# Patient Record
Sex: Male | Born: 1975 | Race: Black or African American | Hispanic: No | Marital: Single
Health system: Midwestern US, Community
[De-identification: ages and names within clinical notes are randomized; demographics above are authoritative.]

## PROBLEM LIST (undated history)

## (undated) DIAGNOSIS — F200 Paranoid schizophrenia: Secondary | ICD-10-CM

## (undated) DIAGNOSIS — G2401 Drug induced subacute dyskinesia: Secondary | ICD-10-CM

## (undated) DIAGNOSIS — Z720 Tobacco use: Secondary | ICD-10-CM

## (undated) DIAGNOSIS — F1210 Cannabis abuse, uncomplicated: Secondary | ICD-10-CM

## (undated) HISTORY — PX: NO PAST SURGERIES: SHX2092

---

## 2014-12-21 ENCOUNTER — Emergency Department: Payer: Self-pay | Admitting: Emergency Medicine

## 2014-12-21 LAB — URINALYSIS, COMPLETE
BACTERIA: NONE SEEN
BILIRUBIN, UR: NEGATIVE
Blood: NEGATIVE
Glucose,UR: NEGATIVE mg/dL (ref 0–75)
KETONE: NEGATIVE
LEUKOCYTE ESTERASE: NEGATIVE
Nitrite: NEGATIVE
Ph: 7 (ref 4.5–8.0)
Protein: NEGATIVE
RBC, UR: NONE SEEN /HPF (ref 0–5)
Specific Gravity: 1.012 (ref 1.003–1.030)
Squamous Epithelial: 1
WBC UR: NONE SEEN /HPF (ref 0–5)

## 2014-12-21 LAB — CBC WITH DIFFERENTIAL/PLATELET
BASOS ABS: 0 10*3/uL (ref 0.0–0.1)
BASOS PCT: 0.7 %
Eosinophil #: 0.1 10*3/uL (ref 0.0–0.7)
Eosinophil %: 2.3 %
HCT: 40.1 % (ref 40.0–52.0)
HGB: 12.6 g/dL — ABNORMAL LOW (ref 13.0–18.0)
Lymphocyte #: 1.7 10*3/uL (ref 1.0–3.6)
Lymphocyte %: 53.7 %
MCH: 28.8 pg (ref 26.0–34.0)
MCHC: 31.5 g/dL — AB (ref 32.0–36.0)
MCV: 92 fL (ref 80–100)
Monocyte #: 0.3 x10 3/mm (ref 0.2–1.0)
Monocyte %: 9.7 %
NEUTROS PCT: 33.6 %
Neutrophil #: 1.1 10*3/uL — ABNORMAL LOW (ref 1.4–6.5)
Platelet: 201 10*3/uL (ref 150–440)
RBC: 4.37 10*6/uL — AB (ref 4.40–5.90)
RDW: 14.8 % — ABNORMAL HIGH (ref 11.5–14.5)
WBC: 3.2 10*3/uL — ABNORMAL LOW (ref 3.8–10.6)

## 2014-12-21 LAB — COMPREHENSIVE METABOLIC PANEL
ANION GAP: 5 — AB (ref 7–16)
Albumin: 3.9 g/dL (ref 3.4–5.0)
Alkaline Phosphatase: 47 U/L
BUN: 12 mg/dL (ref 7–18)
Bilirubin,Total: 0.4 mg/dL (ref 0.2–1.0)
CALCIUM: 9 mg/dL (ref 8.5–10.1)
CO2: 33 mmol/L — AB (ref 21–32)
Chloride: 102 mmol/L (ref 98–107)
Creatinine: 1.08 mg/dL (ref 0.60–1.30)
EGFR (African American): >60
EGFR (Non-African Amer.): >60
Glucose: 105 mg/dL — ABNORMAL HIGH (ref 65–99)
Osmolality: 280 (ref 275–301)
Potassium: 3.7 mmol/L (ref 3.5–5.1)
SGOT(AST): 35 U/L (ref 15–37)
SGPT (ALT): 26 U/L
SODIUM: 140 mmol/L (ref 136–145)
TOTAL PROTEIN: 7.9 g/dL (ref 6.4–8.2)

## 2015-03-10 ENCOUNTER — Ambulatory Visit
Admit: 2015-03-10 | Disposition: A | Discharge status: Home / Self Care | Payer: Self-pay | Attending: Internal Medicine | Admitting: Internal Medicine

## 2015-03-26 ENCOUNTER — Ambulatory Visit
Admit: 2015-03-26 | Disposition: A | Discharge status: Home / Self Care | Payer: Self-pay | Attending: Internal Medicine | Admitting: Internal Medicine

## 2015-09-13 ENCOUNTER — Encounter: Payer: Self-pay | Admitting: Emergency Medicine

## 2015-09-13 ENCOUNTER — Emergency Department
Admission: EM | Admit: 2015-09-13 | Discharge: 2015-09-14 | Disposition: A | Discharge status: Other Acute Inpatient Hospital | Payer: Medicare Other | Attending: Emergency Medicine | Admitting: Emergency Medicine

## 2015-09-13 DIAGNOSIS — F17200 Nicotine dependence, unspecified, uncomplicated: Secondary | ICD-10-CM

## 2015-09-13 DIAGNOSIS — F22 Delusional disorders: Secondary | ICD-10-CM

## 2015-09-13 DIAGNOSIS — F200 Paranoid schizophrenia: Secondary | ICD-10-CM | POA: Diagnosis not present

## 2015-09-13 DIAGNOSIS — Z046 Encounter for general psychiatric examination, requested by authority: Secondary | ICD-10-CM | POA: Diagnosis present

## 2015-09-13 DIAGNOSIS — F1220 Cannabis dependence, uncomplicated: Secondary | ICD-10-CM

## 2015-09-13 DIAGNOSIS — Z720 Tobacco use: Secondary | ICD-10-CM | POA: Insufficient documentation

## 2015-09-13 HISTORY — DX: Paranoid schizophrenia: F20.0

## 2015-09-13 LAB — COMPREHENSIVE METABOLIC PANEL
ALBUMIN: 4.2 g/dL (ref 3.5–5.0)
ALT: 16 U/L — AB (ref 17–63)
AST: 40 U/L (ref 15–41)
Alkaline Phosphatase: 44 U/L (ref 38–126)
Anion gap: 8 (ref 5–15)
BILIRUBIN TOTAL: 0.6 mg/dL (ref 0.3–1.2)
BUN: 13 mg/dL (ref 6–20)
CHLORIDE: 104 mmol/L (ref 101–111)
CO2: 27 mmol/L (ref 22–32)
CREATININE: 0.99 mg/dL (ref 0.61–1.24)
Calcium: 9.4 mg/dL (ref 8.9–10.3)
GFR calc Af Amer: >60 mL/min (ref >60)
GLUCOSE: 101 mg/dL — AB (ref 65–99)
POTASSIUM: 3.8 mmol/L (ref 3.5–5.1)
Sodium: 139 mmol/L (ref 135–145)
TOTAL PROTEIN: 7.9 g/dL (ref 6.5–8.1)

## 2015-09-13 LAB — CBC
HEMATOCRIT: 38.3 % — AB (ref 40.0–52.0)
Hemoglobin: 12.5 g/dL — ABNORMAL LOW (ref 13.0–18.0)
MCH: 29.4 pg (ref 26.0–34.0)
MCHC: 32.7 g/dL (ref 32.0–36.0)
MCV: 89.9 fL (ref 80.0–100.0)
PLATELETS: 241 10*3/uL (ref 150–440)
RBC: 4.26 MIL/uL — AB (ref 4.40–5.90)
RDW: 14.6 % — ABNORMAL HIGH (ref 11.5–14.5)
WBC: 4.6 10*3/uL (ref 3.8–10.6)

## 2015-09-13 LAB — ACETAMINOPHEN LEVEL: Acetaminophen (Tylenol), Serum: <10 ug/mL — ABNORMAL LOW (ref 10–30)

## 2015-09-13 LAB — SALICYLATE LEVEL: Salicylate Lvl: <4 mg/dL (ref 2.8–30.0)

## 2015-09-13 LAB — ETHANOL

## 2015-09-13 NOTE — ED Notes (Signed)
Patient assigned to appropriate care area. Patient oriented to unit/care area: Informed that, for their safety, care areas are designed for safety and monitored by security cameras at all times; and visiting hours explained to patient. Patient verbalizes understanding, and verbal contract for safety obtained.  BEHAVIORAL HEALTH ROUNDING Patient sleeping: No. Patient alert and oriented: yes Behavior appropriate: Yes.   Nutrition and fluids offered: Yes  Toileting and hygiene offered: Yes  Sitter present: q15 min observations Law enforcement present: Yes [employer]  ENVIRONMENTAL ASSESSMENT Potentially harmful objects out of patient reach: Yes.   Personal belongings secured: Yes.   Patient dressed in hospital provided attire only: Yes.   Plastic bags out of patient reach: Yes.   Patient care equipment (cords, cables, call bells, lines, and drains) shortened, removed, or accounted for: Yes.   Equipment and supplies removed from bottom of stretcher: Yes.   Potentially toxic materials out of patient reach: Yes.   Sharps container removed or out of patient reach: Yes.   

## 2015-09-13 NOTE — ED Notes (Signed)
ED tech, Vernona Rieger in triage for protocols

## 2015-09-13 NOTE — ED Notes (Signed)
Per pt he came to the ER tonight "because the group home didn't give me my meds and I am feeling paranoid". Pt does report hearing voices but would not explain what the voices were saying. Pt being belligerent with this nurse while trying to do assessment. Pt went between " I have a bad migraine headache and the group home didn't give me my meds" Pt denies SI, HI, VH, ETOH use. Pt does states " I do smoke some weed".
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
## 2015-09-14 ENCOUNTER — Inpatient Hospital Stay
Admission: EM | Admit: 2015-09-14 | Discharge: 2015-09-20 | DRG: 885 | Disposition: A | Discharge status: Home / Self Care | Payer: Medicare Other | Source: Intra-hospital | Attending: Psychiatry | Admitting: Psychiatry

## 2015-09-14 ENCOUNTER — Encounter: Payer: Self-pay | Admitting: Psychiatry

## 2015-09-14 DIAGNOSIS — Z818 Family history of other mental and behavioral disorders: Secondary | ICD-10-CM | POA: Diagnosis not present

## 2015-09-14 DIAGNOSIS — F22 Delusional disorders: Secondary | ICD-10-CM | POA: Diagnosis present

## 2015-09-14 DIAGNOSIS — F17200 Nicotine dependence, unspecified, uncomplicated: Secondary | ICD-10-CM

## 2015-09-14 DIAGNOSIS — E780 Pure hypercholesterolemia: Secondary | ICD-10-CM | POA: Diagnosis present

## 2015-09-14 DIAGNOSIS — G4700 Insomnia, unspecified: Secondary | ICD-10-CM | POA: Diagnosis present

## 2015-09-14 DIAGNOSIS — F200 Paranoid schizophrenia: Secondary | ICD-10-CM | POA: Diagnosis present

## 2015-09-14 DIAGNOSIS — F17210 Nicotine dependence, cigarettes, uncomplicated: Secondary | ICD-10-CM | POA: Diagnosis present

## 2015-09-14 DIAGNOSIS — R451 Restlessness and agitation: Secondary | ICD-10-CM | POA: Diagnosis present

## 2015-09-14 DIAGNOSIS — R45850 Homicidal ideations: Secondary | ICD-10-CM | POA: Diagnosis present

## 2015-09-14 DIAGNOSIS — F1290 Cannabis use, unspecified, uncomplicated: Secondary | ICD-10-CM | POA: Diagnosis present

## 2015-09-14 DIAGNOSIS — F1220 Cannabis dependence, uncomplicated: Secondary | ICD-10-CM

## 2015-09-14 DIAGNOSIS — Z72811 Adult antisocial behavior: Secondary | ICD-10-CM | POA: Diagnosis not present

## 2015-09-14 DIAGNOSIS — F602 Antisocial personality disorder: Secondary | ICD-10-CM

## 2015-09-14 LAB — URINE DRUG SCREEN, QUALITATIVE (ARMC ONLY)
Amphetamines, Ur Screen: NOT DETECTED
BARBITURATES, UR SCREEN: NOT DETECTED
Benzodiazepine, Ur Scrn: NOT DETECTED
CANNABINOID 50 NG, UR ~~LOC~~: NOT DETECTED
COCAINE METABOLITE, UR ~~LOC~~: NOT DETECTED
MDMA (Ecstasy)Ur Screen: NOT DETECTED
Methadone Scn, Ur: NOT DETECTED
Opiate, Ur Screen: NOT DETECTED
PHENCYCLIDINE (PCP) UR S: NOT DETECTED
TRICYCLIC, UR SCREEN: NOT DETECTED

## 2015-09-14 MED ORDER — NICOTINE 21 MG/24HR TD PT24
21.0000 mg | MEDICATED_PATCH | Freq: Every day | TRANSDERMAL | Status: DC
  Administered 2015-09-14: 21 mg via TRANSDERMAL
  Filled 2015-09-14: qty 1

## 2015-09-14 MED ORDER — MAGNESIUM HYDROXIDE 400 MG/5ML PO SUSP: 30.0000 mL | Freq: Every day | ORAL | Status: DC | PRN

## 2015-09-14 MED ORDER — BENZTROPINE MESYLATE 1 MG PO TABS
1.0000 mg | ORAL_TABLET | Freq: Two times a day (BID) | ORAL | Status: DC
  Administered 2015-09-14: 1 mg via ORAL
  Filled 2015-09-14: qty 1

## 2015-09-14 MED ORDER — ACETAMINOPHEN 325 MG PO TABS
650.0000 mg | ORAL_TABLET | Freq: Four times a day (QID) | ORAL | Status: DC | PRN
  Administered 2015-09-14 – 2015-09-18 (×2): 650 mg via ORAL
  Filled 2015-09-14 (×3): qty 2

## 2015-09-14 MED ORDER — HALOPERIDOL 5 MG PO TABS
5.0000 mg | ORAL_TABLET | Freq: Every morning | ORAL | Status: DC
  Administered 2015-09-15: 5 mg via ORAL
  Filled 2015-09-14: qty 1

## 2015-09-14 MED ORDER — ACETAMINOPHEN 325 MG PO TABS: 650.0000 mg | ORAL_TABLET | Freq: Four times a day (QID) | ORAL | Status: DC | PRN

## 2015-09-14 MED ORDER — BENZTROPINE MESYLATE 1 MG PO TABS
1.0000 mg | ORAL_TABLET | Freq: Two times a day (BID) | ORAL | Status: DC
  Administered 2015-09-14 – 2015-09-20 (×12): 1 mg via ORAL
  Filled 2015-09-14 (×12): qty 1

## 2015-09-14 MED ORDER — HALOPERIDOL 5 MG PO TABS
5.0000 mg | ORAL_TABLET | Freq: Every morning | ORAL | Status: DC
  Administered 2015-09-14: 5 mg via ORAL
  Filled 2015-09-14: qty 1

## 2015-09-14 MED ORDER — HALOPERIDOL 5 MG PO TABS
10.0000 mg | ORAL_TABLET | Freq: Every day | ORAL | Status: DC
  Administered 2015-09-14: 10 mg via ORAL
  Filled 2015-09-14: qty 2

## 2015-09-14 MED ORDER — DIVALPROEX SODIUM 500 MG PO DR TAB
1000.0000 mg | DELAYED_RELEASE_TABLET | Freq: Every day | ORAL | Status: DC
  Administered 2015-09-14 – 2015-09-19 (×6): 1000 mg via ORAL
  Filled 2015-09-14 (×6): qty 2

## 2015-09-14 MED ORDER — ALUM & MAG HYDROXIDE-SIMETH 200-200-20 MG/5ML PO SUSP: 30.0000 mL | ORAL | Status: DC | PRN

## 2015-09-14 MED ORDER — DIVALPROEX SODIUM 500 MG PO DR TAB: 1000.0000 mg | DELAYED_RELEASE_TABLET | Freq: Every day | ORAL | Status: DC

## 2015-09-14 MED ORDER — HALOPERIDOL 5 MG PO TABS: 10.0000 mg | ORAL_TABLET | Freq: Every day | ORAL | Status: DC

## 2015-09-14 NOTE — ED Notes (Signed)
Patient resting comfortably in room. No complaints or concerns voiced. No distress or abnormal behavior noted. Will continue to monitor with security cameras. Q 15 minute rounds continue. 

## 2015-09-14 NOTE — ED Notes (Addendum)
Patient resting comfortably in room. No complaints or concerns voiced. No distress or abnormal behavior noted. Will continue to monitor with security cameras. Q 15 minute rounds continue. 

## 2015-09-14 NOTE — ED Provider Notes (Signed)
-----------------------------------------   4:32 PM on 09/14/2015 -----------------------------------------  Admitted to behavioral health by Dr. Toni Amend with a principal diagnosis of schizophrenia, paranoid type.  Loleta Rose, MD 09/14/15 [phone]

## 2015-09-14 NOTE — ED Notes (Signed)
Patient resting quietly in bed. In no apparent distress. Will maintain on all safety precautions.

## 2015-09-14 NOTE — ED Notes (Signed)

## 2015-09-14 NOTE — ED Notes (Signed)
Patient watching television. Maintained on 15 minute checks and observation by security camera for safety. 

## 2015-09-14 NOTE — ED Notes (Signed)
Report called to RN Raquel D in [facility]

## 2015-09-14 NOTE — ED Notes (Signed)
Report given to Gwen, RN 

## 2015-09-14 NOTE — Progress Notes (Signed)
Pt. is to be admitted to [facility] by Dr. Toni Amend. Attending Physician will be Dr. Ardyth Harps.  Pt. has been assigned to room 307, by [facility] Charge Nurse Gwen.  Intake Paper Work has been signed and placed on pt. chart. ER staff [facility].; Dr. York Cerise, ER MD; Amy  Patient's Nurse & ReneePatient Access) have been made aware of the admission.  09/14/2015 Cheryl Flash, MS,NCC, LPCA

## 2015-09-14 NOTE — ED Notes (Signed)
Patient asleep on initial rounds.

## 2015-09-14 NOTE — ED Notes (Signed)
ACT team visiting with patient.

## 2015-09-14 NOTE — ED Notes (Signed)
Patient resting quietly in bed. States he feels paranoid at group home, hears voices talking about him. Compliant with medications.  Patient reports he does feel safe in the hospital. Will maintain on 15 minute checks and observation by security cameras for safety.
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
## 2015-09-14 NOTE — BHH Group Notes (Signed)
[facility] Group Notes:  (Nursing/MHT/Case Management/Adjunct)  Date:  09/14/2015  Time:  10:41 PM  Type of Therapy:  Group Therapy  Participation Level:  Active  Participation Quality:  Appropriate  Affect:  Appropriate  Cognitive:  Appropriate  Insight:  Appropriate  Engagement in Group:  Engaged  Modes of Intervention:  Discussion  Summary of Progress/Problems:  Burt Ek 09/14/2015, 10:41 PM

## 2015-09-14 NOTE — Tx Team (Signed)
Initial Interdisciplinary Treatment Plan   PATIENT STRESSORS: Educational concerns Substance abuse   PATIENT STRENGTHS: Active sense of humor Motivation for treatment/growth Supportive family/friends   PROBLEM LIST: Problem List/Patient Goals Date to be addressed Date deferred Reason deferred Estimated date of resolution  Schizophrenia/ Paranoid 09/14/15     Subtance Abuse 09/14/15                                                DISCHARGE   CRITERIA:  Ability to meet basic life and health needs Adequate post-discharge living arrangements Medical problems require only outpatient monitoring Safe-care adequate arrangements made  PRELIMINARY DISCHARGE PLAN: Outpatient therapy Return to previous living arrangement  PATIENT/FAMIILY INVOLVEMENT: This treatment plan has been presented to and reviewed with the patient, Peter Wilson, and/or family member,The patient and family have been given the opportunity to ask questions and make suggestions.  Peter Wilson 09/14/2015, 6:34 PM

## 2015-09-14 NOTE — ED Notes (Signed)
Pt. transfered to BHU without incident after report from. Placed in room and oriented to unit. Pt. informed that for their safety all care areas are designed for safety and monitored by security cameras at all times; and visiting hours explained to patient. Patient verbalizes understanding, and verbal contract for safety obtained.   

## 2015-09-14 NOTE — BHH Suicide Risk Assessment (Signed)
[facility] Admission Suicide Risk Assessment   Nursing information obtained from:    Demographic factors:    Current Mental Status:    Loss Factors:    Historical Factors:    Risk Reduction Factors:    Total Time spent with patient: 1 hour Principal Problem: Schizophrenia, paranoid type Diagnosis:   Patient Active Problem List   Diagnosis Date Noted  . Tobacco use disorder [Z72.0] 09/14/2015  . Schizophrenia, paranoid type [F20.0] 09/14/2015  . Cannabis use disorder, moderate, dependence [F12.20] 09/14/2015     Continued Clinical Symptoms:    The "Alcohol Use Disorders Identification Test", Guidelines for Use in Primary Care, Second Edition.  World Science writer [facility]). Score between 0-7:  no or low risk or alcohol related problems. Score between 8-15:  moderate risk of alcohol related problems. Score between 16-19:  high risk of alcohol related problems. Score 20 or above:  warrants further diagnostic evaluation for alcohol dependence and treatment.   CLINICAL FACTORS:   Severe Anxiety and/or Agitation Alcohol/Substance Abuse/Dependencies Schizophrenia:   Paranoid or undifferentiated type Currently Psychotic Previous Psychiatric Diagnoses and Treatments    Psychiatric Specialty Exam: Physical Exam  ROS    COGNITIVE FEATURES THAT CONTRIBUTE TO RISK:  None    SUICIDE RISK:   Moderate:  Frequent suicidal ideation with limited intensity, and duration, some specificity in terms of plans, no associated intent, good self-control, limited dysphoria/symptomatology, some risk factors present, and identifiable protective factors, including available and accessible social support.  PLAN OF CARE: admit to [facility]  Medical Decision Making:  Established Problem, Worsening (2)  I certify that inpatient services furnished can reasonably be expected to improve the patient's condition.   Jimmy Footman 09/14/2015, 3:02 PM

## 2015-09-14 NOTE — ED Provider Notes (Signed)
[facility] Emergency Department Provider Note  ____________________________________________  Time seen: Approximately 2330 PM  I have reviewed the triage vital signs and the nursing notes.   HISTORY  Chief Complaint Mental Health Problem    HPI Peter Wilson is a 39 y.o. male who comes into the hospital with the complaint of being paranoid. He reports that he can be at the group home because people are crazy. He also reports that they have not given him his medications. The patient is supposed to be taking Haldol, Cogentin and medication for cholesterol. The patient reports that he is hearing some voices. He reports that when he told the group home staff that he needed his medications that they lasted him. He reports that he was sent to school without his medications. The group home called and reports that the patient was at another hospital but he reports that he was sent to school. He denies any suicidal or homicidal ideation and he is feeling depressed.   Past Medical History  Diagnosis Date  . Schizophrenia, paranoid    hyperlipidemia  There are no active problems to display for this patient.   History reviewed. No pertinent past surgical history.  No current outpatient prescriptions on file.  Allergies Review of patient's allergies indicates no known allergies.  No family history on file.  Social History Social History  Substance Use Topics  . Smoking status: Current Every Day Smoker -- 2.00 packs/day    Types: Cigarettes  . Smokeless tobacco: None  . Alcohol Use: No    Review of Systems Constitutional: No fever/chills Eyes: No visual changes. ENT: No sore throat. Cardiovascular: Denies chest pain. Respiratory: Denies shortness of breath. Gastrointestinal: No abdominal pain.  No nausea, no vomiting.  No diarrhea.  No constipation. Genitourinary: Negative for dysuria. Musculoskeletal: Negative for back pain. Skin: Negative for  rash. Neurological: Negative for headaches, focal weakness or numbness. Psychiatric:Paranoia  10-point ROS otherwise negative.  ____________________________________________   PHYSICAL EXAM:  VITAL SIGNS: ED Triage Vitals  Enc Vitals Group     BP 09/13/15 1914 117/78 mmHg     Pulse Rate 09/13/15 1914 70     Resp 09/13/15 1914 18     Temp 09/13/15 1914 97.9 F (36.6 C)     Temp Source 09/13/15 1914 Oral     SpO2 09/13/15 1914 98 %     Weight 09/13/15 1914 180 lb (81.647 kg)     Height 09/13/15 1914  (1.854 m)     Head Cir --      Peak Flow --      Pain Score --      Pain Loc --      Pain Edu? --      Excl. in GC? --     Constitutional: Alert and oriented. Well appearing and in no acute distress. Eyes: Conjunctivae are normal. PERRL. EOMI. Head: Atraumatic. Nose: No congestion/rhinnorhea. Mouth/Throat: Mucous membranes are moist.  Oropharynx non-erythematous. Cardiovascular: Normal rate, regular rhythm. Grossly normal heart sounds.  Good peripheral circulation. Respiratory: Normal respiratory effort.  No retractions. Lungs CTAB. Gastrointestinal: Soft and nontender. No distention.  Musculoskeletal: No lower extremity tenderness nor edema.  Neurologic:  Normal speech and language.  Skin:  Skin is warm, dry and intact.  Psychiatric: Mood and affect are normal.   ____________________________________________   LABS (all labs ordered are listed, but only abnormal results are displayed)  Labs Reviewed  COMPREHENSIVE METABOLIC PANEL - Abnormal; Notable for the following:  Glucose, Bld 101 (*)    ALT 16 (*)    All other components within normal limits  ACETAMINOPHEN LEVEL - Abnormal; Notable for the following:    Acetaminophen (Tylenol), Serum <10 (*)    All other components within normal limits  CBC - Abnormal; Notable for the following:    RBC 4.26 (*)    Hemoglobin 12.5 (*)    HCT 38.3 (*)    RDW 14.6 (*)    All other components within normal limits   ETHANOL  SALICYLATE LEVEL  URINE DRUG SCREEN, QUALITATIVE ([facility] ONLY)   ____________________________________________  EKG  None ____________________________________________  RADIOLOGY  None ____________________________________________   PROCEDURES  Procedure(s) performed: None  Critical Care performed: No  ____________________________________________   INITIAL IMPRESSION / ASSESSMENT AND PLAN / ED COURSE  Pertinent labs & imaging results that were available during my care of the patient were reviewed by me and considered in my medical decision making (see chart for details).  This is a 39 year old male with a history of schizophrenia who comes in today with paranoia. The patient reports that he does not want to be at his group home anymore because they are not giving his medications. The patient was seen by the behavioral med team and he will be seen by psych in the morning. The patient will be transferred to the [facility].  Patient will be seen by psych in the morning ____________________________________________   FINAL CLINICAL IMPRESSION(S) / ED DIAGNOSES  Final diagnoses:  Paranoia      Rebecka Apley, MD 09/14/15 [phone]

## 2015-09-14 NOTE — ED Notes (Signed)

## 2015-09-14 NOTE — ED Notes (Signed)
ENVIRONMENTAL ASSESSMENT Potentially harmful objects out of patient reach: Yes.   Personal belongings secured: Yes.   Patient dressed in hospital provided attire only: Yes.   Plastic bags out of patient reach: Yes.   Patient care equipment (cords, cables, call bells, lines, and drains) shortened, removed, or accounted for: Yes.   Equipment and supplies removed from bottom of stretcher: Yes.   Potentially toxic materials out of patient reach: Yes.   Sharps container removed or out of patient reach: Yes.     Patient resting comfortably in room. No complaints or concerns voiced. No distress or abnormal behavior noted. Will continue to monitor with security cameras. Q 15 minute rounds continue.  

## 2015-09-14 NOTE — ED Notes (Signed)
ED BHU PLACEMENT JUSTIFICATION Is the patient under IVC or is there intent for IVC: No. Is the patient medically cleared: Yes.   Is there vacancy in the ED BHU: Yes.   Is the population mix appropriate for patient: Yes.   Is the patient awaiting placement in inpatient or outpatient setting: Yes.   Has the patient had a psychiatric consult: Yes.   Survey of unit performed for contraband, proper placement and condition of furniture, tampering with fixtures in bathroom, shower, and each patient room: Yes.  ; Findings:  APPEARANCE/BEHAVIOR calm, cooperative and adequate rapport can be established NEURO ASSESSMENT Orientation: time, place and person Hallucinations: No.None noted (Hallucinations) Speech: Normal Gait: normal RESPIRATORY ASSESSMENT Normal expansion.  Clear to auscultation.  No rales, rhonchi, or wheezing. CARDIOVASCULAR ASSESSMENT regular rate and rhythm, S1, S2 normal, no murmur, click, rub or gallop GASTROINTESTINAL ASSESSMENT soft, nontender, BS WNL, no r/g EXTREMITIES normal strength, tone, and muscle mass PLAN OF CARE Provide calm/safe environment. Vital signs assessed twice daily. ED BHU Assessment once each 12-hour shift. Collaborate with intake RN daily or as condition indicates. Assure the ED provider has rounded once each shift. Provide and encourage hygiene. Provide redirection as needed. Assess for escalating behavior; address immediately and inform ED provider.  Assess family dynamic and appropriateness for visitation as needed: Yes.  ; If necessary, describe findings:  Educate the patient/family about BHU procedures/visitation: Yes.  ; If necessary, describe findings:  

## 2015-09-14 NOTE — Progress Notes (Signed)
D: Patient admitted on unit under the services of Dr.Hernadez.Patient  Denies suicidal and homicidal behavior. Patient admitted for  Behavior at the group home . Patient stated he is paranoid e above. [facility] Patient stated it makes him feel safe. Patient reports smoking 2 pack cigarettes daily and mariajuana. Patient continue on to say he also drinks  A filth of liq car early assessment patient stated he didn't   A: Pt admitted to unit per protocol, skin assessment and search done and no contraband found.  Pt  educated on therapeutic milieu rules. Pt was introduced to milieu by nursing staff. R: Pt was receptive to education about the milieu .  15 min safety checks started. Clinical research associate offered support

## 2015-09-14 NOTE — ED Notes (Signed)
Patient being seen by inpatient psychiatrist.

## 2015-09-14 NOTE — ED Notes (Signed)
Patient awaiting transfer to inpatient unit. He called his case manager to let him know. Patient is calm and cooperative. Currently he is not expressing any paranoid thoughts or having hallucinations. Maintained on 15 minute checks and observation by security camera for safety.
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
## 2015-09-14 NOTE — BH Assessment (Signed)
Assessment Note  Constantin Hillery is an 39 y.o. male. Mr. Lozinski reports to the ED stating that he is paranoid.  He states that someone is out to get him.  He states that he did not know who was out to get him, but that it could be a lot of people.  He reports to the ED from his group home, from which he does not want to return.  He further stated that he hears voices, "bad voices", but he would not elaborate on what the voices are saying.  He denied suicidal and homicidal ideation or intent. Mr. Sprigg reports that he does not like his group home and he does not want to return there. Mr. Goga currently receives services from [facility] ACT team.  Axis I: Chronic Paranoid Schizophrenia Axis II: Deferred Axis III:  Past Medical History  Diagnosis Date  . Schizophrenia, paranoid    Axis IV: housing problems and other psychosocial or environmental problems Axis V: 31-40 impairment in reality testing  Past Medical History:  Past Medical History  Diagnosis Date  . Schizophrenia, paranoid     History reviewed. No pertinent past surgical history.  Family History: No family history on file.  Social History:  reports that he has been smoking Cigarettes.  He has been smoking about 2.00 packs per day. He does not have any smokeless tobacco history on file. He reports that he uses illicit drugs (Marijuana). He reports that he does not drink alcohol.  Additional Social History:  Alcohol / Drug Use History of alcohol / drug use?: No history of alcohol / drug abuse  CIWA: CIWA-Ar BP: 117/78 mmHg Pulse Rate: 70 COWS:    Allergies: No Known Allergies  Home Medications:  (Not in a hospital admission)  OB/GYN Status:  No LMP for male patient.  General Assessment Data Location of Assessment: [facility] ED TTS Assessment: In system Is this a Tele or Face-to-Face Assessment?: Face-to-Face Is this an Initial Assessment or a Re-assessment for this encounter?: Initial Assessment Marital status:  Single Maiden name: n/a Is patient pregnant?: No Pregnancy Status: No Living Arrangements: Group Home Can pt return to current living arrangement?: Yes Admission Status: Voluntary Is patient capable of signing voluntary admission?: Yes Referral Source: Self/Family/Friend Insurance type: Medicaid  Medical Screening Exam [facility] Walk-in ONLY) Medical Exam completed: Yes  Crisis Care Plan Living Arrangements: Group Home Name of Psychiatrist: [facility] Name of Therapist: [facility]  Education Status Is patient currently in school?: No Current Grade: n/a Highest grade of school patient has completed: 12th Name of school: [employer] person: n/a  Risk to self with the past 6 months Suicidal Ideation: No Has patient been a risk to self within the past 6 months prior to admission? : No Suicidal Intent: No Has patient had any suicidal intent within the past 6 months prior to admission? : No Is patient at risk for suicide?: No Suicidal Plan?: No Has patient had any suicidal plan within the past 6 months prior to admission? : No Access to Means: No What has been your use of drugs/alcohol within the last 12 months?: None reported Previous Attempts/Gestures: No How many times?: 0 Other Self Harm Risks: None reported Triggers for Past Attempts: None known Intentional Self Injurious Behavior: None Family Suicide History: No Recent stressful life event(s): Conflict (Comment) (Conflict at group home) Persecutory voices/beliefs?: No Depression: No Depression Symptoms:  (Denied) Substance abuse history and/or treatment for substance abuse?: No Suicide prevention  information given to non-admitted patients: Not applicable  Risk to Others within the past 6 months Homicidal Ideation: No Does patient have any lifetime risk of violence toward others beyond the six months prior to admission? : No Thoughts of Harm to Others: No Current Homicidal  Intent: No Current Homicidal Plan: No Access to Homicidal Means: No Identified Victim: None reported History of harm to others?: No Assessment of Violence: None Noted Violent Behavior Description: None reported Does patient have access to weapons?: No Criminal Charges Pending?: No Does patient have a court date: No Is patient on probation?: No  Psychosis Hallucinations: Auditory ("Bad Voices") Delusions:  (Paranoid)  Mental Status Report Appearance/Hygiene: In scrubs Eye Contact: Poor Motor Activity: Unremarkable Speech: Unremarkable Level of Consciousness: Alert Mood: Irritable Affect: Irritable Anxiety Level: None Thought Processes: Coherent Judgement: Impaired Orientation: Person, Place, Situation Obsessive Compulsive Thoughts/Behaviors: None  Cognitive Functioning Concentration: Normal Memory: Recent Intact Insight: Fair Impulse Control: Fair Appetite: Good Sleep: No Change Vegetative Symptoms: None  ADLScreening [facility] Assessment Services) Patient's cognitive ability adequate to safely complete daily activities?: Yes Patient able to express need for assistance with ADLs?: Yes Independently performs ADLs?: Yes (appropriate for developmental age)  Prior Inpatient Therapy Prior Inpatient Therapy: No Prior Therapy Dates: n/a  Prior Outpatient Therapy Prior Outpatient Therapy: Yes Prior Therapy Dates: Current Prior Therapy Facilty/Provider(s): [facility] Does patient have an ACCT team?: Yes Does patient have Intensive In-House Services?  : No Does patient have [facility] services? : No Does patient have P4CC services?: No  ADL Screening (condition at time of admission) Patient's cognitive ability adequate to safely complete daily activities?: Yes Patient able to express need for assistance with ADLs?: Yes Independently performs ADLs?: Yes (appropriate for developmental age)       Abuse/Neglect Assessment (Assessment to be complete while patient is  alone) Physical Abuse: Denies Verbal Abuse: Denies Sexual Abuse: Denies Exploitation of patient/patient's resources: Denies Self-Neglect: Denies Values / Beliefs Cultural Requests During Hospitalization: None Spiritual Requests During Hospitalization: None   Advance Directives (For Healthcare) Does patient have an advance directive?: No Would patient like information on creating an advanced directive?: Yes - Educational materials given    Additional Information 1:1 In Past 12 Months?: No CIRT Risk: No Elopement Risk: No Does patient have medical clearance?: Yes     Disposition:  Disposition Initial Assessment Completed for this Encounter: Yes Disposition of Patient:  (To be assessed by the psychiatrist)  On Site Evaluation by:   Reviewed with Physician:    Justice Deeds 09/14/2015 2:51 AM

## 2015-09-14 NOTE — Plan of Care (Signed)
Problem: Ineffective individual coping Goal: STG: Pt will be able to identify effective and ineffective STG: Pt will be able to identify effective and ineffective coping patterns  Outcome: Not Progressing Limited insight

## 2015-09-14 NOTE — ED Notes (Signed)
Pt calm and cooperative without complaints at time.

## 2015-09-14 NOTE — ED Notes (Signed)
Report received from Beth RN. Patient care assumed. Patient/RN introduction complete. Will continue to monitor.  

## 2015-09-15 DIAGNOSIS — F602 Antisocial personality disorder: Secondary | ICD-10-CM

## 2015-09-15 DIAGNOSIS — F200 Paranoid schizophrenia: Principal | ICD-10-CM

## 2015-09-15 LAB — URINALYSIS COMPLETE WITH MICROSCOPIC (ARMC ONLY)
Bacteria, UA: NONE SEEN
Bilirubin Urine: NEGATIVE
Glucose, UA: NEGATIVE mg/dL
HGB URINE DIPSTICK: NEGATIVE
KETONES UR: NEGATIVE mg/dL
LEUKOCYTES UA: NEGATIVE
NITRITE: NEGATIVE
PH: 7 (ref 5.0–8.0)
PROTEIN: NEGATIVE mg/dL
RBC / HPF: NONE SEEN RBC/hpf (ref 0–5)
SPECIFIC GRAVITY, URINE: 1.006 (ref 1.005–1.030)
Squamous Epithelial / LPF: NONE SEEN

## 2015-09-15 LAB — LIPID PANEL
CHOL/HDL RATIO: 4.4 ratio
CHOLESTEROL: 172 mg/dL (ref 0–200)
HDL: 39 mg/dL — ABNORMAL LOW (ref >40)
LDL Cholesterol: 117 mg/dL — ABNORMAL HIGH (ref 0–99)
Triglycerides: 78 mg/dL (ref <150)
VLDL: 16 mg/dL (ref 0–40)

## 2015-09-15 LAB — TSH: TSH: 3.751 u[IU]/mL (ref 0.350–4.500)

## 2015-09-15 LAB — HEMOGLOBIN A1C: HEMOGLOBIN A1C: 5 % (ref 4.0–6.0)

## 2015-09-15 MED ORDER — TRAZODONE HCL 100 MG PO TABS
100.0000 mg | ORAL_TABLET | Freq: Every day | ORAL | Status: DC
  Administered 2015-09-15 – 2015-09-19 (×4): 100 mg via ORAL
  Filled 2015-09-15 (×5): qty 1

## 2015-09-15 MED ORDER — LORAZEPAM 2 MG PO TABS
2.0000 mg | ORAL_TABLET | ORAL | Status: DC | PRN
  Administered 2015-09-18: 2 mg via ORAL
  Filled 2015-09-15: qty 1

## 2015-09-15 MED ORDER — HALOPERIDOL 5 MG PO TABS
5.0000 mg | ORAL_TABLET | Freq: Once | ORAL | Status: AC
  Administered 2015-09-15: 5 mg via ORAL
  Filled 2015-09-15: qty 1

## 2015-09-15 MED ORDER — NICOTINE 21 MG/24HR TD PT24
21.0000 mg | MEDICATED_PATCH | Freq: Every day | TRANSDERMAL | Status: DC
  Administered 2015-09-16 – 2015-09-20 (×4): 21 mg via TRANSDERMAL
  Filled 2015-09-15 (×4): qty 1

## 2015-09-15 MED ORDER — LORAZEPAM 2 MG PO TABS: 2.0000 mg | ORAL_TABLET | Freq: Four times a day (QID) | ORAL | Status: DC | PRN

## 2015-09-15 MED ORDER — HALOPERIDOL 5 MG PO TABS
10.0000 mg | ORAL_TABLET | Freq: Two times a day (BID) | ORAL | Status: DC
  Administered 2015-09-15 – 2015-09-20 (×10): 10 mg via ORAL
  Filled 2015-09-15 (×10): qty 2

## 2015-09-15 MED ORDER — LORAZEPAM 1 MG PO TABS
1.0000 mg | ORAL_TABLET | Freq: Two times a day (BID) | ORAL | Status: DC
  Administered 2015-09-15 – 2015-09-16 (×2): 1 mg via ORAL
  Filled 2015-09-15 (×2): qty 1

## 2015-09-15 MED ORDER — LORAZEPAM 2 MG PO TABS
2.0000 mg | ORAL_TABLET | Freq: Once | ORAL | Status: AC
  Administered 2015-09-15: 2 mg via ORAL
  Filled 2015-09-15: qty 1

## 2015-09-15 NOTE — Progress Notes (Signed)
Recreation Therapy Notes  Date: 09.21.16 Time: 3:00 pm Location: Craft Room  Group Topic: Self-esteem  Goal Area(s) Addresses:  Patient will write at least one positive trait. Patient will verbalize benefit of having a good self-esteem.  Behavioral Response: Left early  Intervention: I Am  Activity: Patients were given a worksheet with the letter I on it and instructed to write as many positive traits about themselves inside the I.  Education: LRT educated patients on ways they can increase their self-esteem.   Education Outcome: Patient left group before LRT educated patients.  Clinical Observations/Feedback: Patient left group at approximately 3:07 pm. Patient did not return to group.  Jacquelynn Cree, LRT/CTRS 09/15/2015 4:48 PM

## 2015-09-15 NOTE — Plan of Care (Signed)
Problem: Alteration in thought process Goal: LTG-Patient has not harmed self or others in at least 2 days Outcome: Progressing Patient denies suicidal ideation and homicidal ideation. Denies self harm or other harm within the past 2 days.

## 2015-09-15 NOTE — Progress Notes (Signed)
D: Patient alert and oriented to person, place, time and situation. Denies suicidal ideation, homicidal ideation, and hallucinations. Patient was pleasant and cooperative this morning. Attended morning groups. Patient became increasingly agitated during afternoon. Was seen pacing up and down the hall, talking to himself out loud. Was cussing and heard saying "I'll kill them", but was not directed at any particular individual. Was fixated on talking to Dr. Jerilee Hoh and his Education officer, museum. Patients hands were observed to have minor tremors. Patient was observed to have attention seeking behavior while on the phone. Asked nurse to tell his mom "go fuck herself".  A: Actively listened to patient. Tried to redirect as patient became agitated. Administered Haldol and Ativan orally. Dr. Jerilee Hoh met with patient.   R: Patient could not be redirected. Patient continued to pace, while verbal outbursts decreased. Patient mood was labile throughout evening, but improved from earlier. Indicated that he felt better and more relaxed. Pt attended recreational group. Continued to observe patient. Continue Q 15 min checks.

## 2015-09-15 NOTE — Plan of Care (Signed)
Problem: Ineffective individual coping Goal: STG: Patient will remain free from self harm Outcome: Progressing Patient remains safe on unit. Q 15 min checks maintained.

## 2015-09-15 NOTE — Progress Notes (Signed)
The patient is cooperative but shows signs of anxiety on assessment. The patient is fidgety. The patient states that he did not like the group home that he was at and plans to go live with his mother in [place]. Patient remains safe on unit. Q 15 min checks maintained. Will continue to monitor.

## 2015-09-15 NOTE — [facility] Group Notes (Signed)
[facility] Group Notes:  (Nursing/MHT/Case Management/Adjunct)  Date:  09/15/2015  Time:  5:21 PM  Type of Therapy:  Psychoeducational Skills  Participation Level:  Active  Participation Quality:  Appropriate, Attentive and Sharing  Affect:  Appropriate  Cognitive:  Alert and Appropriate  Insight:  Appropriate  Engagement in Group:  Engaged  Modes of Intervention:  Discussion, Education and Support  Summary of Progress/Problems:  Peter Wilson [facility] 09/15/2015, 5:21 PM

## 2015-09-15 NOTE — Plan of Care (Signed)
Problem: Alteration in thought process Goal: LTG-Patient verbalizes understanding importance med regimen (Patient verbalizes understanding of importance of medication regimen and need to continue outpatient care.)  Outcome: Progressing Patient was compliant with mediation regiment and voiced intent to continue prescribed medications post-discharge.

## 2015-09-15 NOTE — [facility] Group Notes (Signed)
[facility] [name] Aftercare Discharge Planning Group Note   09/15/2015 12:33 PM  Participation Quality:  Patient attended group and particiapted appropriately sharing his goal is to discharge and work with the doctor. Patient reports his outpatient psychiatrist will visit him today and he looks forward to this.   Mood/Affect:  Appropriate  Thoughts of Suicide:  No Will you contract for safety?   NA  Current AVH:  No  Plan for Discharge/Comments:  CSW will work with patient to develop.   Lulu Riding, MSW, LCSWA

## 2015-09-15 NOTE — Progress Notes (Signed)
Recreation Therapy Notes  INPATIENT RECREATION THERAPY ASSESSMENT  Patient Details Name: Peter Wilson MRN: [id number] DOB: February 24, 1976 Today's Date: 09/15/2015  Patient Stressors: Family, Other (Comment) (People at group home)  Coping Skills:   Arguments, Substance Abuse, Exercise, Music, Sports, Other (Comment) (Fight)  Personal Challenges: Anger, Communication, Concentration, Decision-Making, Expressing Yourself, Problem-Solving, Relationships, Self-Esteem/Confidence, Stress Management, Substance Abuse, Trusting Others  Leisure Interests (2+):  Individual - Other (Comment) (Smoke cigarettes and weed)  Awareness of Community Resources:  Yes  Community Resources:  [employer], [place]  Current Use: Yes  If no, Barriers?:    Patient Strengths:  "I don't know"  Patient Identified Areas of Improvement:  No  Current Recreation Participation:  Smoking weed and cigarettes  Patient Goal for Hospitalization:  To go back home  [place] of Residence:  [place] of Residence:  [name]   Current SI (including self-harm):  No  Current HI:  No  Consent to Intern Participation: N/A   Jacquelynn Cree, LRT/CTRS 09/15/2015, 2:30 PM

## 2015-09-15 NOTE — Progress Notes (Signed)
[facility] MD Progress Note  09/15/2015 3:23 PM Peter Wilson  MRN:  [id number] Subjective: Patient was very agitated this morning stating that we are all trying to mess with him and playing games on him. Patient was using profanity and had to be redirected. Patient was seen in the hallway saying that nobody cared about him. Patient had to receive when necessary's Haldol 5 mg and Ativan 2 mg by mouth. After that the patient was able to calm down.  Patient continues to say he refuses to return to the group home and also refuses to go to a homeless shelter. The family does not allow him to return back to their home in [place]. Patient states he has no money in order to pay for a hotel room.  Patient was angry at the social worker and stated that he was refusing to have any further interaction with her.  I spoke with her M.D. from act team feels that a lot of decisions could be situational as he has been a stable on his current dose of medications for a while.  They report that the patient does have antisocial behavior and that he was involved and illegal activities and with antisocials back when he was living in [place].  Per the report patient appears to have been compliant with medication regimen. In the past attempted to use Depakote at a higher dose calls hyperammonemia.   Principal Problem: Schizophrenia, paranoid type Diagnosis:   Patient Active Problem List   Diagnosis Date Noted  . Tardive dyskinesia [G24.01] 09/15/2015  . Antisocial traits [F60.2] 09/15/2015  . Tobacco use disorder [Z72.0] 09/14/2015  . Schizophrenia, paranoid type [F20.0] 09/14/2015  . Cannabis use disorder, moderate, dependence [F12.20] 09/14/2015   Total Time spent with patient: 30 minutes   Past Medical History:  Past Medical History  Diagnosis Date  . Schizophrenia, paranoid    History reviewed. No pertinent past surgical history. Family History: History reviewed. No pertinent family history. Social History:   History  Alcohol Use No     History  Drug Use  . Yes  . Special: Marijuana    Social History   Social History  . Marital Status: Single    Spouse Name: N/A  . Number of Children: N/A  . Years of Education: N/A   Social History Main Topics  . Smoking status: Current Every Day Smoker -- 2.00 packs/day    Types: Cigarettes  . Smokeless tobacco: None  . Alcohol Use: No  . Drug Use: Yes    Special: Marijuana  . Sexual Activity: Not Asked   Other Topics Concern  . None   Social History Narrative   Additional History:    Sleep: Good  Appetite:  Good   Assessment:   Musculoskeletal: Strength & Muscle Tone: within normal limits Gait & Station: normal Patient leans: N/A   Psychiatric Specialty Exam: Physical Exam  Review of Systems  Constitutional: Negative.   HENT: Negative.   Eyes: Negative.   Respiratory: Negative.   Cardiovascular: Negative.   Gastrointestinal: Negative.   Genitourinary: Negative.   Musculoskeletal: Negative.   Skin: Negative.   Neurological: Negative.   Endo/Heme/Allergies: Negative.   Psychiatric/Behavioral: Negative.     Blood pressure 111/75, pulse 76, temperature 98.5 F (36.9 C), temperature source Oral, resp. rate 20, height  (1.956 m), weight 79.379 kg (175 lb), SpO2 100 %.Body mass index is 20.75 kg/(m^2).  General Appearance: Well Groomed  Patent attorney::  Fair  Speech:  Pressured  Volume:  Increased  Mood:  Irritable  Affect:  Constricted  Thought Process:  Linear  Orientation:  Full (Time, Place, and Person)  Thought Content:  Paranoid Ideation  Suicidal Thoughts:  No  Homicidal Thoughts:  No  Memory:  Immediate;   Fair Recent;   Fair Remote;   Fair  Judgement:  Poor  Insight:  Shallow  Psychomotor Activity:  Increased  Concentration:  NA  Recall:  NA  Fund of Knowledge:Fair  Language: Good  Akathisia:  No  Handed:    AIMS (if indicated):     Assets:  Medical laboratory scientific officer Physical Health  ADL's:  Intact  Cognition: WNL  Sleep:  Number of Hours: 7     Current Medications: Current Facility-Administered Medications  Medication Dose Route Frequency Ziyah Cordoba Last Rate Last Dose  . acetaminophen (TYLENOL) tablet 650 mg  650 mg Oral Q6H PRN Jimmy Footman, MD   650 mg at 09/14/15 2012  . alum & mag hydroxide-simeth (MAALOX/MYLANTA) 200-200-20 MG/5ML suspension 30 mL  30 mL Oral Q4H PRN Jimmy Footman, MD      . benztropine (COGENTIN) tablet 1 mg  1 mg Oral BID Jimmy Footman, MD   1 mg at 09/15/15 1038  . divalproex (DEPAKOTE) DR tablet 1,000 mg  1,000 mg Oral QHS Jimmy Footman, MD   1,000 mg at 09/14/15 2121  . haloperidol (HALDOL) tablet 10 mg  10 mg Oral BID Jimmy Footman, MD      . LORazepam (ATIVAN) tablet 2 mg  2 mg Oral Q4H PRN Jimmy Footman, MD      . magnesium hydroxide (MILK OF MAGNESIA) suspension 30 mL  30 mL Oral Daily PRN Jimmy Footman, MD      . nicotine (NICODERM CQ - dosed in mg/24 hours) patch 21 mg  21 mg Transdermal Daily Jimmy Footman, MD   21 mg at 09/15/15 1306  . traZODone (DESYREL) tablet 100 mg  100 mg Oral QHS Jimmy Footman, MD        Lab Results:  Results for orders placed or performed during the hospital encounter of 09/14/15 (from the past 48 hour(s))  Hemoglobin A1c     Status: None   Collection Time: 09/15/15  7:02 AM  Result Value Ref Range   Hgb A1c MFr Bld 5.0 4.0 - 6.0 %  Lipid panel     Status: Abnormal   Collection Time: 09/15/15  7:02 AM  Result Value Ref Range   Cholesterol 172 0 - 200 mg/dL   Triglycerides 78 <161 mg/dL   HDL 39 (L) >09 mg/dL   Total CHOL/HDL Ratio 4.4 RATIO   VLDL 16 0 - 40 mg/dL   LDL Cholesterol 604 (H) 0 - 99 mg/dL    Comment:        Total Cholesterol/HDL:CHD Risk Coronary Heart Disease Risk Table                     Men   Women  1/2 Average Risk   3.4   3.3   Average Risk       5.0   4.4  2 X Average Risk   9.6   7.1  3 X Average Risk  23.4   11.0        Use the calculated Patient Ratio above and the CHD Risk Table to determine the patient's CHD Risk.        ATP III CLASSIFICATION (LDL):  <100     mg/dL   Optimal  540-981  mg/dL  Near or Above                    Optimal  130-159  mg/dL   Borderline  324-401  mg/dL   High  >027     mg/dL   Very High   TSH     Status: None   Collection Time: 09/15/15  7:02 AM  Result Value Ref Range   TSH 3.751 0.350 - 4.500 uIU/mL    Physical Findings: AIMS: Facial and Oral Movements Muscles of Facial Expression: None, normal Lips and Perioral Area: None, normal Jaw: None, normal Tongue: Minimal,Extremity Movements Upper (arms, wrists, hands, fingers): None, normal Lower (legs, knees, ankles, toes): None, normal, Trunk Movements Neck, shoulders, hips: None, normal, Overall Severity Severity of abnormal movements (highest score from questions above): None, normal Incapacitation due to abnormal movements: None, normal Patient's awareness of abnormal movements (rate only patient's report): No Awareness, Dental Status Current problems with teeth and/or dentures?: Yes Does patient usually wear dentures?: No  CIWA:    COWS:     Treatment Plan Summary: Daily contact with patient to assess and evaluate symptoms and progress in treatment and Medication management   39 year old African-American male who carries a diagnosis of schizophrenia presents to our emergency department earlier today voicing homicidal ideation, paranoia and auditory hallucinations. The patient was agitated towards the group home staff members and made several threats against them. It is unclear whether or not the patient has been compliant with medications as collateral information has been obtained yet.  Schizophrenia: Continue Haldol but will increase it to  10 mg po bid, continue Depakote DR 1000 g by mouth daily at bedtime.  Poor coping has aggravated patient's mood and behavior. He has a very low frustration tolerance it is hard to differentiate between behavioral issues and paranoia.   Agitation: Continue Ativan 2 mg every 4 hours when necessary.  Insomnia: Continue trazodone 100 mg by mouth daily at bedtime when necessary  EPS: continue benztropine 1 mg by mouth twice a day.  Tobacco use disorder: Will continue nicotine patch 21 mg  Precautions every 15 minute checks  Diet regular  Discharge planning: Once stable the patient will be discharged to his act team. Unclear at this point the patient will return back to his group home or if he will have to be placed in a different facility. It is my understanding that ACT team is already looking for new placement.  Labs: TSH wnl, lipid panel shows low HDL and mildly increased LDL.  HBA1c wnl   Medical Decision Making:  Established Problem, Worsening (2)     Jimmy Footman 09/15/2015, 3:23 PM

## 2015-09-15 NOTE — [facility] Group Notes (Signed)
[facility] [name] Group Therapy  09/15/2015 4:21 PM  Type of Therapy:  Group Therapy  Participation Level:  Did Not Attend  Modes of Intervention:  Discussion, Education, Socialization and Support  Summary of Progress/Problems: Pt will identify unhealthy thoughts and how they impact their emotions and behavior. Pt will be encouraged to discuss these thoughts, emotions and behaviors with the group.   Candace L Hyatt MSW, LCSWA  09/15/2015, 4:21 PM  

## 2015-09-16 MED ORDER — TUBERCULIN PPD 5 UNIT/0.1ML ID SOLN
5.0000 [IU] | Freq: Once | INTRADERMAL | Status: AC
  Administered 2015-09-16: 5 [IU] via INTRADERMAL
  Filled 2015-09-16 (×2): qty 0.1

## 2015-09-16 MED ORDER — LORAZEPAM 0.5 MG PO TABS
0.5000 mg | ORAL_TABLET | Freq: Two times a day (BID) | ORAL | Status: DC
  Administered 2015-09-17 – 2015-09-20 (×7): 0.5 mg via ORAL
  Filled 2015-09-16 (×8): qty 1

## 2015-09-16 NOTE — [facility] Group Notes (Signed)
[facility] [name] Group Therapy  09/16/2015 4:24 PM  Type of Therapy:  Group Therapy  Participation Level:  Did not attend    Ingle, Jason T, MSW, LCSWA  09/16/2015, 4:24 PM  

## 2015-09-16 NOTE — Plan of Care (Signed)
Problem: Alteration in thought process Goal: STG-Patient is able to discuss thoughts with staff Outcome: Progressing States "there is lot of things going on,I  Am still anxious.'

## 2015-09-16 NOTE — Progress Notes (Signed)
Lady from the group home called to nurse's station to complain that the patient called the group home & cursed at her.She told staff to tell the patient not to call them again.When staff told this to patient he got mad & said "tell her to stop calling my family.'He was cursing her.

## 2015-09-16 NOTE — Plan of Care (Signed)
Problem: [facility] Participation in Recreation Therapeutic Interventions Goal: STG-Patient will demonstrate improved self esteem by identif STG: Self-Esteem - Within 4 treatment sessions, patient will verbalize at least 5 positive affirmation statements in each of 2 treatment sessions to increase self-esteem post d/c.  Outcome: Progressing Treatment Session 1; Completed 1 out of 2: At approximately 2:10 pm, LRT met with patient in craft room. Patient verbalized 5 positive affirmation statements. Patient reported it felt "pretty good". LRT encouraged patient to continue saying positive affirmation statements.  Leonette Monarch, LRT/CTRS 09.22.16 5:34 pm Goal: STG-Patient will identify at least five coping skills for ** STG: Coping Skills - Within 4 treatment sessions, patient will verbalize at least 5 coping skills for anger in each of 2 treatment sessions to increase anger management skills post d/c.  Outcome: Progressing Treatment Session 1; Completed 1 out of 2: At approximately 2:10 pm, LRT met with patient in craft room. Patient verbalized 5 coping skills for anger. Patient verbalized what triggers him to get angry, and how his body responds to anger. LRT provided suggestions on ways patient can remember to use his healthy coping skills.  Leonette Monarch, LRT/CTRS 09.22.16 5:35 pm Goal: STG-Other Recreation Therapy Goal (Specify) STG: Stress Management - Within 4 treatment sessions, patient will verbalize understanding of the stress management techniques in each of 2 treatment sessions to increase stress management skills post d/c.  Outcome: Progressing Treatment Session 1; Completed 1 out of 2: At approximately 2:10 pm, LRT met with patient in craft room. LRT educated and provided patient with handouts on the stress management techniques. Patient verbalized understanding. LRT encouraged patient to read over and practice the stress management techniques.  Leonette Monarch, LRT/CTRS 09.22.16  5:37 pm

## 2015-09-16 NOTE — Progress Notes (Signed)
D: Pt denies SI/HI/AVH. Pt is pleasant and cooperative. Pt appears less anxious and he is interacting with peers and staff appropriately.  A: Pt was offered support and encouragement. Pt was given scheduled medications. Pt was encouraged to attend groups. Q 15 minute checks were done for safety.  R:Pt attends groups and interacts well with peers and staff. Pt is taking medication. Pt has no complaints.Pt receptive to treatment and safety maintained on unit.   

## 2015-09-16 NOTE — Tx Team (Signed)
Interdisciplinary Treatment Plan Update (Adult)  Date:  09/16/2015 Time Reviewed:  4:36 PM  Progress in Treatment: Attending groups: No. Participating in groups:  No. Taking medication as prescribed:  Yes. Tolerating medication:  Yes. Family/Significant othe contact made:  Yes, individual(s) contacted:  Armen Pickup  Patient understands diagnosis:  Yes. Discussing patient identified problems/goals with staff:  Yes. Medical problems stabilized or resolved:  Yes. Denies suicidal/homicidal ideation: Yes. Issues/concerns per patient self-inventory:  No. Other:  New problem(s) identified: No, Describe:  NA  Discharge Plan or Barriers: Pt plans to find a new group home and follow up with [facility].   Reason for Continuation of Hospitalization: Aggression Homicidal ideation Medication stabilization  Hallucinations   Comments: Patient was very agitated yesterday but today seems much calmer, and cooperative. He has been compliant with oral medications. There has not been episodes of aggression or acting out behavior. Patient has been cooperative with nursing. No evidence over night. Patient denies SI, HI or having auditory or visual hallucinations. Patient continues to state that he will not return back to the group home because he does he will likely hurt the lady that owns the group home. Denies side effects from his medications. Denies physical complaints   Estimated length of stay: Pt will likely d/c on Monday   New goal(s): NA  Review of initial/current patient goals per problem list:   1.  Goal(s): Patient will participate in aftercare plan * Met: Not yet  * Target date: at discharge * As evidenced by: Patient will participate within aftercare plan AEB aftercare provider and housing plan at discharge being identified.   2.  Goal (s): Patient will exhibit decreased depressive symptoms and suicidal ideations. * Met: Not yet  *  Target date: at discharge * As evidenced by:  Patient will utilize self rating of depression at 3 or below and demonstrate decreased signs of depression or be deemed stable for discharge by MD.  3.  Goal (s): Patient will demonstrate decreased symptoms of psychosis. * Met: Not yet  *  Target date: at discharge * As evidenced by: Patient will not endorse signs of psychosis or be deemed stable for discharge by MD.   Attendees: Patient:  Peter Wilson 9/22/20164:36 PM  Family:   9/22/20164:36 PM  Physician: Dr. Jerilee Hoh  9/22/20164:36 PM  Nursing:   Junita Push , RN  9/22/20164:36 PM  Case Manager:   9/22/20164:36 PM  Counselor:   9/22/20164:36 PM  Other:  Bonnye Fava, [place]  9/22/20164:36 PM  Other:  Everitt Amber, [place]  9/22/20164:36 PM  Other:   9/22/20164:36 PM  Other:  9/22/20164:36 PM  Other:  9/22/20164:36 PM  Other:  9/22/20164:36 PM  Other:  9/22/20164:36 PM  Other:  9/22/20164:36 PM  Other:  9/22/20164:36 PM  Other:   9/22/20164:36 PM   Scribe for Treatment Team:   Wray Kearns MSW, [place]  09/16/2015, 4:36 PM

## 2015-09-16 NOTE — Plan of Care (Signed)
Problem: Alteration in thought process Goal: LTG-Patient behavior demonstrates decreased signs psychosis (Patient behavior demonstrates decreased signs of psychosis to the point the patient is safe to return home and continue treatment in an outpatient setting.)  Outcome: Progressing Patient is interacting appropriately, thoughts are organized, speech is non pressured and coherent.

## 2015-09-16 NOTE — Progress Notes (Signed)
He was sleepy & drowsy this morning.Denies suicidal or homicidal ideation and AV hallucination.Stayed in room most of the time.Minimal interactions with peers.Attended some groups.

## 2015-09-16 NOTE — Progress Notes (Signed)
[facility] MD Progress Note  09/16/2015 9:56 AM Peter Wilson  MRN:  [id number] Subjective: Patient was very agitated yesterday but today seems much calmer, and cooperative. He has been compliant with oral medications. There has not been episodes of aggression or acting out behavior. Patient has been cooperative with nursing. No evidence over night.  Patient denies SI, HI or having auditory or visual hallucinations. Patient continues to state that he will not return back to the group home because he does he will likely hurt the lady that owns the group home.  Denies side effects from his medications. Denies physical complaints  Per nursing: D: Pt denies SI/HI/AVH. Pt is pleasant and cooperative. Pt appears less anxious and he is interacting with peers and staff appropriately.  A: Pt was offered support and encouragement. Pt was given scheduled medications. Pt was encouraged to attend groups. Q 15 minute checks were done for safety.  R:Pt attends groups and interacts well with peers and staff. Pt is taking medication. Pt has no complaints.Pt receptive to treatment and safety maintained on unit.   Principal Problem: Schizophrenia, paranoid type Diagnosis:   Patient Active Problem List   Diagnosis Date Noted  . Tardive dyskinesia [G24.01] 09/15/2015  . Antisocial traits [F60.2] 09/15/2015  . Tobacco use disorder [Z72.0] 09/14/2015  . Schizophrenia, paranoid type [F20.0] 09/14/2015  . Cannabis use disorder, moderate, dependence [F12.20] 09/14/2015   Total Time spent with patient: 30 minutes   Past Medical History:  Past Medical History  Diagnosis Date  . Schizophrenia, paranoid    History reviewed. No pertinent past surgical history. Family History: History reviewed. No pertinent family history. Social History:  History  Alcohol Use No     History  Drug Use  . Yes  . Special: Marijuana    Social History   Social History  . Marital Status: Single    Spouse Name: N/A  . Number of  Children: N/A  . Years of Education: N/A   Social History Main Topics  . Smoking status: Current Every Day Smoker -- 2.00 packs/day    Types: Cigarettes  . Smokeless tobacco: None  . Alcohol Use: No  . Drug Use: Yes    Special: Marijuana  . Sexual Activity: Not Asked   Other Topics Concern  . None   Social History Narrative   Additional History:    Sleep: Good  Appetite:  Good   Assessment:   Musculoskeletal: Strength & Muscle Tone: within normal limits Gait & Station: normal Patient leans: N/A   Psychiatric Specialty Exam: Physical Exam   Review of Systems  Constitutional: Negative.   HENT: Negative.   Eyes: Negative.   Respiratory: Negative.   Cardiovascular: Negative.   Gastrointestinal: Negative.   Genitourinary: Negative.   Musculoskeletal: Negative.   Skin: Negative.   Neurological: Negative.   Endo/Heme/Allergies: Negative.   Psychiatric/Behavioral: Negative.     Blood pressure 99/71, pulse 99, temperature 98.3 F (36.8 C), temperature source Oral, resp. rate 20, height  (1.956 m), weight 79.379 kg (175 lb), SpO2 100 %.Body mass index is 20.75 kg/(m^2).  General Appearance: Well Groomed  Patent attorney::  Fair  Speech:  Pressured  Volume:  Increased  Mood:  Irritable  Affect:  Constricted  Thought Process:  Linear  Orientation:  Full (Time, Place, and Person)  Thought Content:  Paranoid Ideation  Suicidal Thoughts:  No  Homicidal Thoughts:  No  Memory:  Immediate;   Fair Recent;   Fair Remote;   Fair  Judgement:  Poor  Insight:  Shallow  Psychomotor Activity:  Increased  Concentration:  NA  Recall:  NA  Fund of Knowledge:Fair  Language: Good  Akathisia:  No  Handed:    AIMS (if indicated):     Assets:  Architect Physical Health  ADL's:  Intact  Cognition: WNL  Sleep:  Number of Hours: 6     Current Medications: Current Facility-Administered Medications  Medication Dose Route  Frequency Provider Last Rate Last Dose  . acetaminophen (TYLENOL) tablet 650 mg  650 mg Oral Q6H PRN Jimmy Footman, MD   650 mg at 09/14/15 2012  . alum & mag hydroxide-simeth (MAALOX/MYLANTA) 200-200-20 MG/5ML suspension 30 mL  30 mL Oral Q4H PRN Jimmy Footman, MD      . benztropine (COGENTIN) tablet 1 mg  1 mg Oral BID Jimmy Footman, MD   1 mg at 09/15/15 2147  . divalproex (DEPAKOTE) DR tablet 1,000 mg  1,000 mg Oral QHS Jimmy Footman, MD   1,000 mg at 09/15/15 2146  . haloperidol (HALDOL) tablet 10 mg  10 mg Oral BID Jimmy Footman, MD   10 mg at 09/15/15 2146  . LORazepam (ATIVAN) tablet 1 mg  1 mg Oral BID Jimmy Footman, MD   1 mg at 09/15/15 2147  . LORazepam (ATIVAN) tablet 2 mg  2 mg Oral Q4H PRN Jimmy Footman, MD      . magnesium hydroxide (MILK OF MAGNESIA) suspension 30 mL  30 mL Oral Daily PRN Jimmy Footman, MD      . nicotine (NICODERM CQ - dosed in mg/24 hours) patch 21 mg  21 mg Transdermal Daily Jimmy Footman, MD   21 mg at 09/15/15 1306  . traZODone (DESYREL) tablet 100 mg  100 mg Oral QHS Jimmy Footman, MD   100 mg at 09/15/15 2147    Lab Results:  Results for orders placed or performed during the hospital encounter of 09/14/15 (from the past 48 hour(s))  Hemoglobin A1c     Status: None   Collection Time: 09/15/15  7:02 AM  Result Value Ref Range   Hgb A1c MFr Bld 5.0 4.0 - 6.0 %  Lipid panel     Status: Abnormal   Collection Time: 09/15/15  7:02 AM  Result Value Ref Range   Cholesterol 172 0 - 200 mg/dL   Triglycerides 78 <295 mg/dL   HDL 39 (L) >62 mg/dL   Total CHOL/HDL Ratio 4.4 RATIO   VLDL 16 0 - 40 mg/dL   LDL Cholesterol 130 (H) 0 - 99 mg/dL    Comment:        Total Cholesterol/HDL:CHD Risk Coronary Heart Disease Risk Table                     Men   Women  1/2 Average Risk   3.4   3.3  Average Risk       5.0   4.4  2 X Average Risk   9.6    7.1  3 X Average Risk  23.4   11.0        Use the calculated Patient Ratio above and the CHD Risk Table to determine the patient's CHD Risk.        ATP III CLASSIFICATION (LDL):  <100     mg/dL   Optimal  865-784  mg/dL   Near or Above                    Optimal  130-159  mg/dL   Borderline  161-096  mg/dL   High  >045     mg/dL   Very High   TSH     Status: None   Collection Time: 09/15/15  7:02 AM  Result Value Ref Range   TSH 3.751 0.350 - 4.500 uIU/mL  Urinalysis complete, with microscopic ([facility] only)     Status: Abnormal   Collection Time: 09/15/15  2:57 PM  Result Value Ref Range   Color, Urine STRAW (A) YELLOW   APPearance CLEAR (A) CLEAR   Glucose, UA NEGATIVE NEGATIVE mg/dL   Bilirubin Urine NEGATIVE NEGATIVE   Ketones, ur NEGATIVE NEGATIVE mg/dL   Specific Gravity, Urine 1.006 1.005 - 1.030   Hgb urine dipstick NEGATIVE NEGATIVE   pH 7.0 5.0 - 8.0   Protein, ur NEGATIVE NEGATIVE mg/dL   Nitrite NEGATIVE NEGATIVE   Leukocytes, UA NEGATIVE NEGATIVE   RBC / HPF NONE SEEN 0 - 5 RBC/hpf   WBC, UA 0-5 0 - 5 WBC/hpf   Bacteria, UA NONE SEEN NONE SEEN   Squamous Epithelial / LPF NONE SEEN NONE SEEN    Physical Findings: AIMS: Facial and Oral Movements Muscles of Facial Expression: None, normal Lips and Perioral Area: None, normal Jaw: None, normal Tongue: Minimal,Extremity Movements Upper (arms, wrists, hands, fingers): None, normal Lower (legs, knees, ankles, toes): None, normal, Trunk Movements Neck, shoulders, hips: None, normal, Overall Severity Severity of abnormal movements (highest score from questions above): None, normal Incapacitation due to abnormal movements: None, normal Patient's awareness of abnormal movements (rate only patient's report): No Awareness, Dental Status Current problems with teeth and/or dentures?: Yes Does patient usually wear dentures?: No  CIWA:    COWS:     Treatment Plan Summary: Daily contact with patient to assess and  evaluate symptoms and progress in treatment and Medication management   39 year old African-American male who carries a diagnosis of schizophrenia presents to our emergency department earlier today voicing homicidal ideation, paranoia and auditory hallucinations. The patient was agitated towards the group home staff members and made several threats against them. It is unclear whether or not the patient has been compliant with medications as collateral information has been obtained yet.  Schizophrenia: Continue Haldol which was increased from 5/10 to 10 mg po bid, continue Depakote DR 1000 g by mouth daily at bedtime. Poor coping has aggravated patient's mood and behavior. He has a very low frustration tolerance it is hard to differentiate between behavioral issues and paranoia.   Agitation: Continue Ativan 2 mg every 4 hours when necessary.  Insomnia: Continue trazodone 100 mg by mouth daily at bedtime when necessary  EPS: continue benztropine 1 mg by mouth twice a day.  Tobacco use disorder: Will continue nicotine patch 21 mg  Precautions every 15 minute checks  Diet regular  Discharge planning: Once stable the patient will be discharged to his act team. ACT team and SW here currently looking for a placement. Most likely patient will be discharged early next week.  If no placement has been found by them patient will likely have to go to the homeless shelter or a hotel.  Labs: TSH wnl, lipid panel shows low HDL and mildly increased LDL.  HBA1c wnl   Medical Decision Making:  Established Problem, Worsening (2)     Jimmy Footman 09/16/2015, 9:56 AM

## 2015-09-16 NOTE — Progress Notes (Signed)
Recreation Therapy Notes  Date: 09.22.16 Time: 3:00 pm Location: Craft Room  Group Topic: Leisure Education  Goal Area(s) Addresses:  Patient will identify activities for each letter of the alphabet. Patient will verbalize ability to integrate positive leisure into life post d/c. Patient will verbalize ability to use leisure as a Associate Professor.  Behavioral Response: Did not attend  Intervention: Leisure Alphabet  Activity: Patients were given a leisure alphabet worksheet and instructed to list healthy leisure activities for each letter of the alphabet.  Education: LRT educated patients on what they needed to participate in leisure activities.   Education Outcome: Patient did not attend group.   Clinical Observations/Feedback: Patient did not attend group.  Jacquelynn Cree, LRT/CTRS 09/16/2015 4:40 PM

## 2015-09-17 MED ORDER — HALOPERIDOL 10 MG PO TABS: 10.0000 mg | ORAL_TABLET | Freq: Two times a day (BID) | ORAL | Status: DC

## 2015-09-17 MED ORDER — BENZTROPINE MESYLATE 1 MG PO TABS: 1.0000 mg | ORAL_TABLET | Freq: Two times a day (BID) | ORAL | Status: DC

## 2015-09-17 MED ORDER — CLONAZEPAM 1 MG PO TABS: 1.0000 mg | ORAL_TABLET | Freq: Every day | ORAL | Status: DC

## 2015-09-17 MED ORDER — DIVALPROEX SODIUM 500 MG PO DR TAB: 1000.0000 mg | DELAYED_RELEASE_TABLET | Freq: Every day | ORAL | Status: DC

## 2015-09-17 NOTE — [facility] Group Notes (Signed)
[facility] Group Notes:  (Nursing/MHT/Case Management/Adjunct)  Date:  09/17/2015  Time:  11:49 AM  Type of Therapy:  Psychoeducational Skills  Participation Level:  Minimal  Participation Quality:  Appropriate  Affect:  Appropriate  Cognitive:  Appropriate  Insight:  Limited  Engagement in Group:  Limited  Modes of Intervention:  Socialization  Summary of Progress/Problems:  Mickey Farber 09/17/2015, 11:49 AM

## 2015-09-17 NOTE — [facility] Group Notes (Signed)
[facility] [name] Aftercare Discharge Planning Group Note   09/17/2015 6:47 PM  Participation Quality:  Did not attend.   Candace L Hyatt MSW, LCSWA     

## 2015-09-17 NOTE — Plan of Care (Signed)
Problem: [facility] Participation in Recreation Therapeutic Interventions Goal: STG-Patient will demonstrate improved self esteem by identif STG: Self-Esteem - Within 4 treatment sessions, patient will verbalize at least 5 positive affirmation statements in each of 2 treatment sessions to increase self-esteem post d/c.  Outcome: Completed/Met Date Met:  09/17/15 Treatment Session 2; Completed 2 out of 2: At approximately 2:35 pm, LRT met with patient in craft room. Patient verbalized 5 positive affirmation statements. Patient reported it felt "pretty good". LRT encouraged patient to continue saying positive affirmation statements.  Leonette Monarch, LRT/CTRS 09.23.16 5:11 pm Goal: STG-Patient will identify at least five coping skills for ** STG: Coping Skills - Within 4 treatment sessions, patient will verbalize at least 5 coping skills for anger in each of 2 treatment sessions to increase anger management skills post d/c.  Outcome: Completed/Met Date Met:  09/17/15 Treatment Session 2; Completed 2 out of 2: At approximately 2:35 pm, LRT met with patient in craft room. Patient verbalized 5 coping skills for anger. LRT encouraged patient to use his coping skills to calm himself down.  Leonette Monarch, LRT/CTRS 09.23.16 5:12 pm Goal: STG-Other Recreation Therapy Goal (Specify) STG: Stress Management - Within 4 treatment sessions, patient will verbalize understanding of the stress management techniques in each of 2 treatment sessions to increase stress management skills post d/c.  Outcome: Completed/Met Date Met:  09/17/15 Treatment Session 2; Completed 2 out of 2: At approximately 2:35 pm, LRT met with patient in craft room. Patient reported he read over the stress management techniques. Patient verbalized understanding. LRT encouraged patient to continue practicing the stress management techniques.  Leonette Monarch, LRT/CTRS 09.23.16 5:13 pm

## 2015-09-17 NOTE — Progress Notes (Signed)
[facility] MD Progress Note  09/17/2015 10:10 AM Peter Wilson  MRN:  [id number] Subjective: Patient was very agitated yesterday but today seems much calmer, and cooperative. He has been compliant with oral medications. There has not been episodes of aggression or acting out behavior. Patient has been cooperative with nursing. No evidence over night.  Patient denies SI, HI or having auditory or visual hallucinations. Patient continues to state that he will not return back to the group home because he does he will likely hurt the lady that owns the group home.  Denies side effects from his medications. Denies physical complaints  Per nursing: Lady from the group home called to nurse's station to complain that the patient called the group home & cursed at her.She told staff to tell the patient not to call them again.When staff told this to patient he got mad & said "tell her to stop calling my family.'He was cursing her.          Principal Problem: Schizophrenia, paranoid type Diagnosis:   Patient Active Problem List   Diagnosis Date Noted  . Tardive dyskinesia [G24.01] 09/15/2015  . Antisocial traits [F60.2] 09/15/2015  . Tobacco use disorder [Z72.0] 09/14/2015  . Schizophrenia, paranoid type [F20.0] 09/14/2015  . Cannabis use disorder, moderate, dependence [F12.20] 09/14/2015   Total Time spent with patient: 30 minutes   Past Medical History:  Past Medical History  Diagnosis Date  . Schizophrenia, paranoid    History reviewed. No pertinent past surgical history. Family History: History reviewed. No pertinent family history. Social History:  History  Alcohol Use No     History  Drug Use  . Yes  . Special: Marijuana    Social History   Social History  . Marital Status: Single    Spouse Name: N/A  . Number of Children: N/A  . Years of Education: N/A   Social History Main Topics  . Smoking status: Current Every Day Smoker -- 2.00 packs/day    Types: Cigarettes  . Smokeless  tobacco: None  . Alcohol Use: No  . Drug Use: Yes    Special: Marijuana  . Sexual Activity: Not Asked   Other Topics Concern  . None   Social History Narrative   Additional History:    Sleep: Good  Appetite:  Good   Assessment:   Musculoskeletal: Strength & Muscle Tone: within normal limits Gait & Station: normal Patient leans: N/A   Psychiatric Specialty Exam: Physical Exam   Review of Systems  Constitutional: Negative.   HENT: Negative.   Eyes: Negative.   Respiratory: Negative.   Cardiovascular: Negative.   Gastrointestinal: Negative.   Genitourinary: Negative.   Musculoskeletal: Negative.   Skin: Negative.   Neurological: Negative.   Endo/Heme/Allergies: Negative.   Psychiatric/Behavioral: Positive for substance abuse. Negative for depression, suicidal ideas and memory loss. The patient is not nervous/anxious and does not have insomnia.     Blood pressure 110/74, pulse 60, temperature 98.1 F (36.7 C), temperature source Oral, resp. rate 20, height  (1.956 m), weight 79.379 kg (175 lb), SpO2 100 %.Body mass index is 20.75 kg/(m^2).  General Appearance: Well Groomed  Patent attorney::  Fair  Speech:  Pressured  Volume:  Increased  Mood:  Irritable  Affect:  Constricted  Thought Process:  Linear  Orientation:  Full (Time, Place, and Person)  Thought Content:  Paranoid Ideation  Suicidal Thoughts:  No  Homicidal Thoughts:  No  Memory:  Immediate;   Fair Recent;   Fair Remote;  Fair  Judgement:  Poor  Insight:  Shallow  Psychomotor Activity:  Increased  Concentration:  NA  Recall:  NA  Fund of Knowledge:Fair  Language: Good  Akathisia:  No  Handed:    AIMS (if indicated):     Assets:  Architect Physical Health  ADL's:  Intact  Cognition: WNL  Sleep:  Number of Hours: 8     Current Medications: Current Facility-Administered Medications  Medication Dose Route Frequency Provider Last Rate Last Dose   . acetaminophen (TYLENOL) tablet 650 mg  650 mg Oral Q6H PRN Jimmy Footman, MD   650 mg at 09/14/15 2012  . alum & mag hydroxide-simeth (MAALOX/MYLANTA) 200-200-20 MG/5ML suspension 30 mL  30 mL Oral Q4H PRN Jimmy Footman, MD      . benztropine (COGENTIN) tablet 1 mg  1 mg Oral BID Jimmy Footman, MD   1 mg at 09/17/15 0932  . divalproex (DEPAKOTE) DR tablet 1,000 mg  1,000 mg Oral QHS Jimmy Footman, MD   1,000 mg at 09/16/15 2201  . haloperidol (HALDOL) tablet 10 mg  10 mg Oral BID Jimmy Footman, MD   10 mg at 09/17/15 0932  . LORazepam (ATIVAN) tablet 0.5 mg  0.5 mg Oral BID Jimmy Footman, MD   0.5 mg at 09/17/15 0932  . LORazepam (ATIVAN) tablet 2 mg  2 mg Oral Q4H PRN Jimmy Footman, MD      . magnesium hydroxide (MILK OF MAGNESIA) suspension 30 mL  30 mL Oral Daily PRN Jimmy Footman, MD      . nicotine (NICODERM CQ - dosed in mg/24 hours) patch 21 mg  21 mg Transdermal Daily Jimmy Footman, MD   21 mg at 09/17/15 0952  . traZODone (DESYREL) tablet 100 mg  100 mg Oral QHS Jimmy Footman, MD   100 mg at 09/15/15 2147  . tuberculin injection 5 Units  5 Units Intradermal Once Jimmy Footman, MD   5 Units at 09/16/15 1458    Lab Results:  Results for orders placed or performed during the hospital encounter of 09/14/15 (from the past 48 hour(s))  Urinalysis complete, with microscopic ([facility] only)     Status: Abnormal   Collection Time: 09/15/15  2:57 PM  Result Value Ref Range   Color, Urine STRAW (A) YELLOW   APPearance CLEAR (A) CLEAR   Glucose, UA NEGATIVE NEGATIVE mg/dL   Bilirubin Urine NEGATIVE NEGATIVE   Ketones, ur NEGATIVE NEGATIVE mg/dL   Specific Gravity, Urine 1.006 1.005 - 1.030   Hgb urine dipstick NEGATIVE NEGATIVE   pH 7.0 5.0 - 8.0   Protein, ur NEGATIVE NEGATIVE mg/dL   Nitrite NEGATIVE NEGATIVE   Leukocytes, UA NEGATIVE NEGATIVE   RBC / HPF  NONE SEEN 0 - 5 RBC/hpf   WBC, UA 0-5 0 - 5 WBC/hpf   Bacteria, UA NONE SEEN NONE SEEN   Squamous Epithelial / LPF NONE SEEN NONE SEEN    Physical Findings: AIMS: Facial and Oral Movements Muscles of Facial Expression: None, normal Lips and Perioral Area: None, normal Jaw: None, normal Tongue: Minimal,Extremity Movements Upper (arms, wrists, hands, fingers): None, normal Lower (legs, knees, ankles, toes): None, normal, Trunk Movements Neck, shoulders, hips: None, normal, Overall Severity Severity of abnormal movements (highest score from questions above): None, normal Incapacitation due to abnormal movements: None, normal Patient's awareness of abnormal movements (rate only patient's report): No Awareness, Dental Status Current problems with teeth and/or dentures?: Yes Does patient usually wear dentures?: No  CIWA:  COWS:     Treatment Plan Summary: Daily contact with patient to assess and evaluate symptoms and progress in treatment and Medication management   39 year old African-American male who carries a diagnosis of schizophrenia presents to our emergency department earlier today voicing homicidal ideation, paranoia and auditory hallucinations. The patient was agitated towards the group home staff members and made several threats against them. It is unclear whether or not the patient has been compliant with medications as collateral information has been obtained yet.  Schizophrenia: Continue Haldol which was increased from 5/10 to 10 mg po bid, continue Depakote DR 1000 g by mouth daily at bedtime. Poor coping has aggravated patient's mood and behavior. He has a very low frustration tolerance it is hard to differentiate between behavioral issues and paranoia.   Agitation: Continue Ativan 0.5 mg po bid and  2 mg every 4 hours when necessary  Insomnia: Continue trazodone 100 mg by mouth daily at bedtime when necessary  EPS: continue benztropine 1 mg by mouth twice a  day.  Tobacco use disorder: Will continue nicotine patch 21 mg  Precautions every 15 minute checks  Diet regular  Discharge planning: Patient was visited by a potential group home that has an opening. Per patient reports the group home told him there will taking on Monday. Social worker is to follow up with them to confirm this information.  Labs: TSH wnl, lipid panel shows low HDL and mildly increased LDL.  HBA1c wnl   Medical Decision Making:  Established Problem, Worsening (2)     Hernandez-Gonzalez,  Andrea 09/17/2015, 10:10 AM

## 2015-09-17 NOTE — Progress Notes (Signed)
D: Patient denies SI/HI/AVH.  Patient affect is sad and her mood is depressed.  Patient did NOT attend evening group. Patient was lethargic on assessment. Patient remained in his room throughout the shift. A: Support and encouragement offered. Scheduled Ativan and Trazodone held. Q 15 min checks continued for patient safety. R: Patient receptive. Patient remains safe on the unit.

## 2015-09-17 NOTE — Progress Notes (Signed)
Recreation Therapy Notes  Date: 09.23.16 Time: 3:00 pm Location: Craft Room  Group Topic: Problem solving, communication, teamwork  Goal Area(s) Addresses:  Patient will effectively work with peer towards shared goal. Patient will identify skills used to make activity successful. Patient will identify benefit of using group skills effectively post d/c.  Behavioral Response: Arrived late  Intervention: [employer]  Activity: Patients were divided up into two groups. Patients were instructed to build a free standing tower out of 15 pipe cleaners. After about 5 minutes of patients building, patients were instructed to put their dominant hand behind their back. After another 5 minutes, patients were instructed not to talk to each other.  Education: LRT educated patients on the importance of communication, problem solving, and teamwork   Education Outcome: In group clarification offered  Clinical Observations/Feedback: Patient left group at approximately 3:05 pm. Patient returned to group at approximately 3:46 pm. Patient did not contribute to group discussion.  Jacquelynn Cree, LRT/CTRS 09/17/2015 4:50 PM

## 2015-09-17 NOTE — Progress Notes (Signed)
Patient ID: Peter Wilson, male   DOB: 1976/02/01, 39 y.o.   MRN: [id number]  Peter Wilson has been accepted at [facility] once he is discharged. The PASRR number should be available early next week.   Daisy Floro Hyatt MSW, LCSWA 09/17/2015 6:11 PM

## 2015-09-17 NOTE — B[facility] Counselor (Signed)
Adult Comprehensive Assessment  Patient ID: Peter Wilson, male   DOB: 06/05/1976, 39 y.o.   MRN: [id number]  Information Source: Information source: Patient  Current Stressors:  Educational / Learning stressors: None reported  Employment / Job issues: Unemployed but recieves SSDI Family Relationships: Family lives in [place]. [place]  Financial / Lack of resources (include bankruptcy): Limited income. Housing / Lack of housing: Pt decided to leave group home due to an argument.  Physical health (include injuries & life threatening diseases): None reported  Social relationships: None reported  Substance abuse: None reported  Bereavement / Loss: None reported   Living/Environment/Situation:  Living Arrangements: Group Home Living conditions (as described by patient or guardian): "Steward Drone said some messed up shit to me. I can't forgive that."  How long has patient lived in current situation?: 1 months  What is atmosphere in current home: Chaotic   Family History:  Marital status: Single Does patient have children?: No  Childhood History:  By whom was/is the patient raised?: Parents  Description of patient's relationship with caregiver when they were a child: "ok"  Patient's description of current relationship with people who raised him/her: "Good"  Does patient have siblings?: Yes Number of Siblings: 2 Description of patient's current relationship with siblings: Siblings live in [place].  Did patient suffer any verbal/emotional/physical/sexual abuse as a child?: No. (Pt refused to answer)  Did patient suffer from severe childhood neglect?: No Has patient ever been sexually abused/assaulted/raped as an adolescent or adult?: No Was the patient ever a victim of a crime or a disaster?: No  Witnessed domestic violence?: No Has patient been effected by domestic violence as an adult?: No  Education:  Highest grade of school patient has completed: GED Currently a Consulting civil engineer?:  No Learning disability?: No  Employment/Work Situation:   Employment situation: On disability Why is patient on disability: Schizophernia  How long has patient been on disability: "for a while"  Patient's job has been impacted by current illness: No What is the longest time patient has a held a job?: Pt refused to answer  Where was the patient employed at that time?: NA  Has patient ever been in the [employer]?: No  Financial Resources:   Surveyor, quantity resources: Insurance claims handler Does patient have a Lawyer or guardian?: No   Alcohol/Substance Abuse:   What has been your use of drugs/alcohol within the last 12 months?: None reported  Alcohol/Substance Abuse Treatment Hx: Denies past history Has alcohol/substance abuse ever caused legal problems?: No  Social Support System:   Conservation officer, nature Support System: Fair Museum/gallery exhibitions officer System: family and [facility]   Type of faith/religion: Christainity  How does patient's faith help to cope with current illness?: Prayer   Leisure/Recreation:   Leisure and Hobbies: unwilling to answer   Strengths/Needs:   What things does the patient do well?: unwilling to answer  In what areas does patient struggle / problems for patient: Unwilling to answer   Discharge Plan:   Does patient have access to transportation?: Yes Will patient be returning to same living situation after discharge?: No Plan for living situation after discharge: New group home  Currently receiving community mental health services: Yes (From Whom) Jeannetta Ellis [facility]) Does patient have financial barriers related to discharge medications?: No  Summary/Recommendations:  Peter Wilson is a 39 year old male who presented to [facility] with paranoia and AH. He has a history of Schizophrenia and psychiatric hospitalizations. He reports getting into and argument with a staff member at his group home.  He states "she said some messed up shit to me. I can't forgive that." He states  that he cannot return there or he will "get into it" with the staff members. He made threats to harm everyone at the group home. He voluntarily signed discharge papers from the group home. He is connected to [facility] and they are searching for placement. During assessment, he was irritable and refused to talk about what exactly happened at his group home. Recommendations include; crisis stabilization, medication management, therapeutic milieu, and encourage group attendance and participation.   [employer] MSW, LCSWA . 09/17/2015

## 2015-09-18 NOTE — B[facility] Group Notes (Signed)
[facility] Group Notes:  (Nursing/MHT/Case Management/Adjunct)  Date:  09/18/2015  Time:  9:34 PM  Type of Therapy:  Evening Wrap-up Group  Participation Level:  Active  Participation Quality:  Appropriate  Affect:  Appropriate  Cognitive:  Alert  Insight:  Good  Engagement in Group:  Engaged  Modes of Intervention:  Discussion  Summary of Progress/Problems:  Peter Wilson 09/18/2015, 9:34 PM

## 2015-09-18 NOTE — Progress Notes (Signed)
[facility] MD Progress Note  09/18/2015 1:45 PM [name]  MRN:  [id number]  Subjective:  Peter Wilson seems to be calm and today. He is asking about discharge and apparently does not mind returning to the group home. Yesterday he reportedly called his group home to curse the owner. He denies any symptoms of depression, anxiety, or psychosis. Sleep and appetite are good. There are no somatic complaints. There is some group participation.  Principal Problem: Schizophrenia, paranoid type Diagnosis:   Patient Active Problem List   Diagnosis Date Noted  . Tardive dyskinesia [G24.01] 09/15/2015  . Antisocial traits [F60.2] 09/15/2015  . Tobacco use disorder [Z72.0] 09/14/2015  . Schizophrenia, paranoid type [F20.0] 09/14/2015  . Cannabis use disorder, moderate, dependence [F12.20] 09/14/2015   Total Time spent with patient: 20 minutes   Past Medical History:  Past Medical History  Diagnosis Date  . Schizophrenia, paranoid    History reviewed. No pertinent past surgical history. Family History: History reviewed. No pertinent family history. Social History:  History  Alcohol Use No     History  Drug Use  . Yes  . Special: Marijuana    Social History   Social History  . Marital Status: Single    Spouse Name: N/A  . Number of Children: N/A  . Years of Education: N/A   Social History Main Topics  . Smoking status: Current Every Day Smoker -- 2.00 packs/day    Types: Cigarettes  . Smokeless tobacco: None  . Alcohol Use: No  . Drug Use: Yes    Special: Marijuana  . Sexual Activity: Not Asked   Other Topics Concern  . None   Social History Narrative   Additional History:    Sleep: Good  Appetite:  Good   Assessment:   Musculoskeletal: Strength & Muscle Tone: within normal limits Gait & Station: normal Patient leans: N/A   Psychiatric Specialty Exam: Physical Exam  Nursing note and vitals reviewed.   Review of Systems  All other systems reviewed and are  negative.   Blood pressure 90/60, pulse 85, temperature 98 F (36.7 C), temperature source Oral, resp. rate 20, height  (1.956 m), weight 79.379 kg (175 lb), SpO2 100 %.Body mass index is 20.75 kg/(m^2).  General Appearance: Casual  Eye Contact::  Fair  Speech:  Clear and Coherent  Volume:  Normal  Mood:  Euthymic  Affect:  Flat  Thought Process:  Goal Directed  Orientation:  Full (Time, Place, and Person)  Thought Content:  WDL  Suicidal Thoughts:  No  Homicidal Thoughts:  No  Memory:  Immediate;   Fair Recent;   Fair Remote;   Fair  Judgement:  Impaired  Insight:  Shallow  Psychomotor Activity:  Normal  Concentration:  Fair  Recall:  [employer] of Knowledge:Fair  Language: Fair  Akathisia:  No  Handed:  Right  AIMS (if indicated):     Assets:  Communication Skills Desire for Improvement Financial Resources/Insurance Physical Health Resilience  ADL's:  Intact  Cognition: WNL  Sleep:  Number of Hours: 6.5     Current Medications: Current Facility-Administered Medications  Medication Dose Route Frequency Provider Last Rate Last Dose  . acetaminophen (TYLENOL) tablet 650 mg  650 mg Oral Q6H PRN Jimmy Footman, MD   650 mg at 09/14/15 2012  . alum & mag hydroxide-simeth (MAALOX/MYLANTA) 200-200-20 MG/5ML suspension 30 mL  30 mL Oral Q4H PRN Jimmy Footman, MD      . benztropine (COGENTIN) tablet 1 mg  1  mg Oral BID Jimmy Footman, MD   1 mg at 09/18/15 1223  . divalproex (DEPAKOTE) DR tablet 1,000 mg  1,000 mg Oral QHS Jimmy Footman, MD   1,000 mg at 09/17/15 2123  . haloperidol (HALDOL) tablet 10 mg  10 mg Oral BID Jimmy Footman, MD   10 mg at 09/18/15 1223  . LORazepam (ATIVAN) tablet 0.5 mg  0.5 mg Oral BID Jimmy Footman, MD   0.5 mg at 09/18/15 1223  . LORazepam (ATIVAN) tablet 2 mg  2 mg Oral Q4H PRN Jimmy Footman, MD      . magnesium hydroxide (MILK OF MAGNESIA) suspension 30 mL   30 mL Oral Daily PRN Jimmy Footman, MD      . nicotine (NICODERM CQ - dosed in mg/24 hours) patch 21 mg  21 mg Transdermal Daily Jimmy Footman, MD   21 mg at 09/17/15 0952  . traZODone (DESYREL) tablet 100 mg  100 mg Oral QHS Jimmy Footman, MD   100 mg at 09/17/15 2123  . tuberculin injection 5 Units  5 Units Intradermal Once Jimmy Footman, MD   5 Units at 09/16/15 1458    Lab Results: No results found for this or any previous visit (from the past 48 hour(s)).  Physical Findings: AIMS: Facial and Oral Movements Muscles of Facial Expression: None, normal Lips and Perioral Area: None, normal Jaw: None, normal Tongue: Minimal,Extremity Movements Upper (arms, wrists, hands, fingers): None, normal Lower (legs, knees, ankles, toes): None, normal, Trunk Movements Neck, shoulders, hips: None, normal, Overall Severity Severity of abnormal movements (highest score from questions above): None, normal Incapacitation due to abnormal movements: None, normal Patient's awareness of abnormal movements (rate only patient's report): No Awareness, Dental Status Current problems with teeth and/or dentures?: Yes Does patient usually wear dentures?: No  CIWA:    COWS:     Treatment Plan Summary: Daily contact with patient to assess and evaluate symptoms and progress in treatment and Medication management   Medical Decision Making:  Established Problem, Stable/Improving (1), Review of Psycho-Social Stressors (1), Review or order clinical lab tests (1), Review of Medication Regimen & Side Effects (2) and Review of New Medication or Change in Dosage (47)   39 year old African-American male who carries a diagnosis of schizophrenia presents to our emergency department earlier today voicing homicidal ideation, paranoia and auditory hallucinations. The patient was agitated towards the group home staff members and made several threats against them. It is unclear  whether or not the patient has been compliant with medications as collateral information has been obtained yet.  Schizophrenia: Continue Haldol which was increased from 5/10 to 10 mg po bid, continue Depakote DR 1000 g by mouth daily at bedtime. Poor coping has aggravated patient's mood and behavior. He has a very low frustration tolerance it is hard to differentiate between behavioral issues and paranoia.   Agitation: Continue Ativan 0.5 mg po bid and 2 mg every 4 hours when necessary  Insomnia: Continue trazodone 100 mg by mouth daily at bedtime when necessary  EPS: continue benztropine 1 mg by mouth twice a day.  Tobacco use disorder: Will continue nicotine patch 21 mg  Precautions every 15 minute checks  Diet regular  Discharge planning: Patient was visited by a potential group home that has an opening. Per patient reports the group home told him there will taking on Monday. Social worker is to follow up with them to confirm this information.  Labs: TSH wnl, lipid panel shows low HDL and mildly increased LDL.  HBA1c wnl  9/24 No medication changes were offered. Supportive therapy provided     Delayna Sparlin 09/18/2015, 1:45 PM

## 2015-09-18 NOTE — B[facility] Group Notes (Signed)
[facility] Group Notes:  (Nursing/MHT/Case Management/Adjunct)  Date:  09/18/2015  Time:  3:33 AM  Type of Therapy:  Group Therapy  Participation Level:  Active  Participation Quality:  Appropriate  Affect:  Appropriate  Cognitive:  Appropriate  Insight:  Appropriate  Engagement in Group:  Engaged  Modes of Intervention:  n/a  Summary of Progress/Problems:  Peter Wilson 09/18/2015, 3:33 AM

## 2015-09-18 NOTE — Plan of Care (Signed)
Problem: Ineffective individual coping Goal: STG: Patient will remain free from self harm Outcome: Progressing Pt safe on unit at this time.      

## 2015-09-18 NOTE — Progress Notes (Signed)
D: Pt denies SI/HI/AVH. Pt is pleasant and cooperative. Pt was concerned about taking Ativan because he said it made him tired all day yesterday. Pt observed interacting with peers and staff on the unit.   A: Pt was offered support and encouragement. Pt was given scheduled medications. Pt was encourage to attend groups. Q 15 minute checks were done for safety.   R:Pt attends groups and interacts well with peers and staff. Pt is taking medication. Pt has no complaints.Pt receptive to treatment and safety maintained on unit.

## 2015-09-18 NOTE — Progress Notes (Signed)
D: Pt denies SI/HI/AVH. Pt is pleasant and cooperative. Affect bright with interaction. . Pt observed interacting with peers and staff on the unit.   A: Pt was offered support and encouragement. Pt was given scheduled medications. Pt was encourage to attend groups. Q 15 minute checks were done for safety.   R:Pt attends groups and interacts well with peers and staff. Pt is taking medication. Pt has no complaints.Pt receptive to treatment and safety maintained on unit.

## 2015-09-18 NOTE — [facility] Group Notes (Signed)
[facility] [name] Group Therapy  09/18/2015 4:00 PM  Type of Therapy:  Group Therapy  Participation Level:  Active  Participation Quality:  Monopolizing  Affect:  Appropriate  Cognitive:  Alert  Insight:  Limited  Engagement in Therapy:  Distracting  Modes of Intervention:  Discussion, Education, Socialization and Support  Summary of Progress/Problems:Balance in life: Patients will discuss the concept of balance and how it looks and feels to be unbalanced. Pt will identify areas in their life that is unbalanced and ways to become more balanced.  Peter Wilson discussed past relationships that were unhealthy and the choices he has made to remove himself from those.   Peter Wilson  MSW, LCSWA   09/18/2015, 4:00 PM

## 2015-09-19 MED ORDER — PNEUMOCOCCAL VAC POLYVALENT 25 MCG/0.5ML IJ INJ: 0.5000 mL | INJECTION | INTRAMUSCULAR | Status: DC

## 2015-09-19 MED ORDER — INFLUENZA VAC SPLIT QUAD 0.5 ML IM SUSY
0.5000 mL | PREFILLED_SYRINGE | INTRAMUSCULAR | Status: AC
Start: 2015-09-20 — End: 2015-09-20
  Administered 2015-09-20: 0.5 mL via INTRAMUSCULAR
  Filled 2015-09-19: qty 0.5

## 2015-09-19 NOTE — Progress Notes (Signed)
D: Patient denies SI/HI/AVH.  Patient affect and mood are anxious.  Patient did attend evening group. Patient visible on the milieu. No distress noted. A: Support and encouragement offered. Scheduled medications given to pt. Q 15 min checks continued for patient safety. R: Patient receptive. Patient remains safe on the unit.   

## 2015-09-19 NOTE — [facility] Group Notes (Signed)
[facility] [name] Group Therapy  09/19/2015 3:09 PM  Type of Therapy:  Group Therapy  Participation Level:  Did Not Attend  Modes of Intervention:  Discussion, Education, Socialization and Support  Summary of Progress/Problems: Communications: Patients identify how individuals communicate with one another appropriately and inappropriately. Patients will be guided to discuss their thoughts, feelings, and behaviors related to barriers when communicating. The group will process together ways to execute positive and appropriate communications   Candace L Hyatt MSW, LCSWA  09/19/2015, 3:09 PM 

## 2015-09-19 NOTE — Progress Notes (Addendum)
D: Pt denies SI/HI/AVH. Pt is pleasant and cooperative. Affect bright with interaction. . Pt observed interacting with peers and staff on the unit. Pt's PPD was read at this time.  No induration was noted.   A: Pt was offered support and encouragement. Pt was given scheduled medications. Pt was encourage to attend groups. Q 15 minute checks were done for safety.   R:Pt attends groups and interacts well with peers and staff. Pt is taking medication. Pt has no complaints.Pt receptive to treatment and safety maintained on unit.

## 2015-09-20 DIAGNOSIS — F200 Paranoid schizophrenia: Secondary | ICD-10-CM | POA: Diagnosis not present

## 2015-09-20 LAB — VALPROIC ACID LEVEL: VALPROIC ACID LVL: 46 ug/mL — AB (ref 50.0–100.0)

## 2015-09-20 MED ORDER — HALOPERIDOL DECANOATE 100 MG/ML IM SOLN: 200.0000 mg | INTRAMUSCULAR | Status: DC

## 2015-09-20 NOTE — [facility] Group Notes (Signed)
[facility] Group Notes:  (Nursing/MHT/Case Management/Adjunct)  Date:  09/20/2015  Time:  2:56 PM  Type of Therapy:  Psychoeducational Skills  Participation Level:  Active  Participation Quality:  Appropriate, Attentive and Sharing  Affect:  Appropriate  Cognitive:  Alert and Appropriate  Insight:  Appropriate and Good  Engagement in Group:  Engaged  Modes of Intervention:  Discussion, Education and Support  Summary of Progress/Problems:  Peter Wilson 09/20/2015, 2:56 PM

## 2015-09-20 NOTE — Progress Notes (Signed)
Recreation Therapy Notes  Date: 09.26.16 Time: 3:00 pm Location: Craft Room  Group Topic: Self-expression  Goal Area(s) Addresses:  Patient will identify one color per emotion listed on wheel. Patient will verbalize benefit of using art as a means of self-expression. Patient will verbalize one emotion experienced during session. Patient will be educated on other forms of self-expression.  Behavioral Response: Attentive, Left early  Intervention: Emotion Wheel  Activity: Patients were given a worksheet with 7 different emotions and instructed to pick a color for each emotion.  Education: LRT educated patients on other forms of self-expression.  Education Outcome: Patient left group before LRT educated patients.  Clinical Observations/Feedback: Patient completed activity by picking a color for each emotion. Patient left group at approximately 3:21 pm to use the restroom. Patient did not return to group.  Jacquelynn Cree, LRT/CTRS 09/20/2015 4:48 PM

## 2015-09-20 NOTE — Progress Notes (Signed)
D: Pt denies SI/HI/AVH. Pt is pleasant and cooperative. Pt said  he was getting used to the medications, pt was not feeling so sleepy during the day. Pt ready to go to the Group home. Pt stated he may move to [place] with his mother, "I just want to do right"  A: Pt was offered support and encouragement. Pt was given scheduled medications. Pt was encourage to attend groups. Q 15 minute checks were done for safety.   R:Pt attends groups and interacts well with peers and staff. Pt is taking medication. Pt has no complaints at this time.Pt receptive to treatment and safety maintained on unit.
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
## 2015-09-20 NOTE — [facility] Suicide Risk Assessment (Signed)
[facility] INPATIENT:  Family/Significant Other Suicide Prevention Education  Suicide Prevention Education:  Education Completed; Group Home Staff,  (name of family member/significant other) has been identified by the patient as the family member/significant other with whom the patient will be residing, and identified as the person(s) who will aid the patient in the event of a mental health crisis (suicidal ideations/suicide attempt).  With written consent from the patient, the family member/significant other has been provided the following suicide prevention education, prior to the and/or following the discharge of the patient.  The suicide prevention education provided includes the following:  Suicide risk factors  Suicide prevention and interventions  National Suicide Hotline telephone number  [facility] assessment telephone number  [facility] Emergency Assistance 911  [facility] and/or Residential Mobile Crisis Unit telephone number  Request made of family/significant other to:  Remove weapons (e.g., guns, rifles, knives), all items previously/currently identified as safety concern.    Remove drugs/medications (over-the-counter, prescriptions, illicit drugs), all items previously/currently identified as a safety concern.  The family member/significant other verbalizes understanding of the suicide prevention education information provided.  The family member/significant other agrees to remove the items of safety concern listed above.  Jake Shark PMSW,[name] 09/20/2015, 2:15 PM

## 2015-09-20 NOTE — Progress Notes (Signed)
[facility] MD Progress Note  09/20/2015 12:26 AM Peter Wilson  MRN:  [id number]  Subjective: Peter Wilson is asleep in bed, hard to wake up. He keps his head under overs. He denies any symptoms of depression, anxiety or psychosis.No side efects from medications. No somatic complaints.  Principal Problem: Schizophrenia, paranoid type Diagnosis:   Patient Active Problem List   Diagnosis Date Noted  . Tardive dyskinesia [G24.01] 09/15/2015  . Antisocial traits [F60.2] 09/15/2015  . Tobacco use disorder [Z72.0] 09/14/2015  . Schizophrenia, paranoid type [F20.0] 09/14/2015  . Cannabis use disorder, moderate, dependence [F12.20] 09/14/2015   Total Time spent with patient: 20 minutes   Past Medical History:  Past Medical History  Diagnosis Date  . Schizophrenia, paranoid    History reviewed. No pertinent past surgical history. Family History: History reviewed. No pertinent family history. Social History:  History  Alcohol Use No     History  Drug Use  . Yes  . Special: Marijuana    Social History   Social History  . Marital Status: Single    Spouse Name: N/A  . Number of Children: N/A  . Years of Education: N/A   Social History Main Topics  . Smoking status: Current Every Day Smoker -- 2.00 packs/day    Types: Cigarettes  . Smokeless tobacco: None  . Alcohol Use: No  . Drug Use: Yes    Special: Marijuana  . Sexual Activity: Not Asked   Other Topics Concern  . None   Social History Narrative   Additional History:    Sleep: Good  Appetite:  Good   Assessment:   Musculoskeletal: Strength & Muscle Tone: within normal limits Gait & Station: normal Patient leans: N/A   Psychiatric Specialty Exam: Physical Exam  Nursing note and vitals reviewed.   Review of Systems  All other systems reviewed and are negative.   Blood pressure 106/72, pulse 85, temperature 98.3 F (36.8 C), temperature source Oral, resp. rate 20, height  (1.956 m), weight 79.379 kg (175  lb), SpO2 100 %.Body mass index is 20.75 kg/(m^2).  General Appearance: Casual  Eye Contact::  Minimal  Speech:  Clear and Coherent  Volume:  Normal  Mood:  Euthymic  Affect:  Flat  Thought Process:  Goal Directed  Orientation:  Full (Time, Place, and Person)  Thought Content:  WDL  Suicidal Thoughts:  No  Homicidal Thoughts:  No  Memory:  Immediate;   Fair Recent;   Fair Remote;   Fair  Judgement:  Impaired  Insight:  Shallow  Psychomotor Activity:  Decreased  Concentration:  Fair  Recall:  [employer] of Knowledge:Fair  Language: Fair  Akathisia:  No  Handed:  Right  AIMS (if indicated):     Assets:  Communication Skills Desire for Improvement Financial Resources/Insurance  ADL's:  Intact  Cognition: WNL  Sleep:  Number of Hours: 7.25     Current Medications: Current Facility-Administered Medications  Medication Dose Route Frequency Peter Wilson Last Rate Last Dose  . acetaminophen (TYLENOL) tablet 650 mg  650 mg Oral Q6H PRN Peter Footman, MD   650 mg at 09/18/15 1614  . alum & mag hydroxide-simeth (MAALOX/MYLANTA) 200-200-20 MG/5ML suspension 30 mL  30 mL Oral Q4H PRN Peter Footman, MD      . benztropine (COGENTIN) tablet 1 mg  1 mg Oral BID Peter Footman, MD   1 mg at 09/19/15 2104  . divalproex (DEPAKOTE) DR tablet 1,000 mg  1,000 mg Oral QHS Peter Footman, MD  1,000 mg at 09/19/15 2104  . haloperidol (HALDOL) tablet 10 mg  10 mg Oral BID Peter Footman, MD   10 mg at 09/19/15 2104  . Influenza vac split quadrivalent PF (FLUARIX) injection 0.5 mL  0.5 mL Intramuscular Tomorrow-1000 [name] [name], MD      . LORazepam (ATIVAN) tablet 0.5 mg  0.5 mg Oral BID Peter Footman, MD   0.5 mg at 09/19/15 2104  . LORazepam (ATIVAN) tablet 2 mg  2 mg Oral Q4H PRN Peter Footman, MD   2 mg at 09/18/15 1614  . magnesium hydroxide (MILK OF MAGNESIA) suspension 30 mL  30 mL Oral Daily PRN Peter Footman, MD      . nicotine (NICODERM CQ - dosed in mg/24 hours) patch 21 mg  21 mg Transdermal Daily Peter Footman, MD   21 mg at 09/19/15 1225  . pneumococcal 23 valent vaccine (PNU-IMMUNE) injection 0.5 mL  0.5 mL Intramuscular Tomorrow-1000 [name] [name], MD      . traZODone (DESYREL) tablet 100 mg  100 mg Oral QHS Peter Footman, MD   100 mg at 09/19/15 2104    Lab Results: No results found for this or any previous visit (from the past 48 hour(s)).  Physical Findings: AIMS: Facial and Oral Movements Muscles of Facial Expression: None, normal Lips and Perioral Area: None, normal Jaw: None, normal Tongue: Minimal,Extremity Movements Upper (arms, wrists, hands, fingers): None, normal Lower (legs, knees, ankles, toes): None, normal, Trunk Movements Neck, shoulders, hips: None, normal, Overall Severity Severity of abnormal movements (highest score from questions above): None, normal Incapacitation due to abnormal movements: None, normal Patient's awareness of abnormal movements (rate only patient's report): No Awareness, Dental Status Current problems with teeth and/or dentures?: Yes Does patient usually wear dentures?: No  CIWA:    COWS:     Treatment Plan Summary: Daily contact with patient to assess and evaluate symptoms and progress in treatment and Medication management   Medical Decision Making:  Established Problem, Stable/Improving (1), Review of Psycho-Social Stressors (1), Review or order clinical lab tests (1), Review of Medication Regimen & Side Effects (2) and Review of New Medication or Change in Dosage (17)   39 year old African-American male who carries a diagnosis of schizophrenia presents to our emergency department earlier today voicing homicidal ideation, paranoia and auditory hallucinations. The patient was agitated towards the group home staff members and made several threats against them. It is unclear whether or not the  patient has been compliant with medications as collateral information has been obtained yet.  Schizophrenia: Continue Haldol which was increased from 5/10 to 10 mg po bid, continue Depakote DR 1000 g by mouth daily at bedtime. Poor coping has aggravated patient's mood and behavior. He has a very low frustration tolerance it is hard to differentiate between behavioral issues and paranoia.   Agitation: Continue Ativan 0.5 mg po bid and 2 mg every 4 hours when necessary  Insomnia: Continue trazodone 100 mg by mouth daily at bedtime when necessary  EPS: continue benztropine 1 mg by mouth twice a day.  Tobacco use disorder: Will continue nicotine patch 21 mg  Precautions every 15 minute checks  Diet regular  Discharge planning: Patient was visited by a potential group home that has an opening. Per patient reports the group home told him there will taking on Monday. Social worker is to follow up with them to confirm this information.  Labs: TSH wnl, lipid panel shows low HDL and mildly increased LDL. HBA1c wnl  9/24 and 9/25 No medication changes were offered. Supportive therapy provided.       Jolanta [name] 09/20/2015, 12:26 AM

## 2015-09-20 NOTE — Progress Notes (Signed)
D: Patient has had a calm, pleasant mood today. He comes to nursing station frequently and asks questions but cooperates with redirection. Attending groups and taking meds. Denies SI/HI/AVH. Anticipating discharge this afternoon. A:  Remains on q 15 minute checks, given meds, encouraged participation in milieu. R: No acute distress. D/C to group home today.

## 2015-09-20 NOTE — Progress Notes (Signed)
  [facility] Adult Case Management Discharge Plan :  Will you be returning to the same living situation after discharge:  No. At discharge, do you have transportation home?: Yes,   Group Home Staff Do you have the ability to pay for your medications: Yes,     Release of information consent forms completed and in the chart;  Patient's signature needed at discharge.  Patient to Follow up at: Follow-up Information    Call [facility].   Why:  Hospital Follow up, Outpatient Medication Management, Therapy   Contact information:   [address] [place] [address] phone: [phone] fax: [phone]      Patient denies SI/HI: Yes,       Safety Planning and Suicide Prevention discussed: Yes,     Have you used any form of tobacco in the last 30 days? (Cigarettes, Smokeless Tobacco, Cigars, and/or Pipes): Yes  Has patient been referred to the Quitline?: Patient refused referral  Glennon Mac, MSW, [name] 09/20/2015, 2:16 PM

## 2015-09-20 NOTE — Progress Notes (Signed)
Patient discharged to group home accompanied by group home staff.  Instructions given and patient verbalizes understanding.  Patient denies suicidal ideation, homicidal ideation, auditory or visual hallucinations.  Belongings returned.

## 2015-09-20 NOTE — Plan of Care (Signed)
Problem: Ineffective individual coping Goal: STG: Patient will remain free from self harm Outcome: Progressing Pt safe on the unit     

## 2015-09-21 NOTE — Progress Notes (Signed)
Recreation Therapy Notes  INPATIENT RECREATION TR PLAN  Patient Details Name: Peter Wilson MRN: [id number] DOB: 1976/08/12 Today's Date: 09/21/2015  Rec Therapy Plan Is patient appropriate for Therapeutic Recreation?: Yes Treatment times per week: At least once a week TR Treatment/Interventions: 1:1 session, Group participation (Comment) (Appropriate participation in daily recreation therapy tx)  Discharge Criteria Pt will be discharged from therapy if:: Discharged Treatment plan/goals/alternatives discussed and agreed upon by:: Patient/family  Discharge Summary Short term goals set: See Care Plan Short term goals met: Complete Progress toward goals comments: One-to-one attended Which groups?: Other (Comment) (Self-expression) One-to-one attended: Anger management, stress management, self-esteem Reason goals not met: N/A Therapeutic equipment acquired: None Reason patient discharged from therapy: Discharge from hospital Pt/family agrees with progress & goals achieved: Yes Date patient discharged from therapy: 09/20/15   Leonette Monarch, LRT/CTRS 09/21/2015, 6:27 PM

## 2016-01-24 ENCOUNTER — Emergency Department
Admission: EM | Admit: 2016-01-24 | Discharge: 2016-01-24 | Disposition: A | Discharge status: Other Acute Inpatient Hospital | Payer: Medicare Other | Attending: Emergency Medicine | Admitting: Emergency Medicine

## 2016-01-24 ENCOUNTER — Inpatient Hospital Stay
Admission: EM | Admit: 2016-01-24 | Discharge: 2016-02-01 | DRG: 885 | Disposition: A | Discharge status: Home / Self Care | Payer: Medicare Other | Source: Intra-hospital | Attending: Psychiatry | Admitting: Psychiatry

## 2016-01-24 ENCOUNTER — Encounter: Payer: Self-pay | Admitting: Emergency Medicine

## 2016-01-24 DIAGNOSIS — R251 Tremor, unspecified: Secondary | ICD-10-CM | POA: Diagnosis present

## 2016-01-24 DIAGNOSIS — F17210 Nicotine dependence, cigarettes, uncomplicated: Secondary | ICD-10-CM | POA: Insufficient documentation

## 2016-01-24 DIAGNOSIS — F209 Schizophrenia, unspecified: Secondary | ICD-10-CM | POA: Diagnosis present

## 2016-01-24 DIAGNOSIS — G249 Dystonia, unspecified: Secondary | ICD-10-CM | POA: Diagnosis present

## 2016-01-24 DIAGNOSIS — F1220 Cannabis dependence, uncomplicated: Secondary | ICD-10-CM | POA: Diagnosis present

## 2016-01-24 DIAGNOSIS — F200 Paranoid schizophrenia: Secondary | ICD-10-CM | POA: Diagnosis present

## 2016-01-24 DIAGNOSIS — Z818 Family history of other mental and behavioral disorders: Secondary | ICD-10-CM | POA: Diagnosis not present

## 2016-01-24 DIAGNOSIS — R4689 Other symptoms and signs involving appearance and behavior: Secondary | ICD-10-CM

## 2016-01-24 DIAGNOSIS — F919 Conduct disorder, unspecified: Secondary | ICD-10-CM | POA: Diagnosis present

## 2016-01-24 DIAGNOSIS — Z9119 Patient's noncompliance with other medical treatment and regimen: Secondary | ICD-10-CM | POA: Diagnosis not present

## 2016-01-24 DIAGNOSIS — R45850 Homicidal ideations: Secondary | ICD-10-CM | POA: Diagnosis present

## 2016-01-24 DIAGNOSIS — T148XXA Other injury of unspecified body region, initial encounter: Secondary | ICD-10-CM

## 2016-01-24 DIAGNOSIS — F602 Antisocial personality disorder: Secondary | ICD-10-CM | POA: Diagnosis present

## 2016-01-24 DIAGNOSIS — Z79899 Other long term (current) drug therapy: Secondary | ICD-10-CM | POA: Insufficient documentation

## 2016-01-24 DIAGNOSIS — F911 Conduct disorder, childhood-onset type: Secondary | ICD-10-CM | POA: Diagnosis not present

## 2016-01-24 DIAGNOSIS — E221 Hyperprolactinemia: Secondary | ICD-10-CM | POA: Diagnosis present

## 2016-01-24 DIAGNOSIS — R451 Restlessness and agitation: Secondary | ICD-10-CM | POA: Diagnosis present

## 2016-01-24 DIAGNOSIS — F17200 Nicotine dependence, unspecified, uncomplicated: Secondary | ICD-10-CM | POA: Diagnosis present

## 2016-01-24 LAB — COMPREHENSIVE METABOLIC PANEL
ALK PHOS: 46 U/L (ref 38–126)
ALT: 13 U/L — ABNORMAL LOW (ref 17–63)
ANION GAP: 8 (ref 5–15)
AST: 31 U/L (ref 15–41)
Albumin: 4.5 g/dL (ref 3.5–5.0)
BILIRUBIN TOTAL: 0.5 mg/dL (ref 0.3–1.2)
BUN: 12 mg/dL (ref 6–20)
CALCIUM: 9.4 mg/dL (ref 8.9–10.3)
CO2: 28 mmol/L (ref 22–32)
Chloride: 104 mmol/L (ref 101–111)
Creatinine, Ser: 1.06 mg/dL (ref 0.61–1.24)
GFR calc Af Amer: >60 mL/min (ref >60)
Glucose, Bld: 92 mg/dL (ref 65–99)
POTASSIUM: 4 mmol/L (ref 3.5–5.1)
Sodium: 140 mmol/L (ref 135–145)
Total Protein: 8 g/dL (ref 6.5–8.1)

## 2016-01-24 LAB — URINE DRUG SCREEN, QUALITATIVE (ARMC ONLY)
AMPHETAMINES, UR SCREEN: NOT DETECTED
BENZODIAZEPINE, UR SCRN: NOT DETECTED
Barbiturates, Ur Screen: NOT DETECTED
COCAINE METABOLITE, UR ~~LOC~~: NOT DETECTED
Cannabinoid 50 Ng, Ur ~~LOC~~: NOT DETECTED
MDMA (Ecstasy)Ur Screen: NOT DETECTED
METHADONE SCREEN, URINE: NOT DETECTED
OPIATE, UR SCREEN: NOT DETECTED
Phencyclidine (PCP) Ur S: NOT DETECTED
Tricyclic, Ur Screen: NOT DETECTED

## 2016-01-24 LAB — CBC
HEMATOCRIT: 38.5 % — AB (ref 40.0–52.0)
HEMOGLOBIN: 12.6 g/dL — AB (ref 13.0–18.0)
MCH: 28.6 pg (ref 26.0–34.0)
MCHC: 32.7 g/dL (ref 32.0–36.0)
MCV: 87.5 fL (ref 80.0–100.0)
Platelets: 228 10*3/uL (ref 150–440)
RBC: 4.39 MIL/uL — ABNORMAL LOW (ref 4.40–5.90)
RDW: 14.3 % (ref 11.5–14.5)
WBC: 3.9 10*3/uL (ref 3.8–10.6)

## 2016-01-24 LAB — ACETAMINOPHEN LEVEL

## 2016-01-24 LAB — ETHANOL: Alcohol, Ethyl (B): <5 mg/dL (ref <5)

## 2016-01-24 LAB — SALICYLATE LEVEL: Salicylate Lvl: <4 mg/dL (ref 2.8–30.0)

## 2016-01-24 LAB — VALPROIC ACID LEVEL: Valproic Acid Lvl: 43 ug/mL — ABNORMAL LOW (ref 50.0–100.0)

## 2016-01-24 MED ORDER — BENZTROPINE MESYLATE 1 MG PO TABS
1.0000 mg | ORAL_TABLET | Freq: Two times a day (BID) | ORAL | Status: DC
  Administered 2016-01-24: 1 mg via ORAL

## 2016-01-24 MED ORDER — LORAZEPAM 2 MG PO TABS: 2.0000 mg | ORAL_TABLET | ORAL | Status: DC | PRN

## 2016-01-24 MED ORDER — CLONAZEPAM 1 MG PO TABS
1.0000 mg | ORAL_TABLET | Freq: Every day | ORAL | Status: DC
  Administered 2016-01-24: 1 mg via ORAL

## 2016-01-24 MED ORDER — DIVALPROEX SODIUM ER 500 MG PO TB24
1000.0000 mg | ORAL_TABLET | Freq: Every day | ORAL | Status: DC
  Administered 2016-01-24: 1000 mg via ORAL

## 2016-01-24 MED ORDER — HALOPERIDOL 5 MG PO TABS
10.0000 mg | ORAL_TABLET | Freq: Two times a day (BID) | ORAL | Status: DC
  Administered 2016-01-24: 10 mg via ORAL

## 2016-01-24 MED ORDER — LORAZEPAM 2 MG PO TABS
2.0000 mg | ORAL_TABLET | ORAL | Status: DC | PRN
  Administered 2016-01-30: 2 mg via ORAL
  Filled 2016-01-24: qty 1

## 2016-01-24 NOTE — Tx Team (Signed)
Initial Interdisciplinary Treatment Plan   PATIENT STRESSORS: Loss of grandmother Medication change or noncompliance Substance abuse   PATIENT STRENGTHS: General fund of knowledge Physical Health   PROBLEM LIST: Problem List/Patient Goals Date to be addressed Date deferred Reason deferred Estimated date of resolution  Aggression 01/24/16     Paranoia 01/24/16                                                DISCHARGE CRITERIA:  Adequate post-discharge living arrangements Improved stabilization in mood, thinking, and/or behavior Motivation to continue treatment in a less acute level of care Verbal commitment to aftercare and medication compliance  PRELIMINARY DISCHARGE PLAN: Attend aftercare/continuing care group Outpatient therapy Placement in alternative living arrangements  PATIENT/FAMIILY INVOLVEMENT: This treatment plan has been presented to and reviewed with the patient, Mehul Rudin, and/or family member.  The patient and family have been given the opportunity to ask questions and make suggestions.  Stann Mainland Parsells 01/24/2016, 11:47 PM

## 2016-01-24 NOTE — ED Notes (Signed)
Pt. Noted sleeping in room. No complaints or concerns voiced. No distress or abnormal behavior noted. Will continue to monitor with security cameras. Q 15 minute rounds continue. 

## 2016-01-24 NOTE — ED Notes (Signed)
Pt unwilling to speak upon arrival to [facility]. Pt sat on the edge of his bed and placed his head in his hands. Pt appeared angry. Pt did ask to use the phone. Maintained on 15 minute checks and observation by security camera for safety.
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
## 2016-01-24 NOTE — ED Notes (Signed)
Pt brought phone back to nurses station.  Pt stated, "If anyone calls for me tell them I am dead to the world. Im not talking to anyone."   Pt went back to room, slammed door and laid on bed.

## 2016-01-24 NOTE — ED Notes (Signed)
Report received from [facility] RN. Pt. Alert and oriented in no acute distress denies SI, HI, AVH and pain.  Pt. States he doesn't want to talk to anyone. Pt. Instructed to come to me with problems or concerns.Will continue to monitor for safety via security cameras and Q 15 minute checks.

## 2016-01-24 NOTE — ED Notes (Signed)
Lunch tray given. 
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
## 2016-01-24 NOTE — ED Notes (Signed)
RN unable to do complete psych assessment. Pt unwilling to speak to this Clinical research associate.
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
## 2016-01-24 NOTE — ED Notes (Signed)
Patient resting quietly in room. No noted distress or abnormal behaviors noted. Will continue 15 minute checks and observation by security camera for safety. 
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
## 2016-01-24 NOTE — ED Notes (Signed)
Patient assigned to appropriate care area. Patient oriented to unit/care area: Informed that, for their safety, care areas are designed for safety and monitored by security cameras at all times; and visiting hours explained to patient. Patient verbalizes understanding, and verbal contract for safety obtained. 

## 2016-01-24 NOTE — ED Notes (Signed)
Patient dressed out and belongings secured at this time by me.

## 2016-01-25 ENCOUNTER — Encounter: Payer: Self-pay | Admitting: Psychiatry

## 2016-01-25 DIAGNOSIS — F209 Schizophrenia, unspecified: Principal | ICD-10-CM

## 2016-01-25 MED ORDER — BENZTROPINE MESYLATE 1 MG PO TABS
1.0000 mg | ORAL_TABLET | Freq: Two times a day (BID) | ORAL | Status: DC
  Administered 2016-01-25 (×2): 1 mg via ORAL
  Filled 2016-01-25 (×2): qty 1

## 2016-01-25 MED ORDER — BENZTROPINE MESYLATE 1 MG PO TABS: 1.0000 mg | ORAL_TABLET | Freq: Two times a day (BID) | ORAL | Status: DC

## 2016-01-25 MED ORDER — ALUM & MAG HYDROXIDE-SIMETH 200-200-20 MG/5ML PO SUSP: 30.0000 mL | ORAL | Status: DC | PRN

## 2016-01-25 MED ORDER — DIVALPROEX SODIUM 500 MG PO DR TAB
1000.0000 mg | DELAYED_RELEASE_TABLET | Freq: Every day | ORAL | Status: DC
Start: 2016-01-25 — End: 2016-01-26
  Administered 2016-01-25: 1000 mg via ORAL
  Filled 2016-01-25: qty 2

## 2016-01-25 MED ORDER — HALOPERIDOL 5 MG PO TABS
10.0000 mg | ORAL_TABLET | Freq: Two times a day (BID) | ORAL | Status: DC
  Administered 2016-01-25 – 2016-01-28 (×7): 10 mg via ORAL
  Filled 2016-01-25 (×7): qty 2

## 2016-01-25 MED ORDER — LORAZEPAM 2 MG PO TABS
2.0000 mg | ORAL_TABLET | Freq: Every day | ORAL | Status: DC
  Administered 2016-01-25 – 2016-01-31 (×7): 2 mg via ORAL
  Filled 2016-01-25 (×7): qty 1

## 2016-01-25 MED ORDER — TUBERCULIN PPD 5 UNIT/0.1ML ID SOLN
5.0000 [IU] | Freq: Once | INTRADERMAL | Status: AC
  Administered 2016-01-25: 5 [IU] via INTRADERMAL
  Filled 2016-01-25: qty 0.1

## 2016-01-25 MED ORDER — CLONAZEPAM 1 MG PO TABS: 1.0000 mg | ORAL_TABLET | Freq: Every day | ORAL | Status: DC

## 2016-01-25 MED ORDER — HALOPERIDOL 5 MG PO TABS: 10.0000 mg | ORAL_TABLET | Freq: Two times a day (BID) | ORAL | Status: DC

## 2016-01-25 MED ORDER — ACETAMINOPHEN 325 MG PO TABS
650.0000 mg | ORAL_TABLET | Freq: Four times a day (QID) | ORAL | Status: DC | PRN
  Administered 2016-01-26 – 2016-02-01 (×7): 650 mg via ORAL
  Filled 2016-01-25 (×8): qty 2

## 2016-01-25 MED ORDER — CLONAZEPAM 1 MG PO TABS
1.0000 mg | ORAL_TABLET | Freq: Every day | ORAL | Status: DC
Start: 2016-01-25 — End: 2016-01-25

## 2016-01-25 MED ORDER — IBUPROFEN 800 MG PO TABS
800.0000 mg | ORAL_TABLET | Freq: Three times a day (TID) | ORAL | Status: AC
  Administered 2016-01-25 (×3): 800 mg via ORAL
  Filled 2016-01-25 (×4): qty 1

## 2016-01-25 MED ORDER — DIVALPROEX SODIUM ER 500 MG PO TB24: 1000.0000 mg | ORAL_TABLET | Freq: Every day | ORAL | Status: DC

## 2016-01-25 MED ORDER — MAGNESIUM HYDROXIDE 400 MG/5ML PO SUSP
30.0000 mL | Freq: Every day | ORAL | Status: DC | PRN
Start: 2016-01-25 — End: 2016-02-01

## 2016-01-25 NOTE — Plan of Care (Signed)
Problem: Ineffective individual coping Goal: STG: Patient will remain free from self harm Outcome: Progressing Patient remains free from self harm this shift     

## 2016-01-25 NOTE — Progress Notes (Signed)
D:  Patient is alert and oriented on the unit this shift.  Patient denies suicidal ideation, homicidal ideation, auditory or visual hallucinations at the present time.   A:  Scheduled medications are administered to patient as per MD orders.  Emotional support and encouragement are provided.  Patient is maintained on q.15 minute safety checks.  Patient is informed to notify staff with questions or concerns. R:  No adverse medication reactions are noted.  Patient is cooperative with medication administration today.  Patient is receptive, calm and cooperative on the unit at this time.  Patient contracts for safety at this time.  Patient remains safe at this time.

## 2016-01-25 NOTE — Plan of Care (Signed)
Problem: Ineffective individual coping Goal: LTG: Patient will report a decrease in negative feelings Outcome: Progressing Patient reports feeling better today than he did yesterday     

## 2016-01-25 NOTE — Progress Notes (Signed)
Patient refused to cooperate with ordered lab draw today at 1110
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
## 2016-01-25 NOTE — Progress Notes (Signed)
Patient ID: Peter Wilson, male   DOB: July 01, 1976, 40 y.o.   MRN: [id number] CSW attempted to complete a PSA on the pt but pt refused and requested to complete the PSA the following day.

## 2016-01-25 NOTE — [facility] Suicide Risk Assessment (Signed)
[facility] Admission Suicide Risk Assessment   Nursing information obtained from:  Patient, Review of record Demographic factors:  Male, Low socioeconomic status Current Mental Status:  NA Loss Factors:  Loss of significant relationship (pt states grandmother just died) Historical Factors:  Impulsivity Risk Reduction Factors:  NA  Total Time spent with patient: 1 hour Principal Problem: Schizophrenia (HCC) Diagnosis:   Patient Active Problem List   Diagnosis Date Noted  . Schizophrenia (HCC) [F20.9] 01/25/2016  . Tardive dyskinesia [G24.01] 09/15/2015  . Antisocial traits [F60.2] 09/15/2015  . Tobacco use disorder [F17.200] 09/14/2015  . Cannabis use disorder, moderate, dependence (HCC) [F12.20] 09/14/2015   Subjective Data:   Continued Clinical Symptoms:  Alcohol Use Disorder Identification Test Final Score (AUDIT): 0 The "Alcohol Use Disorders Identification Test", Guidelines for Use in Primary Care, Second Edition.  World Science writer [facility]). Score between 0-7:  no or low risk or alcohol related problems. Score between 8-15:  moderate risk of alcohol related problems. Score between 16-19:  high risk of alcohol related problems. Score 20 or above:  warrants further diagnostic evaluation for alcohol dependence and treatment.   CLINICAL FACTORS:   Alcohol/Substance Abuse/Dependencies Schizophrenia:   Less than 40 years old Paranoid or undifferentiated type Currently Psychotic Previous Psychiatric Diagnoses and Treatments   Musculoskeletal:  Psychiatric Specialty Exam: ROS  Blood pressure 94/76, pulse 72, temperature 98 F (36.7 C), temperature source Oral, resp. rate 14, height 6' (1.829 m), weight 77.111 kg (170 lb), SpO2 98 %.Body mass index is 23.05 kg/(m^2).   COGNITIVE FEATURES THAT CONTRIBUTE TO RISK:  None    SUICIDE RISK:   Mild:  Suicidal ideation of limited frequency, intensity, duration, and specificity.  There are no identifiable plans, no associated  intent, mild dysphoria and related symptoms, good self-control (both objective and subjective assessment), few other risk factors, and identifiable protective factors, including available and accessible social support.  PLAN OF CARE: admit to [facility]  I certify that inpatient services furnished can reasonably be expected to improve the patient's condition.   Jimmy Footman, MD 01/25/2016, 3:51 PM
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
## 2016-01-25 NOTE — Progress Notes (Signed)
Recreation Therapy Notes  Date: 01.31.17 Time: 3:00 pm Location: Craft Room  Group Topic: Goal Setting  Goal Area(s) Addresses:  Patient will write at least one goal. Patient will write at least one obstacle.  Behavioral Response: Did not attend  Intervention: Recovery Goal Chart  Activity: Patients were instructed to make a Recovery Goal Chart including goals, obstacles, the date they started working on their goals, and the date they achieved their goals.  Education: LRT educated patients on ways they can celebrate reaching their goals.  Education Outcome: Patient did not attend group.  Clinical Observations/Feedback: Patient did not attend group.  Jacquelynn Cree, LRT/CTRS 01/25/2016 4:25 PM

## 2016-01-26 MED ORDER — HALOPERIDOL DECANOATE 100 MG/ML IM SOLN
50.0000 mg | Freq: Once | INTRAMUSCULAR | Status: AC
  Administered 2016-01-27: 50 mg via INTRAMUSCULAR
  Filled 2016-01-26: qty 0.5

## 2016-01-26 MED ORDER — DIVALPROEX SODIUM ER 500 MG PO TB24
2000.0000 mg | ORAL_TABLET | Freq: Every day | ORAL | Status: DC
  Administered 2016-01-26 – 2016-01-31 (×6): 2000 mg via ORAL
  Filled 2016-01-26 (×7): qty 4

## 2016-01-26 MED ORDER — AMANTADINE HCL 100 MG PO CAPS
100.0000 mg | ORAL_CAPSULE | Freq: Two times a day (BID) | ORAL | Status: DC
  Administered 2016-01-26 – 2016-02-01 (×13): 100 mg via ORAL
  Filled 2016-01-26 (×13): qty 1

## 2016-01-26 NOTE — Plan of Care (Signed)
Problem: Aggression Towards others,Towards Self, and or Destruction Goal: STG-Patient will comply with prescribed medication regimen (Patient will comply with prescribed medication regimen)  Outcome: Progressing Pt complying with medication regimen with little redirection needed

## 2016-01-26 NOTE — [facility] Suicide Risk Assessment (Signed)
[facility] INPATIENT:  Family/Significant Other Suicide Prevention Education  Suicide Prevention Education:  Patient Refusal for Family/Significant Other Suicide Prevention Education: The patient Peter Wilson has refused to provide written consent for family/significant other to be provided Family/Significant Other Suicide Prevention Education during admission and/or prior to discharge.  Physician notified.  Pt refused to compete SPE with the CSW.  Dorothe Pea Prisila Dlouhy 01/26/2016, 1:56 PM

## 2016-01-26 NOTE — [place] FL2 (Addendum)
[place] MEDICAID FL2 LEVEL OF CARE SCREENING TOOL     IDENTIFICATION  Patient Name: Peter Wilson Birthdate: Oct 22, 1976 Sex: male Admission Date (Current Location): 01/24/2016  [facility] and [place] Number:  [name] [id number] [facility] Facility and Address:  [facility], [address] [place], [place] [address]      Provider Number: [id number]  Attending Physician Name and Address:  Barnabas Harries*  Relative Name and Phone Number:  Daviyon, Widmayer (Mother)  [phone]    Current Level of Care: Hospital Recommended Level of Care: Family care Home  Prior Approval Number:    Date Approved/Denied: 09/20/15 PASRR Number: [id number]  Discharge Plan: Other (Comment) Group home    Current Diagnoses: Patient Active Problem List   Diagnosis Date Noted  . Schizophrenia (HCC) 01/25/2016  . Tardive dyskinesia 09/15/2015  . Antisocial traits 09/15/2015  . Tobacco use disorder 09/14/2015  . Cannabis use disorder, moderate, dependence (HCC) 09/14/2015    Orientation RESPIRATION BLADDER Height & Weight     Self, Time, Place  Normal Continent Weight: 170 lb (77.111 kg) Height:  6' (182.9 cm)  BEHAVIORAL SYMPTOMS/MOOD NEUROLOGICAL BOWEL NUTRITION STATUS  Verbally abusive  (None) Continent  (Normal )  AMBULATORY STATUS COMMUNICATION OF NEEDS Skin   Independent Verbally Normal                       Personal Care Assistance Level of Assistance  Bathing, Feeding, Dressing, Total care Bathing Assistance: Independent Feeding assistance: Independent Dressing Assistance: Independent Total Care Assistance: Independent   Functional Limitations Info  Sight, Hearing, Speech Sight Info: Adequate Hearing Info: Adequate Speech Info: Adequate    SPECIAL CARE FACTORS FREQUENCY   None                     Contractures Contractures Info: Not present    Additional Factors Info  Psychotropic  See medications              Current  Medications (01/31/2016):  This is the current hospital active medication list Current Facility-Administered Medications  Medication Dose Route Frequency Provider Last Rate Last Dose  . acetaminophen (TYLENOL) tablet 650 mg  650 mg Oral Q6H PRN Audery Amel, MD   650 mg at 01/30/16 2141  . albuterol (PROVENTIL) (2.5 MG/3ML) 0.083% nebulizer solution 3 mL  3 mL Inhalation Q4H PRN Darliss Ridgel, MD      . alum & mag hydroxide-simeth (MAALOX/MYLANTA) 200-200-20 MG/5ML suspension 30 mL  30 mL Oral Q4H PRN Audery Amel, MD      . amantadine (SYMMETREL) capsule 100 mg  100 mg Oral BID Jimmy Footman, MD   100 mg at 01/31/16 0829  . divalproex (DEPAKOTE ER) 24 hr tablet 2,000 mg  2,000 mg Oral QHS Jimmy Footman, MD   2,000 mg at 01/30/16 2139  . haloperidol (HALDOL) tablet 10 mg  10 mg Oral BID Jimmy Footman, MD   10 mg at 01/31/16 5366  . LORazepam (ATIVAN) tablet 2 mg  2 mg Oral Q4H PRN Audery Amel, MD   2 mg at 01/30/16 2144  . LORazepam (ATIVAN) tablet 2 mg  2 mg Oral QHS Jimmy Footman, MD   2 mg at 01/30/16 2140  . magnesium hydroxide (MILK OF MAGNESIA) suspension 30 mL  30 mL Oral Daily PRN Audery Amel, MD         Discharge Medications: Please see discharge summary for a list of discharge medications.  Relevant Imaging Results:  Relevant Lab Results:  Additional Information:   Rondall Allegra MSW, [address]
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
## 2016-01-26 NOTE — Progress Notes (Signed)
Recreation Therapy Notes  INPATIENT RECREATION THERAPY ASSESSMENT  Patient Details Name: Peter Wilson MRN: [id number] DOB: 28-Jul-1976 Today's Date: 02/01/2016  Patient Stressors: Death (Grandmother passed away 3 days ago)  Coping Skills:   Isolate, Substance Abuse, Avoidance, Exercise, Talking, Music, Sports  Personal Challenges: Communication, Concentration, Decision-Making, Expressing Yourself, Problem-Solving, Relationships, Stress Management, Trusting Others  Leisure Interests (2+):  Music - Listen, Individual - Other (Comment) (smoke weed)  Awareness of Community Resources:  Yes  [employer]:  Library, [place], Tree surgeon  Current Use: Yes  If no, Barriers?:    Patient Strengths:  "I love being me"  Patient Identified Areas of Improvement:  No  Current Recreation Participation:  Smoking weed  Patient Goal for Hospitalization:  "Don't say nothing out of control t onobody and go home"  [employer] of Residence:  [place] of Residence:  [place]   Current [place] (including self-harm):  No  Current HI:  No  Consent to Intern Participation: N/A   Jacquelynn Cree, LRT/CTRS 02-01-2016, 5:30 PM

## 2016-01-26 NOTE — BHH Group Notes (Signed)
BHH Group Notes:  (Nursing/MHT/Case Management/Adjunct)  Date:  01/26/2016  Time:  2:02 PM  Type of Therapy:  Psychoeducational Skills  Participation Level:  Did Not Attend  Jodey Burbano Lea Campbell 01/26/2016, 2:02 PM 
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
## 2016-01-26 NOTE — [facility] Group Notes (Signed)
[facility] [name] Group Therapy  01/26/2016 3:58 PM  Type of Therapy:  Group Therapy  Participation Level:  Active  Participation Quality:  Appropriate and Attentive  Affect:  Appropriate  Cognitive:  Alert, Appropriate and Oriented  Insight:  Engaged  Engagement in Therapy:  Engaged  Modes of Intervention:  Socialization and Support  Summary of Progress/Problems:Patient attended and participated in group discussion appropriately introducing himself but shared he did not know his 'superpower' during the introductory exercise. Patient participated with other group members making some laugh and was appropriate during group discussion. Patient was attentive throughout group discussion and was able to relate to others not understanding his thinking and realizing that his thinking was not clear. Patient was able to identify coping skills of music being helpful to him in dealing with stress.   Lulu Riding, MSW, LCSWA 01/26/2016, 3:58 PM

## 2016-01-26 NOTE — Progress Notes (Signed)
Recreation Therapy Notes  Date: 02.01.17 Time: 3:00 pm  Location: Craft Room  Group Topic: Self-esteem  Goal Area(s) Addresses:  Patients will write at least one positive trait about themselves. Patients will verbalize benefit of having a healthy self-esteem.  Behavioral Response: Attentive  Intervention: I Am  Activity: Patients were given a worksheet with the letter I on it and instructed to write as many positive traits inside the letter.  Education: LRT educated patients on ways they can increase their self-esteem.  Education Outcome: In group clarification offered  Clinical Observations/Feedback: Patient completed activity by writing positive traits. Patient did not contribute to group discussion.  Jacquelynn Cree, LRT/CTRS 01/26/2016 5:02 PM

## 2016-01-26 NOTE — [facility] Group Notes (Signed)
[facility] [name] Aftercare Discharge Planning Group Note   01/26/2016 3:57 PM  Participation Quality:  Patient was in hallway and stated he did not need group and did not attend group discussion.    Lulu Riding, MSW, LCSWA

## 2016-01-27 LAB — LIPID PANEL
CHOL/HDL RATIO: 3.4 ratio
CHOLESTEROL: 150 mg/dL (ref 0–200)
HDL: 44 mg/dL (ref >40)
LDL Cholesterol: 89 mg/dL (ref 0–99)
TRIGLYCERIDES: 83 mg/dL (ref <150)
VLDL: 17 mg/dL (ref 0–40)

## 2016-01-27 LAB — HEMOGLOBIN A1C: HEMOGLOBIN A1C: 5 % (ref 4.0–6.0)

## 2016-01-27 LAB — TSH: TSH: 2.677 u[IU]/mL (ref 0.350–4.500)

## 2016-01-27 NOTE — Progress Notes (Signed)
Patient with depressed affect, sleepy in bed this am. Cooperative with meds and meals. Encouraged to attend therapy groups to learn and initiate coping skills to manage stressors and diagnosis. Safety maintained at this time.

## 2016-01-27 NOTE — Plan of Care (Signed)
Problem: [facility] Participation in Recreation Therapeutic Interventions Goal: STG-Other Recreation Therapy Goal (Specify) STG: Decision Making - Within 4 treatment sessions, patient will verbalize understanding of the decision making charts in each of 2 treatment sessions to increase decision making skills post d/c.  Outcome: Progressing Treatment Session 1; Completed 1 out of 2: At approximately 12:45 pm, LRT met with patient in craft room. LRT educated and provided patient with decision making charts. Patient verbalized understanding. LRT encouraged patient to use the decision making charts to help him make better decisions. Intervention Used: Decision Making charts   M , LRT/CTRS 02.02.17 4:23 pm  Problem: [facility] Participation in Recreation Therapeutic Interventions Goal: STG-Other Recreation Therapy Goal (Specify) STG: Stress Management - Within 4 treatment sessions, patient will verbalize understanding of the stress management techniques in each of 2 treatment sessions to increase stress management skills post d/c.  Outcome: Progressing Treatment Session 1; Completed 1 out of 2: At approximately 12:45 pm, LRT met with patient in craft room. LRT educated and provided patient with handouts on stress management techniques. Patient verbalized understanding. LRT encouraged patient to read over and practice the stress management techniques. Intervention Used: Stress Management handouts   M , LRT/CTRS 02.02.17 4:25 pm     

## 2016-01-27 NOTE — [facility] Group Notes (Signed)
[facility] [name] Group Therapy  01/27/2016 3:50 PM  Type of Therapy:  Group Therapy  Participation Level:  Did Not Attend   Modes of Intervention:  Discussion, Education, Socialization and Support  Summary of Progress/Problems: Balance in life: Patients will discuss the concept of balance and how it looks and feels to be unbalanced. Pt will identify areas in their life that is unbalanced and ways to become more balanced.    Kairon Shock L Stevana Dufner MSW, LCSWA  01/27/2016, 3:50 PM   

## 2016-01-27 NOTE — [facility] Group Notes (Signed)
[facility] Group Notes:  (Nursing/MHT/Case Management/Adjunct)  Date:  01/27/2016  Time:  12:59 AM  Type of Therapy:  Group Therapy  Participation Level:  Active  Participation Quality:  Appropriate  Affect:  Appropriate  Cognitive:  Appropriate  Insight:  Appropriate  Engagement in Group:  Engaged  Modes of Intervention:  n/a  Summary of Progress/Problems:  Peter Wilson 01/27/2016, 12:59 AM
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
## 2016-01-27 NOTE — Plan of Care (Signed)
Problem: Consults Goal: [facility] General Treatment Patient Education Outcome: Progressing Patient cooperative with meds this am. Withdrawn and sleepy in bed this am.

## 2016-01-27 NOTE — Progress Notes (Signed)
D: Patient appears bright in the milieu. Denies SI/HI/AVH. States he's ready to leave. C/o pain. Visible in the milieu interacting with patients.  A: Medication given with education. Pain medication given for pain. Encouragement provided.  R: Patient was compliant with medication. He has been calm and cooperative. Safety maintained with 15 min checks.

## 2016-01-27 NOTE — Plan of Care (Signed)
Problem: Alteration in thought process Goal: STG-Patient is able to follow short directions Outcome: Progressing When asked to come to the med room the patient was able to do so.
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
## 2016-01-27 NOTE — Progress Notes (Signed)
Recreation Therapy Notes  Date: 02.02.17 Time: 3:00 pm Location: Craft Room  Group Topic: Leisure Education  Goal Area(s) Addresses:  Patient will identify things they are grateful for. Patient will identify how being grateful can influence decision making.  Behavioral Response: Attentive, Interactive  Intervention: Grateful Wheel  Activity: Patients were given an "I Am Grateful For" worksheet and instructed to list 2-3 things they are grateful for under each category.  Education: LRT educated patients on ways they can put leisure back into their schedule.  Education Outcome: Acknowledges education/In group clarification offered  Clinical Observations/Feedback: Patient completed activity by writing down things she is grateful for. Patient contributed to group discussion by stating what he is grateful for.  Jacquelynn Cree, LRT/CTRS 01/27/2016 3:53 PM

## 2016-01-28 ENCOUNTER — Inpatient Hospital Stay: Payer: Medicare Other

## 2016-01-28 LAB — PROLACTIN: Prolactin: 40.2 ng/mL — ABNORMAL HIGH (ref 4.0–15.2)

## 2016-01-28 MED ORDER — LORAZEPAM 2 MG PO TABS
2.0000 mg | ORAL_TABLET | Freq: Once | ORAL | Status: AC
  Administered 2016-01-28: 2 mg via ORAL

## 2016-01-28 MED ORDER — HALOPERIDOL 5 MG PO TABS
10.0000 mg | ORAL_TABLET | Freq: Two times a day (BID) | ORAL | Status: DC
  Administered 2016-01-29 – 2016-02-01 (×7): 10 mg via ORAL
  Filled 2016-01-28 (×7): qty 2

## 2016-01-28 MED ORDER — DIPHENHYDRAMINE HCL 25 MG PO CAPS
50.0000 mg | ORAL_CAPSULE | Freq: Once | ORAL | Status: AC
  Administered 2016-01-28: 50 mg via ORAL
  Filled 2016-01-28: qty 2

## 2016-01-28 MED ORDER — HALOPERIDOL 5 MG PO TABS
10.0000 mg | ORAL_TABLET | Freq: Once | ORAL | Status: AC
  Administered 2016-01-28: 10 mg via ORAL
  Filled 2016-01-28: qty 2

## 2016-01-28 NOTE — Progress Notes (Signed)
Recreation Therapy Notes  Date: 02.03.17 Time: 3:00 pm Location: Craft Room  Group Topic: Communication, Problem Solving, Teamwork  Goal Area(s) Addresses:  Patient will work in teams towards shared goal. Patient will verbalize skills needed to make activities successful. Patient will verbalize benefit of using skills identify to reach post d/c goals.  Behavioral Response: Attentive, Interactive  Intervention: Landing Pad  Activity: Patients were given 15 straws and about 2.5-3 feet of tape and instructed to build a contraption to catch a golf ball dropped from about 4 feet.  Education: LRT educated patients on why these skills are important.  Education Outcome: In group clarification offered  Clinical Observations/Feedback: Patient worked with peers to build contraption. Patient used effective teamwork, communication, and problem solving skills. Patient contributed to group discussion by stating what would change for him if he started using the skills he used in group.  Jacquelynn Cree, LRT/CTRS 01/28/2016 4:50 PM

## 2016-01-28 NOTE — Progress Notes (Signed)
Pt did not want to fill out self inventory sheet, during interaction pt states he is frustrated with the Doctor because "Peter Wilson is her middle name", pt is focused on discharge and does not want to be here, pleasant towards staff and other patients on the unit, emotional support provided, no complaints at this time, denies SI/HI/AVH.
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
## 2016-01-28 NOTE — Plan of Care (Signed)
Problem: [facility] Participation in Recreation Therapeutic Interventions Goal: STG-Other Recreation Therapy Goal (Specify) STG: Decision Making - Within 4 treatment sessions, patient will verbalize understanding of the decision making charts in each of 2 treatment sessions to increase decision making skills post d/c.  Outcome: Completed/Met Date Met:  01/28/16 Treatment Session 2; Completed 2 out of 2: At approximately 11:35 am, LRT met with patient in consultation room. Patient verbalized understanding of the decision making charts. LRT encouraged patient to use the charts. Intervention Used: Decision Making charts  Leonette Monarch, LRT/CTRS 02.03.17 12:35 pm  Problem: [facility] Participation in Recreation Therapeutic Interventions Goal: STG-Other Recreation Therapy Goal (Specify) STG: Stress Management - Within 4 treatment sessions, patient will verbalize understanding of the stress management techniques in each of 2 treatment sessions to increase stress management skills post d/c.  Outcome: Completed/Met Date Met:  01/28/16 Treatment Session 2; Completed 2 out of 2: At approximately 11:35 am, LRT met with patient in consultation room. Patient reported he read over and practiced some of the stress management techniques. Patient verbalized understanding and reported they were helpful. LRT encouraged patient to continue practicing the stress management techniques. Intervention Used: Stress Management handouts  Leonette Monarch, LRT/CTRS 02.03.17 12:37 pm
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
## 2016-01-28 NOTE — [facility] Group Notes (Signed)
[facility] Group Notes:  (Nursing/MHT/Case Management/Adjunct)  Date:  01/28/2016  Time:  2:54 PM  Type of Therapy:  Group Therapy  Participation Level:  Did Not Attend  Peter Wilson 01/28/2016, 2:54 PM 

## 2016-01-28 NOTE — [facility] Group Notes (Signed)
[facility] [name] Group Therapy  01/28/2016 2:47 PM  Type of Therapy:  Group Therapy  Participation Level:  Did Not Attend  Modes of Intervention:  Discussion, Education, Socialization and Support  Summary of Progress/Problems: Feelings around Relapse. Group members discussed the meaning of relapse and shared personal stories of relapse, how it affected them and others, and how they perceived themselves during this time. Group members were encouraged to identify triggers, warning signs and coping skills used when facing the possibility of relapse. Social supports were discussed and explored in detail.   Dalissa Lovin L Tereasa Yilmaz  MSW, LCSWA   01/28/2016, 2:47 PM  

## 2016-01-28 NOTE — Plan of Care (Signed)
Problem: Alteration in thought process Goal: LTG-Patient behavior demonstrates decreased signs psychosis (Patient behavior demonstrates decreased signs of psychosis to the point the patient is safe to return home and continue treatment in an outpatient setting.)  Outcome: Progressing Decreased psychosis.      

## 2016-01-28 NOTE — Progress Notes (Signed)
D: Pt denies SI/HI/AVH. Pt is pleasant and cooperative. Pt appears less anxious and he is interacting with peers and staff appropriately.  A: Pt was offered support and encouragement. Pt was given scheduled medications. Pt was encouraged to attend groups. Q 15 minute checks were done for safety.  R:Pt did not attend group. Pt is taking medication. Pt receptive to treatment and safety maintained on unit.

## 2016-01-28 NOTE — [facility] Group Notes (Signed)
[facility] [name] Aftercare Discharge Planning Group Note   01/28/2016 11:35 AM  Patient did not attend.  Jenel Lucks, CSW Intern 01/28/16

## 2016-01-29 MED ORDER — ALBUTEROL SULFATE (2.5 MG/3ML) 0.083% IN NEBU: 3.0000 mL | INHALATION_SOLUTION | RESPIRATORY_TRACT | Status: DC | PRN

## 2016-01-29 NOTE — Progress Notes (Signed)
D: Patient unkept in appearance , clothes dirty, no ADL'S this shift  Patient positioned himself as a know it all . Flirtatious  With  Male staff . Appetite good and voice no concerns around sleep . No angry outburst this shift . Noted to get along with his peers and staff. Denies suicidal ideations  A: Encourage patient participation with unit programming . Instruction  Given on  Medication , verbalize understanding. R: Voice no other concerns. Staff continue to monitor

## 2016-01-29 NOTE — Plan of Care (Signed)
Problem: Ineffective individual coping Goal: LTG: Patient will report a decrease in negative feelings Outcome: Progressing Limited insight  Into behavior redirected by staff
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
## 2016-01-29 NOTE — Plan of Care (Signed)
Problem: Ineffective individual coping Goal: STG: Patient will remain free from self harm Outcome: Progressing Pt remains free from harm  Problem: Aggression Towards others,Towards Self, and or Destruction Goal: STG-Patient will comply with prescribed medication regimen (Patient will comply with prescribed medication regimen)  Outcome: Progressing Pt taking medications as prescribed     

## 2016-01-29 NOTE — [facility] Group Notes (Signed)
[facility] [name] Group Therapy  01/29/2016 3:25 PM  Type of Therapy:  Group Therapy  Participation Level:  Did Not Attend  Modes of Intervention:  Discussion, Education, Socialization and Support  Summary of Progress/Problems: Boundaries: Patients defined boundaries and discussed the importance of them. Patients identified their own boundaries and how they feel when they are crossed. Patients discussed ways to create and/ or improve their personal boundaries.   Avyana Puffenbarger L Odella Appelhans MSW, LCSWA  01/29/2016, 3:25 PM  
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
## 2016-01-30 LAB — VALPROIC ACID LEVEL: Valproic Acid Lvl: 101 ug/mL — ABNORMAL HIGH (ref 50.0–100.0)

## 2016-01-30 NOTE — [facility] Group Notes (Signed)
[facility] Group Notes:  (Nursing/MHT/Case Management/Adjunct)  Date:  01/30/2016  Time:  8:30 AM  Type of Therapy:  Community Meeting   Participation Level:  Did Not Attend  Janzen Sacks De'Chelle Jarid Sasso 01/30/2016, 6:14 PM 
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
## 2016-01-30 NOTE — Progress Notes (Signed)
1940: patient is visible in the milieu, frequently walking around and asking to speak to his nurse. Can be intrusive to staff. Disheveled with bizarre behavior but not  Aggressive. Has no concern and staff continue to monitor.
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
## 2016-01-30 NOTE — Plan of Care (Signed)
Problem: Ineffective individual coping Goal: STG: Patient will remain free from self harm Outcome: Progressing Pt. Med compliant but remained intrusive at times and very talkative.

## 2016-01-30 NOTE — Plan of Care (Signed)
Problem: Alteration in thought process Goal: STG-Patient is able to sleep at least 6 hours per night Outcome: Progressing Patient slept 7.75 hours previous night.     

## 2016-01-31 MED ORDER — HALOPERIDOL 10 MG PO TABS: 10.0000 mg | ORAL_TABLET | Freq: Two times a day (BID) | ORAL | Status: DC

## 2016-01-31 MED ORDER — DIVALPROEX SODIUM ER 500 MG PO TB24: 2000.0000 mg | ORAL_TABLET | Freq: Every day | ORAL | Status: DC

## 2016-01-31 MED ORDER — HALOPERIDOL DECANOATE 100 MG/ML IM SOLN: 250.0000 mg | INTRAMUSCULAR | Status: DC

## 2016-01-31 MED ORDER — AMANTADINE HCL 100 MG PO CAPS: 100.0000 mg | ORAL_CAPSULE | Freq: Two times a day (BID) | ORAL | Status: DC

## 2016-01-31 MED ORDER — CLONAZEPAM 1 MG PO TABS: 1.0000 mg | ORAL_TABLET | Freq: Every day | ORAL | Status: DC

## 2016-01-31 MED ORDER — ALBUTEROL SULFATE (2.5 MG/3ML) 0.083% IN NEBU: 3.0000 mL | INHALATION_SOLUTION | RESPIRATORY_TRACT | Status: DC | PRN

## 2016-01-31 NOTE — Progress Notes (Signed)
Recreation Therapy Notes  Date: 02.06.17 Time: 3:00 pm Location: Craft Room  Group Topic: Wellness  Goal Area(s) Addresses:  Patient will identify at least one item per dimension of health. Patient will examine areas they are deficient in.  Behavioral Response: Did not attend  Intervention: 6 Dimensions of Health  Activity: Patients were given a sheets with the definitions of the 6 Dimensions of Health on it and a worksheet with the 6 Dimensions written out. Patients were instructed to write 2-3 things they are currently doing in each category.  Education: LRT educated patients on ways they can increase areas they are deficient in.  Education Outcome: Patient did not attend group.  Clinical Observations/Feedback: Patient did not attend group.  Houa Ackert M, LRT/CTRS 01/31/2016 4:16 PM 

## 2016-01-31 NOTE — Plan of Care (Signed)
Problem: Ineffective individual coping Goal: LTG: Patient will report a decrease in negative feelings Outcome: Progressing Patient pleasant and cooperative, denying SI/HI
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
## 2016-01-31 NOTE — [facility] Group Notes (Signed)
[facility] Group Notes:  (Nursing/MHT/Case Management/Adjunct)  Date:  01/31/2016  Time:  1:19 PM  Type of Therapy:  Psychoeducational Skills  Participation Level:  Did Not Attend   Mickey Farber 01/31/2016, 1:19 PM

## 2016-01-31 NOTE — Plan of Care (Signed)
Problem: Alteration in thought process Goal: LTG-Patient behavior demonstrates decreased signs psychosis (Patient behavior demonstrates decreased signs of psychosis to the point the patient is safe to return home and continue treatment in an outpatient setting.)  Outcome: Progressing Patient's thought process is clear and organized

## 2016-01-31 NOTE — [facility] Suicide Risk Assessment (Addendum)
[facility] Discharge Suicide Risk Assessment   Principal Problem: Schizophrenia [facility]) Discharge Diagnoses:  Patient Active Problem List   Diagnosis Date Noted  . Schizophrenia ([facility]) [F20.9] 01/25/2016  . Tardive dyskinesia [G24.01] 09/15/2015  . Antisocial traits [F60.2] 09/15/2015  . Tobacco use disorder [F17.200] 09/14/2015  . Cannabis use disorder, moderate, dependence ([facility]) [F12.20] 09/14/2015    Total Time spent with patient: 30 minutes   Psychiatric Specialty Exam: ROS                                                         Mental Status Per Nursing Assessment::   On Admission:  NA  Demographic Factors:  Male  Loss Factors: NA  Historical Factors: Impulsivity and aggression, poor compliance  Risk Reduction Factors:   Positive social support  Continued Clinical Symptoms:  Schizophrenia:   Less than 71 years old Paranoid or undifferentiated type Previous Psychiatric Diagnoses and Treatments  Cognitive Features That Contribute To Risk:  Closed-mindedness    Suicide Risk:  Minimal: No identifiable suicidal ideation.  Patients presenting with no risk factors but with morbid ruminations; may be classified as minimal risk based on the severity of the depressive symptoms  Follow-up Information    Follow up with [facility].   Why:  Please call Liborio Nixon of the [facility] to set up your hospital follow up appointment for medication managment and therapy   Contact information:   [address] [place], [place] [address] Ph: [phone] Fax: [phone]      Jimmy Footman, MD 02/01/2016, 8:58 AM

## 2016-01-31 NOTE — [facility] Group Notes (Signed)
[facility] [name] Group Therapy  01/31/2016 3:34 PM  Type of Therapy:  Group Therapy  Participation Level:  Did Not Attend  Summary of Progress/Problems: Patient was called to group but did not attend.  Lulu Riding, MSW, LCSWA 01/31/2016, 3:34 PM

## 2016-01-31 NOTE — [facility] Group Notes (Addendum)
[facility] [name] Aftercare Discharge Planning Group Note   01/31/2016 12:49 PM  Participation Quality:  Patient attended group and particiapted but was distracting at time however was redirectable sharing his SMART goal is to "win the lottery and give back to the poor. Patient left group after 20 minutes and did return with 5 minutes remaining. No other participation in today's group.    Lulu Riding, MSW, LCSWA

## 2016-01-31 NOTE — Progress Notes (Signed)
0600: Patient stayed in bed and slept throughout the night. Had no concern and safety maintained.
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
## 2016-02-01 NOTE — Plan of Care (Signed)
Problem: Alteration in thought process Goal: LTG-Patient is able to perceive the environment accurately Outcome: Progressing When asked to come to the med room the patient knows where to go. He also knows where to go when it's time for group.

## 2016-02-01 NOTE — Progress Notes (Signed)
  [facility] Adult Case Management Discharge Plan :  Will you be returning to the same living situation after discharge:  No.[facility] is new group home placement At discharge, do you have transportation home?: Yes,    Do you have the ability to pay for your medications: Yes,     Release of information consent forms completed and in the chart;  Patient's signature needed at discharge.  Patient to Follow up at: Follow-up Information    Follow up with [facility].   Why:  Please call Liborio Nixon of the [facility] to set up your hospital follow up appointment for medication managment and therapy   Contact information:   [address] [place], [place] [address] Ph: [phone] Fax: [phone]      Next level of care provider has access to [facility] Link:no  Safety Planning and Suicide Prevention discussed: Yes,     Have you used any form of tobacco in the last 30 days? (Cigarettes, Smokeless Tobacco, Cigars, and/or Pipes): Yes  Has patient been referred to the Quitline?: Patient refused referral  Patient has been referred for addiction treatment: N/A  Glennon Mac, MSW, [name] 02/01/2016, 5:09 PM

## 2016-02-01 NOTE — Progress Notes (Signed)
Recreation Therapy Notes  INPATIENT RECREATION TR PLAN  Patient Details Name: Peter Wilson MRN: [id number] DOB: 11/20/1976 Today's Date: 02/01/2016  Rec Therapy Plan Is patient appropriate for Therapeutic Recreation?: Yes Treatment times per week: At least once a week TR Treatment/Interventions: 1:1 session, Group participation (Comment) (Appropriate participation in daily recreation therapy tx)  Discharge Criteria Pt will be discharged from therapy if:: Treatment goals are met, Discharged Treatment plan/goals/alternatives discussed and agreed upon by:: Patient/family  Discharge Summary Short term goals set: See Care Plan Short term goals met: Complete Progress toward goals comments: One-to-one attended Which groups?: Self-esteem, Leisure education, Coping skills, Social skills One-to-one attended: Decision making, stress management Reason goals not met: N/A Therapeutic equipment acquired: None Reason patient discharged from therapy: Discharge from hospital Pt/family agrees with progress & goals achieved: Yes Date patient discharged from therapy: 02/01/16   Leonette Monarch, LRT/CTRS 02/01/2016, 2:37 PM

## 2016-02-01 NOTE — [facility] Group Notes (Signed)
[facility] Group Notes:  (Nursing/MHT/Case Management/Adjunct)  Date:  02/01/2016  Time:  4:03 AM  Type of Therapy:  Group Therapy  Participation Level:  Active  Participation Quality:  Redirectable  Affect:  Anxious and Defensive  Cognitive:  Alert  Insight:  Limited  Engagement in Group:  Off Topic  Modes of Intervention:  n/a  Summary of Progress/Problems:PT left group early after stating his name was "[employer]" and that he was going to win the lottery.   Fanny Skates Jasmyne Lodato 02/01/2016, 4:03 AM

## 2016-02-01 NOTE — [place] FL2 (Signed)
  [place] MEDICAID FL2 LEVEL OF CARE SCREENING TOOL     IDENTIFICATION  Patient Name: Peter Wilson Birthdate: 04-05-1976 Sex: male Admission Date (Current Location): 01/24/2016  [place] and [place] Number:  [name] [id number] [facility] Facility and Address:  [facility], [address], [place], [place] [address]      Provider Number: [id number]  Attending Physician Name and Address:  Jimmy Footman  Relative Name and Phone Number:  [name], [name] (Mother)  [phone]    Current Level of Care: Hospital Recommended Level of Care: Other (Comment) Prior Approval Number:    Date Approved/Denied: 09/20/15 PASRR Number: [id number]  Discharge Plan: Other (Comment)    Current Diagnoses: Patient Active Problem List   Diagnosis Date Noted  . Schizophrenia (HCC) 01/25/2016  . Tardive dyskinesia 09/15/2015  . Antisocial traits 09/15/2015  . Tobacco use disorder 09/14/2015  . Cannabis use disorder, moderate, dependence (HCC) 09/14/2015    Orientation RESPIRATION BLADDER Height & Weight     Self, Time, Place  Normal Continent Weight: 170 lb (77.111 kg) Height:  6' (182.9 cm)  BEHAVIORAL SYMPTOMS/MOOD NEUROLOGICAL BOWEL NUTRITION STATUS  Verbally abusive  (None) Continent  (Normal )  AMBULATORY STATUS COMMUNICATION OF NEEDS Skin   Independent Verbally Normal                       Personal Care Assistance Level of Assistance  Bathing, Feeding, Dressing, Total care Bathing Assistance: Independent Feeding assistance: Independent Dressing Assistance: Independent Total Care Assistance: Independent   Functional Limitations Info  Sight, Hearing, Speech Sight Info: Adequate Hearing Info: Adequate Speech Info: Adequate    SPECIAL CARE FACTORS FREQUENCY         Contractures Contractures Info: Not present    Additional Factors Info  Psychotropic    Current Medications (02/01/2016):  This is the current hospital active medication  list Current Facility-Administered Medications  Medication Dose Route Frequency Provider Last Rate Last Dose  . acetaminophen (TYLENOL) tablet 650 mg  650 mg Oral Q6H PRN Audery Amel, MD   650 mg at 01/31/16 2105  . albuterol (PROVENTIL) (2.5 MG/3ML) 0.083% nebulizer solution 3 mL  3 mL Inhalation Q4H PRN Darliss Ridgel, MD      . alum & mag hydroxide-simeth (MAALOX/MYLANTA) 200-200-20 MG/5ML suspension 30 mL  30 mL Oral Q4H PRN Audery Amel, MD      . amantadine (SYMMETREL) capsule 100 mg  100 mg Oral BID Jimmy Footman, MD   100 mg at 02/01/16 0916  . divalproex (DEPAKOTE ER) 24 hr tablet 2,000 mg  2,000 mg Oral QHS Jimmy Footman, MD   2,000 mg at 01/31/16 2105  . haloperidol (HALDOL) tablet 10 mg  10 mg Oral BID Jimmy Footman, MD   10 mg at 02/01/16 0916  . LORazepam (ATIVAN) tablet 2 mg  2 mg Oral Q4H PRN Audery Amel, MD   2 mg at 01/30/16 2144  . LORazepam (ATIVAN) tablet 2 mg  2 mg Oral QHS Jimmy Footman, MD   2 mg at 01/31/16 2105  . magnesium hydroxide (MILK OF MAGNESIA) suspension 30 mL  30 mL Oral Daily PRN Audery Amel, MD         Discharge Medications: Please see discharge summary for a list of discharge medications.  Relevant Imaging Results:  Relevant Lab Results: 01/25/2016-PPD Skin Test given, Read 01/27/2016, Result Negative   Additional Information  [name], [name], [name]

## 2016-02-01 NOTE — Progress Notes (Signed)
D:Patient aware of discharge this shift . Patient returning home . Patient received all belonging locked up . Patient denies  Suicidal  And homicidal ideations  .  A: Writer instructed on discharge criteria  .   FL2 and prescriptions  given to group home staff . Aware  Of follow up appointment . R: Patient left unit with no questions  Or concerns  With group home staff.  

## 2016-02-01 NOTE — Progress Notes (Signed)
D: Patient has been very interactive on the unit. He was wanting his medication early tonight and being somewhat argumentative with the MHT but was easily redirected. He went to group and took his medication after. States pain at a 5. He denies SI/HI/AVH. Stated he hasn't heard voices in 2 years.  A: Medication was given with education. Encouragement was provided. Patient was instructed that he needed to attend group and to not cause disruptions.  R: Patient was compliant with medication. He was easily redirected. Safety maintained with 15 min checks.

## 2016-02-01 NOTE — [facility] Group Notes (Signed)
[facility] Group Notes:  (Nursing/MHT/Case Management/Adjunct)  Date:  02/01/2016  Time:  2:09 PM  Type of Therapy:  Psychoeducational Skills  Participation Level:  Did Not Attend  Peter Wilson 02/01/2016, 2:09 PM

## 2016-02-01 NOTE — Tx Team (Signed)
Interdisciplinary Treatment Plan Update (Adult)  Date:  02/01/2016 Time Reviewed:  4:41 PM  Progress in Treatment: Improving  Attending groups: No Participating in groups: No Taking medication as prescribed: Yes  Tolerating medication: Yes  Family/Significant other contact made: No, refused Patient understands diagnosis: Yes  Discussing patient identified problems/goals with staff: Yes  Medical problems stabilized or resolved: Yes  Denies suicidal/homicidal ideation: Yes  Issues/concerns per patient self-inventory: Yes  Other:   New problem(s) identified: N/A   Discharge Plan or Barriers: Pt discharged to new group home, [facility] home with [facility] and will continue to follow up with [place] team  Reason for Continuation of Hospitalization:  Depression  Anxiety  Medication Stabilization   Comments: N/A   Estimated LOS: 0 days   Pt discharging today. Acute symptoms resolved.     Review of initial/current patient goals per problem list:  1. Goal(s): Patient will participate in aftercare plan   Met: No  Target date: 3-5 days post admiCSW continuing to assess. Pt new to milieussion date   As evidenced by: Patient will participate within aftercare plan AEB aftercare provider and housing plan at discharge being identified.  2/1: Goal progressing.  2/3: Goal progressing.  02/01/2016-Goal Met   2. Goal (s): Patient will exhibit decreased depressive symptoms and suicidal ideations.   Met: No  Target date: 3-5 days post admission date   As evidenced by: Patient will utilize self-rating of depression at 3 or below and demonstrate decreased signs of depression or be deemed stable for discharge by MD.  2/1: Goal progressing. Pt denies SI/HI. Pt reports he is safe for discharge. 2/3: Goal progressing. Pt denies SI/HI. Pt reports he is safe for  discharge. 02/01/2016-Goal met   3. Goal(s): Patient will demonstrate decreased signs and symptoms of anxiety.   Met: No  Target date: 3-5 days post admission date   As evidenced by: Patient will utilize self-rating of anxiety at 3 or below and demonstrated decreased signs of anxiety, or be deemed stable for discharge by MD 2/1: Goal progressing.  2/3: Goal progressing.   02/01/2016 Goal Met   4. Goal(s): Patient will demonstrate decreased signs of withdrawal due to substance abuse   Met: Yes  Target date: 3-5 days post admission date   As evidenced by: Patient will produce a CIWA/COWS score of 0, have stable vitals signs, and no symptoms of withdrawal  2/1: Per medical chart patient produced a CIWA/COWS score of 0, has stable vitals signs, and no symptoms of withdrawal   02/01/2016-Goal Met.  Attendees: Patient:  Peter Wilson 2/7/20174:41 PM  Family:   2/7/20174:41 PM  Physician:  Merlyn Albert 2/7/20174:41 PM  Nursing:   Mechele Collin, RN 2/7/20174:41 PM  Case Manager:   2/7/20174:41 PM  Counselor:  Dossie Arbour, [name] 2/7/20174:41 PM  Other:  Everitt Amber, LRT 2/7/20174:41 PM  Other:   2/7/20174:41 PM  Other:   2/7/20174:41 PM  Other:  2/7/20174:41 PM  Other:  2/7/20174:41 PM  Other:  2/7/20174:41 PM  Other:  2/7/20174:41 PM  Other:  2/7/20174:41 PM  Other:  2/7/20174:41 PM  Other:   2/7/20174:41 PM   Scribe for Treatment Team:   August Saucer, 02/01/2016, 4:41 PM, MSW, [name]

## 2016-02-17 ENCOUNTER — Encounter: Payer: Self-pay | Admitting: Emergency Medicine

## 2016-02-17 ENCOUNTER — Observation Stay
Admission: EM | Admit: 2016-02-17 | Discharge: 2016-02-18 | Payer: Medicare Other | Attending: Internal Medicine | Admitting: Internal Medicine

## 2016-02-17 DIAGNOSIS — F200 Paranoid schizophrenia: Secondary | ICD-10-CM | POA: Insufficient documentation

## 2016-02-17 DIAGNOSIS — K625 Hemorrhage of anus and rectum: Secondary | ICD-10-CM | POA: Diagnosis not present

## 2016-02-17 DIAGNOSIS — R410 Disorientation, unspecified: Secondary | ICD-10-CM | POA: Diagnosis present

## 2016-02-17 DIAGNOSIS — Z91011 Allergy to milk products: Secondary | ICD-10-CM | POA: Diagnosis not present

## 2016-02-17 DIAGNOSIS — E7220 Disorder of urea cycle metabolism, unspecified: Secondary | ICD-10-CM | POA: Insufficient documentation

## 2016-02-17 DIAGNOSIS — F17210 Nicotine dependence, cigarettes, uncomplicated: Secondary | ICD-10-CM | POA: Insufficient documentation

## 2016-02-17 DIAGNOSIS — G2401 Drug induced subacute dyskinesia: Secondary | ICD-10-CM | POA: Diagnosis not present

## 2016-02-17 DIAGNOSIS — T426X5A Adverse effect of other antiepileptic and sedative-hypnotic drugs, initial encounter: Principal | ICD-10-CM | POA: Insufficient documentation

## 2016-02-17 DIAGNOSIS — R109 Unspecified abdominal pain: Secondary | ICD-10-CM | POA: Diagnosis not present

## 2016-02-17 DIAGNOSIS — F1210 Cannabis abuse, uncomplicated: Secondary | ICD-10-CM | POA: Diagnosis not present

## 2016-02-17 HISTORY — DX: Drug induced subacute dyskinesia: G24.01

## 2016-02-17 HISTORY — DX: Tobacco use: Z72.0

## 2016-02-17 HISTORY — DX: Cannabis abuse, uncomplicated: F12.10

## 2016-02-17 LAB — VALPROIC ACID LEVEL: VALPROIC ACID LVL: 122 ug/mL — AB (ref 50.0–100.0)

## 2016-02-17 LAB — CBC
HEMATOCRIT: 40.9 % (ref 40.0–52.0)
HEMOGLOBIN: 13.4 g/dL (ref 13.0–18.0)
MCH: 29.8 pg (ref 26.0–34.0)
MCHC: 32.8 g/dL (ref 32.0–36.0)
MCV: 90.9 fL (ref 80.0–100.0)
Platelets: 163 10*3/uL (ref 150–440)
RBC: 4.5 MIL/uL (ref 4.40–5.90)
RDW: 14.8 % — ABNORMAL HIGH (ref 11.5–14.5)
WBC: 4.8 10*3/uL (ref 3.8–10.6)

## 2016-02-17 LAB — COMPREHENSIVE METABOLIC PANEL
ALK PHOS: 49 U/L (ref 38–126)
ALT: 11 U/L — AB (ref 17–63)
AST: 24 U/L (ref 15–41)
Albumin: 4.3 g/dL (ref 3.5–5.0)
Anion gap: 8 (ref 5–15)
BILIRUBIN TOTAL: 0.8 mg/dL (ref 0.3–1.2)
BUN: 9 mg/dL (ref 6–20)
CALCIUM: 9.4 mg/dL (ref 8.9–10.3)
CO2: 29 mmol/L (ref 22–32)
CREATININE: 1.03 mg/dL (ref 0.61–1.24)
Chloride: 103 mmol/L (ref 101–111)
GFR calc non Af Amer: >60 mL/min (ref >60)
Glucose, Bld: 114 mg/dL — ABNORMAL HIGH (ref 65–99)
Potassium: 4.1 mmol/L (ref 3.5–5.1)
SODIUM: 140 mmol/L (ref 135–145)
TOTAL PROTEIN: 8.3 g/dL — AB (ref 6.5–8.1)

## 2016-02-17 LAB — AMMONIA: Ammonia: 65 umol/L — ABNORMAL HIGH (ref 9–35)

## 2016-02-17 MED ORDER — ONDANSETRON HCL 4 MG PO TABS
4.0000 mg | ORAL_TABLET | Freq: Four times a day (QID) | ORAL | Status: DC | PRN
Start: 2016-02-17 — End: 2016-02-18

## 2016-02-17 MED ORDER — SODIUM CHLORIDE 0.9% FLUSH: 3.0000 mL | INTRAVENOUS | Status: DC | PRN

## 2016-02-17 MED ORDER — ACETAMINOPHEN 650 MG RE SUPP: 650.0000 mg | Freq: Four times a day (QID) | RECTAL | Status: DC | PRN

## 2016-02-17 MED ORDER — POLYETHYLENE GLYCOL 3350 17 G PO PACK: 17.0000 g | PACK | Freq: Every day | ORAL | Status: DC | PRN

## 2016-02-17 MED ORDER — SODIUM CHLORIDE 0.9% FLUSH
3.0000 mL | Freq: Two times a day (BID) | INTRAVENOUS | Status: DC
  Administered 2016-02-18 (×2): 3 mL via INTRAVENOUS

## 2016-02-17 MED ORDER — ACETAMINOPHEN 325 MG PO TABS: 650.0000 mg | ORAL_TABLET | Freq: Four times a day (QID) | ORAL | Status: DC | PRN

## 2016-02-17 MED ORDER — LACTULOSE 10 GM/15ML PO SOLN
30.0000 g | Freq: Once | ORAL | Status: AC
  Administered 2016-02-17: 30 g via ORAL
  Filled 2016-02-17: qty 60

## 2016-02-17 MED ORDER — ENOXAPARIN SODIUM 40 MG/0.4ML ~~LOC~~ SOLN: 40.0000 mg | SUBCUTANEOUS | Status: DC

## 2016-02-17 MED ORDER — ONDANSETRON HCL 4 MG/2ML IJ SOLN: 4.0000 mg | Freq: Four times a day (QID) | INTRAMUSCULAR | Status: DC | PRN

## 2016-02-17 MED ORDER — SODIUM CHLORIDE 0.9 % IV BOLUS (SEPSIS)
1000.0000 mL | Freq: Once | INTRAVENOUS | Status: AC
  Administered 2016-02-17: 1000 mL via INTRAVENOUS

## 2016-02-17 MED ORDER — SODIUM CHLORIDE 0.9 % IV SOLN: 250.0000 mL | INTRAVENOUS | Status: DC | PRN

## 2016-02-17 NOTE — ED Notes (Signed)
PCP called and reported ammonia level was 144.

## 2016-02-17 NOTE — ED Notes (Addendum)
Pt reports had labs drawn yesterday at PCP; reports high ammonia. Pt's caregiver reports pt with blood in stomach, supposed to have CT scan in the morning. Pt also needs depakote level checked.

## 2016-02-17 NOTE — ED Provider Notes (Signed)
[facility] Emergency Department Provider Note    ____________________________________________  Time seen: ~2130  I have reviewed the triage vital signs and the nursing notes.   HISTORY  Chief Complaint Abnormal Lab   History limited by: Not Limited   HPI Peter Wilson is a 40 y.o. male who presented to the emergency department today because of concerns for elevated ammonia level found by primary care physician. Patient does have a history of schizophrenia and is currently on Dilantin. He had a recent hospitalization. His Dilantin had been increased. According to the crisis team they have noticed an increase of confusion. This has increased over the past week. The patient is also had been having slightly more difficult time with ambulation. The patient himself states he has had a small amount of abdominal pain. No fevers.     Past Medical History  Diagnosis Date  . Schizophrenia, paranoid [facility])     Patient Active Problem List   Diagnosis Date Noted  . Schizophrenia (HCC) 01/25/2016  . Tardive dyskinesia 09/15/2015  . Antisocial traits 09/15/2015  . Tobacco use disorder 09/14/2015  . Cannabis use disorder, moderate, dependence (HCC) 09/14/2015    Past Surgical History  Procedure Laterality Date  . No past surgeries      Current Outpatient Rx  Name  Route  Sig  Dispense  Refill  . albuterol (PROVENTIL) (2.5 MG/3ML) 0.083% nebulizer solution   Inhalation   Inhale 3 mLs into the lungs every 4 (four) hours as needed for wheezing or shortness of breath.   75 mL   0   . amantadine (SYMMETREL) 100 MG capsule   Oral   Take 1 capsule (100 mg total) by mouth 2 (two) times daily.   60 capsule   0   . clonazePAM (KLONOPIN) 1 MG tablet   Oral   Take 1 tablet (1 mg total) by mouth at bedtime.   30 tablet   0   . divalproex (DEPAKOTE ER) 500 MG 24 hr tablet   Oral   Take 4 tablets (2,000 mg total) by mouth at bedtime.   30 tablet   0   .  haloperidol (HALDOL) 10 MG tablet   Oral   Take 1 tablet (10 mg total) by mouth 2 (two) times daily.   60 tablet   0   . haloperidol decanoate (HALDOL DECANOATE) 100 MG/ML injection   Intramuscular   Inject 2.5 mLs (250 mg total) into the muscle every 30 (thirty) days.   1 mL   0     Next inj Due on Feb 28 250 mg IM     Allergies Milk-related compounds  No family history on file.  Social History Social History  Substance Use Topics  . Smoking status: Current Every Day Smoker -- 2.00 packs/day    Types: Cigarettes  . Smokeless tobacco: None  . Alcohol Use: No    Review of Systems  Constitutional: Negative for fever. Cardiovascular: Negative for chest pain. Respiratory: Negative for shortness of breath. Gastrointestinal: Positive for abdominal pain. Neurological: Negative for headaches, focal weakness or numbness.  10-point ROS otherwise negative.  ____________________________________________   PHYSICAL EXAM:  VITAL SIGNS: ED Triage Vitals  Enc Vitals Group     BP 02/17/16 1832 136/55 mmHg     Pulse Rate 02/17/16 1832 77     Resp 02/17/16 1832 16     Temp 02/17/16 1832 98.1 F (36.7 C)     Temp Source 02/17/16 1832 Oral  SpO2 02/17/16 1832 97 %     Weight --      Height 02/17/16 1832 6' (1.829 m)     Head Cir --      Peak Flow --      Pain Score 02/17/16 1832 0   Constitutional: Alert and oriented. Well appearing and in no distress. Eyes: Conjunctivae are normal. PERRL. Normal extraocular movements. ENT   Head: Normocephalic and atraumatic.   Nose: No congestion/rhinnorhea.   Mouth/Throat: Mucous membranes are moist.   Neck: No stridor. Hematological/Lymphatic/Immunilogical: No cervical lymphadenopathy. Cardiovascular: Normal rate, regular rhythm.  No murmurs, rubs, or gallops. Respiratory: Normal respiratory effort without tachypnea nor retractions. Breath sounds are clear and equal bilaterally. No  wheezes/rales/rhonchi. Gastrointestinal: Soft and nontender. No distention.  Genitourinary: Deferred Musculoskeletal: Normal range of motion in all extremities. No joint effusions.  No lower extremity tenderness nor edema. Neurologic:  Normal speech and language. No gross focal neurologic deficits are appreciated.  Skin:  Skin is warm, dry and intact. No rash noted. Psychiatric: Slightly flat affect.   ____________________________________________    LABS (pertinent positives/negatives)  Labs Reviewed  CBC - Abnormal; Notable for the following:    RDW 14.8 (*)    All other components within normal limits  COMPREHENSIVE METABOLIC PANEL - Abnormal; Notable for the following:    Glucose, Bld 114 (*)    Total Protein 8.3 (*)    ALT 11 (*)    All other components within normal limits  AMMONIA - Abnormal; Notable for the following:    Ammonia 65 (*)    All other components within normal limits  VALPROIC ACID LEVEL - Abnormal; Notable for the following:    Valproic Acid Lvl 122 (*)    All other components within normal limits     ____________________________________________   EKG  I, Phineas Semen, attending physician, personally viewed and interpreted this EKG  EKG Time: 2135 Rate: 60 Rhythm: normal sinus rhythm Axis: normal Intervals: qtc 384 QRS: narrow, q waves II, III, aVF ST changes: no st elevation Impression: abnormal ekg   ____________________________________________    RADIOLOGY  None   ____________________________________________   PROCEDURES  Procedure(s) performed: None  Critical Care performed: No  ____________________________________________   INITIAL IMPRESSION / ASSESSMENT AND PLAN / ED COURSE  Pertinent labs & imaging results that were available during my care of the patient were reviewed by me and considered in my medical decision making (see chart for details).  Patient presents because of concern for abnormal lab value. Ammonia  and depakote were both elevated. Talked with crises team who state they have noticed increased confusion. Will give lactulose. Will plan on admission to the emergency department.   ____________________________________________   FINAL CLINICAL IMPRESSION(S) / ED DIAGNOSES  Final diagnoses:  Confusion  Hyperammonemia (HCC)     Phineas Semen, MD 02/17/16 2307

## 2016-02-17 NOTE — H&P (Signed)
[facility] - [place] [facility]   PATIENT NAME: Peter Wilson    MR#:  [id number]  DATE OF BIRTH:  06/23/1976  DATE OF ADMISSION:  02/17/2016  PRIMARY CARE PHYSICIAN: No PCP Per Patient   REQUESTING/REFERRING PHYSICIAN: Dr. Derrill Kay  CHIEF COMPLAINT:   Chief Complaint  Patient presents with  . Abnormal Lab    HISTORY OF PRESENT ILLNESS:  Peter Wilson  is a 40 y.o. male with a known history of schizophrenia and tardive dyskinesia presents from group home after routine labs checked by his primary care physician showed ammonia level elevated. Ammonia level repeated in the emergency room is high at 65. Depakote level is 122. Patient has been having some bright red blood per rectum for 2 weeks for which he was supposed to get a CT scan of the abdomen tomorrow. Hemoglobin is stable. He complains of some epigastric tenderness. Does not take any blood thinners or NSAIDs. No recent change in medications. No history of liver disease.  PAST MEDICAL HISTORY:   Past Medical History  Diagnosis Date  . Schizophrenia, paranoid (HCC)   . Tardive dyskinesia   . Tobacco abuse   . Marijuana abuse     PAST SURGICAL HISTORY:   Past Surgical History  Procedure Laterality Date  . No past surgeries      SOCIAL HISTORY:   Social History  Substance Use Topics  . Smoking status: Current Every Day Smoker -- 2.00 packs/day    Types: Cigarettes  . Smokeless tobacco: Not on file  . Alcohol Use: No    FAMILY HISTORY:  No family history on file. Unknown. Patient not aware. Poor historian DRUG ALLERGIES:   Allergies  Allergen Reactions  . Milk-Related Compounds Hives    REVIEW OF SYSTEMS:   Review of Systems  Constitutional: Positive for malaise/fatigue. Negative for fever, chills and weight loss.  HENT: Negative for hearing loss and nosebleeds.   Eyes: Negative for blurred vision, double vision and pain.  Respiratory: Negative for cough, hemoptysis, sputum  production, shortness of breath and wheezing.   Cardiovascular: Negative for chest pain, palpitations, orthopnea and leg swelling.  Gastrointestinal: Positive for nausea, abdominal pain and blood in stool. Negative for vomiting, diarrhea and constipation.  Genitourinary: Negative for dysuria and hematuria.  Musculoskeletal: Negative for myalgias, back pain and falls.  Skin: Negative for rash.  Neurological: Negative for dizziness, tremors, sensory change, speech change, focal weakness, seizures and headaches.  Endo/Heme/Allergies: Does not bruise/bleed easily.  Psychiatric/Behavioral: Negative for depression and memory loss. The patient is not nervous/anxious.     MEDICATIONS AT HOME:   Prior to Admission medications   Medication Sig Start Date End Date Taking? Authorizing Provider  albuterol (PROVENTIL) (2.5 MG/3ML) 0.083% nebulizer solution Inhale 3 mLs into the lungs every 4 (four) hours as needed for wheezing or shortness of breath. 01/31/16  Yes Jimmy Footman, MD  amantadine (SYMMETREL) 100 MG capsule Take 1 capsule (100 mg total) by mouth 2 (two) times daily. 01/31/16  Yes Jimmy Footman, MD  clonazePAM (KLONOPIN) 1 MG tablet Take 1 tablet (1 mg total) by mouth at bedtime. 01/31/16  Yes Jimmy Footman, MD  divalproex (DEPAKOTE ER) 500 MG 24 hr tablet Take 4 tablets (2,000 mg total) by mouth at bedtime. 01/31/16  Yes Jimmy Footman, MD  haloperidol (HALDOL) 10 MG tablet Take 1 tablet (10 mg total) by mouth 2 (two) times daily. Patient taking differently: Take 10 mg by mouth 2 (two) times daily. Did not take today  due to blood work 02/17/2016 01/31/16  Yes Jimmy Footman, MD  haloperidol decanoate (HALDOL DECANOATE) 100 MG/ML injection Inject 2.5 mLs (250 mg total) into the muscle every 30 (thirty) days. 02/22/16  Yes Jimmy Footman, MD     VITAL SIGNS:  Blood pressure 121/80, pulse 65, temperature 98.1 F (36.7 C), temperature  source Oral, resp. rate 20, height 6' (1.829 m), SpO2 97 %.  PHYSICAL EXAMINATION:  Physical Exam  GENERAL:  40 y.o.-year-old patient lying in the bed with no acute distress.  EYES: Pupils equal, round, reactive to light and accommodation. No scleral icterus. Extraocular muscles intact.  HEENT: Head atraumatic, normocephalic. Oropharynx and nasopharynx clear. No oropharyngeal erythema, moist oral mucosa  NECK:  Supple, no jugular venous distention. No thyroid enlargement, no tenderness.  LUNGS: Normal breath sounds bilaterally, no wheezing, rales, rhonchi. No use of accessory muscles of respiration.  CARDIOVASCULAR: S1, S2 normal. No murmurs, rubs, or gallops.  ABDOMEN: Soft, nondistended. Bowel sounds present. No organomegaly or mass. Epigastric tenderness EXTREMITIES: No pedal edema, cyanosis, or clubbing. + 2 pedal & radial pulses b/l.   NEUROLOGIC: Cranial nerves II through XII are intact. No focal Motor or sensory deficits appreciated b/l. No asterixis PSYCHIATRIC: The patient is alert and awake. Slow to answer questions. SKIN: No obvious rash, lesion, or ulcer.   LABORATORY PANEL:   CBC  Recent Labs Lab 02/17/16 1836  WBC 4.8  HGB 13.4  HCT 40.9  PLT 163   ------------------------------------------------------------------------------------------------------------------  Chemistries   Recent Labs Lab 02/17/16 1836  NA 140  K 4.1  CL 103  CO2 29  GLUCOSE 114*  BUN 9  CREATININE 1.03  CALCIUM 9.4  AST 24  ALT 11*  ALKPHOS 49  BILITOT 0.8   ------------------------------------------------------------------------------------------------------------------  Cardiac Enzymes No results for input(s): TROPONINI in the last 168 hours. ------------------------------------------------------------------------------------------------------------------  RADIOLOGY:  No results found.   IMPRESSION AND PLAN:   * Hyperammonemia This is likely from using  valproate. Patient has received 1 dose of lactulose in the emergency room. Repeat ammonia in the morning. His liver enzymes are normal. Check ultrasound right upper quadrant. Will need outpatient GI follow-up  * Bright red blood per rectum This has been going on for last 2 weeks. Hemoglobin is stable. GI follow-up as outpatient  * Schizophrenia Continue home medications  * DVT prophylaxis with SCDs  All the records are reviewed and case discussed with ED provider. Management plans discussed with the patient, family and they are in agreement.  CODE STATUS: FULL  TOTAL TIME TAKING CARE OF THIS PATIENT: 40 minutes.   Milagros Loll R M.D on 02/17/2016 at 11:09 PM  Between 7am to 6pm - Pager - [phone]  After 6pm go to [email or web address] - password [email or web address]  [facility]  Office  [phone]  CC: Primary care physician; No PCP Per Patient  Note: This dictation was prepared with Dragon dictation along with smaller phrase technology. Any transcriptional errors that result from this process are unintentional.

## 2016-02-17 NOTE — ED Notes (Signed)
Patient to desk to inquire about wait times. RN checked board and informed patient that there were 2 more patients in front of him. Labs reviewed about patient sat down. Appreciated that Ammonia level not back. Call placed to lab; advised that they everything had resulted except this and they didn't know why; requesting new sample.

## 2016-02-18 DIAGNOSIS — F203 Undifferentiated schizophrenia: Secondary | ICD-10-CM | POA: Diagnosis not present

## 2016-02-18 LAB — VALPROIC ACID LEVEL: VALPROIC ACID LVL: 74 ug/mL (ref 50.0–100.0)

## 2016-02-18 LAB — HEMOGLOBIN: HEMOGLOBIN: 12.4 g/dL — AB (ref 13.0–18.0)

## 2016-02-18 LAB — AMMONIA: Ammonia: 33 umol/L (ref 9–35)

## 2016-02-18 MED ORDER — CLONAZEPAM 1 MG PO TABS
1.0000 mg | ORAL_TABLET | Freq: Every day | ORAL | Status: DC
  Administered 2016-02-18: 1 mg via ORAL
  Filled 2016-02-18: qty 1

## 2016-02-18 MED ORDER — LACTULOSE 10 GM/15ML PO SOLN: 20.0000 g | Freq: Every day | ORAL | Status: DC

## 2016-02-18 MED ORDER — HALOPERIDOL 5 MG PO TABS: 10.0000 mg | ORAL_TABLET | Freq: Two times a day (BID) | ORAL | Status: DC

## 2016-02-18 MED ORDER — DIVALPROEX SODIUM ER 500 MG PO TB24: 2000.0000 mg | ORAL_TABLET | Freq: Every day | ORAL | Status: DC

## 2016-02-18 MED ORDER — NICOTINE 14 MG/24HR TD PT24
14.0000 mg | MEDICATED_PATCH | Freq: Every day | TRANSDERMAL | Status: DC
  Administered 2016-02-18: 14 mg via TRANSDERMAL
  Filled 2016-02-18: qty 1

## 2016-02-18 MED ORDER — DIVALPROEX SODIUM ER 500 MG PO TB24: 1500.0000 mg | ORAL_TABLET | Freq: Every day | ORAL | Status: DC

## 2016-02-18 MED ORDER — ALBUTEROL SULFATE (2.5 MG/3ML) 0.083% IN NEBU: 3.0000 mL | INHALATION_SOLUTION | RESPIRATORY_TRACT | Status: DC | PRN

## 2016-02-18 MED ORDER — LORAZEPAM 2 MG/ML IJ SOLN
1.0000 mg | Freq: Four times a day (QID) | INTRAMUSCULAR | Status: DC | PRN
  Administered 2016-02-18: 1 mg via INTRAVENOUS
  Filled 2016-02-18 (×2): qty 1

## 2016-02-18 MED ORDER — AMANTADINE HCL 100 MG PO CAPS
100.0000 mg | ORAL_CAPSULE | Freq: Two times a day (BID) | ORAL | Status: DC
  Filled 2016-02-18 (×2): qty 1

## 2016-02-18 NOTE — Care Management (Signed)
RNCM consult placed to patient being from a group home.  CSW aware.  RNCM signing off

## 2016-02-18 NOTE — Clinical Social Work Note (Signed)
Patient excited about getting to go back to his family care home. [facility] coming to transport him today. York Spaniel MSW,[name] [phone]

## 2016-02-18 NOTE — Progress Notes (Signed)
Pt discharged back to group home. Gave information regarding discharge to [facility] group home leader. IV site removed.

## 2016-02-18 NOTE — Care Management Obs Status (Signed)
MEDICARE OBSERVATION STATUS NOTIFICATION   Patient Details  Name: Peter Wilson MRN: [id number] Date of Birth: January 29, 1976   Medicare Observation Status Notification Given:  Yes Reviewed with patient. Signed and sent to HIM   Chapman Fitch, RN 02/18/2016, 2:22 PM

## 2016-02-18 NOTE — Progress Notes (Signed)
Pt still agitated, cursing, and throwing items. Stating he is ready to leave and wants to smoke a cigarette. Gave pt ativan, had pt sign AMA paper, and asked pt to stay to at least until 10 am when medication has been safely absorbed. Pt continues to walk in hall and be disturbing. Dr. Allena Katz notified.  Continue to assess.

## 2016-02-18 NOTE — Progress Notes (Signed)
PT Cancellation Note  Patient Details Name: Peter Wilson MRN: [id number] DOB: Dec 24, 1976   Cancelled Treatment:    Reason Eval/Treat Not Completed: Other (comment). Consult received. Discussed with RN as pt is agitated at this time. Per RN, pt ambulatory in hallway. Will defer PT eval at this time.    Anielle Headrick 02/18/2016, 10:09 AM Elizabeth Palau, PT, DPT [phone]

## 2016-02-18 NOTE — Clinical Social Work Note (Signed)
Clinical Social Work Assessment  Patient Details  Name: Peter Wilson MRN: [id number] Date of Birth: 1976/09/17  Date of referral:  02/18/16               Reason for consult:  Facility Placement                Permission sought to share information with:    Permission granted to share information::     Name::        Agency::     Relationship::     Contact Information:     Housing/Transportation Living arrangements for the past 2 months:  Group Home Source of Information:  Facility Patient Interpreter Needed:  None Criminal Activity/Legal Involvement Pertinent to Current Situation/Hospitalization:  No - Comment as needed Significant Relationships:  Parents Lives with:  Facility Resident Do you feel safe going back to the place where you live?  Yes Need for family participation in patient care:     Care giving concerns:  Patient lives in a group home: [facility] owned by Gregory: [phone].   Social Worker assessment / plan:  Patient has been pacing and wanting to leave AMA since time of arriving to medical floor. Patient has been agitated and has not wanted to stay at hospital. Patient has had some medication to assist with his anxiety/frustration. MD states that she is having psychiatry to come and evaluate patient to make medication recommendations for his depakote management as his depakote levels were too high, she will discharge patient back to his family care home today.   CSW has spoken with the owner of the [facility]: Tameka and she stated that she will take patient back today and can come and pick him up when time.   Employment status:  Disabled (Comment on whether or not currently receiving Disability) Insurance information:  Medicare PT Recommendations:    Information / Referral to community resources:     Patient/Family's Response to care:  Patient currently calm and CSW will defer visiting with him at this time.  Patient/Family's Understanding of and Emotional  Response to Diagnosis, Current Treatment, and Prognosis:  Patient is wanting to discharge back to his group home today.  Emotional Assessment Appearance:  Appears stated age Attitude/Demeanor/Rapport:  Attention Seeking, Hostile, Unable to Assess Affect (typically observed):  Agitated Orientation:  Oriented to Self, Oriented to Place, Oriented to  Time, Oriented to Situation Alcohol / Substance use:  Not Applicable Psych involvement (Current and /or in the community):  Yes (Comment)  Discharge Needs  Concerns to be addressed:  Care Coordination Readmission within the last 30 days:  No Current discharge risk:  None Barriers to Discharge:  No Barriers Identified   York Spaniel, [name] 02/18/2016, 11:53 AM

## 2016-02-18 NOTE — NC FL2 (Signed)
[place] MEDICAID FL2 LEVEL OF CARE SCREENING TOOL     IDENTIFICATION  Patient Name: Peter Wilson Birthdate: 27-May-1976 Sex: male Admission Date (Current Location): 02/17/2016  [place] and [place] Number:  Chiropodist and Address:  [facility] [address] [place], [place] [address]      Provider Number: [id number]  Attending Physician Name and Address:  Enedina Finner, MD  Relative Name and Phone Number:       Current Level of Care: Hospital Recommended Level of Care: Skilled Nursing Facility Prior Approval Number:    Date Approved/Denied:   PASRR Number:    Discharge Plan:  (group home)    Current Diagnoses: Patient Active Problem List   Diagnosis Date Noted  . Undifferentiated schizophrenia (HCC)   . Hyperammonemia (HCC) 02/17/2016  . Schizophrenia (HCC) 01/25/2016  . Tardive dyskinesia 09/15/2015  . Antisocial traits 09/15/2015  . Tobacco use disorder 09/14/2015  . Cannabis use disorder, moderate, dependence (HCC) 09/14/2015    Orientation RESPIRATION BLADDER Height & Weight     Self, Time, Situation, Place  Normal Continent Weight: 160 lb (72.576 kg) Height:  6' (182.9 cm)  BEHAVIORAL SYMPTOMS/MOOD NEUROLOGICAL BOWEL NUTRITION STATUS   (none)  (none) Continent Diet  AMBULATORY STATUS COMMUNICATION OF NEEDS Skin   Independent Verbally Normal                       Personal Care Assistance Level of Assistance              Functional Limitations Info   (none)          SPECIAL CARE FACTORS FREQUENCY                       Contractures Contractures Info: Not present    Additional Factors Info                  DISCHARGE MEDICATIONS:   Current Discharge Medication List    START taking these medications   Details  lactulose (CHRONULAC) 10 GM/15ML solution Take 30 mLs (20 g total) by mouth daily. Qty: 600 mL, Refills: 0      CONTINUE these medications which have CHANGED    Details  divalproex (DEPAKOTE ER) 500 MG 24 hr tablet Take 3 tablets (1,500 mg total) by mouth at bedtime. Qty: 90 tablet, Refills: 0      CONTINUE these medications which have NOT CHANGED   Details  albuterol (PROVENTIL) (2.5 MG/3ML) 0.083% nebulizer solution Inhale 3 mLs into the lungs every 4 (four) hours as needed for wheezing or shortness of breath. Qty: 75 mL, Refills: 0    amantadine (SYMMETREL) 100 MG capsule Take 1 capsule (100 mg total) by mouth 2 (two) times daily. Qty: 60 capsule, Refills: 0    clonazePAM (KLONOPIN) 1 MG tablet Take 1 tablet (1 mg total) by mouth at bedtime. Qty: 30 tablet, Refills: 0    haloperidol (HALDOL) 10 MG tablet Take 1 tablet (10 mg total) by mouth 2 (two) times daily. Qty: 60 tablet, Refills: 0    haloperidol decanoate (HALDOL DECANOATE) 100 MG/ML injection Inject 2.5 mLs (250 mg total) into the muscle every 30 (thirty) days. Qty: 1 mL, Refills: 0             Discharge Medications: Please see discharge summary for a list of discharge medications.  Relevant Imaging Results:  Relevant Lab Results:   Additional Information    York Spaniel,  [name]

## 2016-02-18 NOTE — Progress Notes (Signed)
Patient became agitated, wanting to smoke and threatening to leave AMA; Dr. Elpidio Anis called and meds ordered; encouraged to stay with nicotine patch and hot food. Windy Carina, RN; 2:31 AM; 02/18/2016

## 2016-02-18 NOTE — Consult Note (Signed)
[facility] Consult   Reason for Consult:  Consult for this 40 year old man with schizophrenia was brought into the hospital because of elevated ammonia level Referring Physician:  Posey Pronto Patient Identification: Peter Wilson MRN:  [id number] Principal Diagnosis: Schizophrenia Diagnosis:   Patient Active Problem List   Diagnosis Date Noted  . Hyperammonemia ([place]) [E72.20] 02/17/2016  . Schizophrenia ([place]) [F20.9] 01/25/2016  . Tardive dyskinesia [G24.01] 09/15/2015  . Antisocial traits [F60.2] 09/15/2015  . Tobacco use disorder [F17.200] 09/14/2015  . Cannabis use disorder, moderate, dependence ([place]) [F12.20] 09/14/2015    Total Time spent with patient: 45 minutes  Subjective:   Peter Wilson is a 40 y.o. male patient admitted with "I am ready to go".  HPI:  Patient interviewed. Chart reviewed. Labs reviewed. Medications reviewed. Patient known to me from previous hospital stays as well. This 40 year old man with a history of schizophrenia was referred to the emergency room after routine testing of his ammonia level showed an elevated ammonia area when he came to the emergency room he had an elevated ammonia level about 65 and also an elevated valproic acid level of 122. I did not see him at that time and it's not clear whether his psychiatric symptoms were particularly different. Since being in the hospital he was given lactulose and his ammonia level has been rechecked and has come down to 35. He is being discharged today. Question for me was whether we should make any changes based on this episode area on interview the patient says that he had been feeling tired and had been sleeping a lot. He denied the third been any change to his mood. Denied depression and denied anger. Denied suicidal or homicidal ideation. He tells me he has not been having any hallucinations lately. Patient minimizes his anger problems although that's been a chronic issue. Since he's been in the  hospital here he has been at times a little hyperactive and intrusive and has even thrown some things when frustrated. He has not actually threatened physical violence.  Social history: Patient resides in a group home. He hasn't act team for support. He does have contact with his family but they [place] in another part of the [place] and he doesn't necessarily see them all that often.  Medical history: Patient has tardive dyskinesia diagnosis but otherwise does not have a lot of significant ongoing medical problems outside the psychiatric.  Substance abuse history: History of abuse of cannabis that has been a recurrent issue worsening his symptoms.  Past Psychiatric History: Patient has had multiple psychiatric hospitalizations including a recent hospitalization here at the beginning of 2017. During that stay his Depakote dose was increased to 2000 mg at night. He was continued on Haldol decanoate as his primary antipsychotic medicine as well as oral Haldol. It was noted that even during that hospital stay he was frequently intrusive and aggressive but not actually physically violent. No history of suicide attempts. Tends more to get aggressive with other people.  Risk to Self: Is patient at risk for suicide?: No Risk to Others:   Prior Inpatient Therapy:   Prior Outpatient Therapy:    Past Medical History:  Past Medical History  Diagnosis Date  . Schizophrenia, paranoid ([place])   . Tardive dyskinesia   . Tobacco abuse   . Marijuana abuse     Past Surgical History  Procedure Laterality Date  . No past surgeries     Family History: No family history on file. Family Psychiatric  History: Patient denies  being aware of any family history of mental illness Social History:  History  Alcohol Use No     History  Drug Use  . Yes  . Special: Marijuana    Social History   Social History  . Marital Status: Single    Spouse Name: N/A  . Number of Children: N/A  . Years of Education: N/A    Social History Main Topics  . Smoking status: Current Every Day Smoker -- 2.00 packs/day    Types: Cigarettes  . Smokeless tobacco: None  . Alcohol Use: No  . Drug Use: Yes    Special: Marijuana  . Sexual Activity: Not Asked   Other Topics Concern  . None   Social History Narrative   Additional Social History:    Allergies:   Allergies  Allergen Reactions  . Milk-Related Compounds Hives    Labs:  Results for orders placed or performed during the hospital encounter of 02/17/16 (from the past 48 hour(s))  CBC     Status: Abnormal   Collection Time: 02/17/16  6:36 PM  Result Value Ref Range   WBC 4.8 3.8 - 10.6 K/uL   RBC 4.50 4.40 - 5.90 MIL/uL   Hemoglobin 13.4 13.0 - 18.0 g/dL   HCT 40.9 40.0 - 52.0 %   MCV 90.9 80.0 - 100.0 fL   MCH 29.8 26.0 - 34.0 pg   MCHC 32.8 32.0 - 36.0 g/dL   RDW 14.8 (H) 11.5 - 14.5 %   Platelets 163 150 - 440 K/uL  Comprehensive metabolic panel     Status: Abnormal   Collection Time: 02/17/16  6:36 PM  Result Value Ref Range   Sodium 140 135 - 145 mmol/L   Potassium 4.1 3.5 - 5.1 mmol/L   Chloride 103 101 - 111 mmol/L   CO2 29 22 - 32 mmol/L   Glucose, Bld 114 (H) 65 - 99 mg/dL   BUN 9 6 - 20 mg/dL   Creatinine, Ser 1.03 0.61 - 1.24 mg/dL   Calcium 9.4 8.9 - 10.3 mg/dL   Total Protein 8.3 (H) 6.5 - 8.1 g/dL   Albumin 4.3 3.5 - 5.0 g/dL   AST 24 15 - 41 U/L   ALT 11 (L) 17 - 63 U/L   Alkaline Phosphatase 49 38 - 126 U/L   Total Bilirubin 0.8 0.3 - 1.2 mg/dL   GFR calc non Af Amer >60 >60 mL/min   GFR calc Af Amer >60 >60 mL/min    Comment: (NOTE) The eGFR has been calculated using the CKD EPI equation. This calculation has not been validated in all clinical situations. eGFR's persistently <60 mL/min signify possible Chronic Kidney Disease.    Anion gap 8 5 - 15  Valproic acid level     Status: Abnormal   Collection Time: 02/17/16  6:36 PM  Result Value Ref Range   Valproic Acid Lvl 122 (H) 50.0 - 100.0 ug/mL  Ammonia      Status: Abnormal   Collection Time: 02/17/16  8:48 PM  Result Value Ref Range   Ammonia 65 (H) 9 - 35 umol/L  Ammonia     Status: None   Collection Time: 02/18/16  6:38 AM  Result Value Ref Range   Ammonia 33 9 - 35 umol/L  Valproic acid level     Status: None   Collection Time: 02/18/16  6:38 AM  Result Value Ref Range   Valproic Acid Lvl 74 50.0 - 100.0 ug/mL  Hemoglobin  Status: Abnormal   Collection Time: 02/18/16  6:38 AM  Result Value Ref Range   Hemoglobin 12.4 (L) 13.0 - 18.0 g/dL    Current Facility-Administered Medications  Medication Dose Route Frequency Provider Last Rate Last Dose  . 0.9 %  sodium chloride infusion  250 mL Intravenous PRN Hillary Bow, MD      . acetaminophen (TYLENOL) tablet 650 mg  650 mg Oral Q6H PRN Hillary Bow, MD       Or  . acetaminophen (TYLENOL) suppository 650 mg  650 mg Rectal Q6H PRN Srikar Sudini, MD      . albuterol (PROVENTIL) (2.5 MG/3ML) 0.083% nebulizer solution 3 mL  3 mL Inhalation Q4H PRN Srikar Sudini, MD      . amantadine (SYMMETREL) capsule 100 mg  100 mg Oral BID Hillary Bow, MD   100 mg at 02/18/16 0331  . clonazePAM (KLONOPIN) tablet 1 mg  1 mg Oral QHS Hillary Bow, MD   1 mg at 02/18/16 0410  . divalproex (DEPAKOTE ER) 24 hr tablet 1,500 mg  1,500 mg Oral QHS Gonzella Lex, MD      . haloperidol (HALDOL) tablet 10 mg  10 mg Oral BID Hillary Bow, MD   10 mg at 02/18/16 0332  . lactulose (CHRONULAC) 10 GM/15ML solution 20 g  20 g Oral Daily Gonzella Lex, MD      . LORazepam (ATIVAN) injection 1 mg  1 mg Intravenous Q6H PRN Hillary Bow, MD   1 mg at 02/18/16 0305  . nicotine (NICODERM CQ - dosed in mg/24 hours) patch 14 mg  14 mg Transdermal Daily Hillary Bow, MD   14 mg at 02/18/16 0329  . ondansetron (ZOFRAN) tablet 4 mg  4 mg Oral Q6H PRN Hillary Bow, MD       Or  . ondansetron (ZOFRAN) injection 4 mg  4 mg Intravenous Q6H PRN Srikar Sudini, MD      . polyethylene glycol (MIRALAX / GLYCOLAX) packet 17 g   17 g Oral Daily PRN Srikar Sudini, MD      . sodium chloride flush (NS) 0.9 % injection 3 mL  3 mL Intravenous Q12H Srikar Sudini, MD   3 mL at 02/18/16 1000  . sodium chloride flush (NS) 0.9 % injection 3 mL  3 mL Intravenous PRN Hillary Bow, MD        Musculoskeletal: Strength & Muscle Tone: within normal limits Gait & Station: normal Patient leans: N/A  Psychiatric Specialty Exam: Review of Systems  Constitutional: Negative.   HENT: Negative.   Eyes: Negative.   Respiratory: Negative.   Cardiovascular: Negative.   Gastrointestinal: Negative.   Musculoskeletal: Negative.   Skin: Negative.   Neurological: Negative.   Psychiatric/Behavioral: Negative for depression, suicidal ideas, hallucinations, memory loss and substance abuse. The patient is not nervous/anxious and does not have insomnia.     Blood pressure 124/81, pulse 61, temperature 97.9 F (36.6 C), temperature source Oral, resp. rate 16, height 6' (1.829 m), weight 72.576 kg (160 lb), SpO2 100 %.Body mass index is 21.7 kg/(m^2).  General Appearance: Casual  Eye Contact::  Fair  Speech:  Normal Rate  Volume:  Normal  Mood:  Euthymic  Affect:  Labile and Still labile and a little intrusive but much more easily redirected. Not all that bad.  Thought Process:  Goal Directed  Orientation:  Full (Time, Place, and Person)  Thought Content:  Negative  Suicidal Thoughts:  No  Homicidal Thoughts:  No  Memory:  Immediate;   Fair Recent;   Fair Remote;   Fair  Judgement:  Impaired  Insight:  Shallow  Psychomotor Activity:  Normal  Concentration:  Fair  Recall:  [employer] of Knowledge:Fair  Language: Fair  Akathisia:  No  Handed:  Right  AIMS (if indicated):     Assets:  Catering manager Housing Physical Health Resilience Social Support  ADL's:  Intact  Cognition: Impaired,  Mild  Sleep:      Treatment Plan Summary: Medication management and Plan 40 year old man with a history of schizophrenia or  schizoaffective disorder who is frequently agitated at times can be quite threatening and even physically aggressive. Depakote has been a long-standing medicine. Looking back on [facility] chart it looks to me like he's probably been on it for years. I don't see a record of any ammonia level having been checked at least in our records until the ones done on this admission. Patient was brought into the hospital with an elevated ammonia level. Part of the question as to how to manage this has to do with whether or not it is considered to be symptomatic. Elevated ammonia levels are actually relatively common in people taking valproic acid but most of them are asymptomatic. The patient is telling me that he was more tired recently. That could be related to elevated ammonia but it also could be from his elevated Depakote or depending on how much we trust the patient's memory could be insignificant. Currently he certainly is not encephalopathic or sedated at all. My recommendation especially given the importance of trying to help control his mood lability, would be to continue his valproic acid but to cut the dose back to 1500 mg at night rather than 2000. I am also going to add a prescription for standing lactulose 20 g per day of liquid. This can help to keep ammonia levels down. I also mentioned to the patient that the addition of over-the-counter carnitine has some both theoretical and anecdotal support to help him lower ammonia levels. The doses I have seen quoted range from 2-4 g a day. I am unable to write a prescription for this as it is not in our formulary and it is an over-the-counter medicine. I will try to pass this information on to his act team if possible. No other change treatment at this point. I would recommend rechecking his Depakote level and ammonia level in the next month. If he does appear to become encephalopathic it would probably be reasonable at that point to try replacing Depakote with something  else. Patient was advised to the plan. He is agreeable. As far as I'm concerned he can be discharged from the hospital no need for inpatient psychiatric treatment.  Disposition: Patient does not meet criteria for psychiatric inpatient admission. Supportive therapy provided about ongoing stressors.  Alethia Berthold, MD 02/18/2016 12:58 PM

## 2016-02-18 NOTE — Discharge Summary (Signed)
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 Schizophrenia, paranoid ([facility])   . Tardive dyskinesia   . Tobacco abuse   . Marijuana abuse     HOSPITAL COURSE:   * Hyperammonemia This is likely from using valproate. Patient has received 1 dose of lactulose in the emergency room. Repeat ammonia improved His liver enzymes are normal.  -pt was seen by Dr clapacs and dose decreased to 1500 mg qhs -OTC carnitine 2-3 g daily recommended. Pt's ACT team will look into it Lactulose 20 g daily  * Bright red blood per rectum This has been going on for last 2 weeks. Hemoglobin is stable. GI follow-up as outpatient  * Schizophrenia Continue home medications  * DVT prophylaxis with SCDs  Overall stable D/w to group home CONSULTS OBTAINED:  Treatment Team:  Audery Amel, MD  DRUG ALLERGIES:   Allergies  Allergen Reactions  . Milk-Related Compounds Hives    DISCHARGE MEDICATIONS:   Current Discharge Medication List    START taking these medications   Details  lactulose (CHRONULAC) 10 GM/15ML solution Take 30 mLs (20 g total) by mouth daily. Qty: 600 mL, Refills: 0      CONTINUE these medications which have CHANGED   Details  divalproex (DEPAKOTE ER) 500 MG 24 hr tablet Take 3 tablets (1,500 mg total) by mouth at bedtime. Qty: 90 tablet, Refills: 0      CONTINUE these medications which have NOT CHANGED   Details  albuterol (PROVENTIL) (2.5 MG/3ML) 0.083% nebulizer solution Inhale 3 mLs  into the lungs every 4 (four) hours as needed for wheezing or shortness of breath. Qty: 75 mL, Refills: 0    amantadine (SYMMETREL) 100 MG capsule Take 1 capsule (100 mg total) by mouth 2 (two) times daily. Qty: 60 capsule, Refills: 0    clonazePAM (KLONOPIN) 1 MG tablet Take 1 tablet (1 mg total) by mouth at bedtime. Qty: 30 tablet, Refills: 0    haloperidol (HALDOL) 10 MG tablet Take 1 tablet (10 mg total) by mouth 2 (two) times daily. Qty: 60 tablet, Refills: 0    haloperidol decanoate (HALDOL DECANOATE) 100 MG/ML injection Inject 2.5 mLs (250 mg total) into the muscle every 30 (thirty) days. Qty: 1 mL, Refills: 0        If you experience worsening of your admission symptoms, develop shortness of breath, life threatening emergency, suicidal or homicidal thoughts you must seek medical attention immediately by calling 911 or calling your MD immediately  if symptoms less severe.  You Must read complete instructions/literature along with all the possible adverse reactions/side effects for all the Medicines you take and that have been prescribed to you. Take any new Medicines after you have completely understood and accept all the possible adverse reactions/side effects.   Please note  You were cared for by a hospitalist during your hospital stay. If you have any questions about your discharge medications or the care you received while you were in the hospital after you are discharged, you can call the unit and asked  to speak with the hospitalist on call if the hospitalist that took care of you is not available. Once you are discharged, your primary care physician will handle any further medical issues. Please note that NO REFILLS for any discharge medications will be authorized once you are discharged, as it is imperative that you return to your primary care physician (or establish a relationship with a primary care physician if you do not have one) for your aftercare needs so that they can  reassess your need for medications and monitor your lab values. Today   SUBJECTIVE   weak  VITAL SIGNS:  Blood pressure 124/81, pulse 61, temperature 97.9 F (36.6 C), temperature source Oral, resp. rate 16, height 6' (1.829 m), weight 72.576 kg (160 lb), SpO2 100 %.  I/O:   Intake/Output Summary (Last 24 hours) at 02/18/16 1332 Last data filed at 02/18/16 0800  Gross per 24 hour  Intake   1003 ml  Output      0 ml  Net   1003 ml    PHYSICAL EXAMINATION:  GENERAL:  40 y.o.-year-old patient lying in the bed with no acute distress.  EYES: Pupils equal, round, reactive to light and accommodation. No scleral icterus. Extraocular muscles intact.  HEENT: Head atraumatic, normocephalic. Oropharynx and nasopharynx clear.  NECK:  Supple, no jugular venous distention. No thyroid enlargement, no tenderness.  LUNGS: Normal breath sounds bilaterally, no wheezing, rales,rhonchi or crepitation. No use of accessory muscles of respiration.  CARDIOVASCULAR: S1, S2 normal. No murmurs, rubs, or gallops.  ABDOMEN: Soft, non-tender, non-distended. Bowel sounds present. No organomegaly or mass.  EXTREMITIES: No pedal edema, cyanosis, or clubbing.  NEUROLOGIC: Cranial nerves II through XII are intact. Muscle strength 5/5 in all extremities. Sensation intact. Gait not checked.  PSYCHIATRIC: The patient is alert and oriented x 3.  SKIN: No obvious rash, lesion, or ulcer.   DATA REVIEW:   CBC   Recent Labs Lab 02/17/16 1836 02/18/16 0638  WBC 4.8  --   HGB 13.4 12.4*  HCT 40.9  --   PLT 163  --     Chemistries   Recent Labs Lab 02/17/16 1836  NA 140  K 4.1  CL 103  CO2 29  GLUCOSE 114*  BUN 9  CREATININE 1.03  CALCIUM 9.4  AST 24  ALT 11*  ALKPHOS 49  BILITOT 0.8    Microbiology Results   No results found for this or any previous visit (from the past 240 hour(s)).  RADIOLOGY:  No results found.   Management plans discussed with the patient, family and they are in  agreement.  CODE STATUS:     Code Status Orders        Start     Ordered   02/17/16 2307  Full code   Continuous     02/17/16 2307    Code Status History    Date Active Date Inactive Code Status Order ID Comments User Context   01/25/2016  2:19 AM 02/01/2016  5:13 PM Full Code [id number]  Audery Amel, MD Inpatient   09/14/2015  8:07 PM 09/20/2015  8:06 PM Full Code [id number]  Jimmy Footman, MD Inpatient   09/14/2015  9:30 AM 09/14/2015  6:47 PM Full Code [id number]  Jimmy Footman, MD ED      TOTAL TIME TAKING CARE OF THIS PATIENT: 40 minutes.    Brynda Heick M.D on 02/18/2016 at 1:32 PM  Between 7am to 6pm - Pager - [phone] After 6pm go to [email or web address] -  password [email or web address] [facility]  [facility]  Office  [phone]  CC: Primary care physician; No PCP Per Patient

## 2016-06-06 ENCOUNTER — Emergency Department: Payer: Medicare Other

## 2016-06-06 ENCOUNTER — Emergency Department
Admission: EM | Admit: 2016-06-06 | Discharge: 2016-06-06 | Disposition: A | Discharge status: Home / Self Care | Payer: Medicare Other | Attending: Emergency Medicine | Admitting: Emergency Medicine

## 2016-06-06 ENCOUNTER — Encounter: Payer: Self-pay | Admitting: Emergency Medicine

## 2016-06-06 DIAGNOSIS — Y939 Activity, unspecified: Secondary | ICD-10-CM | POA: Diagnosis not present

## 2016-06-06 DIAGNOSIS — Z79899 Other long term (current) drug therapy: Secondary | ICD-10-CM | POA: Insufficient documentation

## 2016-06-06 DIAGNOSIS — F17210 Nicotine dependence, cigarettes, uncomplicated: Secondary | ICD-10-CM | POA: Diagnosis not present

## 2016-06-06 DIAGNOSIS — S161XXA Strain of muscle, fascia and tendon at neck level, initial encounter: Secondary | ICD-10-CM | POA: Insufficient documentation

## 2016-06-06 DIAGNOSIS — Y92410 Unspecified street and highway as the place of occurrence of the external cause: Secondary | ICD-10-CM | POA: Diagnosis not present

## 2016-06-06 DIAGNOSIS — Y999 Unspecified external cause status: Secondary | ICD-10-CM | POA: Diagnosis not present

## 2016-06-06 DIAGNOSIS — G44319 Acute post-traumatic headache, not intractable: Secondary | ICD-10-CM

## 2016-06-06 DIAGNOSIS — F200 Paranoid schizophrenia: Secondary | ICD-10-CM | POA: Diagnosis not present

## 2016-06-06 DIAGNOSIS — R51 Headache: Secondary | ICD-10-CM | POA: Diagnosis present

## 2016-06-06 MED ORDER — IBUPROFEN 600 MG PO TABS: 600.0000 mg | ORAL_TABLET | Freq: Three times a day (TID) | ORAL | Status: DC | PRN

## 2016-06-06 MED ORDER — IBUPROFEN 600 MG PO TABS: 600.0000 mg | ORAL_TABLET | Freq: Three times a day (TID) | ORAL | Status: AC | PRN

## 2016-06-06 NOTE — Discharge Instructions (Signed)
Begin taking ibuprofen 600 mg 3 times a day with food for your headache and body aches. Follow-up with your primary care doctor if any continued problems.
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
## 2016-06-26 ENCOUNTER — Emergency Department
Admission: EM | Admit: 2016-06-26 | Discharge: 2016-06-27 | Disposition: A | Discharge status: Other Acute Inpatient Hospital | Payer: Medicare Other | Attending: Emergency Medicine | Admitting: Emergency Medicine

## 2016-06-26 DIAGNOSIS — F1290 Cannabis use, unspecified, uncomplicated: Secondary | ICD-10-CM | POA: Insufficient documentation

## 2016-06-26 DIAGNOSIS — F29 Unspecified psychosis not due to a substance or known physiological condition: Secondary | ICD-10-CM | POA: Insufficient documentation

## 2016-06-26 DIAGNOSIS — F250 Schizoaffective disorder, bipolar type: Secondary | ICD-10-CM | POA: Insufficient documentation

## 2016-06-26 DIAGNOSIS — F17200 Nicotine dependence, unspecified, uncomplicated: Secondary | ICD-10-CM | POA: Diagnosis present

## 2016-06-26 DIAGNOSIS — Z79899 Other long term (current) drug therapy: Secondary | ICD-10-CM | POA: Insufficient documentation

## 2016-06-26 DIAGNOSIS — R4689 Other symptoms and signs involving appearance and behavior: Secondary | ICD-10-CM | POA: Diagnosis present

## 2016-06-26 DIAGNOSIS — F1220 Cannabis dependence, uncomplicated: Secondary | ICD-10-CM | POA: Diagnosis present

## 2016-06-26 DIAGNOSIS — F17210 Nicotine dependence, cigarettes, uncomplicated: Secondary | ICD-10-CM | POA: Insufficient documentation

## 2016-06-26 DIAGNOSIS — F602 Antisocial personality disorder: Secondary | ICD-10-CM | POA: Diagnosis present

## 2016-06-26 LAB — COMPREHENSIVE METABOLIC PANEL
ALT: 52 U/L (ref 17–63)
AST: 57 U/L — AB (ref 15–41)
Albumin: 4.2 g/dL (ref 3.5–5.0)
Alkaline Phosphatase: 60 U/L (ref 38–126)
Anion gap: 7 (ref 5–15)
BUN: 10 mg/dL (ref 6–20)
CHLORIDE: 103 mmol/L (ref 101–111)
CO2: 28 mmol/L (ref 22–32)
CREATININE: 1.06 mg/dL (ref 0.61–1.24)
Calcium: 9.5 mg/dL (ref 8.9–10.3)
GFR calc Af Amer: >60 mL/min (ref >60)
GLUCOSE: 103 mg/dL — AB (ref 65–99)
Potassium: 4.2 mmol/L (ref 3.5–5.1)
Sodium: 138 mmol/L (ref 135–145)
Total Bilirubin: 0.9 mg/dL (ref 0.3–1.2)
Total Protein: 8 g/dL (ref 6.5–8.1)

## 2016-06-26 LAB — CBC WITH DIFFERENTIAL/PLATELET
BASOS ABS: 0 10*3/uL (ref 0–0.1)
Basophils Relative: 1 %
EOS PCT: 2 %
Eosinophils Absolute: 0.1 10*3/uL (ref 0–0.7)
HEMATOCRIT: 39.1 % — AB (ref 40.0–52.0)
Hemoglobin: 12.9 g/dL — ABNORMAL LOW (ref 13.0–18.0)
LYMPHS PCT: 44 %
Lymphs Abs: 2.2 10*3/uL (ref 1.0–3.6)
MCH: 30.1 pg (ref 26.0–34.0)
MCHC: 32.9 g/dL (ref 32.0–36.0)
MCV: 91.6 fL (ref 80.0–100.0)
Monocytes Absolute: 0.5 10*3/uL (ref 0.2–1.0)
Monocytes Relative: 10 %
NEUTROS ABS: 2.1 10*3/uL (ref 1.4–6.5)
Neutrophils Relative %: 43 %
PLATELETS: 247 10*3/uL (ref 150–440)
RBC: 4.27 MIL/uL — AB (ref 4.40–5.90)
RDW: 14.8 % — ABNORMAL HIGH (ref 11.5–14.5)
WBC: 4.9 10*3/uL (ref 3.8–10.6)

## 2016-06-26 LAB — SALICYLATE LEVEL: Salicylate Lvl: <4 mg/dL (ref 2.8–30.0)

## 2016-06-26 LAB — ACETAMINOPHEN LEVEL: Acetaminophen (Tylenol), Serum: <10 ug/mL — ABNORMAL LOW (ref 10–30)

## 2016-06-26 LAB — ETHANOL

## 2016-06-26 MED ORDER — HALOPERIDOL LACTATE 5 MG/ML IJ SOLN
INTRAMUSCULAR | Status: AC
  Administered 2016-06-26: 5 mg via INTRAMUSCULAR
  Filled 2016-06-26: qty 1

## 2016-06-26 MED ORDER — DIVALPROEX SODIUM ER 500 MG PO TB24
1500.0000 mg | ORAL_TABLET | Freq: Every day | ORAL | Status: DC
  Administered 2016-06-27: 1500 mg via ORAL
  Filled 2016-06-26: qty 3

## 2016-06-26 MED ORDER — ALBUTEROL SULFATE (2.5 MG/3ML) 0.083% IN NEBU
3.0000 mL | INHALATION_SOLUTION | RESPIRATORY_TRACT | Status: DC | PRN
  Filled 2016-06-26: qty 3

## 2016-06-26 MED ORDER — AMANTADINE HCL 100 MG PO CAPS
100.0000 mg | ORAL_CAPSULE | Freq: Two times a day (BID) | ORAL | Status: DC
  Administered 2016-06-27: 100 mg via ORAL
  Filled 2016-06-26 (×3): qty 1

## 2016-06-26 MED ORDER — CLONAZEPAM 1 MG PO TABS
1.0000 mg | ORAL_TABLET | Freq: Every day | ORAL | Status: DC
  Administered 2016-06-27: 1 mg via ORAL
  Filled 2016-06-26: qty 1

## 2016-06-26 MED ORDER — HALOPERIDOL LACTATE 5 MG/ML IJ SOLN
5.0000 mg | Freq: Once | INTRAMUSCULAR | Status: AC
  Administered 2016-06-26: 5 mg via INTRAMUSCULAR

## 2016-06-26 MED ORDER — LACTULOSE 10 GM/15ML PO SOLN
20.0000 g | Freq: Every day | ORAL | Status: DC
  Filled 2016-06-26: qty 30

## 2016-06-26 MED ORDER — HALOPERIDOL 5 MG PO TABS
10.0000 mg | ORAL_TABLET | Freq: Two times a day (BID) | ORAL | Status: DC
  Administered 2016-06-27 (×2): 10 mg via ORAL
  Filled 2016-06-26 (×2): qty 2

## 2016-06-26 MED ORDER — DIPHENHYDRAMINE HCL 50 MG/ML IJ SOLN
INTRAMUSCULAR | Status: AC
  Administered 2016-06-26: 50 mg via INTRAVENOUS
  Filled 2016-06-26: qty 1

## 2016-06-26 MED ORDER — DIPHENHYDRAMINE HCL 50 MG/ML IJ SOLN
50.0000 mg | Freq: Once | INTRAMUSCULAR | Status: AC
  Administered 2016-06-26: 50 mg via INTRAVENOUS

## 2016-06-26 MED ORDER — LORAZEPAM 2 MG/ML IJ SOLN
2.0000 mg | Freq: Once | INTRAMUSCULAR | Status: AC
  Administered 2016-06-26: 2 mg via INTRAMUSCULAR

## 2016-06-26 MED ORDER — LORAZEPAM 2 MG/ML IJ SOLN
INTRAMUSCULAR | Status: AC
  Administered 2016-06-26: 2 mg via INTRAMUSCULAR
  Filled 2016-06-26: qty 1
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
## 2016-06-26 NOTE — ED Notes (Signed)
Pt. Verbalizes he is still upset with the hospital staff and "don't want to answer your questions...ibuprofen ain't got nothing to say to you." Assessment completed to best of this RN ability due to pt. Lack of cooperation.

## 2016-06-26 NOTE — ED Notes (Signed)
Pt. Was cooperative with this RN to draw blood. Pt. Calmly stated he was upset because I have stuck him with needles, but said so calmly and in respectful language. Pt. Also thanked this RN for a warm blanket.
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
## 2016-06-26 NOTE — Progress Notes (Signed)
Pt states that he no longer wants the services of the [facility] team as of today. He has requested that staff not share information with the organization.    06/26/2016 Cheryl FlashNicole Dorthula Bier, MS, NCC, LPCA Therapeutic Triage Specialist

## 2016-06-27 ENCOUNTER — Inpatient Hospital Stay
Admission: RE | Admit: 2016-06-27 | Discharge: 2016-07-05 | DRG: 885 | Disposition: A | Discharge status: Home / Self Care | Payer: Medicare Other | Source: Intra-hospital | Attending: Psychiatry | Admitting: Psychiatry

## 2016-06-27 ENCOUNTER — Encounter: Payer: Self-pay | Admitting: Psychiatry

## 2016-06-27 DIAGNOSIS — G4700 Insomnia, unspecified: Secondary | ICD-10-CM | POA: Diagnosis present

## 2016-06-27 DIAGNOSIS — G2401 Drug induced subacute dyskinesia: Secondary | ICD-10-CM | POA: Diagnosis present

## 2016-06-27 DIAGNOSIS — E7220 Disorder of urea cycle metabolism, unspecified: Secondary | ICD-10-CM | POA: Diagnosis present

## 2016-06-27 DIAGNOSIS — F250 Schizoaffective disorder, bipolar type: Secondary | ICD-10-CM

## 2016-06-27 DIAGNOSIS — Z9114 Patient's other noncompliance with medication regimen: Secondary | ICD-10-CM | POA: Diagnosis not present

## 2016-06-27 DIAGNOSIS — Z79899 Other long term (current) drug therapy: Secondary | ICD-10-CM

## 2016-06-27 DIAGNOSIS — F17210 Nicotine dependence, cigarettes, uncomplicated: Secondary | ICD-10-CM | POA: Diagnosis present

## 2016-06-27 DIAGNOSIS — Z818 Family history of other mental and behavioral disorders: Secondary | ICD-10-CM | POA: Diagnosis not present

## 2016-06-27 DIAGNOSIS — F17200 Nicotine dependence, unspecified, uncomplicated: Secondary | ICD-10-CM | POA: Diagnosis present

## 2016-06-27 DIAGNOSIS — F602 Antisocial personality disorder: Secondary | ICD-10-CM | POA: Diagnosis present

## 2016-06-27 DIAGNOSIS — F1220 Cannabis dependence, uncomplicated: Secondary | ICD-10-CM | POA: Diagnosis present

## 2016-06-27 DIAGNOSIS — F29 Unspecified psychosis not due to a substance or known physiological condition: Secondary | ICD-10-CM | POA: Diagnosis not present

## 2016-06-27 LAB — VALPROIC ACID LEVEL: Valproic Acid Lvl: 43 ug/mL — ABNORMAL LOW (ref 50.0–100.0)

## 2016-06-27 MED ORDER — ALBUTEROL SULFATE (2.5 MG/3ML) 0.083% IN NEBU
3.0000 mL | INHALATION_SOLUTION | RESPIRATORY_TRACT | Status: DC | PRN
  Filled 2016-06-27: qty 3

## 2016-06-27 MED ORDER — CLONAZEPAM 1 MG PO TABS
1.0000 mg | ORAL_TABLET | Freq: Every day | ORAL | Status: DC
  Administered 2016-06-27 – 2016-07-02 (×6): 1 mg via ORAL
  Filled 2016-06-27 (×6): qty 1

## 2016-06-27 MED ORDER — HALOPERIDOL 5 MG PO TABS
10.0000 mg | ORAL_TABLET | Freq: Two times a day (BID) | ORAL | Status: DC
  Administered 2016-06-27 – 2016-06-28 (×2): 10 mg via ORAL
  Filled 2016-06-27 (×2): qty 2

## 2016-06-27 MED ORDER — ACETAMINOPHEN 325 MG PO TABS
650.0000 mg | ORAL_TABLET | Freq: Four times a day (QID) | ORAL | Status: DC | PRN
  Administered 2016-06-27 – 2016-07-03 (×9): 650 mg via ORAL
  Filled 2016-06-27 (×9): qty 2

## 2016-06-27 MED ORDER — LORAZEPAM 2 MG PO TABS
2.0000 mg | ORAL_TABLET | ORAL | Status: DC | PRN
  Administered 2016-06-29 – 2016-07-05 (×7): 2 mg via ORAL
  Filled 2016-06-27 (×7): qty 1

## 2016-06-27 MED ORDER — NICOTINE 21 MG/24HR TD PT24
21.0000 mg | MEDICATED_PATCH | Freq: Every day | TRANSDERMAL | Status: DC
  Filled 2016-06-27: qty 1

## 2016-06-27 MED ORDER — LORAZEPAM 2 MG/ML IJ SOLN: 2.0000 mg | INTRAMUSCULAR | Status: DC | PRN

## 2016-06-27 MED ORDER — AMANTADINE HCL 100 MG PO CAPS
100.0000 mg | ORAL_CAPSULE | Freq: Two times a day (BID) | ORAL | Status: DC
  Administered 2016-06-27 – 2016-07-05 (×16): 100 mg via ORAL
  Filled 2016-06-27 (×16): qty 1

## 2016-06-27 MED ORDER — OLANZAPINE 10 MG IM SOLR
10.0000 mg | Freq: Two times a day (BID) | INTRAMUSCULAR | Status: DC | PRN
  Administered 2016-06-29: 10 mg via INTRAMUSCULAR
  Filled 2016-06-27: qty 10

## 2016-06-27 MED ORDER — DIVALPROEX SODIUM ER 500 MG PO TB24
1500.0000 mg | ORAL_TABLET | Freq: Every day | ORAL | Status: DC
  Administered 2016-06-27 – 2016-06-28 (×2): 1500 mg via ORAL
  Filled 2016-06-27 (×2): qty 3

## 2016-06-27 MED ORDER — MAGNESIUM HYDROXIDE 400 MG/5ML PO SUSP: 30.0000 mL | Freq: Every day | ORAL | Status: DC | PRN

## 2016-06-27 MED ORDER — LACTULOSE 10 GM/15ML PO SOLN
20.0000 g | Freq: Every day | ORAL | Status: DC
  Administered 2016-06-29: 20 g via ORAL
  Filled 2016-06-27 (×3): qty 30

## 2016-06-27 MED ORDER — ALUM & MAG HYDROXIDE-SIMETH 200-200-20 MG/5ML PO SUSP
30.0000 mL | ORAL | Status: DC | PRN
  Administered 2016-07-01: 30 mL via ORAL
  Filled 2016-06-27: qty 30

## 2016-06-27 NOTE — ED Provider Notes (Signed)
-----------------------------------------   7:18 AM on 06/27/2016 -----------------------------------------   Blood pressure 129/92, pulse 82, resp. rate 20, SpO2 98 %.  The patient had no acute events since last update.  Calm and cooperative at this time.  Disposition is pending per Psychiatry/Behavioral Medicine team recommendations.     Irean HongJade J Sung, MD 06/27/16 [phone]

## 2016-06-27 NOTE — H&P (Signed)
Psychiatric Admission Assessment Adult  Patient Identification: Peter Wilson MRN:  [id number] Date of Evaluation:  06/27/2016 Chief Complaint:  IVC Principal Diagnosis: Schizoaffective disorder, bipolar type [facility]) Diagnosis:   Patient Active Problem List   Diagnosis Date Noted  . Schizoaffective disorder, bipolar type ([place]) [F25.0] 06/27/2016  . Hyperammonemia ([place]) [E72.20] 02/17/2016  . Tardive dyskinesia [G24.01] 09/15/2015  . Antisocial traits [F60.2] 09/15/2015  . Tobacco use disorder [F17.200] 09/14/2015  . Cannabis use disorder, moderate, dependence ([place]) [F12.20] 09/14/2015   History of Present Illness:   Patient is a 40 year old African-American male with history of schizoaffective disorder, antisocial traits and tardive dyskinesia. Patient is under the care of [facility] ACT.  Patient brought in on 7/3 via BPD with IVC papers completed by ACT team. Papers state patient was threatening neighbors,burning self with cigarettes, non compliant with medications. Per ER notes pt was combative at arrival. He was uncooperative with ER physician and told him  "I'm going to kill that bitch."  Patient is currently prescribed with Haldol Decanoate 250 mg IM every monthly, Haldol oral 20 mg by mouth twice a day, Depakote 1500 mg qhs, amantadine 100 mg by mouth twice a day and lactulose for hyperammonemia.  Patient denies all the allegations made by the acting. He states that his neighbor at the apartment complex has been calling him and his mother the "n" word. Patient denies being aggressive towards anybody but at the same time tells me "I was just defending myself".  Patient denies depressed mood, problems with his sleep, appetite, energy or concentration. He denies suicidality, homicidality or having auditory or visual hallucinations. Stated that he has been compliant with medications.  Patient has history of tardive dyskinesia and is evident the patient has tremors and moderate  intensity.  Substance abuse denies recent use of any substances. He has past history of cannabis use and he smokes about 2 packs of cigarettes per day   Associated Signs/Symptoms: Depression Symptoms:  denies (Hypo) Manic Symptoms:  Delusions, Elevated Mood, Impulsivity, Irritable Mood, Anxiety Symptoms:  denies Psychotic Symptoms:  Paranoia, PTSD Symptoms: NA   Total Time spent with patient: 1 hour  Past Psychiatric History:  Patient carries a diagnosis of schizoaffective d/o. He has a multitude of hospitalizations there is started in his late teens. Patient stated he has been hospitalized at all [place], [place], [place], [place], [place] and in [facility]. His last hospitalization was here in Sep of 2016. Currently receives acting services through [employer] number [phone].  Is the patient at risk to self? No.  Has the patient been a risk to self in the past 6 months? No.  Has the patient been a risk to self within the distant past? No.  Is the patient a risk to others? Yes.    Has the patient been a risk to others in the past 6 months? No.  Has the patient been a risk to others within the distant past? Yes.       Past Medical History:  Past Medical History  Diagnosis Date  . Schizophrenia, paranoid ([place])   . Tardive dyskinesia   . Tobacco abuse   . Marijuana abuse    Past Surgical History  Procedure Laterality Date  . No past surgeries     Family History: History reviewed. No pertinent family history.  Family Psychiatric  History: Reports that his father was diagnosed with schizophrenia  Social History: Patient is originally from [facility]; he is single and has  2 children ages 7 and 53-year-old. The 34 year old child lives with the child's mother and his 72-year-old child lives with the patient's mother. Patient had been living at a local group home for years but now he is living in an apartment.. As far as his  education patient states that he was "kicked out" from 12th grade.    Tobacco Screening: 2 packs per day  Social History:  History  Alcohol Use No    History  Drug Use  . Yes  . Special: Marijuana    Additional Social History: Marital status: Single    Pain Medications: See PTA meds Prescriptions: See PTA meds Over the Counter: See PTA meds History of alcohol / drug use?: Yes Longest period of sobriety (when/how long): UTA  Negative Consequences of Use:  (UTA) Withdrawal Symptoms:  (UTA) Name of Substance 1: THC  1 - Age of First Use: UTA  1 - Amount (size/oz): UTA 1 - Frequency: Daily  1 - Duration: Years  1 - Last Use / Amount: UTA                  Allergies:   Allergies  Allergen Reactions  . Milk-Related Compounds Hives   Lab Results:  Results for orders placed or performed during the hospital encounter of 06/26/16 (from the past 48 hour(s))  CBC with Differential     Status: Abnormal   Collection Time: 06/26/16  4:47 PM  Result Value Ref Range   WBC 4.9 3.8 - 10.6 K/uL   RBC 4.27 (L) 4.40 - 5.90 MIL/uL   Hemoglobin 12.9 (L) 13.0 - 18.0 g/dL   HCT 39.1 (L) 40.0 - 52.0 %   MCV 91.6 80.0 - 100.0 fL   MCH 30.1 26.0 - 34.0 pg   MCHC 32.9 32.0 - 36.0 g/dL   RDW 14.8 (H) 11.5 - 14.5 %   Platelets 247 150 - 440 K/uL   Neutrophils Relative % 43 %   Neutro Abs 2.1 1.4 - 6.5 K/uL   Lymphocytes Relative 44 %   Lymphs Abs 2.2 1.0 - 3.6 K/uL   Monocytes Relative 10 %   Monocytes Absolute 0.5 0.2 - 1.0 K/uL   Eosinophils Relative 2 %   Eosinophils Absolute 0.1 0 - 0.7 K/uL   Basophils Relative 1 %   Basophils Absolute 0.0 0 - 0.1 K/uL  Comprehensive metabolic panel     Status: Abnormal   Collection Time: 06/26/16  4:47 PM  Result Value Ref Range   Sodium 138 135 - 145 mmol/L   Potassium 4.2 3.5 - 5.1 mmol/L   Chloride 103 101 - 111 mmol/L   CO2 28 22 - 32 mmol/L   Glucose, Bld 103 (H) 65 - 99 mg/dL   BUN 10 6 - 20 mg/dL   Creatinine, Ser 1.06 0.61 -  1.24 mg/dL   Calcium 9.5 8.9 - 10.3 mg/dL   Total Protein 8.0 6.5 - 8.1 g/dL   Albumin 4.2 3.5 - 5.0 g/dL   AST 57 (H) 15 - 41 U/L   ALT 52 17 - 63 U/L   Alkaline Phosphatase 60 38 - 126 U/L   Total Bilirubin 0.9 0.3 - 1.2 mg/dL   GFR calc non Af Amer >60 >60 mL/min   GFR calc Af Amer >60 >60 mL/min    Comment: (NOTE) The eGFR has been calculated using the CKD EPI equation. This calculation has not been validated in all clinical situations. eGFR's persistently <60 mL/min signify possible Chronic Kidney Disease.  Anion gap 7 5 - 15  Ethanol     Status: None   Collection Time: 06/26/16  4:47 PM  Result Value Ref Range   Alcohol, Ethyl (B) <5 <5 mg/dL    Comment:        LOWEST DETECTABLE LIMIT FOR SERUM ALCOHOL IS 5 mg/dL FOR MEDICAL PURPOSES ONLY   Acetaminophen level     Status: Abnormal   Collection Time: 06/26/16  4:47 PM  Result Value Ref Range   Acetaminophen (Tylenol), Serum <10 (L) 10 - 30 ug/mL    Comment:        THERAPEUTIC CONCENTRATIONS VARY SIGNIFICANTLY. A RANGE OF 10-30 ug/mL MAY BE AN EFFECTIVE CONCENTRATION FOR MANY PATIENTS. HOWEVER, SOME ARE BEST TREATED AT CONCENTRATIONS OUTSIDE THIS RANGE. ACETAMINOPHEN CONCENTRATIONS >150 ug/mL AT 4 HOURS AFTER INGESTION AND >50 ug/mL AT 12 HOURS AFTER INGESTION ARE OFTEN ASSOCIATED WITH TOXIC REACTIONS.   Salicylate level     Status: None   Collection Time: 06/26/16  4:47 PM  Result Value Ref Range   Salicylate Lvl <1.9 2.8 - 30.0 mg/dL    Blood Alcohol level:  Lab Results  Component Value Date   [facility] <5 06/26/2016   ETH <5 37/90/2409    Metabolic Disorder Labs:  Lab Results  Component Value Date   HGBA1C 5.0 01/27/2016   Lab Results  Component Value Date   PROLACTIN 40.2* 01/27/2016   Lab Results  Component Value Date   CHOL 150 01/27/2016   TRIG 83 01/27/2016   HDL 44 01/27/2016   CHOLHDL 3.4 01/27/2016   VLDL 17 01/27/2016   LDLCALC 89 01/27/2016   LDLCALC 117* 09/15/2015    Current  Medications: Current Facility-Administered Medications  Medication Dose Route Frequency Provider Last Rate Last Dose  . albuterol (PROVENTIL) (2.5 MG/3ML) 0.083% nebulizer solution 3 mL  3 mL Inhalation Q4H PRN Orbie Pyo, MD      . amantadine (SYMMETREL) capsule 100 mg  100 mg Oral BID Orbie Pyo, MD   100 mg at 06/27/16 1044  . clonazePAM (KLONOPIN) tablet 1 mg  1 mg Oral QHS Orbie Pyo, MD   1 mg at 06/27/16 0004  . divalproex (DEPAKOTE ER) 24 hr tablet 1,500 mg  1,500 mg Oral QHS Orbie Pyo, MD   1,500 mg at 06/27/16 0004  . haloperidol (HALDOL) tablet 10 mg  10 mg Oral BID Orbie Pyo, MD   10 mg at 06/27/16 1044  . lactulose (CHRONULAC) 10 GM/15ML solution 20 g  20 g Oral Daily Orbie Pyo, MD   20 g at 06/27/16 1044   Current Outpatient Prescriptions  Medication Sig Dispense Refill  . albuterol (PROVENTIL) (2.5 MG/3ML) 0.083% nebulizer solution Inhale 3 mLs into the lungs every 4 (four) hours as needed for wheezing or shortness of breath. 75 mL 0  . amantadine (SYMMETREL) 100 MG capsule Take 1 capsule (100 mg total) by mouth 2 (two) times daily. 60 capsule 0  . clonazePAM (KLONOPIN) 1 MG tablet Take 1 tablet (1 mg total) by mouth at bedtime. 30 tablet 0  . divalproex (DEPAKOTE ER) 500 MG 24 hr tablet Take 3 tablets (1,500 mg total) by mouth at bedtime. 90 tablet 0  . haloperidol (HALDOL) 10 MG tablet Take 1 tablet (10 mg total) by mouth 2 (two) times daily. (Patient taking differently: Take 10 mg by mouth 2 (two) times daily. Did not take today due to blood work 02/17/2016) 60 tablet 0  . haloperidol decanoate (HALDOL  DECANOATE) 100 MG/ML injection Inject 2.5 mLs (250 mg total) into the muscle every 30 (thirty) days. 1 mL 0  . lactulose (CHRONULAC) 10 GM/15ML solution Take 30 mLs (20 g total) by mouth daily. 600 mL 0   PTA Medications:  (Not in a hospital admission)  Musculoskeletal: Strength & Muscle Tone:  within normal limits Gait & Station: normal Patient leans: N/A  Psychiatric Specialty Exam: Physical Exam  Constitutional: He is oriented to person, place, and time. He appears well-developed and well-nourished.  HENT:  Head: Normocephalic and atraumatic.  Eyes: EOM are normal.  Neck: Normal range of motion.  Respiratory: Effort normal.  Musculoskeletal: Normal range of motion.  Neurological: He is alert and oriented to person, place, and time.    Review of Systems  Neurological: Positive for tremors.  Psychiatric/Behavioral: Negative for depression, suicidal ideas, hallucinations, memory loss and substance abuse. The patient is not nervous/anxious and does not have insomnia.   All other systems reviewed and are negative.   Blood pressure 129/92, pulse 82, resp. rate 20, SpO2 98 %.There is no weight on file to calculate BMI.  General Appearance: Fairly Groomed  Eye Contact:  Good  Speech:  Blocked  Volume:  Normal  Mood:  Irritable  Affect:  Congruent  Thought Process:  Linear and Descriptions of Associations: Intact  Orientation:  Full (Time, Place, and Person)  Thought Content:  Paranoid Ideation  Suicidal Thoughts:  No  Homicidal Thoughts:  No  Memory:  Immediate;   Fair Recent;   Fair Remote;   Fair  Judgement:  Poor  Insight:  Shallow  Psychomotor Activity:  Normal  Concentration:  Concentration: Poor and Attention Span: Poor  Recall:  [employer] of Knowledge:  Fair  Language:  Fair  Akathisia:  No  Handed:    AIMS (if indicated):     Assets:  Agricultural consultant Housing Social Support  ADL's:  Intact  Cognition:  WNL  Sleep:        Treatment Plan Summary:  Patient will be admitted to our unit for stabilization.  Schizoaffective disorder bipolar type the patient will be continued on Haldol 10 mg by mouth twice a day. I will contact [facility] acting to see patient is is still taking Haldol Decanoate 250 mg every month.  Continue Depakote 1500 mg daily at bedtime.  EPS and tardive dyskinesia continue amantadine 100 mg by mouth twice a day  Insomnia continue clonazepam 1 mg by mouth daily at bedtime  History of hyperammonemia secondary to treatment with Depakote continue lactulose daily  Tobacco use disorder patient will started on a nicotine patch 21 mg a day.  Precautions every 15 minute checks  Diet regular  Vital signs daily  Hospitalization and status continue involuntary commitment  Labs I will order hemoglobin A1c, lipid panel and prolactin level. I will also check a valproic acid acid, TSH and ammonia  Collateral information will be obtained from [facility] patient will return to his apartment once stable     I certify that inpatient services furnished can reasonably be expected to improve the patient's condition.    Hildred Priest, MD 7/4/20171:33 PM

## 2016-06-27 NOTE — Progress Notes (Signed)
Patient pleasant and cooperative during admission assessment. Patient denies SI/HI at this time. Patient denies AVH. Patient informed of fall risk status, fall risk assessed "low" at this time. Patient oriented to unit/staff/room. Patient denies any questions/concerns at this time. Patient safe on unit with Q15 minute checks for safety. Skin assessment & body search done.No contraband found. 

## 2016-06-27 NOTE — Tx Team (Signed)
Initial Interdisciplinary Treatment Plan   PATIENT STRESSORS: Medication change or noncompliance Substance abuse   PATIENT STRENGTHS: Average or above average intelligence Capable of independent living Communication skills   PROBLEM LIST: Problem List/Patient Goals Date to be addressed Date deferred Reason deferred Estimated date of resolution  Schizophrenia 06/27/2016     Substance abuse 06/27/2016                                                DISCHARGE CRITERIA:  Ability to meet basic life and health needs Adequate post-discharge living arrangements  PRELIMINARY DISCHARGE PLAN: Attend aftercare/continuing care group Return to previous living arrangement  PATIENT/FAMIILY INVOLVEMENT: This treatment plan has been presented to and reviewed with the patient, Williemae AreaQuincy Dragon, and/or family member,   The patient and family have been given the opportunity to ask questions and make suggestions.  Margo CommonGigi George Emalene Welte 06/27/2016, 6:35 PM

## 2016-06-27 NOTE — [facility] Suicide Risk Assessment (Signed)
[facility]   Nursing information obtained from:    Demographic factors:    Current Mental Status:    Loss Factors:    Historical Factors:    Risk Reduction Factors:     Total Time spent with patient: 1 hour Principal Problem: Schizoaffective disorder, bipolar type ([facility]) Diagnosis:   Patient Active Problem List   Diagnosis Date Noted  . Schizoaffective disorder, bipolar type ([facility]) [F25.0] 06/27/2016  . Hyperammonemia ([facility]) [E72.20] 02/17/2016  . Tardive dyskinesia [G24.01] 09/15/2015  . Antisocial traits [F60.2] 09/15/2015  . Tobacco use disorder [F17.200] 09/14/2015  . Cannabis use disorder, moderate, dependence ([facility]) [F12.20] 09/14/2015   Subjective Data:   Continued Clinical Symptoms:    The "Alcohol Use Disorders Identification Test", Guidelines for Use in Primary Care, Second Edition.  World Science writerHealth Organization [facility](WHO). Score between 0-7:  no or low risk or alcohol related problems. Score between 8-15:  moderate risk of alcohol related problems. Score between 16-19:  high risk of alcohol related problems. Score 20 or above:  warrants further diagnostic evaluation for alcohol dependence and treatment.   CLINICAL FACTORS:   Severe Anxiety and/or Agitation Schizophrenia:   Paranoid or undifferentiated type Currently Psychotic Previous Psychiatric Diagnoses and Treatments   Psychiatric Specialty Exam: Physical Exam  ROS  Blood pressure 129/92, pulse 82, resp. rate 20, SpO2 98 %.There is no weight on file to calculate BMI.     COGNITIVE FEATURES THAT CONTRIBUTE TO RISK:  None    SUICIDE RISK:   Moderate:  Frequent suicidal ideation with limited intensity, and duration, some specificity in terms of plans, no associated intent, good self-control, limited dysphoria/symptomatology, some risk factors present, and identifiable protective factors, including available and accessible social support.  [facility]  I certify that  inpatient services furnished can reasonably be expected to improve the patient's condition.   Jimmy FootmanHernandez-Gonzalez,  Tranae Laramie, MD 06/27/2016, 1:33 PM

## 2016-06-27 NOTE — ED Notes (Signed)
[facility] Valproic [facility] level. Called lab to verify tube color. Blood draw tolerated well by patient.

## 2016-06-27 NOTE — Consult Note (Signed)
[place] Psychiatry Consult   Reason for Consult:  aggression Referring Physician:  ER Patient Identification: Peter Wilson MRN:  [id number] Principal Diagnosis: Schizoaffective disorder, bipolar type [facility]) Diagnosis:   Patient Active Problem List   Diagnosis Date Noted  . Schizoaffective disorder, bipolar type ([place]) [F25.0] 06/27/2016  . Hyperammonemia ([place]) [E72.20] 02/17/2016  . Tardive dyskinesia [G24.01] 09/15/2015  . Antisocial traits [F60.2] 09/15/2015  . Tobacco use disorder [F17.200] 09/14/2015  . Cannabis use disorder, moderate, dependence ([place]) [F12.20] 09/14/2015    Total Time spent with patient: 1 hour  Subjective:   Peter Wilson is a 40 y.o. male patient admitted with aggression  HPI:    Patient is a 40 year old African-American male with history of schizoaffective disorder, antisocial traits and tardive dyskinesia. Patient is under the care of [facility] ACT.  Patient brought in on 7/3 via BPD with IVC papers completed by ACT team. Papers state patient was threatening neighbors,burning self with cigarettes, non compliant with medications. Per ER notes pt was combative at arrival. He was uncooperative with ER physician and told him  "I'm going to kill that bitch."  Patient is currently prescribed with Haldol Decanoate 250 mg IM every monthly, Haldol oral 20 mg by mouth twice a day, Depakote 1500 mg qhs, amantadine 100 mg by mouth twice a day and lactulose for hyperammonemia.  Patient denies all the allegations made by the acting. He states that his neighbor at the apartment complex has been calling him and his mother the "n" word. Patient denies being aggressive towards anybody but at the same time tells me "I was just defending myself".  Patient denies depressed mood, problems with his sleep, appetite, energy or concentration. He denies suicidality, homicidality or having auditory or visual hallucinations. Stated that he has been compliant with  medications.  Patient has history of tardive dyskinesia and is evident the patient has tremors and moderate intensity.  Substance abuse denies recent use of any substances. He has past history of cannabis use and he smokes about 2 packs of cigarettes per day  Past Psychiatric History: Patient carries a diagnosis of schizoaffective d/o. He has a multitude of hospitalizations there is started in his late teens. Patient stated he has been hospitalized at all [place], [place], [place], [place] and in [facility]. His last hospitalization was here in Sep of 2016. Currently receives acting services through [employer] number [phone].  Risk to Self: Is patient at risk for suicide?: No Risk to Others:  Yes  Risk to Self: Suicidal Ideation: No Suicidal Intent: No Is patient at risk for suicide?: No Suicidal Plan?: No Access to Means: No What has been your use of drugs/alcohol within the last 12 months?: THC How many times?: 0 (Pt denies) Other Self Harm Risks: UTA Triggers for Past Attempts: Other (Comment) (UTA) Intentional Self Injurious Behavior: None Risk to Others: Homicidal Ideation: Yes-Currently Present Thoughts of Harm to Others: Yes-Currently Present Comment - Thoughts of Harm to Others: States that he plans to harm his ACTT Team Lead  Peter Wilson) Current Homicidal Intent: Yes-Currently Present Current Homicidal Plan: Yes-Currently Present Describe Current Homicidal Plan: vague, PT implied that he would choke this individual  Access to Homicidal Means: Yes Identified Victim: Peter Wilson History of harm to others?: Yes Assessment of Violence: On admission Violent Behavior Description: Combative With staff  Does patient have access to weapons?:  (UTA) Criminal Charges Pending?: No Does patient have a court date:  (UTA) Prior Inpatient Therapy: Prior  Inpatient Therapy: Yes Prior Therapy Dates: 09/14/2015 Prior Therapy Facilty/Provider(s): [facility]   Reason for Treatment: Schizophrenia  Prior Outpatient Therapy: Prior Outpatient Therapy: Yes Prior Therapy Dates: Current  (Pt states he no longer intends to particiapte in program) Prior Therapy Facilty/Provider(s): [facility]  Reason for Treatment: Schizophrenia Does patient have an ACCT team?: Yes ([facility]- Pt requests that we do NOT contact ) Does patient have Intensive In-House Services?  : Unknown Does patient have [facility] services? : No Does patient have P4CC services?: No  Past Medical History:  Past Medical History  Diagnosis Date  . Schizophrenia, paranoid ([place])   . Tardive dyskinesia   . Tobacco abuse   . Marijuana abuse     Past Surgical History  Procedure Laterality Date  . No past surgeries     Family History: History reviewed. No pertinent family history.  Family Psychiatric  History: Reports that his father was diagnosed with schizophrenia  Social History: Patient is originally from [facility]; he is single and has 2 children ages 7 and 49-year-old. The 27 year old child lives with the child's mother and his 41-year-old child lives with the patient's mother. Patient had been living  at a local group home for years but now he is living in an apartment.. As far as his education patient states that he was "kicked out" from 12th grade.  History  Alcohol Use No     History  Drug Use  . Yes  . Special: Marijuana    Social History   Social History  . Marital Status: Single    Spouse Name: N/A  . Number of Children: N/A  . Years of Education: N/A   Social History Main Topics  . Smoking status: Current Every Day Smoker -- 2.00 packs/day    Types: Cigarettes  . Smokeless tobacco: None  . Alcohol Use: No  . Drug Use: Yes    Special: Marijuana  . Sexual Activity: Not Asked   Other Topics Concern  . None   Social History Narrative   Allergies:   Allergies  Allergen Reactions  . Milk-Related Compounds Hives    Labs:  Results  for orders placed or performed during the hospital encounter of 06/26/16 (from the past 48 hour(s))  CBC with Differential     Status: Abnormal   Collection Time: 06/26/16  4:47 PM  Result Value Ref Range   WBC 4.9 3.8 - 10.6 K/uL   RBC 4.27 (L) 4.40 - 5.90 MIL/uL   Hemoglobin 12.9 (L) 13.0 - 18.0 g/dL   HCT 39.1 (L) 40.0 - 52.0 %   MCV 91.6 80.0 - 100.0 fL   MCH 30.1 26.0 - 34.0 pg   MCHC 32.9 32.0 - 36.0 g/dL   RDW 14.8 (H) 11.5 - 14.5 %   Platelets 247 150 - 440 K/uL   Neutrophils Relative % 43 %   Neutro Abs 2.1 1.4 - 6.5 K/uL   Lymphocytes Relative 44 %   Lymphs Abs 2.2 1.0 - 3.6 K/uL   Monocytes Relative 10 %   Monocytes Absolute 0.5 0.2 - 1.0 K/uL   Eosinophils Relative 2 %   Eosinophils Absolute 0.1 0 - 0.7 K/uL   Basophils Relative 1 %   Basophils Absolute 0.0 0 - 0.1 K/uL  Comprehensive metabolic panel     Status: Abnormal   Collection Time: 06/26/16  4:47 PM  Result Value Ref Range   Sodium 138 135 - 145 mmol/L   Potassium 4.2 3.5 - 5.1 mmol/L  Chloride 103 101 - 111 mmol/L   CO2 28 22 - 32 mmol/L   Glucose, Bld 103 (H) 65 - 99 mg/dL   BUN 10 6 - 20 mg/dL   Creatinine, Ser 1.06 0.61 - 1.24 mg/dL   Calcium 9.5 8.9 - 10.3 mg/dL   Total Protein 8.0 6.5 - 8.1 g/dL   Albumin 4.2 3.5 - 5.0 g/dL   AST 57 (H) 15 - 41 U/L   ALT 52 17 - 63 U/L   Alkaline Phosphatase 60 38 - 126 U/L   Total Bilirubin 0.9 0.3 - 1.2 mg/dL   GFR calc non Af Amer >60 >60 mL/min   GFR calc Af Amer >60 >60 mL/min    Comment: (NOTE) The eGFR has been calculated using the CKD EPI equation. This calculation has not been validated in all clinical situations. eGFR's persistently <60 mL/min signify possible Chronic Kidney Disease.    Anion gap 7 5 - 15  Ethanol     Status: None   Collection Time: 06/26/16  4:47 PM  Result Value Ref Range   Alcohol, Ethyl (B) <5 <5 mg/dL    Comment:        LOWEST DETECTABLE LIMIT FOR SERUM ALCOHOL IS 5 mg/dL FOR MEDICAL PURPOSES ONLY   Acetaminophen  level     Status: Abnormal   Collection Time: 06/26/16  4:47 PM  Result Value Ref Range   Acetaminophen (Tylenol), Serum <10 (L) 10 - 30 ug/mL    Comment:        THERAPEUTIC CONCENTRATIONS VARY SIGNIFICANTLY. A RANGE OF 10-30 ug/mL MAY BE AN EFFECTIVE CONCENTRATION FOR MANY PATIENTS. HOWEVER, SOME ARE BEST TREATED AT CONCENTRATIONS OUTSIDE THIS RANGE. ACETAMINOPHEN CONCENTRATIONS >150 ug/mL AT 4 HOURS AFTER INGESTION AND >50 ug/mL AT 12 HOURS AFTER INGESTION ARE OFTEN ASSOCIATED WITH TOXIC REACTIONS.   Salicylate level     Status: None   Collection Time: 06/26/16  4:47 PM  Result Value Ref Range   Salicylate Lvl <6.6 2.8 - 30.0 mg/dL    Current Facility-Administered Medications  Medication Dose Route Frequency Provider Last Rate Last Dose  . albuterol (PROVENTIL) (2.5 MG/3ML) 0.083% nebulizer solution 3 mL  3 mL Inhalation Q4H PRN Orbie Pyo, MD      . amantadine (SYMMETREL) capsule 100 mg  100 mg Oral BID Orbie Pyo, MD   100 mg at 06/27/16 1044  . clonazePAM (KLONOPIN) tablet 1 mg  1 mg Oral QHS Orbie Pyo, MD   1 mg at 06/27/16 0004  . divalproex (DEPAKOTE ER) 24 hr tablet 1,500 mg  1,500 mg Oral QHS Orbie Pyo, MD   1,500 mg at 06/27/16 0004  . haloperidol (HALDOL) tablet 10 mg  10 mg Oral BID Orbie Pyo, MD   10 mg at 06/27/16 1044  . lactulose (CHRONULAC) 10 GM/15ML solution 20 g  20 g Oral Daily Orbie Pyo, MD   20 g at 06/27/16 1044   Current Outpatient Prescriptions  Medication Sig Dispense Refill  . albuterol (PROVENTIL) (2.5 MG/3ML) 0.083% nebulizer solution Inhale 3 mLs into the lungs every 4 (four) hours as needed for wheezing or shortness of breath. 75 mL 0  . amantadine (SYMMETREL) 100 MG capsule Take 1 capsule (100 mg total) by mouth 2 (two) times daily. 60 capsule 0  . clonazePAM (KLONOPIN) 1 MG tablet Take 1 tablet (1 mg total) by mouth at bedtime. 30 tablet 0  . divalproex (DEPAKOTE  ER) 500 MG 24 hr tablet Take 3  tablets (1,500 mg total) by mouth at bedtime. 90 tablet 0  . haloperidol (HALDOL) 10 MG tablet Take 1 tablet (10 mg total) by mouth 2 (two) times daily. (Patient taking differently: Take 10 mg by mouth 2 (two) times daily. Did not take today due to blood work 02/17/2016) 60 tablet 0  . haloperidol decanoate (HALDOL DECANOATE) 100 MG/ML injection Inject 2.5 mLs (250 mg total) into the muscle every 30 (thirty) days. 1 mL 0  . lactulose (CHRONULAC) 10 GM/15ML solution Take 30 mLs (20 g total) by mouth daily. 600 mL 0    Musculoskeletal: Strength & Muscle Tone: within normal limits Gait & Station: normal Patient leans: N/A  Psychiatric Specialty Exam: Physical Exam  Constitutional: He is oriented to person, place, and time. He appears well-developed and well-nourished.  HENT:  Head: Normocephalic and atraumatic.  Eyes: EOM are normal.  Neck: Normal range of motion.  Respiratory: Effort normal.  Musculoskeletal: Normal range of motion.  Neurological: He is alert and oriented to person, place, and time.    Review of Systems  Constitutional: Negative.   HENT: Negative.   Eyes: Negative.   Respiratory: Negative.   Cardiovascular: Negative.   Gastrointestinal: Negative.   Genitourinary: Negative.   Skin: Negative.   Neurological: Positive for tremors.  Psychiatric/Behavioral: Negative for depression, suicidal ideas, hallucinations, memory loss and substance abuse. The patient is not nervous/anxious and does not have insomnia.     Blood pressure 129/92, pulse 82, resp. rate 20, SpO2 98 %.There is no weight on file to calculate BMI.  General Appearance: Fairly Groomed  Eye Contact:  Good  Speech:  Clear and Coherent  Volume:  Normal  Mood:  Irritable  Affect:  Congruent  Thought Process:  Linear and Descriptions of Associations: Intact  Orientation:  Full (Time, Place, and Person)  Thought Content:  Paranoid Ideation  Suicidal Thoughts:  No  Homicidal  Thoughts:  No  Memory:  Immediate;   Fair Recent;   Fair Remote;   Fair  Judgement:  Poor  Insight:  Shallow  Psychomotor Activity:  Increased  Concentration:  Concentration: Fair and Attention Span: Fair  Recall:  [employer] of Knowledge:  Fair  Language:  Good  Akathisia:  No  Handed:    AIMS (if indicated):     Assets:  Agricultural consultant Housing Social Support  ADL's:  Intact  Cognition:  WNL  Sleep:       Treatment Plan Summary:  Patient will be admitted to our unit for stabilization.  Schizoaffective disorder bipolar type the patient will be continued on Haldol 10 mg by mouth twice a day.  I will contact history seals acting to see patient is is still taking Haldol Decanoate 250 mg every month. Continue Depakote 1500 mg daily at bedtime.  EPS and tardive dyskinesia continue amantadine 100 mg by mouth twice a day  Insomnia continue clonazepam 1 mg by mouth daily at bedtime  History of hyperammonemia secondary to treatment with Depakote continue lactulose daily  Tobacco use disorder patient will started on a nicotine patch 21 mg a day.  Precautions every 15 minute checks  Diet regular  Vital signs daily  Hospitalization and status continue involuntary commitment  Labs I will order hemoglobin A1c, lipid panel and prolactin level. I will also check a valproic acid acid, TSH and ammonia  Collateral information will be obtained from [place] patient will return to his apartment once stable  Disposition: Recommend psychiatric Inpatient admission  when medically cleared.  Hildred Priest, MD 06/27/2016 1:11 PM

## 2016-06-28 DIAGNOSIS — F250 Schizoaffective disorder, bipolar type: Principal | ICD-10-CM

## 2016-06-28 MED ORDER — OLANZAPINE 10 MG PO TABS
20.0000 mg | ORAL_TABLET | Freq: Every day | ORAL | Status: DC
  Administered 2016-06-28: 20 mg via ORAL
  Filled 2016-06-28 (×2): qty 2

## 2016-06-28 NOTE — [facility] Group Notes (Signed)
[facility] [name] Group Therapy   06/28/2016  9:30AM  Type of Therapy: Group Therapy   Participation Level: Did Not Attend. Patient invited to participate but declined.    Jennah Satchell F. Shiquita Collignon, MSW, LCSWA, LCAS     

## 2016-06-28 NOTE — Progress Notes (Signed)
Recreation Therapy Notes  Date: 07.05.17 Time: 1:00 pm Location: Craft Room  Group Topic: Self-esteem  Goal Area(s) Addresses:  Patient will identify at least one positive trait about self. Patient will identity at least one healthy coping skill.  Behavioral Response: Did not attend  Intervention: All About Me  Activity: Patients were instructed to make an All About Me pamphlet with their life's motto, positive traits, healthy coping skills, and their support system.  Education: LRT educated patients on ways they can increase their self-esteem.  Education Outcome: Patient did not attend group.  Clinical Observations/Feedback: Patient did not attend group.  Jacquelynn CreeGreene,Liese Dizdarevic M, LRT/CTRS 06/28/2016 2:49 PM

## 2016-06-28 NOTE — Tx Team (Signed)
Interdisciplinary Treatment Plan Update (Adult)        Date: 06/28/2016   Time Reviewed: 9:30 AM   Progress in Treatment: Improving  Attending groups: Continuing to assess, patient new to milieu  Participating in groups: Continuing to assess, patient new to milieu  Taking medication as prescribed: Yes  Tolerating medication: Yes  Family/Significant other contact made: No, CSW assessing for appropriate contacts  Patient understands diagnosis: Yes  Discussing patient identified problems/goals with staff: Yes  Medical problems stabilized or resolved: Yes  Denies suicidal/homicidal ideation: Yes  Issues/concerns per patient self-inventory: Yes  Other:   New problem(s) identified: N/A   Discharge Plan or Barriers: CSW continuing to assess, patient new to milieu.   Reason for Continuation of Hospitalization:   Depression   Anxiety   Medication Stabilization   Comments: N/A   Estimated length of stay: 3-5 days    Patient is a 40 year old African-American male with history of schizoaffective disorder, antisocial traits and tardive dyskinesia. Patient is under the care of [facility] ACT.  Patient brought in on 7/3 via BPD with IVC papers completed by ACT team. Papers state patient was threatening neighbors,burning self with cigarettes, non compliant with medications. Per ER notes pt was combative at arrival. He was uncooperative with ER physician and told him  "I'm going to kill that bitch." Patient is currently prescribed with Haldol Decanoate 250 mg IM every monthly, Haldol oral 20 mg by mouth twice a day, Depakote 1500 mg qhs, amantadine 100 mg by mouth twice a day and lactulose for hyperammonemia.  Patient denies all the allegations made by the acting. He states that his neighbor at the apartment complex has been calling him and his mother the "n" word. Patient denies being aggressive towards anybody but at the same time tells me "I was just defending myself". Patient denies  depressed mood, problems with his sleep, appetite, energy or concentration. He denies suicidality, homicidality or having auditory or visual hallucinations. Stated that he has been compliant with medications.  Patient has history of tardive dyskinesia and is evident the patient has tremors and moderate intensity.  Substance abuse denies recent use of any substances. He has past history of cannabis use and he smokes about 2 packs of cigarettes per day. Patient lives in [place]. Patient will benefit from crisis stabilization, medication evaluation, group therapy, and psycho education in addition to case management for discharge planning. Patient and CSW reviewed pt's identified goals and treatment plan. Pt verbalized understanding and agreed to treatment plan.    Review of initial/current patient goals per problem list:  1. Goal(s): Patient will participate in aftercare plan   Met: No  Target date: 3-5 days post admission date   As evidenced by: Patient will participate within aftercare plan AEB aftercare provider and housing plan at discharge being identified.   7/5: Goal progressing.    2. Goal (s): Patient will exhibit decreased depressive symptoms and suicidal ideations.   Met: No  Target date: 3-5 days post admission date   As evidenced by: Patient will utilize self-rating of depression at 3 or below and demonstrate decreased signs of depression or be deemed stable for discharge by MD.   7/5: Goal progressing.    3. Goal(s): Patient will demonstrate decreased signs and symptoms of anxiety.   Met: No  Target date: 3-5 days post admission date   As evidenced by: Patient will utilize self-rating of anxiety at 3 or below and demonstrated decreased signs of anxiety, or be deemed  stable for discharge by MD   7/5: Goal progressing.   4. Goal(s): Patient will demonstrate decreased signs of withdrawal due to substance abuse   Met: Yes  Target date: 3-5 days post admission date    As evidenced by: Patient will produce a CIWA/COWS score of 0, have stable vitals signs, and no symptoms of withdrawal   7/5: Patient produced a CIWA/COWS score of 0, has stable vitals signs, and no symptoms of withdrawal    5. Goal(s): Patient will demonstrate decreased signs of psychosis  * Met: No * Target date: 3-5 days post admission date  * As evidenced by: Patient will demonstrate decreased frequency of AVH or return to baseline function   7/5: Goal progressing.  Attendees:  Patient:  Family:  Physician: Dr. Jerilee Hoh, MD    06/28/2016 9:30 AM  Nursing: Polly Cobia, RN     06/28/2016 9:30 AM  Clinical Social Worker: Marylou Flesher, [place]  06/28/2016 9:30 AM  Nursing: Carolynn Sayers, RN    06/28/2016 9:30 AM  Recreational Therapist: Everitt Amber, LRT   06/28/2016 9:30 AM  Nursing: RN       06/28/2016 9:30 AM  Clinical Social Worker: Glorious Peach, [place]  06/28/2016 9:30 AM   Alphonse Guild. Salaya Holtrop, LCSWA, LCAS  06/28/16

## 2016-06-28 NOTE — [facility] Group Notes (Signed)
[facility] Group Notes:  (Nursing/MHT/Case Management/Adjunct)  Date:  06/28/2016  Time:  3:07 AM  Type of Therapy:  Group Therapy  Participation Level:  Did Not Attend   Summary of Progress/Problems:  Peter Wilson 06/28/2016, 3:07 AM

## 2016-06-28 NOTE — Plan of Care (Signed)
Problem: Activity: Goal: Interest or engagement in activities will improve Outcome: Not Progressing Patient takes meds this am, states his goal is to sleep all day. Refusing therapy groups at this time.

## 2016-06-28 NOTE — Plan of Care (Signed)
Problem: Safety: Goal: Ability to remain free from injury will improve Outcome: Progressing Patient has remained free from harm since admission.      

## 2016-06-28 NOTE — Progress Notes (Signed)
[facility] MD Progress Note  06/28/2016 1:26 PM Joahan Swatzell  MRN:  [id number] Subjective:  Patient with schizoaffective disorder bipolar type and antisocial traits. Admitted after his ACT team petitioned him for noncompliance with medications, aggression to his neighbors and burning himself with cigarettes.  Patient is well-known to our unit as he has been hospitalized a multitude of times. His last admission was in January of this year.  Patient currently prescribed with Haldol Decanoate 250 mg every month, Depakote ER 1500 mg daily at bedtime, Haldol oral 10 mg by mouth twice a day and amantadine 100 mg by mouth twice a day.  During prior admissions the patient was staying at group homes; now tells me he is living in an apartment by himself  Patient was lying in bed this morning. He told the nurses that he was not planning on participating in any group therapy today. There were no events of aggression or agitation overnight. Mood/affect continues to be irritable. He is slept well through the night and comply with all his medications.  Patient continues to deny having any aggression. He denies suicidality, homicidality or having auditory or visual hallucinations.    Principal Problem: Schizoaffective disorder ([place]) Diagnosis:   Patient Active Problem List   Diagnosis Date Noted  . Schizoaffective disorder, bipolar type ([place]) [F25.0] 06/27/2016  . Schizoaffective disorder ([place]) [F25.9] 06/27/2016  . Hyperammonemia ([place]) [E72.20] 02/17/2016  . Tardive dyskinesia [G24.01] 09/15/2015  . Antisocial traits [F60.2] 09/15/2015  . Tobacco use disorder [F17.200] 09/14/2015  . Cannabis use disorder, moderate, dependence ([facility]) [F12.20] 09/14/2015   Total Time spent with patient: 30 minutes  Past Psychiatric History: Schizoaffective disorder bipolar type and antisocial traits  Past Medical History:  Past Medical History  Diagnosis Date  . Schizophrenia, paranoid ([place])   . Tardive dyskinesia   . Tobacco  abuse   . Marijuana abuse     Past Surgical History  Procedure Laterality Date  . No past surgeries     Family History: History reviewed. No pertinent family history.   Family Psychiatric  History: Unknown  Social History:  History  Alcohol Use No     History  Drug Use  . Yes  . Special: Marijuana    Social History   Social History  . Marital Status: Single    Spouse Name: N/A  . Number of Children: N/A  . Years of Education: N/A   Social History Main Topics  . Smoking status: Current Every Day Smoker -- 2.00 packs/day    Types: Cigarettes  . Smokeless tobacco: None  . Alcohol Use: No  . Drug Use: Yes    Special: Marijuana  . Sexual Activity: Not Asked   Other Topics Concern  . None   Social History Narrative   Additional Social History:    History of alcohol / drug use?: Yes Name of Substance 1: Marijuana 1 - Age of First Use: 13 1 - Amount (size/oz): 6 1 - Frequency: daily 1 - Duration: years      Current Medications: Current Facility-Administered Medications  Medication Dose Route Frequency Provider Last Rate Last Dose  . acetaminophen (TYLENOL) tablet 650 mg  650 mg Oral Q6H PRN Hildred Priest, MD   650 mg at 06/27/16 2105  . albuterol (PROVENTIL) (2.5 MG/3ML) 0.083% nebulizer solution 3 mL  3 mL Inhalation Q4H PRN Hildred Priest, MD      . alum & mag hydroxide-simeth (MAALOX/MYLANTA) 200-200-20 MG/5ML suspension 30 mL  30 mL Oral Q4H PRN Hildred Priest, MD      .  amantadine (SYMMETREL) capsule 100 mg  100 mg Oral BID Hildred Priest, MD   100 mg at 06/28/16 0920  . clonazePAM (KLONOPIN) tablet 1 mg  1 mg Oral QHS Hildred Priest, MD   1 mg at 06/27/16 2102  . divalproex (DEPAKOTE ER) 24 hr tablet 1,500 mg  1,500 mg Oral QHS Hildred Priest, MD   1,500 mg at 06/27/16 2102  . lactulose (CHRONULAC) 10 GM/15ML solution 20 g  20 g Oral Daily Hildred Priest, MD   20 g at 06/28/16  0920  . LORazepam (ATIVAN) tablet 2 mg  2 mg Oral Q4H PRN Hildred Priest, MD       Or  . LORazepam (ATIVAN) injection 2 mg  2 mg Intramuscular Q4H PRN Hildred Priest, MD      . magnesium hydroxide (MILK OF MAGNESIA) suspension 30 mL  30 mL Oral Daily PRN Hildred Priest, MD      . nicotine (NICODERM CQ - dosed in mg/24 hours) patch 21 mg  21 mg Transdermal Q0600 Hildred Priest, MD   21 mg at 06/28/16 4970  . OLANZapine (ZYPREXA) injection 10 mg  10 mg Intramuscular BID PRN Hildred Priest, MD      . OLANZapine [facility]) tablet 20 mg  20 mg Oral QHS Hildred Priest, MD        Lab Results:  Results for orders placed or performed during the Wilson encounter of 06/26/16 (from the past 48 hour(s))  CBC with Differential     Status: Abnormal   Collection Time: 06/26/16  4:47 PM  Result Value Ref Range   WBC 4.9 3.8 - 10.6 K/uL   RBC 4.27 (L) 4.40 - 5.90 MIL/uL   Hemoglobin 12.9 (L) 13.0 - 18.0 g/dL   HCT 39.1 (L) 40.0 - 52.0 %   MCV 91.6 80.0 - 100.0 fL   MCH 30.1 26.0 - 34.0 pg   MCHC 32.9 32.0 - 36.0 g/dL   RDW 14.8 (H) 11.5 - 14.5 %   Platelets 247 150 - 440 K/uL   Neutrophils Relative % 43 %   Neutro Abs 2.1 1.4 - 6.5 K/uL   Lymphocytes Relative 44 %   Lymphs Abs 2.2 1.0 - 3.6 K/uL   Monocytes Relative 10 %   Monocytes Absolute 0.5 0.2 - 1.0 K/uL   Eosinophils Relative 2 %   Eosinophils Absolute 0.1 0 - 0.7 K/uL   Basophils Relative 1 %   Basophils Absolute 0.0 0 - 0.1 K/uL  Comprehensive metabolic panel     Status: Abnormal   Collection Time: 06/26/16  4:47 PM  Result Value Ref Range   Sodium 138 135 - 145 mmol/L   Potassium 4.2 3.5 - 5.1 mmol/L   Chloride 103 101 - 111 mmol/L   CO2 28 22 - 32 mmol/L   Glucose, Bld 103 (H) 65 - 99 mg/dL   BUN 10 6 - 20 mg/dL   Creatinine, Ser 1.06 0.61 - 1.24 mg/dL   Calcium 9.5 8.9 - 10.3 mg/dL   Total Protein 8.0 6.5 - 8.1 g/dL   Albumin 4.2 3.5 - 5.0 g/dL   AST 57 (H) 15  - 41 U/L   ALT 52 17 - 63 U/L   Alkaline Phosphatase 60 38 - 126 U/L   Total Bilirubin 0.9 0.3 - 1.2 mg/dL   GFR calc non Af Amer >60 >60 mL/min   GFR calc Af Amer >60 >60 mL/min    Comment: (NOTE) The eGFR has been calculated using the CKD EPI equation.  This calculation has not been validated in all clinical situations. eGFR's persistently <60 mL/min signify possible Chronic Kidney Disease.    Anion gap 7 5 - 15  Ethanol     Status: None   Collection Time: 06/26/16  4:47 PM  Result Value Ref Range   Alcohol, Ethyl (B) <5 <5 mg/dL    Comment:        LOWEST DETECTABLE LIMIT FOR SERUM ALCOHOL IS 5 mg/dL FOR MEDICAL PURPOSES ONLY   Acetaminophen level     Status: Abnormal   Collection Time: 06/26/16  4:47 PM  Result Value Ref Range   Acetaminophen (Tylenol), Serum <10 (L) 10 - 30 ug/mL    Comment:        THERAPEUTIC CONCENTRATIONS VARY SIGNIFICANTLY. A RANGE OF 10-30 ug/mL MAY BE AN EFFECTIVE CONCENTRATION FOR MANY PATIENTS. HOWEVER, SOME ARE BEST TREATED AT CONCENTRATIONS OUTSIDE THIS RANGE. ACETAMINOPHEN CONCENTRATIONS >150 ug/mL AT 4 HOURS AFTER INGESTION AND >50 ug/mL AT 12 HOURS AFTER INGESTION ARE OFTEN ASSOCIATED WITH TOXIC REACTIONS.   Salicylate level     Status: None   Collection Time: 06/26/16  4:47 PM  Result Value Ref Range   Salicylate Lvl <1.6 2.8 - 30.0 mg/dL  Valproic acid level     Status: Abnormal   Collection Time: 06/27/16  5:01 PM  Result Value Ref Range   Valproic Acid Lvl 43 (L) 50.0 - 100.0 ug/mL    Blood Alcohol level:  Lab Results  Component Value Date   ETH <5 06/26/2016   ETH <5 10/96/0454    Metabolic Disorder Labs: Lab Results  Component Value Date   HGBA1C 5.0 01/27/2016   Lab Results  Component Value Date   PROLACTIN 40.2* 01/27/2016   Lab Results  Component Value Date   CHOL 150 01/27/2016   TRIG 83 01/27/2016   HDL 44 01/27/2016   CHOLHDL 3.4 01/27/2016   VLDL 17 01/27/2016   LDLCALC 89 01/27/2016   LDLCALC  117* 09/15/2015    Physical Findings: AIMS:  , ,  ,  , Dental Status Current problems with teeth and/or dentures?: No Does patient usually wear dentures?: No  CIWA:    COWS:     Musculoskeletal: Strength & Muscle Tone: within normal limits Gait & Station: normal Patient leans: N/A  Psychiatric Specialty Exam: Physical Exam  Constitutional: He is oriented to person, place, and time. He appears well-developed and well-nourished.  HENT:  Head: Normocephalic and atraumatic.  Eyes: EOM are normal.  Neck: Normal range of motion.  Respiratory: Effort normal.  Musculoskeletal: Normal range of motion.  Neurological: He is alert and oriented to person, place, and time.    Review of Systems  Constitutional: Negative.   HENT: Negative.   Eyes: Negative.   Respiratory: Negative.   Cardiovascular: Negative.   Gastrointestinal: Negative.   Genitourinary: Negative.   Musculoskeletal: Negative.   Skin: Negative.   Neurological: Negative.   Endo/Heme/Allergies: Negative.   Psychiatric/Behavioral: Negative.     Blood pressure 101/73, pulse 72, temperature 98.2 F (36.8 C), temperature source Oral, resp. rate 18, height 6' (1.829 m), weight 79.833 kg (176 lb), SpO2 100 %.Body mass index is 23.86 kg/(m^2).  General Appearance: Fairly Groomed  Eye Contact:  Minimal  Speech:  Clear and Coherent  Volume:  Normal  Mood:  Irritable  Affect:  Constricted  Thought Process:  Linear and Descriptions of Associations: Intact  Orientation:  Full (Time, Place, and Person)  Thought Content:  Paranoid Ideation  Suicidal Thoughts:  No  Homicidal Thoughts:  No  Memory:  Immediate;   Fair Recent;   Fair Remote;   Fair  Judgement:  Poor  Insight:  Shallow  Psychomotor Activity:  Decreased  Concentration:  Concentration: Fair and Attention Span: Fair  Recall:  [employer] of Knowledge:  Fair  Language:  Good  Akathisia:  No  Handed:    AIMS (if indicated):     Assets:  Financial  Resources/Insurance Housing Social Support  ADL's:  Intact  Cognition:  WNL  Sleep:  Number of Hours: 7.25     Treatment Plan Summary:  Schizoaffective disorder bipolar type: The patient will be started on olanzapine 20 mg by mouth qhs.  He will be continued on Haldol Decanoate 250 mg by mouth daily at bedtime. For mood stabilization he will be continued on  Depakote 1500 mg daily at bedtime.  I we will discontinue Haldol oral 10 mg by mouth twice a day as he has started dyskinesia and significant tremors.  EPS and tardive dyskinesia continue amantadine 100 mg by mouth twice a day  Insomnia continue clonazepam 1 mg by mouth daily at bedtime  History of hyperammonemia secondary to treatment with Depakote continue lactulose daily  Tobacco use disorder patient will started on a nicotine patch 21 mg a day.  Precautions every 15 minute checks  Diet regular  Vital signs daily  Hospitalization and status continue involuntary commitment  Labs: Random Depakote level checked yesterday was 43--- likely indicating noncompliance.  Collateral information will be obtained from [facility] patient will return to his apartment once stable    Hildred Priest, MD 06/28/2016, 1:26 PM
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
## 2016-06-28 NOTE — Progress Notes (Signed)
Patient with depressed affect, cooperative with am meds. No SI/HI/AVH at this time. States his daily goal is to sleep all day". Refuses daily goal sheet and therapy groups at this time. Refuses lactulose. Minimal interaction with peers, withdrawn in room. Safety maintained.

## 2016-06-28 NOTE — [facility] Group Notes (Signed)
[facility] Group Notes:  (Nursing/MHT/Case Management/Adjunct)  Date:  06/28/2016  Time:  6:25 PM  Type of Therapy:  Psychoeducational Skills  Participation Level:  Active  Participation Quality:  Appropriate, Attentive and Sharing  Affect:  Appropriate  Cognitive:  Alert and Appropriate  Insight:  Appropriate  Engagement in Group:  Engaged  Modes of Intervention:  Discussion, Education and Support  Summary of Progress/Problems:  Peter Wilson 06/28/2016, 6:25 PM 

## 2016-06-28 NOTE — Progress Notes (Signed)
D: Patient appears irritable throughout the evening. Stated he wanted his medication at 2000 but was educated he had to wait until 2100. He started cussing and went to room. At 2100 he stated since this writer did not let him take his medication at 2000 he wasn't going to take it. The patient was redirected and encouraged to take it. The patient was then willing to take the medication. He has been visible in the milieu. States he has a HA rated at a 5.  A: Medication given with education. Encouragement provided. PRN Tylenol given for pain.  R: Patient was compliant with all the medications except he only took 1000 mg depakote instead of a full 1500 mg. Patient stated depakote messed up his liver and put him in the hospital the last time he took too much. Compliant with other medication. Safety maintained with 15 min checks.

## 2016-06-28 NOTE — [facility] Suicide Risk Assessment (Signed)
[facility] INPATIENT:  Family/Significant Other Suicide Prevention Education  Suicide Prevention Education:  Patient Refusal for Family/Significant Other Suicide Prevention Education: The patient Peter Wilson has refused to provide written consent for family/significant other to be provided Family/Significant Other Suicide Prevention Education during admission and/or prior to discharge.  Physician notified.  Pt refused SPE from the CSW.  Dorothe PeaJonathan F Mayanna Garlitz 06/28/2016, 2:56 PM

## 2016-06-29 MED ORDER — OLANZAPINE 10 MG PO TABS
30.0000 mg | ORAL_TABLET | Freq: Every day | ORAL | Status: DC
  Administered 2016-06-29 – 2016-07-04 (×6): 30 mg via ORAL
  Filled 2016-06-29 (×6): qty 3

## 2016-06-29 MED ORDER — DIVALPROEX SODIUM ER 500 MG PO TB24
1000.0000 mg | ORAL_TABLET | Freq: Every day | ORAL | Status: DC
  Administered 2016-06-29 – 2016-07-04 (×6): 1000 mg via ORAL
  Filled 2016-06-29 (×6): qty 2

## 2016-06-29 NOTE — Progress Notes (Signed)
D: Patient is alert and oriented on the unit this shift. Patient attended and actively participated in groups today. Patient denies suicidal ideation, homicidal ideation, auditory or visual hallucinations at the present time.  A: Scheduled medications are administered to patient as per MD orders. Emotional support and encouragement are provided. Patient is maintained on q.15 minute safety checks. Patient is informed to notify staff with questions or concerns. R: No adverse medication reactions are noted. Patient is cooperative with medication administration and treatment plan today. Patient is  Hyper vocally,  Intrusive but  cooperative on the unit at this time. Patient interacts well with others on the unit this shift. Patient contracts for safety at this time. Patient remains safe at this time. Anxiety 4/10,depression 4/10

## 2016-06-29 NOTE — B[facility] Group Notes (Signed)
[facility] [name] Group Therapy   06/29/2016  9:30AM  Type of Therapy: Group Therapy   Participation Level: Did Not Attend. Patient invited to participate but declined.    Rexann Lueras F. Lavell Supple, MSW, LCSWA, LCAS     

## 2016-06-29 NOTE — [facility] Group Notes (Signed)
[facility] [name] Aftercare Discharge Planning Group Note  06/29/2016 1PM  Participation Quality: Did Not Attend. Patient invited to participate but declined.   Peter Wilson, MSW, LCSWA, LCAS   

## 2016-06-29 NOTE — Progress Notes (Signed)
Pt became agitated around lunch time while on phone call. Pacing halls, swearing and cursing with threats of harming someone outside of hospital. Encouraged pt to calm down, pt reports needing to walk to calm down. PRN given to assist patient. Effective, pt currently in bed sleeping. Remains safe on unit with q 15 min checks.

## 2016-06-29 NOTE — Progress Notes (Signed)
[facility] MD Progress Note  06/29/2016 12:02 PM Peter Wilson  MRN:  [id number] Subjective:  Patient with schizoaffective disorder bipolar type and antisocial traits. Admitted after his ACT team petitioned him for noncompliance with medications, aggression to his neighbors and burning himself with cigarettes.  Patient is well-known to our unit as he has been hospitalized a multitude of times. His last admission was in January of this year.  Patient currently prescribed with Haldol Decanoate 250 mg every month, Depakote ER 1500 mg daily at bedtime, Haldol oral 10 mg by mouth twice a day and amantadine 100 mg by mouth twice a day.  During prior admissions the patient was staying at group homes; now tells me he is living in an apartment by himself  Patient was lying in bed this morning. He has been staying in his room a lot. He is usually sleeping late. He finally went to some groups yesterday. There were no events of aggression or agitation overnight. Mood/affect continues to be irritable. He is slept well through the night and comply with all his medications.  Patient continues to deny having any aggression. He denies suicidality, homicidality or having auditory or visual hallucinations.  Patient reports that the dose of Depakote was changed by his outpatient psychiatrist from 2002 at [facility]. He tells me that back in February he was hospitalized on the medical floor for side effects from Depakote. The dose of Depakote was decreased 1500.  Soon after that his outpatient psychiatrist decreased the dose to 1000 mg.  Per records he was hospitalized in February for mild Depakote toxicity and hyperammonemia.  Social worker tells me today that due to his high level of aggression and his acting is no longer willing to work with him. He has communicated threats against them. Also he has been communicating threats to other people in the apartment complex and his landlord will be evicting him soon.  His acting a his care  coordinator had requested for [place] to make a referral to [facility] for long-term hospitalization as this patient is frequently aggressive, noncompliant and has had multiple hospitalizations due to these issues  Per nursing: D: Patient is alert and oriented on the unit this shift. Patient attended and actively participated in groups today. Patient denies suicidal ideation, homicidal ideation, auditory or visual hallucinations at the present time.  A: Scheduled medications are administered to patient as per MD orders. Emotional support and encouragement are provided. Patient is maintained on q.15 minute safety checks. Patient is informed to notify staff with questions or concerns. R: No adverse medication reactions are noted. Patient is cooperative with medication administration and treatment plan today. Patient is Hyper vocally, Intrusive but cooperative on the unit at this time. Patient interacts well with others on the unit this shift. Patient contracts for safety at this time. Patient remains safe at this time. Anxiety 4/10,depression 4/10          Principal Problem: Schizoaffective disorder (HCC) Diagnosis:   Patient Active Problem List   Diagnosis Date Noted  . Schizoaffective disorder, bipolar type (HCC) [F25.0] 06/27/2016  . Schizoaffective disorder (HCC) [F25.9] 06/27/2016  . Hyperammonemia (HCC) [E72.20] 02/17/2016  . Tardive dyskinesia [G24.01] 09/15/2015  . Antisocial traits [F60.2] 09/15/2015  . Tobacco use disorder [F17.200] 09/14/2015  . Cannabis use disorder, moderate, dependence (HCC) [F12.20] 09/14/2015   Total Time spent with patient: 30 minutes  Past Psychiatric History: Schizoaffective disorder bipolar type and antisocial traits  Past Medical History:  Past Medical History  Diagnosis Date  .  Schizophrenia, paranoid (HCC)   . Tardive dyskinesia   . Tobacco abuse   . Marijuana abuse     Past Surgical History  Procedure Laterality Date  . No  past surgeries     Family History: History reviewed. No pertinent family history.   Family Psychiatric  History: Unknown  Social History:  History  Alcohol Use No     History  Drug Use  . Yes  . Special: Marijuana    Social History   Social History  . Marital Status: Single    Spouse Name: N/A  . Number of Children: N/A  . Years of Education: N/A   Social History Main Topics  . Smoking status: Current Every Day Smoker -- 2.00 packs/day    Types: Cigarettes  . Smokeless tobacco: None  . Alcohol Use: No  . Drug Use: Yes    Special: Marijuana  . Sexual Activity: Not Asked   Other Topics Concern  . None   Social History Narrative   Additional Social History:    History of alcohol / drug use?: Yes Name of Substance 1: Marijuana 1 - Age of First Use: 13 1 - Amount (size/oz): 6 1 - Frequency: daily 1 - Duration: years      Current Medications: Current Facility-Administered Medications  Medication Dose Route Frequency Provider Last Rate Last Dose  . acetaminophen (TYLENOL) tablet 650 mg  650 mg Oral Q6H PRN Jimmy Footman, MD   650 mg at 06/28/16 2001  . albuterol (PROVENTIL) (2.5 MG/3ML) 0.083% nebulizer solution 3 mL  3 mL Inhalation Q4H PRN Jimmy Footman, MD      . alum & mag hydroxide-simeth (MAALOX/MYLANTA) 200-200-20 MG/5ML suspension 30 mL  30 mL Oral Q4H PRN Jimmy Footman, MD      . amantadine (SYMMETREL) capsule 100 mg  100 mg Oral BID Jimmy Footman, MD   100 mg at 06/29/16 1610  . clonazePAM (KLONOPIN) tablet 1 mg  1 mg Oral QHS Jimmy Footman, MD   1 mg at 06/28/16 2141  . divalproex (DEPAKOTE ER) 24 hr tablet 1,000 mg  1,000 mg Oral QHS Jimmy Footman, MD      . LORazepam (ATIVAN) tablet 2 mg  2 mg Oral Q4H PRN Jimmy Footman, MD       Or  . LORazepam (ATIVAN) injection 2 mg  2 mg Intramuscular Q4H PRN Jimmy Footman, MD      . magnesium hydroxide (MILK OF  MAGNESIA) suspension 30 mL  30 mL Oral Daily PRN Jimmy Footman, MD      . nicotine (NICODERM CQ - dosed in mg/24 hours) patch 21 mg  21 mg Transdermal Q0600 Jimmy Footman, MD   21 mg at 06/28/16 9604  . OLANZapine (ZYPREXA) injection 10 mg  10 mg Intramuscular BID PRN Jimmy Footman, MD      . OLANZapine (ZYPREXA) tablet 20 mg  20 mg Oral QHS Jimmy Footman, MD   20 mg at 06/28/16 2142    Lab Results:  Results for orders placed or performed during the hospital encounter of 06/26/16 (from the past 48 hour(s))  Valproic acid level     Status: Abnormal   Collection Time: 06/27/16  5:01 PM  Result Value Ref Range   Valproic Acid Lvl 43 (L) 50.0 - 100.0 ug/mL    Blood Alcohol level:  Lab Results  Component Value Date   [facility] <5 06/26/2016   ETH <5 01/24/2016    Metabolic Disorder Labs: Lab Results  Component Value Date  HGBA1C 5.0 01/27/2016   Lab Results  Component Value Date   PROLACTIN 40.2* 01/27/2016   Lab Results  Component Value Date   CHOL 150 01/27/2016   TRIG 83 01/27/2016   HDL 44 01/27/2016   CHOLHDL 3.4 01/27/2016   VLDL 17 01/27/2016   LDLCALC 89 01/27/2016   LDLCALC 117* 09/15/2015    Physical Findings: AIMS: Facial and Oral Movements Muscles of Facial Expression: None, normal Lips and Perioral Area: None, normal Jaw: None, normal Tongue: None, normal,Extremity Movements Upper (arms, wrists, hands, fingers): None, normal Lower (legs, knees, ankles, toes): None, normal, Trunk Movements Neck, shoulders, hips: None, normal, Overall Severity Severity of abnormal movements (highest score from questions above): None, normal Incapacitation due to abnormal movements: None, normal Patient's awareness of abnormal movements (rate only patient's report): No Awareness, Dental Status Current problems with teeth and/or dentures?: No Does patient usually wear dentures?: No  CIWA:    COWS:     Musculoskeletal: Strength &  Muscle Tone: within normal limits Gait & Station: normal Patient leans: N/A  Psychiatric Specialty Exam: Physical Exam  Constitutional: He is oriented to person, place, and time. He appears well-developed and well-nourished.  HENT:  Head: Normocephalic and atraumatic.  Eyes: EOM are normal.  Neck: Normal range of motion.  Respiratory: Effort normal.  Musculoskeletal: Normal range of motion.  Neurological: He is alert and oriented to person, place, and time.    Review of Systems  Constitutional: Negative.   HENT: Negative.   Eyes: Negative.   Respiratory: Negative.   Cardiovascular: Negative.   Gastrointestinal: Negative.   Genitourinary: Negative.   Musculoskeletal: Negative.   Skin: Negative.   Neurological: Negative.   Endo/Heme/Allergies: Negative.   Psychiatric/Behavioral: Negative.     Blood pressure 101/65, pulse 67, temperature 98 F (36.7 C), temperature source Oral, resp. rate 18, height 6' (1.829 m), weight 79.833 kg (176 lb), SpO2 100 %.Body mass index is 23.86 kg/(m^2).  General Appearance: Fairly Groomed  Eye Contact:  Minimal  Speech:  Clear and Coherent  Volume:  Normal  Mood:  Irritable  Affect:  Constricted  Thought Process:  Linear and Descriptions of Associations: Intact  Orientation:  Full (Time, Place, and Person)  Thought Content:  Paranoid Ideation  Suicidal Thoughts:  No  Homicidal Thoughts:  No  Memory:  Immediate;   Fair Recent;   Fair Remote;   Fair  Judgement:  Poor  Insight:  Shallow  Psychomotor Activity:  Decreased  Concentration:  Concentration: Fair and Attention Span: Fair  Recall:  [employer]  Fund of Knowledge:  Fair  Language:  Good  Akathisia:  No  Handed:    AIMS (if indicated):     Assets:  Financial Resources/Insurance Housing Social Support  ADL's:  Intact  Cognition:  WNL  Sleep:  Number of Hours: 7     Treatment Plan Summary:  Schizoaffective disorder bipolar type: The patient has been started on olanzapine 20 mg  by mouth qhs.  He will be continued on Haldol Decanoate 250 mg by mouth daily at bedtime. q 30 days   Continue depakote ER but will decrease to 1000 mg qhs  I have d/c Haldol oral 10 mg by mouth twice a day as he has started dyskinesia and significant tremors.  EPS and tardive dyskinesia continue amantadine 100 mg by mouth twice a day  Insomnia continue clonazepam but will decrease to 0.5 mg as patient has been sleeping late in the mornings   History of hyperammonemia secondary to treatment  with Depakote. Pt says depakote dose has been reduced to 100 mg qhs.  Refuses to take lactulose  Tobacco use disorder patient will started on a nicotine patch 21 mg a day.  Precautions every 15 minute checks  Diet regular  Vital signs daily  Hospitalization and status continue involuntary commitment  Labs: Random Depakote level checked yesterday was 43--- likely indicating noncompliance.  Collateral information will be obtained from [facility]---They will not continue working with this patient as he has communicated threats against them. Social worker has made referrals to other act teams  Dispo patient will return to his apartment once stable--- social worker reported to me today that due to making threats the patient will be evicted.  Social worker will [facility] as requested by his act team and by his care coordinator. They report they have been unable to maintain his stability for very long.  Jimmy FootmanHernandez-Gonzalez,  Hanalei Glace, MD 06/29/2016, 12:02 PM

## 2016-06-29 NOTE — Plan of Care (Signed)
Problem: Education: Goal: Emotional status will improve Outcome: Not Progressing Pt easily agitated

## 2016-06-29 NOTE — H&P (Signed)
Psychiatric Admission Assessment Adult  Patient Identification: Peter Wilson MRN:  [id number] Date of Evaluation:  06/29/2016 Chief Complaint:  schizophrenia  Principal Diagnosis: Schizoaffective disorder (HCC) Diagnosis:   Patient Active Problem List   Diagnosis Date Noted  . Schizoaffective disorder, bipolar type (HCC) [F25.0] 06/27/2016  . Schizoaffective disorder (HCC) [F25.9] 06/27/2016  . Hyperammonemia (HCC) [E72.20] 02/17/2016  . Tardive dyskinesia [G24.01] 09/15/2015  . Antisocial traits [F60.2] 09/15/2015  . Tobacco use disorder [F17.200] 09/14/2015  . Cannabis use disorder, moderate, dependence (HCC) [F12.20] 09/14/2015   History of Present Illness:   Patient is a 40 year old African-American male with history of schizoaffective disorder, antisocial traits and tardive dyskinesia. Patient is under the care of [facility].  Patient brought in on 7/3 via BPD with IVC papers completed by ACT team. Papers state patient was threatening neighbors,burning self with cigarettes, non compliant with medications. Per ER notes pt was combative at arrival. He was uncooperative with ER physician and told him  "I'm going to kill that bitch."  Patient is currently prescribed with Haldol Decanoate 250 mg IM every monthly, Haldol oral 20 mg by mouth twice a day, Depakote 1500 mg qhs, amantadine 100 mg by mouth twice a day and lactulose for hyperammonemia.  Patient denies all the allegations made by the acting. He states that his neighbor at the apartment complex has been calling him and his mother the "n" word. Patient denies being aggressive towards anybody but at the same time tells me "I was just defending myself".  Patient denies depressed mood, problems with his sleep, appetite, energy or concentration. He denies suicidality, homicidality or having auditory or visual hallucinations. Stated that he has been compliant with medications.  Patient has history of tardive dyskinesia and is  evident the patient has tremors and moderate intensity.  Substance abuse denies recent use of any substances. He has past history of cannabis use and he smokes about 2 packs of cigarettes per day   Associated Signs/Symptoms: Depression Symptoms:  denies (Hypo) Manic Symptoms:  Delusions, Elevated Mood, Impulsivity, Irritable Mood, Anxiety Symptoms:  denies Psychotic Symptoms:  Paranoia, PTSD Symptoms: NA   Total Time spent with patient: 1 hour  Past Psychiatric History:  Patient carries a diagnosis of schizoaffective d/o. He has a multitude of hospitalizations there is started in his late teens. Patient stated he has been hospitalized at all [place], [place], [place], [place] and in [facility]. His last hospitalization was here in Sep of 2016. Currently receives acting services through [employer] number [phone].  Is the patient at risk to self? No.  Has the patient been a risk to self in the past 6 months? No.  Has the patient been a risk to self within the distant past? No.  Is the patient a risk to others? Yes.    Has the patient been a risk to others in the past 6 months? No.  Has the patient been a risk to others within the distant past? Yes.       Past Medical History:  Past Medical History  Diagnosis Date  . Schizophrenia, paranoid (HCC)   . Tardive dyskinesia   . Tobacco abuse   . Marijuana abuse     Past Surgical History  Procedure Laterality Date  . No past surgeries     Family History: History reviewed. No pertinent family history.  Family Psychiatric  History: Reports that his father was diagnosed with schizophrenia  Social History: [place]; he is single and has 2 children ages 4613 and 39-year-old. The 39 year old child lives with the child's mother and his 39-year-old child lives with the patient's mother. Patient had been living at a local group home for years but now he is  living in an apartment.. As far as his education patient states that he was "kicked out" from 12th grade.    Tobacco Screening: 2 packs per day  Social History:  History  Alcohol Use No     History  Drug Use  . Yes  . Special: Marijuana    Additional Social History:      History of alcohol / drug use?: Yes Name of Substance 1: Marijuana 1 - Age of First Use: 13 1 - Amount (size/oz): 6 1 - Frequency: daily 1 - Duration: years                  Allergies:   No Known Allergies Lab Results:  No results found for this or any previous visit (from the past 48 hour(s)).  Blood Alcohol level:  Lab Results  Component Value Date   [facility] 06/26[facility]2016   ETH <5 01/24/2016    Metabolic Disorder Labs:  Lab Results  Component Value Date   HGBA1C 5.0 01/27/2016   Lab Results  Component Value Date   PROLACTIN 40.2* 01/27/2016   Lab Results  Component Value Date   CHOL 150 01/27/2016   TRIG 83 01/27/2016   HDL 44 01/27/2016   CHOLHDL 3.4 01/27/2016   VLDL 17 01/27/2016   LDLCALC 89 01/27/2016   LDLCALC 117* 09/15/2015    Current Medications: Current Facility-Administered Medications  Medication Dose Route Frequency Provider Last Rate Last Dose  . acetaminophen (TYLENOL) tablet 650 mg  650 mg Oral Q6H PRN Jimmy FootmanAndrea Hernandez-Gonzalez, MD   650 mg at 06/28/16 2001  . albuterol (PROVENTIL) (2.5 MG/3ML) 0.083% nebulizer solution 3 mL  3 mL Inhalation Q4H PRN Jimmy FootmanAndrea Hernandez-Gonzalez, MD      . alum & mag hydroxide-simeth (MAALOX/MYLANTA) 200-200-20 MG/5ML suspension 30 mL  30 mL Oral Q4H PRN Jimmy FootmanAndrea Hernandez-Gonzalez, MD      . amantadine (SYMMETREL) capsule 100 mg  100 mg Oral BID Jimmy FootmanAndrea Hernandez-Gonzalez, MD   100 mg at 06/29/16 [id number]  . clonazePAM (KLONOPIN) tablet 1 mg  1 mg Oral QHS Jimmy FootmanAndrea Hernandez-Gonzalez, MD   1 mg at 06/28/16 2141  . divalproex (DEPAKOTE ER) 24 hr tablet 1,000 mg  1,000 mg Oral QHS Jimmy FootmanAndrea Hernandez-Gonzalez, MD      . LORazepam (ATIVAN) tablet  2 mg  2 mg Oral Q4H PRN Jimmy FootmanAndrea Hernandez-Gonzalez, MD   2 mg at 06/29/16 1251   Or  . LORazepam (ATIVAN) injection 2 mg  2 mg Intramuscular Q4H PRN Jimmy FootmanAndrea Hernandez-Gonzalez, MD      . magnesium hydroxide (MILK OF MAGNESIA) suspension 30 mL  30 mL Oral Daily PRN Jimmy FootmanAndrea Hernandez-Gonzalez, MD      . nicotine (NICODERM CQ - dosed in mg/24 hours) patch 21 mg  21 mg Transdermal Q0600 Jimmy FootmanAndrea Hernandez-Gonzalez, MD   21 mg at 06/28/16 [id number]  . OLANZapine (ZYPREXA) injection 10 mg  10 mg Intramuscular BID PRN Jimmy FootmanAndrea Hernandez-Gonzalez, MD   10 mg at 06/29/16 1249  . OLANZapine (ZYPREXA) tablet 20 mg  20 mg Oral QHS Jimmy FootmanAndrea Hernandez-Gonzalez, MD   20 mg at 06/28/16 2142   PTA Medications: Prescriptions prior to admission  Medication Sig Dispense Refill Last Dose  . albuterol (PROVENTIL) (2.5 MG/3ML) 0.083% nebulizer solution Inhale  3 mLs into the lungs every 4 (four) hours as needed for wheezing or shortness of breath. 75 mL 0 PRN at PRN  . amantadine (SYMMETREL) 100 MG capsule Take 1 capsule (100 mg total) by mouth 2 (two) times daily. 60 capsule 0 02/17/2016 at Unknown time  . clonazePAM (KLONOPIN) 1 MG tablet Take 1 tablet (1 mg total) by mouth at bedtime. 30 tablet 0 02/17/2016 at Unknown time  . divalproex (DEPAKOTE ER) 500 MG 24 hr tablet Take 3 tablets (1,500 mg total) by mouth at bedtime. 90 tablet 0   . haloperidol (HALDOL) 10 MG tablet Take 1 tablet (10 mg total) by mouth 2 (two) times daily. (Patient taking differently: Take 10 mg by mouth 2 (two) times daily. Did not take today due to blood work 02/17/2016) 60 tablet 0 Past Week at Unknown time  . haloperidol decanoate (HALDOL DECANOATE) 100 MG/ML injection Inject 2.5 mLs (250 mg total) into the muscle every 30 (thirty) days. 1 mL 0 Past Month at Unknown time  . lactulose (CHRONULAC) 10 GM/15ML solution Take 30 mLs (20 g total) by mouth daily. 600 mL 0     Musculoskeletal: Strength & Muscle Tone: within normal limits Gait & Station:  normal Patient leans: N/A  Psychiatric Specialty Exam: Physical Exam  Constitutional: He is oriented to person, place, and time. He appears well-developed and well-nourished.  HENT:  Head: Normocephalic and atraumatic.  Eyes: EOM are normal.  Neck: Normal range of motion.  Respiratory: Effort normal.  Musculoskeletal: Normal range of motion.  Neurological: He is alert and oriented to person, place, and time.    Review of Systems  Neurological: Positive for tremors.  Psychiatric/Behavioral: Negative for depression, suicidal ideas, hallucinations, memory loss and substance abuse. The patient is not nervous/anxious and does not have insomnia.   All other systems reviewed and are negative.   Blood pressure 101/65, pulse 67, temperature 98 F (36.7 C), temperature source Oral, resp. rate 18, height 6' (1.829 m), weight 79.833 kg (176 lb), SpO2 100 %.Body mass index is 23.86 kg/(m^2).  General Appearance: Fairly Groomed  Eye Contact:  Good  Speech:  Blocked  Volume:  Normal  Mood:  Irritable  Affect:  Congruent  Thought Process:  Linear and Descriptions of Associations: Intact  Orientation:  Full (Time, Place, and Person)  Thought Content:  Paranoid Ideation  Suicidal Thoughts:  No  Homicidal Thoughts:  No  Memory:  Immediate;   Fair Recent;   Fair Remote;   Fair  Judgement:  Poor  Insight:  Shallow  Psychomotor Activity:  Normal  Concentration:  Concentration: Poor and Attention Span: Poor  Recall:  [employer] of Knowledge:  Fair  Language:  Fair  Akathisia:  No  Handed:    AIMS (if indicated):     Assets:  Architect Housing Social Support  ADL's:  Intact  Cognition:  WNL  Sleep:  Number of Hours: 7     Treatment Plan Summary:  Patient will be admitted to our unit for stabilization.  Schizoaffective disorder bipolar type the patient will be continued on Haldol 10 mg by mouth twice a day. I will contact [facility]  acting to see patient is is still taking Haldol Decanoate 250 mg every month. Continue Depakote 1500 mg daily at bedtime.  EPS and tardive dyskinesia continue amantadine 100 mg by mouth twice a day  Insomnia continue clonazepam 1 mg by mouth daily at bedtime  History of hyperammonemia secondary to treatment with  Depakote continue lactulose daily  Tobacco use disorder patient will started on a nicotine patch 21 mg a day.  Precautions every 15 minute checks  Diet regular  Vital signs daily  Hospitalization and status continue involuntary commitment  Labs I will order hemoglobin A1c, lipid panel and prolactin level. I will also check a valproic acid acid, TSH and ammonia  [facility]  Dispo patient will return to his apartment once stable   I certify that inpatient services furnished can reasonably be expected to improve the patient's condition.    Jimmy FootmanHernandez-Gonzalez,  Barnard Sharps, MD 7/6/20175:50 PM

## 2016-06-29 NOTE — Plan of Care (Signed)
Problem: Education: Goal: Mental status will improve Outcome: Not Progressing Patient is euphoric and intrusive at this time .[place] RN

## 2016-06-30 NOTE — [facility] Group Notes (Signed)
[facility] [name] Group Therapy   06/30/2016  9:30 AM  Type of Therapy: Group Therapy   Participation Level: Did Not Attend. Patient invited to participate but declined.    Tylea Hise F. Yuridiana Formanek, MSW, LCSWA, LCAS     

## 2016-06-30 NOTE — Progress Notes (Signed)
Patient with sad affect, cooperative with meals and meds. Irritable and withdrawn in bed this am. Minimal interaction with peers. Sleepy in bed, safety maintained.

## 2016-06-30 NOTE — Plan of Care (Signed)
Problem: Coping: Goal: Ability to verbalize frustrations and anger appropriately will improve Outcome: Not Progressing Patient remains withdrawn in room, increasingly anxious and agitated when discussing discharge plan or placing phone calls. Ativan prn for anxiety with fair effect.

## 2016-06-30 NOTE — Progress Notes (Signed)
[facility] MD Progress Note  06/30/2016 10:47 AM Peter Wilson  MRN:  [id number] Subjective:  Patient with schizoaffective disorder bipolar type and antisocial traits. Admitted after his ACT team petitioned him for noncompliance with medications, aggression to his neighbors and burning himself with cigarettes.  Patient is well-known to our unit as he has been hospitalized a multitude of times. His last admission was in January of this year.  Patient currently prescribed with Haldol Decanoate 250 mg every month, Depakote ER 1500 mg daily at bedtime, Haldol oral 10 mg by mouth twice a day and amantadine 100 mg by mouth twice a day.  During prior admissions the patient was staying at group homes; now tells me he is living in an apartment by himself  Patient was lying in bed this morning. He has been staying in his room a lot. He is usually sleeping late. Yesterday afternoon agitated, slamming doors, coursing loudly while on the phone whit someone (found out he was been evicted). There were no events of aggression or agitation overnight. Mood/affect continues to be irritable. He  slept well through the night and comply with all his medications.  Patient continues to deny having any aggression. He denies suicidality, homicidality or having auditory or visual hallucinations.    Patient reports that the dose of Depakote was changed by his outpatient psychiatrist from 2000 to . He tells me that back in February he was hospitalized on the medical floor for side effects from Depakote. The dose of Depakote was decreased 1500.  Soon after that his outpatient psychiatrist decreased the dose to 1000 mg.  Per records he was hospitalized in February for mild Depakote toxicity and hyperammonemia.  Social worker tells me today that due to his high level of aggression and his ACT  is no longer willing to work with him. He has communicated threats against them. Also he has been communicating threats to other people in the  apartment complex and his landlord will be evicting him.  His ACT and his care coordinator had requested for [place] to make a referral to [facility] for long-[facility] hospitalization as this patient is frequently aggressive, noncompliant and has had multiple hospitalizations due to these issues.  Per nursing: Patient with sad affect, cooperative with meals and meds. Irritable and withdrawn in bed this am. Minimal interaction with peers. Sleepy in bed, safety maintained.   Principal Problem: Schizoaffective disorder (HCC) Diagnosis:   Patient Active Problem List   Diagnosis Date Noted  . Schizoaffective disorder, bipolar type (HCC) [F25.0] 06/27/2016  . Schizoaffective disorder (HCC) [F25.9] 06/27/2016  . Hyperammonemia (HCC) [E72.20] 02/17/2016  . Tardive dyskinesia [G24.01] 09/15/2015  . Antisocial traits [F60.2] 09/15/2015  . Tobacco use disorder [F17.200] 09/14/2015  . Cannabis use disorder, moderate, dependence (HCC) [F12.20] 09/14/2015   Total Time spent with patient: 30 minutes  Past Psychiatric History: Schizoaffective disorder bipolar type and antisocial traits  Past Medical History:  Past Medical History  Diagnosis Date  . Schizophrenia, paranoid (HCC)   . Tardive dyskinesia   . Tobacco abuse   . Marijuana abuse     Past Surgical History  Procedure Laterality Date  . No past surgeries     Family History: History reviewed. No pertinent family history.   Family Psychiatric  History: Unknown  Social History:  History  Alcohol Use No     History  Drug Use  . Yes  . Special: Marijuana    Social History   Social History  . Marital Status: Single  Spouse Name: N/A  . Number of Children: N/A  . Years of Education: N/A   Social History Main Topics  . Smoking status: Current Every Day Smoker -- 2.00 packs/day    Types: Cigarettes  . Smokeless tobacco: None  . Alcohol Use: No  . Drug Use: Yes    Special: Marijuana  . Sexual Activity: Not Asked    Other Topics Concern  . None   Social History Narrative   Additional Social History:    History of alcohol / drug use?: Yes Name of Substance 1: Marijuana 1 - Age of First Use: 13 1 - Amount (size/oz): 6 1 - Frequency: daily 1 - Duration: years      Current Medications: Current Facility-Administered Medications  Medication Dose Route Frequency Provider Last Rate Last Dose  . acetaminophen (TYLENOL) tablet 650 mg  650 mg Oral Q6H PRN Jimmy FootmanAndrea Hernandez-Gonzalez, MD   650 mg at 06/29/16 2115  . albuterol (PROVENTIL) (2.5 MG/3ML) 0.083% nebulizer solution 3 mL  3 mL Inhalation Q4H PRN Jimmy FootmanAndrea Hernandez-Gonzalez, MD      . alum & mag hydroxide-simeth (MAALOX/MYLANTA) 200-200-20 MG/5ML suspension 30 mL  30 mL Oral Q4H PRN Jimmy FootmanAndrea Hernandez-Gonzalez, MD      . amantadine (SYMMETREL) capsule 100 mg  100 mg Oral BID Jimmy FootmanAndrea Hernandez-Gonzalez, MD   100 mg at 06/30/16 0843  . clonazePAM (KLONOPIN) tablet 1 mg  1 mg Oral QHS Jimmy FootmanAndrea Hernandez-Gonzalez, MD   1 mg at 06/29/16 2114  . divalproex (DEPAKOTE ER) 24 hr tablet 1,000 mg  1,000 mg Oral QHS Jimmy FootmanAndrea Hernandez-Gonzalez, MD   1,000 mg at 06/29/16 2114  . LORazepam (ATIVAN) tablet 2 mg  2 mg Oral Q4H PRN Jimmy FootmanAndrea Hernandez-Gonzalez, MD   2 mg at 06/29/16 1251   Or  . LORazepam (ATIVAN) injection 2 mg  2 mg Intramuscular Q4H PRN Jimmy FootmanAndrea Hernandez-Gonzalez, MD      . magnesium hydroxide (MILK OF MAGNESIA) suspension 30 mL  30 mL Oral Daily PRN Jimmy FootmanAndrea Hernandez-Gonzalez, MD      . nicotine (NICODERM CQ - dosed in mg/24 hours) patch 21 mg  21 mg Transdermal Q0600 Jimmy FootmanAndrea Hernandez-Gonzalez, MD   21 mg at 06/28/16 [id number]  . OLANZapine (ZYPREXA) injection 10 mg  10 mg Intramuscular BID PRN Jimmy FootmanAndrea Hernandez-Gonzalez, MD   10 mg at 06/29/16 1249  . OLANZapine (ZYPREXA) tablet 30 mg  30 mg Oral QHS Jimmy FootmanAndrea Hernandez-Gonzalez, MD   30 mg at 06/29/16 2113    Lab Results:  No results found for this or any previous visit (from the past 48 hour(s)).  Blood  Alcohol level:  Lab Results  Component Value Date   [facility] <5 06/26/2016   ETH <5 01/24/2016    Metabolic Disorder Labs: Lab Results  Component Value Date   HGBA1C 5.0 01/27/2016   Lab Results  Component Value Date   PROLACTIN 40.2* 01/27/2016   Lab Results  Component Value Date   CHOL 150 01/27/2016   TRIG 83 01/27/2016   HDL 44 01/27/2016   CHOLHDL 3.4 01/27/2016   VLDL 17 01/27/2016   LDLCALC 89 01/27/2016   LDLCALC 117* 09/15/2015    Physical Findings: AIMS: Facial and Oral Movements Muscles of Facial Expression: None, normal Lips and Perioral Area: None, normal Jaw: None, normal Tongue: None, normal,Extremity Movements Upper (arms, wrists, hands, fingers): None, normal Lower (legs, knees, ankles, toes): None, normal, Trunk Movements Neck, shoulders, hips: None, normal, Overall Severity Severity of abnormal movements (highest score from questions above): None, normal Incapacitation due  to abnormal movements: None, normal Patient's awareness of abnormal movements (rate only patient's report): No Awareness, Dental Status Current problems with teeth and/or dentures?: No Does patient usually wear dentures?: No  CIWA:    COWS:     Musculoskeletal: Strength & Muscle Tone: within normal limits Gait & Station: normal Patient leans: N/A  Psychiatric Specialty Exam: Physical Exam  Constitutional: He is oriented to person, place, and time. He appears well-developed and well-nourished.  HENT:  Head: Normocephalic and atraumatic.  Eyes: EOM are normal.  Neck: Normal range of motion.  Respiratory: Effort normal.  Musculoskeletal: Normal range of motion.  Neurological: He is alert and oriented to person, place, and time.    Review of Systems  Constitutional: Negative.   HENT: Negative.   Eyes: Negative.   Respiratory: Negative.   Cardiovascular: Negative.   Gastrointestinal: Negative.   Genitourinary: Negative.   Musculoskeletal: Negative.   Skin: Negative.    Neurological: Negative.   Endo/Heme/Allergies: Negative.   Psychiatric/Behavioral: Negative.     Blood pressure 105/67, pulse 77, temperature 98 F (36.7 C), temperature source Oral, resp. rate 18, height 6' (1.829 m), weight 79.833 kg (176 lb), SpO2 100 %.Body mass index is 23.86 kg/(m^2).  General Appearance: Fairly Groomed  Eye Contact:  Minimal  Speech:  Clear and Coherent  Volume:  Normal  Mood:  Irritable  Affect:  Constricted  Thought Process:  Linear and Descriptions of Associations: Intact  Orientation:  Full (Time, Place, and Person)  Thought Content:  Paranoid Ideation  Suicidal Thoughts:  No  Homicidal Thoughts:  No  Memory:  Immediate;   Fair Recent;   Fair Remote;   Fair  Judgement:  Poor  Insight:  Shallow  Psychomotor Activity:  Decreased  Concentration:  Concentration: Fair and Attention Span: Fair  Recall:  FiservFair  Fund of Knowledge:  Fair  Language:  Good  Akathisia:  No  Handed:    AIMS (if indicated):     Assets:  Financial Resources/Insurance Housing Social Support  ADL's:  Intact  Cognition:  WNL  Sleep:  Number of Hours: 5.5     Treatment Plan Summary:  Schizoaffective disorder bipolar type: The patient has been started on olanzapine.  He will be continued on Haldol Decanoate 250  q 30 days ---last given on 6/27  Started on Olanzapine which has been increased to 30 mg qhs  Continue depakote ER  1000 mg qhs (higher dose causes hyperammonemia)  I have d/c Haldol oral 10 mg by mouth twice a day as he has started dyskinesia and significant tremors.  EPS and tardive dyskinesia continue amantadine 100 mg by mouth twice a day  Insomnia continue clonazepam  0.5 mg qhs  History of hyperammonemia secondary to treatment with Depakote. Pt says depakote dose has been reduced to 1000 mg qhs.  Refuses to take lactulose  Tobacco use disorder patient will started on a nicotine patch 21 mg a day.  Precautions every 15 minute checks  Diet  regular  Vital signs daily  Hospitalization and status continue involuntary commitment  Labs: Random Depakote level checked yesterday was 43--- likely indicating noncompliance.  Collateral information will be obtained from [facility]---They will not continue working with this patient as he has communicated threats against them. Social worker has made referrals to other act teams.  [facility] declines taking over the case.  Pending answer from [facility].  Dispo patient will return to his apartment once stable--- social worker reported to me pt will be evicted in 30days  Social worker will make a referral to [facility] today as requested by his act team and by his care coordinator. They report they have been unable to maintain his stability for very long. Pt aggressive when he stops taking medications.  Jimmy Footman, MD 06/30/2016, 10:47 AM

## 2016-06-30 NOTE — Plan of Care (Signed)
Problem: Role Relationship: Goal: Ability to interact with others will improve Outcome: Progressing Patient has been interacting appropriate in the milieu
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
## 2016-06-30 NOTE — Progress Notes (Addendum)
CSW spoke to the pt and requested permission to find the pt housing once the pt is discharged, due to the pt being informed that he was given a 30 day notice to vacate his apartment on 06/29/16, within 30 days of 06/28/16.  Pt refused stating he is "going to his home in [place]".  Pt was vague when questioned where this was and whose home he planned to move into and could not answer the CSW's question.  Pt refused to allow the CSW to locate housing for the pt.  Pt does not have a legal guardian. Patient with increasing anxiety and agitation rt phone calls rt discharge. Ativan po prn administered. Safety maintained.

## 2016-06-30 NOTE — [facility] Group Notes (Signed)
[facility] Group Notes:  (Nursing/MHT/Case Management/Adjunct)  Date:  06/30/2016  Time:  2:31 AM  Type of Therapy:  Psychoeducational Skills  Participation Level:  Minimal  Participation Quality:  Monopolizing  Affect:  Labile  Cognitive:  Delusional and Lacking  Insight:  Lacking and Limited  Engagement in Group:  Distracting, Lacking, Limited, Off Topic and Poor  Modes of Intervention:  Discussion and Exploration  Summary of Progress/Problems:  Peter Wilson 06/30/2016, 2:31 AM
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
## 2016-06-30 NOTE — [facility] Group Notes (Signed)
[facility] Group Notes:  (Nursing/MHT/Case Management/Adjunct)  Date:  06/30/2016  Time:  4:03 PM  Type of Therapy:  Psychoeducational Skills  Participation Level:  Did Not Attend  Twanna Hymanda C Dontrae Morini 06/30/2016, 4:03 PM

## 2016-07-01 NOTE — [facility] Group Notes (Signed)
[facility] Group Notes:  (Nursing/MHT/Case Management/Adjunct)  Date:  07/01/2016  Time:  12:53 AM  Type of Therapy:  Evening Wrap-up Group  Participation Level:  Did Not Attend  Participation Quality:  N/A  Affect:  N/A  Cognitive:  N/A  Insight:  None  Engagement in Group:  N/A  Modes of Intervention:  Discussion  Summary of Progress/Problems:  Tomasita MorrowChelsea Nanta Floride Hutmacher 07/01/2016, 12:53 AM

## 2016-07-01 NOTE — [facility] Group Notes (Signed)
[facility] [name] Group Therapy  07/01/2016 2:09 PM  Type of Therapy:  Group Therapy  Participation Level:  Did Not Attend  Modes of Intervention:  Discussion, Education, Socialization and Support  Summary of Progress/Problems: Balance in life: Patients will discuss the concept of balance and how it looks and feels to be unbalanced. Pt will identify areas in their life that is unbalanced and ways to become more balanced.    Shary Lamos L Rodriques Badie  MSW, LCSWA  07/01/2016, 2:09 PM  

## 2016-07-01 NOTE — Progress Notes (Signed)
Pt has been pleasant and cooperative. Pt denies SI and A/V hallucinations.Some  inappropriate behaviors noted. Pt has needed some redirecting. Pt has not  attended any unit activities. Will continue to observe and maintain a safe environment.

## 2016-07-01 NOTE — Progress Notes (Addendum)
[facility]  07/01/2016 5:50 AM Peter Wilson  MRN:  [id number]  Subjective:  [name] has no complaints today. He denies any symptoms of depression anxiety or psychosis. He does not participate in groups. He accepts medications and tolerates them well. Sleep and appetite are fair. There are no somatic complaints.  Principal Problem: Schizoaffective disorder (HCC) Diagnosis:   Patient Active Problem List   Diagnosis Date Noted  . Schizoaffective disorder, bipolar type (HCC) [F25.0] 06/27/2016  . Schizoaffective disorder (HCC) [F25.9] 06/27/2016  . Hyperammonemia (HCC) [E72.20] 02/17/2016  . Tardive dyskinesia [G24.01] 09/15/2015  . Antisocial traits [F60.2] 09/15/2015  . Tobacco use disorder [F17.200] 09/14/2015  . Cannabis use disorder, moderate, dependence (HCC) [F12.20] 09/14/2015   Total Time spent with patient: 20 minutes  Past Psychiatric History: Schizophrenia.  Past Medical History:  Past Medical History  Diagnosis Date  . Schizophrenia, paranoid (HCC)   . Tardive dyskinesia   . Tobacco abuse   . Marijuana abuse     Past Surgical History  Procedure Laterality Date  . No past surgeries     Family History: History reviewed. No pertinent family history. Family Psychiatric  History: See H&P. Social History:  History  Alcohol Use No     History  Drug Use  . Yes  . Special: Marijuana    Social History   Social History  . Marital Status: Single    Spouse Name: N/A  . Number of Children: N/A  . Years of Education: N/A   Social History Main Topics  . Smoking status: Current Every Day Smoker -- 2.00 packs/day    Types: Cigarettes  . Smokeless tobacco: None  . Alcohol Use: No  . Drug Use: Yes    Special: Marijuana  . Sexual Activity: Not Asked   Other Topics Concern  . None   Social History Narrative   Additional Social History:    History of alcohol / drug use?: Yes Name of Substance 1: Marijuana 1 - Age of First Use: 13 1 - Amount  (size/oz): 6 1 - Frequency: daily 1 - Duration: years                  Sleep: Fair  Appetite:  Fair  Current Medications: Current Facility-Administered Medications  Medication Dose Route Frequency Provider Last Rate Last Dose  . acetaminophen (TYLENOL) tablet 650 mg  650 mg Oral Q6H PRN Jimmy FootmanAndrea Hernandez-Gonzalez, MD   650 mg at 06/30/16 2201  . albuterol (PROVENTIL) (2.5 MG/3ML) 0.083% nebulizer solution 3 mL  3 mL Inhalation Q4H PRN Jimmy FootmanAndrea Hernandez-Gonzalez, MD      . alum & mag hydroxide-simeth (MAALOX/MYLANTA) 200-200-20 MG/5ML suspension 30 mL  30 mL Oral Q4H PRN Jimmy FootmanAndrea Hernandez-Gonzalez, MD      . amantadine (SYMMETREL) capsule 100 mg  100 mg Oral BID Jimmy FootmanAndrea Hernandez-Gonzalez, MD   100 mg at 06/30/16 2201  . clonazePAM (KLONOPIN) tablet 1 mg  1 mg Oral QHS Jimmy FootmanAndrea Hernandez-Gonzalez, MD   1 mg at 06/30/16 2201  . divalproex (DEPAKOTE ER) 24 hr tablet 1,000 mg  1,000 mg Oral QHS Jimmy FootmanAndrea Hernandez-Gonzalez, MD   1,000 mg at 06/30/16 2201  . LORazepam (ATIVAN) tablet 2 mg  2 mg Oral Q4H PRN Jimmy FootmanAndrea Hernandez-Gonzalez, MD   2 mg at 06/30/16 1217   Or  . LORazepam (ATIVAN) injection 2 mg  2 mg Intramuscular Q4H PRN Jimmy FootmanAndrea Hernandez-Gonzalez, MD      . magnesium hydroxide (MILK OF MAGNESIA) suspension 30 mL  30 mL Oral  Daily PRN Jimmy Footman, MD      . nicotine (NICODERM CQ - dosed in mg/24 hours) patch 21 mg  21 mg Transdermal Q0600 Jimmy Footman, MD   21 mg at 06/28/16 4098  . OLANZapine (ZYPREXA) injection 10 mg  10 mg Intramuscular BID PRN Jimmy Footman, MD   10 mg at 06/29/16 1249  . OLANZapine (ZYPREXA) tablet 30 mg  30 mg Oral QHS Jimmy Footman, MD   30 mg at 06/30/16 2201    Lab Results: No results found for this or any previous visit (from the past 48 hour(s)).  Blood Alcohol level:  Lab Results  Component Value Date   [facility] <5 06/26/2016   ETH <5 01/24/2016    Metabolic Disorder Labs: Lab Results  Component Value  Date   HGBA1C 5.0 01/27/2016   Lab Results  Component Value Date   PROLACTIN 40.2* 01/27/2016   Lab Results  Component Value Date   CHOL 150 01/27/2016   TRIG 83 01/27/2016   HDL 44 01/27/2016   CHOLHDL 3.4 01/27/2016   VLDL 17 01/27/2016   LDLCALC 89 01/27/2016   LDLCALC 117* 09/15/2015    Physical Findings: AIMS: Facial and Oral Movements Muscles of Facial Expression: None, normal Lips and Perioral Area: None, normal Jaw: None, normal Tongue: None, normal,Extremity Movements Upper (arms, wrists, hands, fingers): None, normal Lower (legs, knees, ankles, toes): None, normal, Trunk Movements Neck, shoulders, hips: None, normal, Overall Severity Severity of abnormal movements (highest score from questions above): None, normal Incapacitation due to abnormal movements: None, normal Patient's awareness of abnormal movements (rate only patient's report): No Awareness, Dental Status Current problems with teeth and/or dentures?: No Does patient usually wear dentures?: No  CIWA:    COWS:     Musculoskeletal: Strength & Muscle Tone: within normal limits Gait & Station: normal Patient leans: N/A  Psychiatric Specialty Exam: Physical Exam  Nursing note and vitals reviewed.   Review of Systems  Psychiatric/Behavioral: Positive for hallucinations.  All other systems reviewed and are negative.   Blood pressure 105/67, pulse 77, temperature 98 F (36.7 C), temperature source Oral, resp. rate 18, height 6' (1.829 m), weight 79.833 kg (176 lb), SpO2 100 %.Body mass index is 23.86 kg/(m^2).  General Appearance: Casual  Eye Contact:  Fair  Speech:  Clear and Coherent  Volume:  Normal  Mood:  Anxious  Affect:  Appropriate  Thought Process:  Disorganized  Orientation:  Full (Time, Place, and Person)  Thought Content:  Delusions and Paranoid Ideation  Suicidal Thoughts:  No  Homicidal Thoughts:  No  Memory:  Immediate;   Fair Recent;   Fair Remote;   Fair  Judgement:  Poor   Insight:  Lacking  Psychomotor Activity:  Normal  Concentration:  Concentration: Fair and Attention Span: Fair  Recall:  [employer] of Knowledge:  Fair  Language:  Fair  Akathisia:  No  Handed:  Right  AIMS (if indicated):     Assets:  Communication Skills Desire for Improvement Physical Health Resilience  ADL's:  Intact  Cognition:  WNL  Sleep:  Number of Hours: 5.5     Treatment Plan Summary: Daily contact with patient to assess and evaluate symptoms and progress in treatment and Medication management   Schizoaffective disorder bipolar type: The patient has been started on olanzapine. He will be continued on Haldol Decanoate 250 q 30 days ---last given on 6/27  Started on Olanzapine which has been increased to 30 mg qhs  Continue depakote ER  1000 mg qhs (higher dose causes hyperammonemia)  I have d/c Haldol oral 10 mg by mouth twice a day as he has started dyskinesia and significant tremors.  EPS and tardive dyskinesia continue amantadine 100 mg by mouth twice a day  Insomnia continue clonazepam 0.5 mg qhs  History of hyperammonemia secondary to treatment with Depakote. Pt says depakote dose has been reduced to 1000 mg qhs. Refuses to take lactulose  Tobacco use disorder patient will started on a nicotine patch 21 mg a day.  Precautions every 15 minute checks  Diet regular  Vital signs daily  Hospitalization and status continue involuntary commitment  Labs: Random Depakote level checked yesterday was 43--- likely indicating noncompliance.  Collateral information will be obtained from [facility] will not continue working with this patient as he has communicated threats against them. Social worker has made referrals to other act teams. [facility] declines taking over the case. Pending answer from [employer].  Dispo patient will return to his apartment once stable--- social worker reported to me pt will be evicted in 30days  Social worker will make a  referral to [facility] today as requested by his act team and by his care coordinator. They report they have been unable to maintain his stability for very long. Pt aggressive when he stops taking medications.  7/8 no medication changes were offered.  Kristine Linea, MD 07/01/2016, 5:50 AM

## 2016-07-02 NOTE — Progress Notes (Addendum)
[facility] MD Progress Note  07/02/2016 6:47 AM Peter Wilson  MRN:  [id number]  Subjective:  [name] reports no problems today. He tolerates medications well and is asking if he will be given prescriptions at discharge. He likes the medications he is on. He is out in the community interacting with peers and staff appropriately. There are no somatic complaints. Sleep and appetite are good.  Principal Problem: Schizoaffective disorder (HCC) Diagnosis:   Patient Active Problem List   Diagnosis Date Noted  . Schizoaffective disorder, bipolar type (HCC) [F25.0] 06/27/2016  . Schizoaffective disorder (HCC) [F25.9] 06/27/2016  . Hyperammonemia (HCC) [E72.20] 02/17/2016  . Tardive dyskinesia [G24.01] 09/15/2015  . Antisocial traits [F60.2] 09/15/2015  . Tobacco use disorder [F17.200] 09/14/2015  . Cannabis use disorder, moderate, dependence (HCC) [F12.20] 09/14/2015   Total Time spent with patient: 20 minutes  Past Psychiatric History: Schizoaffective disorder.  Past Medical History:  Past Medical History  Diagnosis Date  . Schizophrenia, paranoid (HCC)   . Tardive dyskinesia   . Tobacco abuse   . Marijuana abuse     Past Surgical History  Procedure Laterality Date  . No past surgeries     Family History: History reviewed. No pertinent family history. Family Psychiatric  History: See H&P. Social History:  History  Alcohol Use No     History  Drug Use  . Yes  . Special: Marijuana    Social History   Social History  . Marital Status: Single    Spouse Name: N/A  . Number of Children: N/A  . Years of Education: N/A   Social History Main Topics  . Smoking status: Current Every Day Smoker -- 2.00 packs/day    Types: Cigarettes  . Smokeless tobacco: None  . Alcohol Use: No  . Drug Use: Yes    Special: Marijuana  . Sexual Activity: Not Asked   Other Topics Concern  . None   Social History Narrative   Additional Social History:    History of alcohol / drug use?:  Yes Name of Substance 1: Marijuana 1 - Age of First Use: 13 1 - Amount (size/oz): 6 1 - Frequency: daily 1 - Duration: years                  Sleep: Fair  Appetite:  Fair  Current Medications: Current Facility-Administered Medications  Medication Dose Route Frequency Provider Last Rate Last Dose  . acetaminophen (TYLENOL) tablet 650 mg  650 mg Oral Q6H PRN Jimmy Footman, MD   650 mg at 07/01/16 1814  . albuterol (PROVENTIL) (2.5 MG/3ML) 0.083% nebulizer solution 3 mL  3 mL Inhalation Q4H PRN Jimmy Footman, MD      . alum & mag hydroxide-simeth (MAALOX/MYLANTA) 200-200-20 MG/5ML suspension 30 mL  30 mL Oral Q4H PRN Jimmy Footman, MD   30 mL at 07/01/16 1937  . amantadine (SYMMETREL) capsule 100 mg  100 mg Oral BID Jimmy Footman, MD   100 mg at 07/01/16 2114  . clonazePAM (KLONOPIN) tablet 1 mg  1 mg Oral QHS Jimmy Footman, MD   1 mg at 07/01/16 2114  . divalproex (DEPAKOTE ER) 24 hr tablet 1,000 mg  1,000 mg Oral QHS Jimmy Footman, MD   1,000 mg at 07/01/16 2114  . LORazepam (ATIVAN) tablet 2 mg  2 mg Oral Q4H PRN Jimmy Footman, MD   2 mg at 06/30/16 1217   Or  . LORazepam (ATIVAN) injection 2 mg  2 mg Intramuscular Q4H PRN Jimmy Footman, MD      .  magnesium hydroxide (MILK OF MAGNESIA) suspension 30 mL  30 mL Oral Daily PRN Jimmy Footman, MD      . nicotine (NICODERM CQ - dosed in mg/24 hours) patch 21 mg  21 mg Transdermal Q0600 Jimmy Footman, MD   21 mg at 06/28/16 4098  . OLANZapine (ZYPREXA) injection 10 mg  10 mg Intramuscular BID PRN Jimmy Footman, MD   10 mg at 06/29/16 1249  . OLANZapine (ZYPREXA) tablet 30 mg  30 mg Oral QHS Jimmy Footman, MD   30 mg at 07/01/16 2114    Lab Results: No results found for this or any previous visit (from the past 48 hour(s)).  Blood Alcohol level:  Lab Results  Component Value Date   [facility] <5  06/26/2016   ETH <5 01/24/2016    Metabolic Disorder Labs: Lab Results  Component Value Date   HGBA1C 5.0 01/27/2016   Lab Results  Component Value Date   PROLACTIN 40.2* 01/27/2016   Lab Results  Component Value Date   CHOL 150 01/27/2016   TRIG 83 01/27/2016   HDL 44 01/27/2016   CHOLHDL 3.4 01/27/2016   VLDL 17 01/27/2016   LDLCALC 89 01/27/2016   LDLCALC 117* 09/15/2015    Physical Findings: AIMS: Facial and Oral Movements Muscles of Facial Expression: None, normal Lips and Perioral Area: None, normal Jaw: None, normal Tongue: None, normal,Extremity Movements Upper (arms, wrists, hands, fingers): None, normal Lower (legs, knees, ankles, toes): None, normal, Trunk Movements Neck, shoulders, hips: None, normal, Overall Severity Severity of abnormal movements (highest score from questions above): None, normal Incapacitation due to abnormal movements: None, normal Patient's awareness of abnormal movements (rate only patient's report): No Awareness, Dental Status Current problems with teeth and/or dentures?: No Does patient usually wear dentures?: No  CIWA:    COWS:     Musculoskeletal: Strength & Muscle Tone: within normal limits Gait & Station: normal Patient leans: N/A  Psychiatric Specialty Exam: Physical Exam  Nursing note and vitals reviewed.   Review of Systems  Psychiatric/Behavioral: Positive for hallucinations.  All other systems reviewed and are negative.   Blood pressure 115/81, pulse 71, temperature 98 F (36.7 C), temperature source Oral, resp. rate 18, height 6' (1.829 m), weight 79.833 kg (176 lb), SpO2 100 %.Body mass index is 23.86 kg/(m^2).  General Appearance: Casual  Eye Contact:  Good  Speech:  Clear and Coherent  Volume:  Normal  Mood:  Euphoric  Affect:  Appropriate  Thought Process:  Goal Directed  Orientation:  Full (Time, Place, and Person)  Thought Content:  Delusions and Paranoid Ideation  Suicidal Thoughts:  No  Homicidal  Thoughts:  No  Memory:  Immediate;   Fair Recent;   Fair Remote;   Fair  Judgement:  Poor  Insight:  Lacking  Psychomotor Activity:  Normal  Concentration:  Concentration: Fair and Attention Span: Fair  Recall:  [employer] of Knowledge:  Fair  Language:  Fair  Akathisia:  No  Handed:  Right  AIMS (if indicated):     Assets:  Communication Skills Desire for Improvement Financial Resources/Insurance Physical Health Resilience Social Support  ADL's:  Intact  Cognition:  WNL  Sleep:  Number of Hours: 8     Treatment Plan Summary: Daily contact with patient to assess and evaluate symptoms and progress in treatment and Medication management   Schizoaffective disorder bipolar type: The patient has been started on olanzapine. He will be continued on Haldol Decanoate 250 q 30 days ---last given on  6/27  Started on Olanzapine which has been increased to 30 mg qhs  Continue depakote ER 1000 mg qhs (higher dose causes hyperammonemia)  I have d/c Haldol oral 10 mg by mouth twice a day as he has started dyskinesia and significant tremors.  EPS and tardive dyskinesia continue amantadine 100 mg by mouth twice a day  Insomnia continue clonazepam 0.5 mg qhs  History of hyperammonemia secondary to treatment with Depakote. Pt says depakote dose has been reduced to 1000 mg qhs. Refuses to take lactulose  Tobacco use disorder patient will started on a nicotine patch 21 mg a day.  Precautions every 15 minute checks  Diet regular  Vital signs daily  Hospitalization and status continue involuntary commitment  Labs: Random Depakote level checked yesterday was 43--- likely indicating noncompliance.  Collateral information will be obtained from [facility]---They will not continue working with this patient as he has communicated threats against them. Social worker has made referrals to other act teams. [facility] declines taking over the case. Pending answer from [employer].  Dispo  patient will return to his apartment once stable--- social worker reported to me pt will be evicted in 30days  [facility] as requested by his act team and by his care coordinator. They report they have been unable to maintain his stability for very long. Pt aggressive when he stops taking medications.  7/9 no medication adjustments were offered.  Kristine LineaJolanta Rockey Guarino, MD 07/02/2016, 6:47 AM

## 2016-07-02 NOTE — [facility] Group Notes (Signed)
[facility] Group Notes:  (Nursing/MHT/Case Management/Adjunct)  Date:  07/02/2016  Time:  2:26 AM  Type of Therapy:  Psychoeducational Skills  Participation Level:  Active  Participation Quality:  Attentive  Affect:  Appropriate  Cognitive:  Appropriate  Insight:  Appropriate  Engagement in Group:  Engaged  Modes of Intervention:  Discussion, Socialization and Support  Summary of Progress/Problems:  Chancy MilroyLaquanda Y Sherill Wegener 07/02/2016, 2:26 AM
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
## 2016-07-02 NOTE — Progress Notes (Signed)
Pt has been pleasant and cooperative. Pt denies SI and A/V hallucinations although he does appear to be responding to internal stimuli at times. No inappropriate behaviors noted. Pt has been less seclusive to his room . Pt is able to contract for safety. Will continue to observe and maintain a safe environment.

## 2016-07-02 NOTE — [facility] Group Notes (Signed)
[facility] [name] Group Therapy  07/02/2016 2:04 PM  Type of Therapy:  Group Therapy  Participation Level:  Did Not Attend  Modes of Intervention:  Discussion, Education, Socialization and Support  Summary of Progress/Problems: Pt will identify unhealthy thoughts and how they impact their emotions and behavior. Pt will be encouraged to discuss these thoughts, emotions and behaviors with the group.   Kaisy Severino L Taytum Wheller MSW, LCSWA  07/02/2016, 2:04 PM  

## 2016-07-03 NOTE — Progress Notes (Signed)
Patient anxious this shift, angry wanting to be discharge home. Pt using profanity and pacing halls. Prn given with partial relief. Encouraged pt to relax and attend group. Pt continues to pace and blame social worker for not being discharged.  Pt denies SI, HI, AVH. Encouragement and support offered. Pt receptive and remains safe on unit with q 15 min checks.
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
## 2016-07-03 NOTE — [facility] Group Notes (Signed)
[facility] Group Notes:  (Nursing/MHT/Case Management/Adjunct)  Date:  07/03/2016  Time:  9:26 PM  Type of Therapy:  Group Therapy  Participation Level:  Active  Participation Quality:  Appropriate  Affect:  Appropriate  Cognitive:  Appropriate  Insight:  Appropriate  Engagement in Group:  Engaged  Modes of Intervention:  Discussion  Summary of Progress/Problems:  Burt EkJanice Marie Letisha Yera 07/03/2016, 9:26 PM

## 2016-07-03 NOTE — Progress Notes (Addendum)
[facility] MD Progress Note  07/03/2016 11:42 AM Peter Wilson  MRN:  [id number]  Subjective:  Patient had a verbal agitation with a peer last night. The patient was redirected by nurses say he went back to his room. Nurses state that he was provoke by the peer.  He has been compliant with olanzapine and Depakote. He is denying having major side effects.  He has very limited insight into his behaviors and his tendencies to threaten and frequent verbal aggression aggression.  He is aware now that he will be evicted and that [employer] on is not longer willing to work with him due to these aggressive behaviors. He says he wants to be discharged back to his apartment and then will return to his home town which is [facility].  In prior admissions patient frequently comes out with this plan of going back to his family in [place]. His family has been always been opposed of having him move back with them.  Social worker and I pointed this out to the patient ending he is stated he plans to go to some other relatives in [place] but is unable to provide [place] with any contact information.  It is difficult to formulate a realistic discharge plan for Mr. Dufault. He stated that he will need at least to be discharged with an act team in place.  He is at this time his own guardian and if he declines will fortunately 1 be able to do much to prevent decompensation in the future.   Principal Problem: Schizoaffective disorder (HCC) Diagnosis:   Patient Active Problem List   Diagnosis Date Noted  . Schizoaffective disorder, bipolar type (HCC) [F25.0] 06/27/2016  . Schizoaffective disorder (HCC) [F25.9] 06/27/2016  . Hyperammonemia (HCC) [E72.20] 02/17/2016  . Tardive dyskinesia [G24.01] 09/15/2015  . Antisocial traits [F60.2] 09/15/2015  . Tobacco use disorder [F17.200] 09/14/2015  . Cannabis use disorder, moderate, dependence (HCC) [F12.20] 09/14/2015   Total Time spent with patient: 30  minutes  Past Psychiatric History: Schizoaffective disorder.  Past Medical History:  Past Medical History  Diagnosis Date  . Schizophrenia, paranoid (HCC)   . Tardive dyskinesia   . Tobacco abuse   . Marijuana abuse     Past Surgical History  Procedure Laterality Date  . No past surgeries     Family History: History reviewed. No pertinent family history.  Family Psychiatric  History: See H&P.  Social History:  History  Alcohol Use No     History  Drug Use  . Yes  . Special: Marijuana    Social History   Social History  . Marital Status: Single    Spouse Name: N/A  . Number of Children: N/A  . Years of Education: N/A   Social History Main Topics  . Smoking status: Current Every Day Smoker -- 2.00 packs/day    Types: Cigarettes  . Smokeless tobacco: None  . Alcohol Use: No  . Drug Use: Yes    Special: Marijuana  . Sexual Activity: Not Asked   Other Topics Concern  . None   Social History Narrative   Additional Social History:    History of alcohol / drug use?: Yes Name of Substance 1: Marijuana 1 - Age of First Use: 13 1 - Amount (size/oz): 6 1 - Frequency: daily 1 - Duration: years      Current Medications: Current Facility-Administered Medications  Medication Dose Route Frequency Provider Last Rate Last Dose  . acetaminophen (TYLENOL) tablet 650 mg  650 mg  Oral Q6H PRN Jimmy Footman, MD   650 mg at 07/03/16 0728  . albuterol (PROVENTIL) (2.5 MG/3ML) 0.083% nebulizer solution 3 mL  3 mL Inhalation Q4H PRN Jimmy Footman, MD      . alum & mag hydroxide-simeth (MAALOX/MYLANTA) 200-200-20 MG/5ML suspension 30 mL  30 mL Oral Q4H PRN Jimmy Footman, MD   30 mL at 07/01/16 1937  . amantadine (SYMMETREL) capsule 100 mg  100 mg Oral BID Jimmy Footman, MD   100 mg at 07/03/16 0853  . clonazePAM (KLONOPIN) tablet 1 mg  1 mg Oral QHS Jimmy Footman, MD   1 mg at 07/02/16 2037  . divalproex (DEPAKOTE ER)  24 hr tablet 1,000 mg  1,000 mg Oral QHS Jimmy Footman, MD   1,000 mg at 07/02/16 2036  . LORazepam (ATIVAN) tablet 2 mg  2 mg Oral Q4H PRN Jimmy Footman, MD   2 mg at 07/02/16 2037   Or  . LORazepam (ATIVAN) injection 2 mg  2 mg Intramuscular Q4H PRN Jimmy Footman, MD      . magnesium hydroxide (MILK OF MAGNESIA) suspension 30 mL  30 mL Oral Daily PRN Jimmy Footman, MD      . nicotine (NICODERM CQ - dosed in mg/24 hours) patch 21 mg  21 mg Transdermal Q0600 Jimmy Footman, MD   21 mg at 06/28/16 1610  . OLANZapine (ZYPREXA) injection 10 mg  10 mg Intramuscular BID PRN Jimmy Footman, MD   10 mg at 06/29/16 1249  . OLANZapine (ZYPREXA) tablet 30 mg  30 mg Oral QHS Jimmy Footman, MD   30 mg at 07/02/16 2037    Lab Results: No results found for this or any previous visit (from the past 48 hour(s)).  Blood Alcohol level:  Lab Results  Component Value Date   [facility] <5 06/26/2016   ETH <5 01/24/2016    Metabolic Disorder Labs: Lab Results  Component Value Date   HGBA1C 5.0 01/27/2016   Lab Results  Component Value Date   PROLACTIN 40.2* 01/27/2016   Lab Results  Component Value Date   CHOL 150 01/27/2016   TRIG 83 01/27/2016   HDL 44 01/27/2016   CHOLHDL 3.4 01/27/2016   VLDL 17 01/27/2016   LDLCALC 89 01/27/2016   LDLCALC 117* 09/15/2015    Physical Findings: AIMS: Facial and Oral Movements Muscles of Facial Expression: None, normal Lips and Perioral Area: None, normal Jaw: None, normal Tongue: None, normal,Extremity Movements Upper (arms, wrists, hands, fingers): None, normal Lower (legs, knees, ankles, toes): None, normal, Trunk Movements Neck, shoulders, hips: None, normal, Overall Severity Severity of abnormal movements (highest score from questions above): None, normal Incapacitation due to abnormal movements: None, normal Patient's awareness of abnormal movements (rate only patient's  report): No Awareness, Dental Status Current problems with teeth and/or dentures?: No Does patient usually wear dentures?: No  CIWA:    COWS:     Musculoskeletal: Strength & Muscle Tone: within normal limits Gait & Station: normal Patient leans: N/A  Psychiatric Specialty Exam: Physical Exam  Nursing note and vitals reviewed. Constitutional: He is oriented to person, place, and time. He appears well-developed and well-nourished.  HENT:  Head: Normocephalic and atraumatic.  Eyes: EOM are normal.  Neck: Normal range of motion.  Respiratory: Effort normal.  Musculoskeletal: Normal range of motion.  Neurological: He is alert and oriented to person, place, and time.    Review of Systems  Constitutional: Negative.   HENT: Negative.   Eyes: Negative.   Respiratory: Negative.  Cardiovascular: Negative.   Gastrointestinal: Negative.   Genitourinary: Negative.   Musculoskeletal: Negative.   Skin: Negative.   Neurological: Negative.   Endo/Heme/Allergies: Negative.   Psychiatric/Behavioral: Positive for hallucinations.    Blood pressure 117/72, pulse 78, temperature 97.9 F (36.6 C), temperature source Oral, resp. rate 18, height 6' (1.829 m), weight 79.833 kg (176 lb), SpO2 100 %.Body mass index is 23.86 kg/(m^2).  General Appearance: Casual  Eye Contact:  Good  Speech:  Clear and Coherent  Volume:  Normal  Mood:  Euphoric  Affect:  Appropriate  Thought Process:  Goal Directed  Orientation:  Full (Time, Place, and Person)  Thought Content:  Delusions and Paranoid Ideation  Suicidal Thoughts:  No  Homicidal Thoughts:  No  Memory:  Immediate;   Fair Recent;   Fair Remote;   Fair  Judgement:  Poor  Insight:  Lacking  Psychomotor Activity:  Normal  Concentration:  Concentration: Fair and Attention Span: Fair  Recall:  [employer]  Fund of Knowledge:  Fair  Language:  Fair  Akathisia:  No  Handed:  Right  AIMS (if indicated):     Assets:  Communication Skills Desire for  Improvement Financial Resources/Insurance Physical Health Resilience Social Support  ADL's:  Intact  Cognition:  WNL  Sleep:  Number of Hours: 8.45     Treatment Plan Summary: Daily contact with patient to assess and evaluate symptoms and progress in treatment and Medication management   Schizoaffective disorder bipolar type: The patient has been started on olanzapine. He will be continued on Haldol Decanoate 250 q 30 days ---last given on 6/27  Started on Olanzapine which has been increased to 30 mg qhs--- responding well to olanzapine  Continue depakote ER 1000 mg qhs (higher dose causes hyperammonemia).  We will check a Depakote level prior to discharge  I have d/c Haldol oral 10 mg by mouth twice a day as he has started dyskinesia and significant tremors.  EPS and tardive dyskinesia continue amantadine 100 mg by mouth twice a day  Insomnia I plan to discontinue clonazepam as he won't be discharged on this medication.  History of hyperammonemia secondary to treatment with Depakote. Pt says depakote dose has been reduced to 1000 mg qhs. Refuses to take lactulose  Tobacco use disorder patient will started on a nicotine patch 21 mg a day.  Precautions every 15 minute checks  Diet regular  Vital signs daily  Hospitalization and status continue involuntary commitment  Labs: Random Depakote level checked yesterday was 43--- likely indicating noncompliance.  Collateral information will be obtained from [facility]---They will not continue working with this patient as he has communicated threats against them. Social worker has made referrals to other act teams. [facility] declines taking over the case. Pending answer from [employer].  Dispo patient will return to his apartment once stable--- social worker reported to me pt will be evicted in 30days  [facility] as requested by his act team and by his care coordinator.  They report they have been unable to maintain his stability for very long. Pt aggressive when he stops taking medications.  Patient has provided as with the telephone numbers for some of his family members and [place]. Social worker plans to contact them and see if this is viable discharge option.  Jimmy FootmanHernandez-Gonzalez,  Swayzie Choate, MD 07/03/2016, 11:42 AM

## 2016-07-03 NOTE — Plan of Care (Signed)
Problem: Education: Goal: Emotional status will improve Outcome: Not Progressing Pt irritable today

## 2016-07-03 NOTE — Progress Notes (Signed)
Patient with increasing anxiety and agitation after speaking with SW rt patient not discharging today. Writer with different patient. Patient slam his room door hard. Patient pacing and cursing.  Development worker, international aidAssistant nurse director with one on one with patient with good  effect. Writer with one to one with fair effect. Prn Ativan po administered at this time.

## 2016-07-03 NOTE — [facility] Group Notes (Signed)
[facility] [name] Group Therapy   07/03/2016  9:30 AM  Type of Therapy: Group Therapy   Participation Level: Did Not Attend. Patient invited to participate but declined.    Naleah Kofoed F. Daphna Lafuente, MSW, LCSWA, LCAS     

## 2016-07-03 NOTE — [facility] Group Notes (Signed)
[facility] Group Notes:  (Nursing/MHT/Case Management/Adjunct)  Date:  07/03/2016  Time:  5:41 PM  Type of Therapy:  Psychoeducational Skills  Participation Level:  None  Peter Wilson 07/03/2016, 5:41 PM

## 2016-07-03 NOTE — [facility] Group Notes (Signed)
[facility] [name] Group Therapy   07/03/2016 1pm Type of Therapy: Group Therapy   Participation Level: Limited   Participation Quality: Attentive, Sharing and Supportive   Affect: Depressed and Flat   Cognitive: Alert and Oriented   Insight: Developing/Improving and Engaged   Engagement in Therapy: Developing/Improving and Engaged   Modes of Intervention: Clarification, Confrontation, Discussion, Education, Exploration,  Limit-setting, Orientation, Problem-solving, Rapport Building, Dance movement psychotherapisteality Testing, Socialization and Support   Summary of Progress/Problems: Pt was to identify obstacles faced currently and processed barriers involved in overcoming these obstacles. Pt was to identify steps necessary for overcoming these obstacles and explored motivation (internal and external) for facing these difficulties head on. Pt was to further identify one area of concern in their lives and chose a goal to focus on for today. Pt began group twenty minutes late, listened intently to the sharing of other for approximately five minutes and then left group again, not to return.  Dorothe PeaJonathan F. Eldredge Veldhuizen, LCSWA, LCAS

## 2016-07-04 NOTE — Progress Notes (Signed)
[facility] MD Progress Note  07/04/2016 11:11 AM Jolon Degante  MRN:  [id number]  Subjective:  Patient had a verbal altercation with a peer on Sunday night.  No events of agreesion or verbal outburst since then.  He has been compliant with olanzapine and Depakote. He is denying having major side effects.  He has very limited insight into his behaviors and his tendencies to threaten and frequent verbal aggression aggression.  He is aware now that he will be evicted and that [employer] on is not longer willing to work with him due to these aggressive behaviors. He says he wants to be discharged back to his apartment and then will return to his home town which is [facility].  In prior admissions patient frequently comes out with this plan of going back to his family in [place]. His family has been always been opposed of having him move back with them.  Social worker and I pointed this out to the patient ending he is stated he plans to go to some other relatives in [place] but is unable to provide [place] with any contact information.  It is difficult to formulate a realistic discharge plan for Mr. Schlafer. He stated that he will need at least to be discharged with an act team in place.  He is at this time his own guardian and if he declines will unfortunately  be able to do much to prevent decompensation in the future.  SW says that [employer] [facility] will give [place] a final answer today on whether they will take the case or not.   Principal Problem: Schizoaffective disorder, bipolar type (HCC) Diagnosis:   Patient Active Problem List   Diagnosis Date Noted  . Schizoaffective disorder, bipolar type (HCC) [F25.0] 06/27/2016  . Tardive dyskinesia [G24.01] 09/15/2015  . Antisocial traits [F60.2] 09/15/2015  . Tobacco use disorder [F17.200] 09/14/2015  . Cannabis use disorder, moderate, dependence (HCC) [F12.20] 09/14/2015   Total Time spent with patient: 30 minutes  Past Psychiatric  History: Schizoaffective disorder.  Past Medical History:  Past Medical History  Diagnosis Date  . Schizophrenia, paranoid (HCC)   . Tardive dyskinesia   . Tobacco abuse   . Marijuana abuse     Past Surgical History  Procedure Laterality Date  . No past surgeries     Family History: History reviewed. No pertinent family history.  Family Psychiatric  History: See H&P.  Social History:  History  Alcohol Use No     History  Drug Use  . Yes  . Special: Marijuana    Social History   Social History  . Marital Status: Single    Spouse Name: N/A  . Number of Children: N/A  . Years of Education: N/A   Social History Main Topics  . Smoking status: Current Every Day Smoker -- 2.00 packs/day    Types: Cigarettes  . Smokeless tobacco: None  . Alcohol Use: No  . Drug Use: Yes    Special: Marijuana  . Sexual Activity: Not Asked   Other Topics Concern  . None   Social History Narrative   Additional Social History:    History of alcohol / drug use?: Yes Name of Substance 1: Marijuana 1 - Age of First Use: 13 1 - Amount (size/oz): 6 1 - Frequency: daily 1 - Duration: years      Current Medications: Current Facility-Administered Medications  Medication Dose Route Frequency Provider Last Rate Last Dose  . acetaminophen (TYLENOL) tablet 650 mg  650 mg  Oral Q6H PRN Jimmy FootmanAndrea Hernandez-Gonzalez, MD   650 mg at 07/03/16 1623  . albuterol (PROVENTIL) (2.5 MG/3ML) 0.083% nebulizer solution 3 mL  3 mL Inhalation Q4H PRN Jimmy FootmanAndrea Hernandez-Gonzalez, MD      . alum & mag hydroxide-simeth (MAALOX/MYLANTA) 200-200-20 MG/5ML suspension 30 mL  30 mL Oral Q4H PRN Jimmy FootmanAndrea Hernandez-Gonzalez, MD   30 mL at 07/01/16 1937  . amantadine (SYMMETREL) capsule 100 mg  100 mg Oral BID Jimmy FootmanAndrea Hernandez-Gonzalez, MD   100 mg at 07/04/16 0841  . divalproex (DEPAKOTE ER) 24 hr tablet 1,000 mg  1,000 mg Oral QHS Jimmy FootmanAndrea Hernandez-Gonzalez, MD   1,000 mg at 07/03/16 2111  . LORazepam (ATIVAN) tablet 2 mg   2 mg Oral Q4H PRN Jimmy FootmanAndrea Hernandez-Gonzalez, MD   2 mg at 07/03/16 2111   Or  . LORazepam (ATIVAN) injection 2 mg  2 mg Intramuscular Q4H PRN Jimmy FootmanAndrea Hernandez-Gonzalez, MD      . magnesium hydroxide (MILK OF MAGNESIA) suspension 30 mL  30 mL Oral Daily PRN Jimmy FootmanAndrea Hernandez-Gonzalez, MD      . nicotine (NICODERM CQ - dosed in mg/24 hours) patch 21 mg  21 mg Transdermal Q0600 Jimmy FootmanAndrea Hernandez-Gonzalez, MD   21 mg at 06/28/16 [id number]  . OLANZapine (ZYPREXA) injection 10 mg  10 mg Intramuscular BID PRN Jimmy FootmanAndrea Hernandez-Gonzalez, MD   10 mg at 06/29/16 1249  . OLANZapine (ZYPREXA) tablet 30 mg  30 mg Oral QHS Jimmy FootmanAndrea Hernandez-Gonzalez, MD   30 mg at 07/03/16 2111    Lab Results: No results found for this or any previous visit (from the past 48 hour(s)).  Blood Alcohol level:  Lab Results  Component Value Date   [facility]   ETH <5 01/24/2016    Metabolic Disorder Labs: Lab Results  Component Value Date   HGBA1C 5.0 01/27/2016   Lab Results  Component Value Date   PROLACTIN 40.2* 01/27/2016   Lab Results  Component Value Date   CHOL 150 01/27/2016   TRIG 83 01/27/2016   HDL 44 01/27/2016   CHOLHDL 3.4 01/27/2016   VLDL 17 01/27/2016   LDLCALC 89 01/27/2016   LDLCALC 117* 09/15/2015    Physical Findings: AIMS: Facial and Oral Movements Muscles of Facial Expression: None, normal Lips and Perioral Area: None, normal Jaw: None, normal Tongue: None, normal,Extremity Movements Upper (arms, wrists, hands, fingers): None, normal Lower (legs, knees, ankles, toes): None, normal, Trunk Movements Neck, shoulders, hips: None, normal, Overall Severity Severity of abnormal movements (highest score from questions above): None, normal Incapacitation due to abnormal movements: None, normal Patient's awareness of abnormal movements (rate only patient's report): No Awareness, Dental Status Current problems with teeth and/or dentures?: No Does patient usually wear dentures?: No  CIWA:     COWS:     Musculoskeletal: Strength & Muscle Tone: within normal limits Gait & Station: normal Patient leans: N/A  Psychiatric Specialty Exam: Physical Exam  Nursing note and vitals reviewed. Constitutional: He is oriented to person, place, and time. He appears well-developed and well-nourished.  HENT:  Head: Normocephalic and atraumatic.  Eyes: EOM are normal.  Neck: Normal range of motion.  Respiratory: Effort normal.  Musculoskeletal: Normal range of motion.  Neurological: He is alert and oriented to person, place, and time.    Review of Systems  Constitutional: Negative.   HENT: Negative.   Eyes: Negative.   Respiratory: Negative.   Cardiovascular: Negative.   Gastrointestinal: Negative.   Genitourinary: Negative.   Musculoskeletal: Negative.   Skin: Negative.   Neurological:  Negative.   Endo/Heme/Allergies: Negative.   Psychiatric/Behavioral: Positive for hallucinations.    Blood pressure 107/75, pulse 99, temperature 97.9 F (36.6 C), temperature source Oral, resp. rate 18, height 6' (1.829 m), weight 79.833 kg (176 lb), SpO2 100 %.Body mass index is 23.86 kg/(m^2).  General Appearance: Casual  Eye Contact:  Good  Speech:  Clear and Coherent  Volume:  Normal  Mood:  Euphoric  Affect:  Appropriate  Thought Process:  Goal Directed  Orientation:  Full (Time, Place, and Person)  Thought Content:  Delusions and Paranoid Ideation  Suicidal Thoughts:  No  Homicidal Thoughts:  No  Memory:  Immediate;   Fair Recent;   Fair Remote;   Fair  Judgement:  Poor  Insight:  Lacking  Psychomotor Activity:  Normal  Concentration:  Concentration: Fair and Attention Span: Fair  Recall:  [employer] of Knowledge:  Fair  Language:  Fair  Akathisia:  No  Handed:  Right  AIMS (if indicated):     Assets:  Communication Skills Desire for Improvement Financial Resources/Insurance Physical Health Resilience Social Support  ADL's:  Intact  Cognition:  WNL  Sleep:  Number  of Hours: 6     Treatment Plan Summary: Daily contact with patient to assess and evaluate symptoms and progress in treatment and Medication management   Schizoaffective disorder bipolar type: The patient has been started on olanzapine. He will be continued on Haldol Decanoate 250 q 30 days ---last given on 6/27  Started on Olanzapine which has been increased to 30 mg qhs--- responding well to olanzapine  Continue depakote ER 1000 mg qhs (higher dose causes hyperammonemia).  We will check a Depakote level tomorrow am  I have d/c Haldol oral 10 mg by mouth twice a day as he has started dyskinesia and significant tremors.  EPS and tardive dyskinesia continue amantadine 100 mg by mouth twice a day  Insomnia: resolved  History of hyperammonemia secondary to treatment with Depakote. Pt says depakote dose has been reduced to 1000 mg qhs. Will check ammonia in am tomorrow  Tobacco use disorder patient will started on a nicotine patch 21 mg a day.  Precautions every 15 minute checks  Diet regular  Vital signs daily  Hospitalization and status continue involuntary commitment  Labs: Random Depakote level checked yesterday was 43--- likely indicating noncompliance.  Collateral information will be obtained from [facility] will not continue working with this patient as he has communicated threats against them. Social worker has made referrals to other act teams. [facility] declines taking over the case. Pending answer from [employer].  Dispo patient will return to his apartment once stable--- social worker reported to me pt will be evicted in 30days  Social worker will make a referral to [facility] today as requested by his act team and by his care coordinator. They report they have been unable to maintain his stability for very long. Pt aggressive when he stops taking medications.  Possible d/c back to his apartment tomorrow.  Jimmy Footman,  MD 07/04/2016, 11:11 AM

## 2016-07-04 NOTE — [facility] Group Notes (Signed)
[facility] [name] Group Therapy   07/04/2016 3pm  Type of Therapy: Group Therapy   Participation Level: Active   Participation Quality: Attentive, Sharing and Supportive   Affect: Appropriate  Cognitive: Alert and Oriented   Insight: Developing/Improving and Engaged   Engagement in Therapy: Developing/Improving and Engaged   Modes of Intervention: Clarification, Confrontation, Discussion, Education, Exploration,  Limit-setting, Orientation, Problem-solving, Rapport Building, Dance movement psychotherapisteality Testing, Socialization and Support  Summary of Progress/Problems: The topic for group therapy was feelings about diagnosis. Pt actively participated in group discussion on their past and current diagnosis and how they feel towards this. Pt also identified how society and family members judge them, based on their diagnosis as well as stereotypes and stigmas. Pt's affect was animated and the pt shared the pt was happy to be discharging. Pt shared that he recognized that many of his relatives, including his father and three uncles were diagnosed as paranoid schizophrenic and how it scared the pt at times due to his relatives not being compliant with medications.  Pt identified that this also led to his hospitalizations.  Pt was polite and cooperative with the CSW and other group members, easily redirectable and focused and attentive to the topics discussed and the sharing of others.     Dorothe PeaJonathan F. Melvyn Hommes, LCSWA, LCAS

## 2016-07-04 NOTE — Plan of Care (Signed)
Problem: Role Relationship: Goal: Ability to communicate needs accurately will improve Outcome: Progressing Patient stated that he was very upset and asked for his ativan.

## 2016-07-04 NOTE — Progress Notes (Signed)
D: Patient was somewhat agitated on the unit. Stated the doctor and social workers were lying to him to keep him here. He denies SI/HI/AVH. Patient attended group. He has been visible in the milieu interacting with peers.  A: Medication given with education. Encouragement provided. PRN Tylenol given for pain.  R: Patient compliant with medication. He has remained calm and cooperative. Safety maintained with 15 min checks

## 2016-07-04 NOTE — [facility] Group Notes (Signed)
Goals Group  Date/Time: 07/04/16 9am  Type of Therapy and Topic: Group Therapy: Goals Group: SMART Goals   Pt was called, but did not attend   Talani Brazee F. Jalani Rominger, LCSWA, LCAS  

## 2016-07-04 NOTE — Tx Team (Addendum)
Interdisciplinary Treatment Plan Update (Adult)        Date: 07/05/2016   Time Reviewed: 9:30 AM   Progress in Treatment: Improving  Attending groups: Yes Participating in groups: Yes Taking medication as prescribed: Yes  Tolerating medication: Yes  Family/Significant other contact made: No, pt refused family contact  Patient understands diagnosis: Yes  Discussing patient identified problems/goals with staff: Yes  Medical problems stabilized or resolved: Yes  Denies suicidal/homicidal ideation: Yes  Issues/concerns per patient self-inventory: Yes  Other:   New problem(s) identified: N/A   Discharge Plan or Barriers: Pt will discharge home to his apartment in [place] and will follow up with [facility] for medication management, substance abuse treatment and therapy.    Reason for Continuation of Hospitalization:   Depression   Anxiety   Medication Stabilization   Comments: N/A   Estimated date of discharge: 07/05/16   Patient is a 40 year old African-American male with history of schizoaffective disorder, antisocial traits and tardive dyskinesia. Patient is under the care of [facility].  Patient brought in on 7/3 via BPD with IVC papers completed by ACT team. Papers state patient was threatening neighbors,burning self with cigarettes, non compliant with medications. Per ER notes pt was combative at arrival. He was uncooperative with ER physician and told him  "I'm going to kill that bitch." Patient is currently prescribed with Haldol Decanoate 250 mg IM every monthly, Haldol oral 20 mg by mouth twice a day, Depakote 1500 mg qhs, amantadine 100 mg by mouth twice a day and lactulose for hyperammonemia.  Patient denies all the allegations made by the acting. He states that his neighbor at the apartment complex has been calling him and his mother the "n" word. Patient denies being aggressive towards anybody but at the same time tells me "I was just  defending myself". Patient denies depressed mood, problems with his sleep, appetite, energy or concentration. He denies suicidality, homicidality or having auditory or visual hallucinations. Stated that he has been compliant with medications.  Patient has history of tardive dyskinesia and is evident the patient has tremors and moderate intensity.  Substance abuse denies recent use of any substances. He has past history of cannabis use and he smokes about 2 packs of cigarettes per day. Patient lives in [place]. Patient will benefit from crisis stabilization, medication evaluation, group therapy, and psycho education in addition to case management for discharge planning. Patient and CSW reviewed pt's identified goals and treatment plan. Pt verbalized understanding and agreed to treatment plan.    Review of initial/current patient goals per problem list:  1. Goal(s): Patient will participate in aftercare plan   Met: Yes  Target date: 3-5 days post admission date   As evidenced by: Patient will participate within aftercare plan AEB aftercare provider and housing plan at discharge being identified.   7/5: Goal progressing.  7/7: Goal progressing.  7/12: Pt will discharge home to his apartment in [place] and will follow up with [facility] for medication management, substance abuse treatment and therapy.    2. Goal (s): Patient will exhibit decreased depressive symptoms and suicidal ideations.   Met: Adequate for discharge per MD.  Target date: 3-5 days post admission date   As evidenced by: Patient will utilize self-rating of depression at 3 or below and demonstrate decreased signs of depression or be deemed stable for discharge by MD.   7/5: Goal progressing.  7/7: Goal progressing.  7/12: Adequate for discharge per MD.  3. Goal(s): Patient will demonstrate decreased signs and symptoms of anxiety.   Met: Adequate for discharge per MD.  Target date: 3-5  days post admission date   As evidenced by: Patient will utilize self-rating of anxiety at 3 or below and demonstrated decreased signs of anxiety, or be deemed stable for discharge by MD   7/5: Goal progressing.  7/7: Goal progressing.  7/12: Adequate for discharge per MD.  4. Goal(s): Patient will demonstrate decreased signs of withdrawal due to substance abuse   Met: Yes  Target date: 3-5 days post admission date   As evidenced by: Patient will produce a CIWA/COWS score of 0, have stable vitals signs, and no symptoms of withdrawal   7/5: Patient produced a CIWA/COWS score of 0, has stable vitals signs, and no symptoms of withdrawal    5. Goal(s): Patient will demonstrate decreased signs of psychosis  * Met: Adequate for discharge per MD. * Target date: 3-5 days post admission date  * As evidenced by: Patient will demonstrate decreased frequency of AVH or return to baseline function   7/5: Goal progressing.  7/7: Goal progressing.  7/12: Adequate for discharge per MD.   Attendees:  Patient: Peter Wilson Family:  Physician: Dr. Jerilee Hoh, MD    07/05/2016 9:30 AM  Nursing: , RN       07/05/2016 9:30 AM  Clinical Social Worker: Marylou Flesher, [place]  07/05/2016 9:30 AM  Nursing: Polly Cobia, RN     07/05/2016 9:30 AM  Recreational Therapist: , LRT    07/05/2016 9:30 AM  Nursing: RN       07/05/2016 9:30 AM  Clinical Social Worker: Glorious Peach, [place]  07/05/2016 9:30 AM   Alphonse Guild. Adelfa Lozito, LCSWA, LCAS  07/05/16

## 2016-07-04 NOTE — Progress Notes (Signed)
Recreation Therapy Notes  Date: 07.11.17 Time: 9:30 am Location: Craft Room  Group Topic: Self-expression  Goal Area(s) Addresses:  Patient will identify one color per emotion listed on wheel. Patient will verbalize one emotion experienced during session. Patient will be educated on other forms of self-expression.  Behavioral Response: Attentive, Interactive, Left early  Intervention: Emotion Wheel  Activity: Patients were given an Emotion Wheel worksheet and instructed to pick a color for each emotion listed on the wheel.  Education: LRT educated patients on different forms of self-expression.  Education Outcome: In group clarification offered  Clinical Observations/Feedback: Patient completed activity by picking a color for each emotion. Patient contributed to group discussion by stating what colors he picked for certain emotions. Patient left group at approximately 10:10 am and did not return to group.  Jacquelynn CreeGreene,Taedyn Glasscock M, LRT/CTRS 07/04/2016 10:28 AM

## 2016-07-04 NOTE — Progress Notes (Addendum)
Patient with depressed affect, cooperative with meal and med. Patient irritable and concerned with discharge planning. Angry with social worker telling him he would not discharge yesterday and walking away with no processing time. No SI/HI at this time. Focused on his discharge plan and aware he has prn Ativan for increased agitation. Safety maintained. One to one with Clinical research associatewriter and patient discusses discharge plan with Child psychotherapistsocial worker and support provided by Clinical research associatewriter and patient is increasingly pleasant and cooperative. Safety maintained.

## 2016-07-04 NOTE — [facility] Group Notes (Signed)
[facility] Group Notes:  (Nursing/MHT/Case Management/Adjunct)  Date:  07/04/2016  Time:  2:21 PM  Type of Therapy:  Psychoeducational Skills  Participation Level:  Did Not Attend  Twanna Hymanda C Shandel Busic 07/04/2016, 2:21 PM

## 2016-07-04 NOTE — Progress Notes (Addendum)
  [facility] :  Will you be returning to the same living situation after discharge:  Yes,  pt will be returning to his home in [place] At discharge, do you have transportation home?: Yes,  pt will be provided with a taxi Do you have the ability to pay for your medications: Yes,  pt will be provided with prescriptions at discharge  Release of information consent forms completed and in the chart;  Patient's signature needed at discharge.  Patient to Follow up at: Follow-up Information    Go to [facility].   Why:  Your ACT team representative will arrive at your home one day after discharge to follow up for medication management, substance abuse treatment and therapy   Contact information:   [facility] [address] [place], [place] [address] Ph: [phone] Crisis Line: [phone] Fax: [phone]      Go to [facility].   Why:  Please arrive to the walk-in clinic between the hours of 8am-2:30pm for an assessment for CST, medication management and therapy.  Unk PintoHarvey Bryant will call if a follow up is needed.  Call Unk PintoHarvey Bryant at [phone] for questions and assistance.   Contact information:   [facility] [address] [place] [address] Ph: [phone] Fax: [phone]      Next level of care provider has access to [facility] Link:no  Safety Planning and Suicide Prevention discussed: No, pt refused   Have you used any form of tobacco in the last 30 days? (Cigarettes, Smokeless Tobacco, Cigars, and/or Pipes): Yes  Has patient been referred to the Quitline?: Patient refused referral  Patient has been referred for addiction treatment: Yes  Dorothe PeaJonathan F Sayler Mickiewicz 07/04/2016, 12:42 PM

## 2016-07-05 LAB — VALPROIC ACID LEVEL: Valproic Acid Lvl: 70 ug/mL (ref 50.0–100.0)

## 2016-07-05 LAB — AMMONIA: Ammonia: 24 umol/L (ref 9–35)

## 2016-07-05 MED ORDER — HALOPERIDOL DECANOATE 100 MG/ML IM SOLN: 250.0000 mg | INTRAMUSCULAR | Status: DC

## 2016-07-05 MED ORDER — OLANZAPINE 15 MG PO TABS: 30.0000 mg | ORAL_TABLET | Freq: Every day | ORAL | Status: DC

## 2016-07-05 MED ORDER — DIVALPROEX SODIUM ER 500 MG PO TB24: 1000.0000 mg | ORAL_TABLET | Freq: Every day | ORAL | Status: DC

## 2016-07-05 NOTE — Discharge Summary (Addendum)
Physician Discharge Summary Note  Patient:  Peter Wilson is an 40 y.o., male MRN:  [id number] DOB:  [birth year] Patient phone:  [phone] (home)  Patient address:   [address] [place] [address],  Total Time spent with patient: 30 minutes  Date of Admission:  06/27/2016 Date of Discharge: 07/05/16  Reason for Admission:  aggression  Principal Problem: Schizoaffective disorder, bipolar type [facility]) Discharge Diagnoses: Patient Active Problem List   Diagnosis Date Noted  . Schizoaffective disorder, bipolar type ([place]) [F25.0] 06/27/2016  . Tardive dyskinesia [G24.01] 09/15/2015  . Antisocial traits [F60.2] 09/15/2015  . Tobacco use disorder [F17.200] 09/14/2015  . Cannabis use disorder, moderate, dependence ([place]) [F12.20] 09/14/2015    History of present illness  Patient is a 40 year old African-American male with history of schizoaffective disorder, antisocial traits and tardive dyskinesia. Patient is under the care of [facility] ACT.  Patient brought in on 7/3 via BPD with IVC papers completed by ACT team. Papers state patient was threatening neighbors,burning self with cigarettes, non compliant with medications. Per ER notes pt was combative at arrival. He was uncooperative with ER physician and told him  "I'm going to kill that bitch."  Patient is currently prescribed with Haldol Decanoate 250 mg IM every monthly, Haldol oral 20 mg by mouth twice a day, Depakote 1500 mg qhs, amantadine 100 mg by mouth twice a day and lactulose for hyperammonemia.  Patient denies all the allegations made by the acting. He states that his neighbor at the apartment complex has been calling him and his mother the "n" word. Patient denies being aggressive towards anybody but at the same time tells me "I was just defending myself".  Patient denies depressed mood, problems with his sleep, appetite, energy or concentration. He denies suicidality, homicidality or having auditory or visual  hallucinations. Stated that he has been compliant with medications.  Patient has history of tardive dyskinesia and is evident the patient has tremors and moderate intensity.  Substance abuse denies recent use of any substances. He has past history of cannabis use and he smokes about 2 packs of cigarettes per day   Associated Signs/Symptoms: Depression Symptoms: denies (Hypo) Manic Symptoms: Delusions, Elevated Mood, Impulsivity, Irritable Mood, Anxiety Symptoms: denies Psychotic Symptoms: Paranoia, PTSD Symptoms: NA     Past Psychiatric History: Patient carries a diagnosis of schizoaffective d/o. He has a multitude of hospitalizations there is started in his late teens. Patient stated he has been hospitalized at all [place], [place], [place], [place] and in [facility]. His last hospitalization was here in Sep of 2016. Currently receives acting services through [employer] number [id number].   Past Medical History:  Past Medical History  Diagnosis Date  . Schizophrenia, paranoid ([place])   . Tardive dyskinesia   . Tobacco abuse   . Marijuana abuse     Past Surgical History  Procedure Laterality Date  . No past surgeries     Family History: History reviewed. No pertinent family history.  Family Psychiatric History: Reports that his father was diagnosed with schizophrenia  Tobacco Screening: 2 packs per day  Social History: Patient is originally from [place] [facility] [place] [facility]; he is single and has 2 children ages 36 and 77-year-old. The 78 year old child lives with the child's mother and his 74-year-old child lives with the patient's mother. Patient had been living at a local group home for years but now he is living in an apartment.. As far as his education patient states that he  was "kicked out" from 12th grade.  History  Alcohol Use No     History  Drug Use  . Yes  . Special: Marijuana    Social History   Social History   . Marital Status: Single    Spouse Name: N/A  . Number of Children: N/A  . Years of Education: N/A   Social History Main Topics  . Smoking status: Current Every Day Smoker -- 2.00 packs/day    Types: Cigarettes  . Smokeless tobacco: None  . Alcohol Use: No  . Drug Use: Yes    Special: Marijuana  . Sexual Activity: Not Asked   Other Topics Concern  . None   Social History Narrative    Hospital Course:    Schizoaffective disorder bipolar type: The patient has been started on olanzapine. He will be continued on Haldol Decanoate 250 q 30 days ---last given on 6/27  Started on Olanzapine which has been increased to 30 mg qhs--- responding well to olanzapine  Continue depakote ER 1000 mg qhs (higher dose causes hyperammonemia). Depakote level today was 70 and ammonia was 28 wnl  I have d/c Haldol oral 10 mg by mouth twice a day as he has started dyskinesia and significant tremors.  EPS and tardive dyskinesia continue amantadine 100 mg by mouth twice a day  Insomnia: resolved  History of hyperammonemia secondary to treatment with Depakote. Pt says depakote dose has been reduced to 1000 mg qhs. Ammonia on 7/12 was 28.  Tobacco use disorder patientreceived nicotine patch 21 mg a day.   Labs: Random Depakote level checked yesterday was 43--- likely indicating noncompliance.  Collateral information will be obtained from [facility] will not continue working with this patient as he has communicated threats against them. Social worker has made referrals to other act teams. [facility] declines taking over the case.  [facility] accepted [facility].  Dispo patient will return to his apartment once stable--- social worker reported to me pt will be evicted in 30 days for Communicating threats against his neighbor  During his stay the patient did not require seclusion, restraints or forced medications. The patient have verbal outbursts on July 9 but per staff he was  provoked by a peer and he was easily redirectable.  Patient has had some participation in programming. He has been cooperative with staff.  Today he denies depression, suicidality, homicidality or having auditory or visual hallucinations.  He has denied having any intention on harming neighbors or staff from ACT team consistently.  Today I spoke with Armen Pickup MD, Dr Sherald Hess, they are aware of pt's discharge.  The staff there continues to feel afraid.  We discuss that if they feel unsafe they could always press charges/restraining order.   During examination there is no evidence of mania, hypomania, agitation, psychosis, paranoia. Psychiatrically he is stable.   As far as medications patient is tolerating well current dose of Depakote. He also has tolerated well Zyprexa and appears that this medication has been effective in decreasing irritability and tremors.  Physical Findings: AIMS: Facial and Oral Movements Muscles of Facial Expression: None, normal Lips and Perioral Area: None, normal Jaw: None, normal Tongue: None, normal,Extremity Movements Upper (arms, wrists, hands, fingers): None, normal Lower (legs, knees, ankles, toes): None, normal, Trunk Movements Neck, shoulders, hips: None, normal, Overall Severity Severity of abnormal movements (highest score from questions above): None, normal Incapacitation due to abnormal movements: None, normal Patient's awareness of abnormal movements (rate only patient's report): No Awareness, Dental  Status Current problems with teeth and/or dentures?: No Does patient usually wear dentures?: No  CIWA:    COWS:     Musculoskeletal: Strength & Muscle Tone: within normal limits Gait & Station: normal Patient leans: N/A  Psychiatric Specialty Exam: Physical Exam  Constitutional: He is oriented to person, place, and time. He appears well-developed and well-nourished.  HENT:  Head: Normocephalic and atraumatic.  Eyes: EOM are normal.  Neck:  Normal range of motion.  Respiratory: Effort normal.  Musculoskeletal: Normal range of motion.  Neurological: He is alert and oriented to person, place, and time.    Review of Systems  Constitutional: Negative.   HENT: Negative.   Eyes: Negative.   Respiratory: Negative.   Cardiovascular: Negative.   Gastrointestinal: Negative.   Genitourinary: Negative.   Musculoskeletal: Negative.   Skin: Negative.   Neurological: Negative.   Endo/Heme/Allergies: Negative.   Psychiatric/Behavioral: Positive for substance abuse.    Blood pressure 148/51, pulse 71, temperature 98 F (36.7 C), temperature source Oral, resp. rate 18, height 6' (1.829 m), weight 79.833 kg (176 lb), SpO2 100 %.Body mass index is 23.86 kg/(m^2).  General Appearance: Fairly Groomed  Eye Contact:  Good  Speech:  Clear and Coherent  Volume:  Normal  Mood:  Euthymic  Affect:  Appropriate  Thought Process:  Linear and Descriptions of Associations: Intact  Orientation:  Full (Time, Place, and Person)  Thought Content:  Hallucinations: None  Suicidal Thoughts:  No  Homicidal Thoughts:  No  Memory:  Immediate;   Fair Recent;   Fair Remote;   Fair  Judgement:  Poor  Insight:  Shallow  Psychomotor Activity:  Normal  Concentration:  Concentration: Fair and Attention Span: Fair  Recall:  [place] of Knowledge:  Fair  Language:  Good  Akathisia:  No  Handed:    AIMS (if indicated):     Assets:  Financial Resources/Insurance Physical Health  ADL's:  Intact  Cognition:  WNL  Sleep:  Number of Hours: 7     Have you used any form of tobacco in the last 30 days? (Cigarettes, Smokeless Tobacco, Cigars, and/or Pipes): Yes  Has this patient used any form of tobacco in the last 30 days? (Cigarettes, Smokeless Tobacco, Cigars, and/or Pipes) Yes, Yes, A prescription for an FDA-approved tobacco cessation medication was offered at discharge and the patient refused  Blood Alcohol level:  Lab Results  Component Value Date    [facility] <5 06/26/2016   ETH <5 05/39/7673    Metabolic Disorder Labs:  Lab Results  Component Value Date   HGBA1C 5.0 01/27/2016   Lab Results  Component Value Date   PROLACTIN 40.2* 01/27/2016   Lab Results  Component Value Date   CHOL 150 01/27/2016   TRIG 83 01/27/2016   HDL 44 01/27/2016   CHOLHDL 3.4 01/27/2016   VLDL 17 01/27/2016   LDLCALC 89 01/27/2016   LDLCALC 117* 09/15/2015   Results for JAMESON, TORMEY (MRN [id number]) as of 07/05/2016 09:10  Ref. Range 06/26/2016 16:47 06/27/2016 17:01 07/05/2016 06:39  Sodium Latest Ref Range: 135-145 mmol/L 138    Potassium Latest Ref Range: 3.5-5.1 mmol/L 4.2    Chloride Latest Ref Range: 101-111 mmol/L 103    CO2 Latest Ref Range: 22-32 mmol/L 28    BUN Latest Ref Range: 6-20 mg/dL 10    Creatinine Latest Ref Range: 0.61-1.24 mg/dL 1.06    Calcium Latest Ref Range: 8.9-10.3 mg/dL 9.5    EGFR (Non-African Amer.) Latest Ref Range: >60 mL/min >  60    EGFR (African American) Latest Ref Range: >60 mL/min >60    Glucose Latest Ref Range: 65-99 mg/dL 103 (H)    Anion gap Latest Ref Range: 5-15  7    Alkaline Phosphatase Latest Ref Range: 38-126 U/L 60    Albumin Latest Ref Range: 3.5-5.0 g/dL 4.2    AST Latest Ref Range: 15-41 U/L 57 (H)    ALT Latest Ref Range: 17-63 U/L 52    Total Protein Latest Ref Range: 6.5-8.1 g/dL 8.0    Ammonia Latest Ref Range: 9-35 umol/L   24  Total Bilirubin Latest Ref Range: 0.3-1.2 mg/dL 0.9    WBC Latest Ref Range: 3.8-10.6 K/uL 4.9    RBC Latest Ref Range: 4.40-5.90 MIL/uL 4.27 (L)    Hemoglobin Latest Ref Range: 13.0-18.0 g/dL 12.9 (L)    HCT Latest Ref Range: 40.0-52.0 % 39.1 (L)    MCV Latest Ref Range: 80.0-100.0 fL 91.6    MCH Latest Ref Range: 26.0-34.0 pg 30.1    MCHC Latest Ref Range: 32.0-36.0 g/dL 32.9    RDW Latest Ref Range: 11.5-14.5 % 14.8 (H)    Platelets Latest Ref Range: 150-440 K/uL 247    Neutrophils Latest Units: % 43    Lymphocytes Latest Units: % 44    Monocytes Relative  Latest Units: % 10    Eosinophil Latest Units: % 2    Basophil Latest Units: % 1    NEUT# Latest Ref Range: 1.4-6.5 K/uL 2.1    Lymphocyte # Latest Ref Range: 1.0-3.6 K/uL 2.2    Monocyte # Latest Ref Range: 0.2-1.0 K/uL 0.5    Eosinophils Absolute Latest Ref Range: 0-0.7 K/uL 0.1    Basophils Absolute Latest Ref Range: 0-0.1 K/uL 0.0    Acetaminophen (Tylenol), S Latest Ref Range: 10-30 ug/mL <82 (L)    Salicylate Lvl Latest Ref Range: 2.8-30.0 mg/dL <4.0    Valproic Acid,S Latest Ref Range: 50.0-100.0 ug/mL  43 (L) 70  Alcohol, Ethyl (B) Latest Ref Range: <5 mg/dL <5      See Psychiatric Specialty Exam and Suicide Risk Assessment completed by Attending Physician prior to discharge.  Discharge destination:  Home  Is patient on multiple antipsychotic therapies at discharge:  Yes,   Do you recommend tapering to monotherapy for antipsychotics?  No   Has Patient had three or more failed trials of antipsychotic monotherapy by history:  Yes,   Antipsychotic medications that previously failed include:   1.  haldol monotherapy (decanaote) has failed.  No info about other trials is available.  Recommended Plan for Multiple Antipsychotic Therapies: Additional reason(s) for multiple antispychotic treatment:  aggression and lack of compliance     Medication List    STOP taking these medications        clonazePAM 1 MG tablet  Commonly known as:  KLONOPIN     haloperidol 10 MG tablet  Commonly known as:  HALDOL     lactulose 10 GM/15ML solution  Commonly known as:  CHRONULAC      TAKE these medications      Indication   albuterol (2.5 MG/3ML) 0.083% nebulizer solution  Commonly known as:  PROVENTIL  Inhale 3 mLs into the lungs every 4 (four) hours as needed for wheezing or shortness of breath.  Notes to Patient:  Shortness of breath      amantadine 100 MG capsule  Commonly known as:  SYMMETREL  Take 1 capsule (100 mg total) by mouth 2 (two) times daily.  Notes to  Patient:   Prevent side effects from Haldol and Zyprexa      divalproex 500 MG 24 hr tablet  Commonly known as:  DEPAKOTE ER  Take 2 tablets (1,000 mg total) by mouth at bedtime.  Notes to Patient:  Aggression, irritability      haloperidol decanoate 100 MG/ML injection  Commonly known as:  HALDOL DECANOATE  Inject 2.5 mLs (250 mg total) into the muscle every 30 (thirty) days.  Notes to Patient:  Schizoaffective disorder   Indication:  schizoaffective     OLANZapine 15 MG tablet  Commonly known as:  ZYPREXA  Take 2 tablets (30 mg total) by mouth at bedtime.  Notes to Patient:  Schizoaffective disorder            Follow-up Information    Go to [facility].   Why:  Your ACT team representative will arrive at your home one day after discharge to follow up for medication management, substance abuse treatment and therapy   Contact information:   [facility] [address] [place], [place] [address] Ph: [phone] Crisis Line: [phone] Fax: [phone]       Signed: Hildred Priest, MD 07/05/2016, 8:54 AM

## 2016-07-05 NOTE — Plan of Care (Signed)
Problem: Health Behavior/Discharge Planning: Goal: Compliance with prescribed medication regimen will improve Outcome: Progressing Patient was compliant with medications during this shift.

## 2016-07-05 NOTE — [facility] Suicide Risk Assessment (Signed)
[facility]   Principal Problem: Schizoaffective disorder, bipolar type [facility](HCC) Discharge Diagnoses:  Patient Active Problem List   Diagnosis Date Noted  . Schizoaffective disorder, bipolar type ([facility]) [F25.0] 06/27/2016  . Tardive dyskinesia [G24.01] 09/15/2015  . Antisocial traits [F60.2] 09/15/2015  . Tobacco use disorder [F17.200] 09/14/2015  . Cannabis use disorder, moderate, dependence ([facility]) [F12.20] 09/14/2015     Psychiatric Specialty Exam: ROS  Blood pressure 148/51, pulse 71, temperature 98 F (36.7 C), temperature source Oral, resp. rate 18, height 6' (1.829 m), weight 79.833 kg (176 lb), SpO2 100 %.Body mass index is 23.86 kg/(m^2).                                                       Mental Status Per Nursing Assessment::   On Admission:     Demographic Factors:  Living alone  Loss Factors: NA  Historical Factors: Impulsivity  Risk Reduction Factors:   Positive social support  Continued Clinical Symptoms:  Alcohol/Substance Abuse/Dependencies Previous Psychiatric Diagnoses and Treatments  Cognitive Features That Contribute To Risk:  Closed-mindedness    Suicide Risk:  Minimal: No identifiable suicidal ideation.  Patients presenting with no risk factors but with morbid ruminations; may be classified as minimal risk based on the severity of the depressive symptoms  Follow-up Information    Go to [facility].   Why:  Your ACT team representative will arrive at your home one day after discharge to follow up for medication management, substance abuse treatment and therapy   Contact information:   [facility] [address] [place] [address] Ph: [phone] Crisis Line: [phone] Fax: [phone]       Jimmy FootmanHernandez-Gonzalez,  Ifeanyi Mickelson, MD 07/05/2016, 8:53 AM

## 2016-07-05 NOTE — Progress Notes (Signed)
Patient ID: Peter Wilson, male   DOB: 1976-07-25, 40 y.o.   MRN: [id number] CSW was called by and informed by Judeth CornfieldStephanie at [facility] team that they had not, in fact, accepted the pt as a client and that they had sent a QP Billie, or Billy, to talk to the pt.  SI then informed the CSW that SI would review the QP's report on the QP's meeting with the pt and then would call the pt to schedule an assessment.  CSW assured the Judeth CornfieldStephanie that the CSW understood the pt had not been accepted and just needed to clarify [facility] plan for the pt.       Dorothe PeaJonathan F. Elham Fini, LCSWA, LCAS  07/05/16

## 2016-07-05 NOTE — Progress Notes (Signed)
D: Patient expressing anxiety. States he's anxious about leaving tomorrow and says he doesn't know if he'll be going to be living with his mother or father. He asked for PRN ativan. Rates his anxiety a 10/10. He denies SI/HI/AVH. He has been visible in the milieu interacting with peers.  A: Medication given with education. Encouragement provided. PRN Ativan was given. R: Patient compliant with medication. He has remained calm and cooperative. Safety maintained with 15 min checks

## 2016-07-05 NOTE — Progress Notes (Signed)
Recreation Therapy Notes  Date: 07.12.17 Time: 9:30 am Location: Craft Room  Group Topic: Self-esteem  Goal Area(s) Addresses:  Patient will write at least one positive trait about self. Patient will verbalize benefit of having healthy self-esteem.  Behavioral Response: Attentive, Interactive, Left early  Intervention: I Am  Activity: Patients were given a worksheet with the letter I on it and instructed to write as many positive traits about themselves inside the letter.  Education: LRT educated patients on ways they can increase their self-esteem.  Education Outcome: Patient left before LRT educated group.  Clinical Observations/Feedback: Patient wrote positive traits. Patient contributed to group discussion by stating that it was easy for him to think of positive traits and why. Patient left group at approximately 10:03 am. Patient did not return to group.  Jacquelynn CreeGreene,Lynnel Zanetti M, LRT/CTRS 07/05/2016 10:17 AM

## 2016-07-05 NOTE — Progress Notes (Addendum)
CSW called Unk PintoHarvey Wilson of [facility] and asked if Peter Wilson could follow up with the pt for for medication management, substance abuse treatment and therapy in the event that the pt is not assessed or is not accepted after being assessed by [facility] and Peter Wilson agreed that if the pt is agreeable he would assist the pt in getting assessed for community support team (CST) through [facility] and will assist the pt if requested to by the pt, for for medication management, substance abuse treatment and therapy, even if the pt does not qualify for CST through [facility]. The pt reported to the CSW he would be agreeable to assistance with the [facility] providers should [facility] refuse service to the pt.   Peter Wilson, LCSWA, LCAS  07/05/16

## 2016-07-05 NOTE — Progress Notes (Signed)
Patient discharged home. DC instructions provided and explained. Medications reviewed. Rx given. All questions answered. Denies SI, HI, AVH. No negative behaviors, pt stable at discharge. Excited about moving to [place] to see his children and mother. Belongings returned. Transition and risk assessment reviewed with patient.

## 2016-07-05 NOTE — Progress Notes (Signed)
CSW called [name] [facility] [facility] [facility] and informed Miss Nedra HaiLee by voicemail that the pt was being discharged.  [name] returned the call and expressed her disagreement with the decision and asked how she could advocate for the pt to not be discharged and the CSW said this was the doctors decision. Miss Nedra HaiLee stated ES's doctor had called Dr. Ardyth HarpsHernandez two days previous and Dr. Ardyth HarpsHernandez later stated she had not received this phone call and was unaware of this fact.  Approximately five minutes later [facility] Dr. Ardyth HarpsHernandez.  The CSW was not in attendance during this phone call.  [facility] shortly after, approx 10 minutes later, called the CSW and stated they had gotten information that required them to investigate further and after prompting by the CSw stated they had not yet accepted the pt, but would soon call the pt and schedule an assessment.  The CSW informed the pt of this and arranged  Second follow up with [facility] and [facility] support specialist Unk PintoHarvey Bryant.      Dorothe PeaJonathan F. Zsofia Prout, LCSWA, LCAS  07/05/16

## 2016-07-05 NOTE — Progress Notes (Signed)
CSW met with the pt Peter Wilson before discharge who is accepting the plan of returning to his apartment and is aware that he has a 30 day notice once he is served and returned to his apartment and that he will be provided with a taxi.  CSW informed the pt that Darleene Cleaver had been informed by voicemail that the pt was being discharged and would be needed by the pt regarding housing within 30 days after discharge.  CSW asked the pt is he is having thought of harming himself or others and the pt answered "No to both" and denies AVH. Pt reports he is not having thoughts of harming Thressa Sheller of [facility], nor anyone else and states "I don't want to go around her".  Pt is aware that [facility] will be contacting the pt regarding assessment after further reviewing the pt's file.   Alphonse Guild. Latham Kinzler, LCSWA, LCAS  07/05/16

## 2016-07-06 NOTE — Progress Notes (Addendum)
CSW called APS per advice from Dr. Ardyth HarpsHernandez and filed a request for an APS report to be filed due to the CSW's belief that the pt would soon be, due to past admittance and discharge patterns which were occurring with greater frequency, which may result at some point in the future, in the pt being in danger of harming himself or others.  CSW felt attempting to file an APs report would be in the best interest of the pt and for the pt's safety.    Dorothe PeaJonathan F. Lior Cartelli, LCSWA, LCAS  07/06/16

## 2016-07-09 ENCOUNTER — Encounter: Payer: Self-pay | Admitting: Emergency Medicine

## 2016-07-09 ENCOUNTER — Emergency Department
Admission: EM | Admit: 2016-07-09 | Discharge: 2016-07-09 | Disposition: A | Discharge status: Home / Self Care | Payer: Medicare Other | Attending: Emergency Medicine | Admitting: Emergency Medicine

## 2016-07-09 ENCOUNTER — Emergency Department: Payer: Medicare Other

## 2016-07-09 DIAGNOSIS — F250 Schizoaffective disorder, bipolar type: Secondary | ICD-10-CM | POA: Insufficient documentation

## 2016-07-09 DIAGNOSIS — Y9367 Activity, basketball: Secondary | ICD-10-CM | POA: Insufficient documentation

## 2016-07-09 DIAGNOSIS — Z79899 Other long term (current) drug therapy: Secondary | ICD-10-CM | POA: Insufficient documentation

## 2016-07-09 DIAGNOSIS — F1210 Cannabis abuse, uncomplicated: Secondary | ICD-10-CM | POA: Insufficient documentation

## 2016-07-09 DIAGNOSIS — W2105XA Struck by basketball, initial encounter: Secondary | ICD-10-CM | POA: Diagnosis not present

## 2016-07-09 DIAGNOSIS — S93601A Unspecified sprain of right foot, initial encounter: Secondary | ICD-10-CM | POA: Diagnosis not present

## 2016-07-09 DIAGNOSIS — Y929 Unspecified place or not applicable: Secondary | ICD-10-CM | POA: Insufficient documentation

## 2016-07-09 DIAGNOSIS — F17210 Nicotine dependence, cigarettes, uncomplicated: Secondary | ICD-10-CM | POA: Diagnosis not present

## 2016-07-09 DIAGNOSIS — M79671 Pain in right foot: Secondary | ICD-10-CM | POA: Diagnosis present

## 2016-07-09 DIAGNOSIS — Y999 Unspecified external cause status: Secondary | ICD-10-CM | POA: Insufficient documentation

## 2016-07-09 MED ORDER — OXYCODONE-ACETAMINOPHEN 5-325 MG PO TABS
ORAL_TABLET | ORAL | Status: AC
  Filled 2016-07-09: qty 1

## 2016-07-09 MED ORDER — OXYCODONE-ACETAMINOPHEN 5-325 MG PO TABS
1.0000 | ORAL_TABLET | ORAL | Status: DC | PRN
  Administered 2016-07-09: 1 via ORAL

## 2016-07-09 MED ORDER — HYDROCODONE-ACETAMINOPHEN 5-325 MG PO TABS: 1.0000 | ORAL_TABLET | Freq: Four times a day (QID) | ORAL | Status: DC | PRN

## 2016-07-09 MED ORDER — IBUPROFEN 800 MG PO TABS: 800.0000 mg | ORAL_TABLET | Freq: Three times a day (TID) | ORAL | Status: DC | PRN

## 2016-07-09 NOTE — ED Notes (Signed)
Pt states was playing basketball last pm when he injured his right foot. Pt with cms intact to right toes, complains of pain medially. Ice applied.

## 2016-07-09 NOTE — ED Provider Notes (Signed)
[facility] Emergency Department Provider Note   ____________________________________________  Time seen: Approximately 5:30 AM  I have reviewed the triage vital signs and the nursing notes.   HISTORY  Chief Complaint Foot Injury    HPI Williemae AreaQuincy Hyland is a 40 y.o. male who presents to the ED from home with a chief complaint of right foot pain.Patient states he was playing basketball last evening when he rolled his foot. Complains of pain to the dorsal aspect of his foot. Pain is worsened with ambulation. Denies associated head pain, neck pain, vision changes, chest pain, shortness of breath, abdominal pain, nausea, vomiting, diarrhea. Denies recent travel. Nothing makes his pain better.   Past Medical History  Diagnosis Date  . Schizophrenia, paranoid (HCC)   . Tardive dyskinesia   . Tobacco abuse   . Marijuana abuse     Patient Active Problem List   Diagnosis Date Noted  . Schizoaffective disorder, bipolar type (HCC) 06/27/2016  . Tardive dyskinesia 09/15/2015  . Antisocial traits 09/15/2015  . Tobacco use disorder 09/14/2015  . Cannabis use disorder, moderate, dependence (HCC) 09/14/2015    Past Surgical History  Procedure Laterality Date  . No past surgeries      Current Outpatient Rx  Name  Route  Sig  Dispense  Refill  . albuterol (PROVENTIL) (2.5 MG/3ML) 0.083% nebulizer solution   Inhalation   Inhale 3 mLs into the lungs every 4 (four) hours as needed for wheezing or shortness of breath.   75 mL   0   . amantadine (SYMMETREL) 100 MG capsule   Oral   Take 1 capsule (100 mg total) by mouth 2 (two) times daily.   60 capsule   0   . divalproex (DEPAKOTE ER) 500 MG 24 hr tablet   Oral   Take 2 tablets (1,000 mg total) by mouth at bedtime.   60 tablet   0   . haloperidol decanoate (HALDOL DECANOATE) 100 MG/ML injection   Intramuscular   Inject 2.5 mLs (250 mg total) into the muscle every 30 (thirty) days.   1 mL   0    Next inj Due July 25 250 mg IM   . HYDROcodone-acetaminophen (NORCO) 5-325 MG tablet   Oral   Take 1 tablet by mouth every 6 (six) hours as needed for moderate pain.   15 tablet   0   . ibuprofen (ADVIL,MOTRIN) 800 MG tablet   Oral   Take 1 tablet (800 mg total) by mouth every 8 (eight) hours as needed for moderate pain.   15 tablet   0   . OLANZapine (ZYPREXA) 15 MG tablet   Oral   Take 2 tablets (30 mg total) by mouth at bedtime.   60 tablet   0     Allergies Review of patient's allergies indicates no known allergies.  History reviewed. No pertinent family history.  Social History Social History  Substance Use Topics  . Smoking status: Current Every Day Smoker -- 2.00 packs/day    Types: Cigarettes  . Smokeless tobacco: Never Used  . Alcohol Use: No    Review of Systems  Constitutional: No fever/chills. Eyes: No visual changes. ENT: No sore throat. Cardiovascular: Denies chest pain. Respiratory: Denies shortness of breath. Gastrointestinal: No abdominal pain.  No nausea, no vomiting.  No diarrhea.  No constipation. Genitourinary: Negative for dysuria. Musculoskeletal: Positive for right foot pain. Negative for back pain. Skin: Negative for rash. Neurological: Negative for headaches, focal weakness or numbness.  10-point ROS otherwise negative.  ____________________________________________   PHYSICAL EXAM:  VITAL SIGNS: ED Triage Vitals  Enc Vitals Group     BP 07/09/16 0027 137/90 mmHg     Pulse Rate 07/09/16 0027 92     Resp 07/09/16 0027 18     Temp 07/09/16 0027 98.4 F (36.9 C)     Temp Source 07/09/16 0027 Oral     SpO2 07/09/16 0027 100 %     Weight 07/09/16 0027 189 lb (85.73 kg)     Height 07/09/16 0027 6' (1.829 m)     Head Cir --      Peak Flow --      Pain Score 07/09/16 0028 10     Pain Loc --      Pain Edu? --      Excl. in GC? --     Constitutional: Alert and oriented. Well appearing and in no acute distress. Eyes:  Conjunctivae are normal. PERRL. EOMI. Head: Atraumatic. Nose: No congestion/rhinnorhea. Mouth/Throat: Mucous membranes are moist.  Oropharynx non-erythematous. Neck: No stridor.  No cervical spine tenderness to palpation. Cardiovascular: Normal rate, regular rhythm. Grossly normal heart sounds.  Good peripheral circulation. Respiratory: Normal respiratory effort.  No retractions. Lungs CTAB. Gastrointestinal: Soft and nontender. No distention. No abdominal bruits. No CVA tenderness. Musculoskeletal: No swelling to right foot. Tender to palpation across dorsal foot and also at anterior no talar ligament. Limited range of motion secondary to pain. No pedal edema.  No joint effusions. 2+ distal pulses. Symmetrically warm limb without evidence for ischemia. Neurologic:  Normal speech and language. No gross focal neurologic deficits are appreciated.  Skin:  Skin is warm, dry and intact. No rash noted. Psychiatric: Mood and affect are normal. Speech and behavior are normal.  ____________________________________________   LABS (all labs ordered are listed, but only abnormal results are displayed)  Labs Reviewed - No data to display ____________________________________________  EKG  None ____________________________________________  RADIOLOGY  Right foot x-rays (view by me, interpreted per Dr. Andria Meuse): No acute bony abnormalities. Hallux valgus deformity. ____________________________________________   PROCEDURES  Procedure(s) performed: None  Procedures  Critical Care performed: No  ____________________________________________   INITIAL IMPRESSION / ASSESSMENT AND PLAN / ED COURSE  Pertinent labs & imaging results that were available during my care of the patient were reviewed by me and considered in my medical decision making (see chart for details).  40 year old male who presents with right foot sprain after playing basketball. Will treat with NSAID, analgesia,  compressive wrap, crutches and orthopedic follow-up. Strict return precautions given. Patient verbalizes understanding and agrees with plan of care. ____________________________________________   FINAL CLINICAL IMPRESSION(S) / ED DIAGNOSES  Final diagnoses:  Foot sprain, right, initial encounter      NEW MEDICATIONS STARTED DURING THIS VISIT:  New Prescriptions   HYDROCODONE-ACETAMINOPHEN (NORCO) 5-325 MG TABLET    Take 1 tablet by mouth every 6 (six) hours as needed for moderate pain.   IBUPROFEN (ADVIL,MOTRIN) 800 MG TABLET    Take 1 tablet (800 mg total) by mouth every 8 (eight) hours as needed for moderate pain.     Note:  This document was prepared using Dragon voice recognition software and may include unintentional dictation errors.    Irean Hong, MD 07/09/16 [phone]

## 2016-07-09 NOTE — Discharge Instructions (Signed)
1. You may take pain medicines as needed (Motrin/Norco #15). 2. Elevate affected area and apply ice pack over bandage. 3. You may remove bandage as needed tube and sleep. 4. Use crutches to ambulate. You may bear weight once your foot feels better. 5. Return to the ER for worsening symptoms, increased swelling, difficulty breathing or other concerns.  Foot Sprain A foot sprain is an injury to one of the strong bands of tissue (ligaments) that connect and support the many bones in your feet. The ligament can be stretched too much or it can tear. A tear can be either partial or complete. The severity of the sprain depends on how much of the ligament was damaged or torn. CAUSES A foot sprain is usually caused by suddenly twisting or pivoting your foot. RISK FACTORS This injury is more likely to occur in people who:  Play a sport, such as basketball or football.  Exercise or play a sport without warming up.  Start a new workout or sport.  Suddenly increase how long or hard they exercise or play a sport. SYMPTOMS Symptoms of this condition start soon after an injury and include:  Pain, especially in the arch of the foot.  Bruising.  Swelling.  Inability to walk or use the foot to support body weight. DIAGNOSIS This condition is diagnosed with a medical history and physical exam. You may also have imaging tests, such as:  X-rays to make sure there are no broken bones (fractures).  MRI to see if the ligament has torn. TREATMENT Treatment varies depending on the severity of your sprain. Mild sprains can be treated with rest, ice, compression, and elevation (RICE). If your ligament is overstretched or partially torn, treatment usually involves keeping your foot in a fixed position (immobilization) for a period of time. To help you do this, your health care provider will apply a bandage, splint, or walking boot to keep your foot from moving until it heals. You may also be advised to use  crutches or a scooter for a few weeks to avoid bearing weight on your foot while it is healing. If your ligament is fully torn, you may need surgery to reconnect the ligament to the bone. After surgery, a cast or splint will be applied and will need to stay on your foot while it heals. Your health care provider may also suggest exercises or physical therapy to strengthen your foot. HOME CARE INSTRUCTIONS If You Have a Bandage, Splint, or Walking Boot:  Wear it as directed by your health care provider. Remove it only as directed by your health care provider.  Loosen the bandage, splint, or walking boot if your toes become numb and tingle, or if they turn cold and blue. Bathing  If your health care provider approves bathing and showering, cover the bandage or splint with a watertight plastic bag to protect it from water. Do not let the bandage or splint get wet. Managing Pain, Stiffness, and Swelling   If directed, apply ice to the injured area:  Put ice in a plastic bag.  Place a towel between your skin and the bag.  Leave the ice on for 20 minutes, 2-3 times per day.  Move your toes often to avoid stiffness and to lessen swelling.  Raise (elevate) the injured area above the level of your heart while you are sitting or lying down. Driving  Do not drive or operate heavy machinery while taking pain medicine.  Do not drive while wearing a bandage, splint,  or walking boot on a foot that you use for driving. Activity  Rest as directed by your health care provider.  Do not use the injured foot to support your body weight until your health care provider says that you can. Use crutches or other supportive devices as directed by your health care provider.  Ask your health care provider what activities are safe for you. Gradually increase how much and how far you walk until your health care provider says it is safe to return to full activity.  Do any exercise or physical therapy as  directed by your health care provider. General Instructions  If a splint was applied, do not put pressure on any part of it until it is fully hardened. This may take several hours.  Take medicines only as directed by your health care provider. These include over-the-counter medicines and prescription medicines.  Keep all follow-up visits as directed by your health care provider. This is important.  When you can walk without pain, wear supportive shoes that have stiff soles. Do not wear flip-flops, and do not walk barefoot. SEEK MEDICAL CARE IF:  Your pain is not controlled with medicine.  Your bruising or swelling gets worse or does not get better with treatment.  Your splint or walking boot is damaged. SEEK IMMEDIATE MEDICAL CARE IF:  Your foot is numb or blue.  Your foot feels colder than normal.   This information is not intended to replace advice given to you by your health care provider. Make sure you discuss any questions you have with your health care provider.   Document Released: 06/02/2002 Document Revised: 04/27/2015 Document Reviewed: 10/14/2014 Elsevier Interactive Patient Education 2016 Elsevier Inc.  Adult nurse and RICE WHAT DOES AN ELASTIC BANDAGE DO? Elastic bandages come in different shapes and sizes. They generally provide support to your injury and reduce swelling while you are healing, but they can perform different functions. Your health care provider will help you to decide what is best for your protection, recovery, or rehabilitation following an injury. WHAT ARE SOME GENERAL TIPS FOR USING AN ELASTIC BANDAGE?  Use the bandage as directed by the maker of the bandage that you are using.  Do not wrap the bandage too tightly. This may cut off the circulation in the arm or leg in the area below the bandage.  If part of your body beyond the bandage becomes blue, numb, cold, swollen, or is more painful, your bandage is most likely too tight. If this  occurs, remove your bandage and reapply it more loosely.  See your health care provider if the bandage seems to be making your problems worse rather than better.  An elastic bandage should be removed and reapplied every 3-4 hours or as directed by your health care provider. WHAT IS RICE? The routine care of many injuries includes rest, ice, compression, and elevation (RICE therapy).  Rest Rest is required to allow your body to heal. Generally, you can resume your routine activities when you are comfortable and have been given permission by your health care provider. Ice Icing your injury helps to keep the swelling down and it reduces pain. Do not apply ice directly to your skin.  Put ice in a plastic bag.  Place a towel between your skin and the bag.  Leave the ice on for 20 minutes, 2-3 times per day. Do this for as long as you are directed by your health care provider. Compression Compression helps to keep swelling down, gives support,  and helps with discomfort. Compression may be done with an elastic bandage. Elevation Elevation helps to reduce swelling and it decreases pain. If possible, your injured area should be placed at or above the level of your heart or the center of your chest. WHEN SHOULD I SEEK MEDICAL CARE? You should seek medical care if:  You have persistent pain and swelling.  Your symptoms are getting worse rather than improving. These symptoms may indicate that further evaluation or further X-rays are needed. Sometimes, X-rays may not show a small broken bone (fracture) until a number of days later. Make a follow-up appointment with your health care provider. Ask when your X-ray results will be ready. Make sure that you get your X-ray results. WHEN SHOULD I SEEK IMMEDIATE MEDICAL CARE? You should seek immediate medical care if:  You have a sudden onset of severe pain at or below the area of your injury.  You develop redness or increased swelling around your  injury.  You have tingling or numbness at or below the area of your injury that does not improve after you remove the elastic bandage.   This information is not intended to replace advice given to you by your health care provider. Make sure you discuss any questions you have with your health care provider.   Document Released: 06/02/2002 Document Revised: 09/01/2015 Document Reviewed: 07/27/2014 Elsevier Interactive Patient Education [employer]2016 Elsevier Inc.

## 2016-07-11 ENCOUNTER — Encounter: Payer: Self-pay | Admitting: Psychiatry

## 2016-07-12 ENCOUNTER — Emergency Department
Admission: EM | Admit: 2016-07-12 | Discharge: 2016-07-13 | Disposition: A | Discharge status: Home / Self Care | Payer: Medicare Other | Attending: Emergency Medicine | Admitting: Emergency Medicine

## 2016-07-12 ENCOUNTER — Encounter: Payer: Self-pay | Admitting: Emergency Medicine

## 2016-07-12 DIAGNOSIS — Z79899 Other long term (current) drug therapy: Secondary | ICD-10-CM | POA: Diagnosis not present

## 2016-07-12 DIAGNOSIS — J45909 Unspecified asthma, uncomplicated: Secondary | ICD-10-CM

## 2016-07-12 DIAGNOSIS — F250 Schizoaffective disorder, bipolar type: Secondary | ICD-10-CM | POA: Diagnosis not present

## 2016-07-12 DIAGNOSIS — F200 Paranoid schizophrenia: Secondary | ICD-10-CM | POA: Diagnosis not present

## 2016-07-12 DIAGNOSIS — F17210 Nicotine dependence, cigarettes, uncomplicated: Secondary | ICD-10-CM | POA: Diagnosis not present

## 2016-07-12 DIAGNOSIS — F1210 Cannabis abuse, uncomplicated: Secondary | ICD-10-CM | POA: Insufficient documentation

## 2016-07-12 DIAGNOSIS — Z046 Encounter for general psychiatric examination, requested by authority: Secondary | ICD-10-CM | POA: Diagnosis present

## 2016-07-12 DIAGNOSIS — F22 Delusional disorders: Secondary | ICD-10-CM

## 2016-07-12 LAB — COMPREHENSIVE METABOLIC PANEL
ALK PHOS: 54 U/L (ref 38–126)
ALT: 21 U/L (ref 17–63)
AST: 44 U/L — AB (ref 15–41)
Albumin: 4.6 g/dL (ref 3.5–5.0)
Anion gap: 10 (ref 5–15)
BUN: 12 mg/dL (ref 6–20)
CALCIUM: 9.4 mg/dL (ref 8.9–10.3)
CHLORIDE: 104 mmol/L (ref 101–111)
CO2: 23 mmol/L (ref 22–32)
CREATININE: 1.09 mg/dL (ref 0.61–1.24)
GFR calc Af Amer: >60 mL/min (ref >60)
Glucose, Bld: 149 mg/dL — ABNORMAL HIGH (ref 65–99)
Potassium: 3.7 mmol/L (ref 3.5–5.1)
SODIUM: 137 mmol/L (ref 135–145)
Total Bilirubin: 0.9 mg/dL (ref 0.3–1.2)
Total Protein: 8.7 g/dL — ABNORMAL HIGH (ref 6.5–8.1)

## 2016-07-12 LAB — CBC
HCT: 40.3 % (ref 40.0–52.0)
HEMOGLOBIN: 13.2 g/dL (ref 13.0–18.0)
MCH: 30 pg (ref 26.0–34.0)
MCHC: 32.9 g/dL (ref 32.0–36.0)
MCV: 91.4 fL (ref 80.0–100.0)
PLATELETS: 298 10*3/uL (ref 150–440)
RBC: 4.41 MIL/uL (ref 4.40–5.90)
RDW: 14.1 % (ref 11.5–14.5)
WBC: 5.2 10*3/uL (ref 3.8–10.6)

## 2016-07-12 LAB — ETHANOL: Alcohol, Ethyl (B): <5 mg/dL (ref <5)

## 2016-07-12 LAB — SALICYLATE LEVEL: Salicylate Lvl: <4 mg/dL (ref 2.8–30.0)

## 2016-07-12 LAB — ACETAMINOPHEN LEVEL: Acetaminophen (Tylenol), Serum: <10 ug/mL — ABNORMAL LOW (ref 10–30)

## 2016-07-12 NOTE — ED Notes (Addendum)
Pt brought in by mobile crisis, pt keeps repeating "the police keep fucking with me." When asked to specify pt states that "well that should be enough." Pt states wants to to talk to something and clear his mind. Pt denies SI, when asked if pt is having thoughts of hurting someone pt states "I ani't gonna say." Pt anxious but cooperative.

## 2016-07-12 NOTE — ED Provider Notes (Signed)
[facility] Emergency Department Provider Note    ____________________________________________  Time seen: ~2145  I have reviewed the triage vital signs and the nursing notes.   HISTORY  Chief Complaint Psychiatric Evaluation   History limited by: Not Limited   HPI Peter Wilson is a 40 y.o. male with history of paranoid schizophrenia presents to the emergency department today because of concerns for paranoia and psychosis. Patient states he ran out of his medications one day ago. Since that time he has had worsening paranoia. Additionally patient states he has had hallucinations. He wants to talk to some rebound is medication. He denies any thoughts about wanting to hurt himself. Denies any homicidal ideation. The patient denies any medical complaints.    Past Medical History  Diagnosis Date  . Schizophrenia, paranoid (HCC)   . Tardive dyskinesia   . Tobacco abuse   . Marijuana abuse     Patient Active Problem List   Diagnosis Date Noted  . Schizoaffective disorder, bipolar type (HCC) 06/27/2016  . Tardive dyskinesia 09/15/2015  . Antisocial traits 09/15/2015  . Tobacco use disorder 09/14/2015  . Cannabis use disorder, moderate, dependence (HCC) 09/14/2015    Past Surgical History  Procedure Laterality Date  . No past surgeries      Current Outpatient Rx  Name  Route  Sig  Dispense  Refill  . albuterol (PROVENTIL) (2.5 MG/3ML) 0.083% nebulizer solution   Inhalation   Inhale 3 mLs into the lungs every 4 (four) hours as needed for wheezing or shortness of breath.   75 mL   0   . amantadine (SYMMETREL) 100 MG capsule   Oral   Take 1 capsule (100 mg total) by mouth 2 (two) times daily.   60 capsule   0   . divalproex (DEPAKOTE ER) 500 MG 24 hr tablet   Oral   Take 2 tablets (1,000 mg total) by mouth at bedtime.   60 tablet   0   . haloperidol decanoate (HALDOL DECANOATE) 100 MG/ML injection   Intramuscular   Inject 2.5 mLs (250  mg total) into the muscle every 30 (thirty) days.   1 mL   0     Next inj Due July 25 250 mg IM   . HYDROcodone-acetaminophen (NORCO) 5-325 MG tablet   Oral   Take 1 tablet by mouth every 6 (six) hours as needed for moderate pain.   15 tablet   0   . ibuprofen (ADVIL,MOTRIN) 800 MG tablet   Oral   Take 1 tablet (800 mg total) by mouth every 8 (eight) hours as needed for moderate pain.   15 tablet   0   . OLANZapine (ZYPREXA) 15 MG tablet   Oral   Take 2 tablets (30 mg total) by mouth at bedtime.   60 tablet   0     Allergies Review of patient's allergies indicates no known allergies.  No family history on file.  Social History Social History  Substance Use Topics  . Smoking status: Current Every Day Smoker -- 2.00 packs/day    Types: Cigarettes  . Smokeless tobacco: Never Used  . Alcohol Use: No    Review of Systems  Constitutional: Negative for fever. Cardiovascular: Negative for chest pain. Respiratory: Negative for shortness of breath. Gastrointestinal: Negative for abdominal pain, vomiting and diarrhea. Neurological: Negative for headaches, focal weakness or numbness.  10-point ROS otherwise negative.  ____________________________________________   PHYSICAL EXAM:  VITAL SIGNS: ED Triage Vitals  Enc Vitals Group  BP 07/12/16 2003 133/98 mmHg     Pulse Rate 07/12/16 2003 109     Resp 07/12/16 2003 18     Temp 07/12/16 2003 98.5 F (36.9 C)     Temp Source 07/12/16 2003 Oral     SpO2 07/12/16 2003 98 %     Weight 07/12/16 2003 178 lb (80.74 kg)     Height 07/12/16 2003 6' (1.829 m)     Head Cir --      Peak Flow --      Pain Score 07/12/16 2004 0   Constitutional: Alert and oriented. Appears slightly distraught Eyes: Conjunctivae are normal. PERRL. Normal extraocular movements. ENT   Head: Normocephalic and atraumatic.   Nose: No congestion/rhinnorhea.   Mouth/Throat: Mucous membranes are moist.   Neck: No  stridor. Hematological/Lymphatic/Immunilogical: No cervical lymphadenopathy. Cardiovascular: Normal rate, regular rhythm.  No murmurs, rubs, or gallops. Respiratory: Normal respiratory effort without tachypnea nor retractions. Breath sounds are clear and equal bilaterally. No wheezes/rales/rhonchi. Gastrointestinal: Soft and nontender. No distention.  Genitourinary: Deferred Musculoskeletal: Normal range of motion in all extremities. No joint effusions.  No lower extremity tenderness nor edema. Neurologic:  Normal speech and language. No gross focal neurologic deficits are appreciated.  Skin:  Skin is warm, dry and intact. No rash noted. Psychiatric: Patient states his paranoid. Endorses hallucinations. Denies SI or HI.  ____________________________________________    LABS (pertinent positives/negatives)  Labs Reviewed  COMPREHENSIVE METABOLIC PANEL - Abnormal; Notable for the following:    Glucose, Bld 149 (*)    Total Protein 8.7 (*)    AST 44 (*)    All other components within normal limits  ACETAMINOPHEN LEVEL - Abnormal; Notable for the following:    Acetaminophen (Tylenol), Serum <10 (*)    All other components within normal limits  ETHANOL  SALICYLATE LEVEL  CBC  URINE DRUG SCREEN, QUALITATIVE ([facility] ONLY)     ____________________________________________   EKG  None  ____________________________________________    RADIOLOGY  None  ____________________________________________   PROCEDURES  Procedure(s) performed: None  Critical Care performed: No  ____________________________________________   INITIAL IMPRESSION / ASSESSMENT AND PLAN / ED COURSE  Pertinent labs & imaging results that were available during my care of the patient were reviewed by me and considered in my medical decision making (see chart for details).  She presents to the emergency department today because of concerns for increased paranoia. Patient does have a history of  schizophrenia and is been off of his medications for one day. He denies any SI or HI. Will have patient be seen by psychiatry.  ____________________________________________   FINAL CLINICAL IMPRESSION(S) / ED DIAGNOSES  Final diagnoses:  Paranoid (HCC)     Note: This dictation was prepared with Dragon dictation. Any transcriptional errors that result from this process are unintentional    Phineas SemenGraydon Margarine Grosshans, MD 07/12/16 2209

## 2016-07-12 NOTE — ED Notes (Signed)
Pt moved from [facility] to room 20.  Meal tray provided

## 2016-07-13 DIAGNOSIS — F250 Schizoaffective disorder, bipolar type: Secondary | ICD-10-CM | POA: Diagnosis not present

## 2016-07-13 DIAGNOSIS — F200 Paranoid schizophrenia: Secondary | ICD-10-CM | POA: Diagnosis not present

## 2016-07-13 DIAGNOSIS — J45909 Unspecified asthma, uncomplicated: Secondary | ICD-10-CM

## 2016-07-13 MED ORDER — OLANZAPINE 15 MG PO TABS: 30.0000 mg | ORAL_TABLET | Freq: Every day | ORAL | Status: DC

## 2016-07-13 MED ORDER — DIVALPROEX SODIUM ER 500 MG PO TB24: 1000.0000 mg | ORAL_TABLET | Freq: Every day | ORAL | Status: DC

## 2016-07-13 MED ORDER — HALOPERIDOL DECANOATE 100 MG/ML IM SOLN: 250.0000 mg | INTRAMUSCULAR | Status: DC

## 2016-07-13 MED ORDER — AMANTADINE HCL 100 MG PO CAPS: 100.0000 mg | ORAL_CAPSULE | Freq: Two times a day (BID) | ORAL | Status: DC

## 2016-07-13 MED ORDER — HALOPERIDOL DECANOATE 100 MG/ML IM SOLN
250.0000 mg | Freq: Once | INTRAMUSCULAR | Status: AC
  Administered 2016-07-13: 250 mg via INTRAMUSCULAR
  Filled 2016-07-13: qty 2.5

## 2016-07-13 MED ORDER — ALBUTEROL SULFATE (2.5 MG/3ML) 0.083% IN NEBU: 3.0000 mL | INHALATION_SOLUTION | RESPIRATORY_TRACT | Status: DC | PRN

## 2016-07-13 NOTE — ED Notes (Signed)
Patient asleep in room. No noted distress or abnormal behavior. Will continue 15 minute checks and observation by security cameras for safety. 

## 2016-07-13 NOTE — ED Provider Notes (Signed)
-----------------------------------------   12:29 PM on 07/13/2016 -----------------------------------------   Blood pressure 111/67, pulse 64, temperature 98.5 F (36.9 C), temperature source Oral, resp. rate 18, height 6' (1.829 m), weight 178 lb (80.74 kg), SpO2 98 %.  The patient had no acute events since last update.  Calm and cooperative at this time.  Disposition is pending per Psychiatry/Behavioral Medicine team recommendations.   Patient has been cleared by psychiatry and based on discussion with Dr. Delaney Meigslaypacs patient will be discharged to follow-up with strict usually resources. He will be supplied a prescription for Haldol  [name], MD 07/13/16 1230

## 2016-07-13 NOTE — Discharge Instructions (Signed)
Paranoia  Paranoia is a general mistrust of others. People with paranoia may feel as though people are "out to get them" and the world is against them without reason. They may be afraid of others or behave in an angry or hostile way toward others.  HOME CARE INSTRUCTIONS  Monitor your thoughts and mood for any changes. Take these steps to help with your condition:  · Take over-the-counter and prescription medicines only as told by your health care provider.  · Check with your health care provider before taking any herbs or supplements.  · Keep all follow-up visits as told by your health care provider. This is important.  · Maintain a healthy lifestyle:    Eat a healthy diet.    Exercise regularly.    Get plenty of sleep.  · Avoid alcohol and drugs.  · Learn ways to reduce stress and cope with stress, such as with yoga and meditation.  · Talk about your feelings with family members or health care providers.  · Make time for yourself to do things that you enjoy.  SEEK MEDICAL CARE IF:  · Medicines do not seem to be helping.  · You feel extremely fearful and suspicious that something will harm you.  · You feel hopeless and overwhelmed.  · You feel like you cannot leave your house.  · You have trouble taking care of yourself.  SEEK IMMEDIATE MEDICAL CARE IF:  · You have serious thoughts about hurting yourself or others.     This information is not intended to replace advice given to you by your health care provider. Make sure you discuss any questions you have with your health care provider.     Document Released: 12/14/2003 Document Revised: 04/27/2015 Document Reviewed: 10/18/2014  Elsevier Interactive Patient Education ©2016 Elsevier Inc.

## 2016-07-13 NOTE — ED Notes (Signed)
Patient awake, alert, and oriented. He states he only came to the hospital for a prescription. He reports paranoia, thinking the police are after him. However, it was confirmed and told to him that he is not having any legal issues at present. Mood is anxious with congruent affect. Patient denies SI or HI. Cooperative with nursing interventions. Maintained on 15 minute checks and observation by security camera for safety.

## 2016-07-13 NOTE — Consult Note (Signed)
[facility] Consult   Reason for Consult:  Consult for this 40 year old man with history of schizoaffective disorder who presented to the emergency room last night Referring Physician: Marcelene Butte Patient Identification: Peter Wilson MRN:  [id number] Principal Diagnosis: Schizoaffective disorder, bipolar type [facility]) Diagnosis:   Patient Active Problem List   Diagnosis Date Noted  . Asthma [J45.909] 07/13/2016  . Schizoaffective disorder, bipolar type ([place]) [F25.0] 06/27/2016  . Tardive dyskinesia [G24.01] 09/15/2015  . Antisocial traits [F60.2] 09/15/2015  . Tobacco use disorder [F17.200] 09/14/2015  . Cannabis use disorder, moderate, dependence ([place]) [F12.20] 09/14/2015    Total Time spent with patient: 1 hour  Subjective:   Peter Wilson is a 40 y.o. male patient admitted with "I was feeling paranoid but I'm better now".  HPI:  Patient interviewed. Chart reviewed. Case discussed with TTS and emergency room physician. 40 year old man with schizoaffective disorder. He was just discharged from the hospital 6 days ago. He came back last night stating that he was feeling paranoid and that he had run out of his medicine. He had not been agitated or violent. He has been cooperative with treatment. On interview today the patient says that since discharge things have been going reasonably well at his apartment but he claims that he had run out of his medicine. He should have had another prescription given that he was just discharged and he can't really reconcile this but he does say that he needs new ones. He says that he had started to feel paranoid. At nighttime he started to have fears that the police were coming to get him. He did not do anything aggressive and denies any specific thoughts of hurting anyone or hurting himself. He admits that he continues to smoke marijuana fairly regularly. Denies drinking or other drugs. He has not yet had a chance to follow up with his new divider  through [facility]. Denies any new particular stressor. Today he denies any paranoia or any hallucinations or any thoughts to hurt anyone or himself.  Substance abuse history: Long-standing problem of chronic marijuana use. Not abusing other drugs. I counseled him about the obvious problem with this making his paranoia worse and encouraged him to give serious thought to cutting back or stopping his marijuana use.  Social history: Patient lives in an independent apartment. He previously had been followed by the [facility] act team who are no longer going to see him but we have arranged for [facility] to pick up his caseload. He says that he gets along okay with people in his apartment has no particular beef about it. He says his water and lights are turned on and he feels secure at home.  Medical history: Mild asthma. No other active medical problems.  Past Psychiatric History: Long-standing psychotic disorder. Multiple hospitalizations. Denies history of suicide attempts. Has been threatening and slightly aggressive in the past but denies that he's been severely violent. Has a history of noncompliance with medication that has complicated his course. Currently maintained on Haldol Decanoate as well as Zyprexa despite his obviously having tardive dyskinesia symptoms.  Risk to Self: Is patient at risk for suicide?: No Risk to Others:   Prior Inpatient Therapy:   Prior Outpatient Therapy:    Past Medical History:  Past Medical History  Diagnosis Date  . Schizophrenia, paranoid ([place])   . Tardive dyskinesia   . Tobacco abuse   . Marijuana abuse     Past Surgical History  Procedure Laterality Date  . No  past surgeries     Family History: No family history on file. Family Psychiatric  History: Father had schizophrenia as well Social History:  History  Alcohol Use No     History  Drug Use No    Social History   Social History  . Marital Status: Single     Spouse Name: N/A  . Number of Children: N/A  . Years of Education: N/A   Social History Main Topics  . Smoking status: Current Every Day Smoker -- 2.00 packs/day    Types: Cigarettes  . Smokeless tobacco: Never Used  . Alcohol Use: No  . Drug Use: No  . Sexual Activity: Not Asked   Other Topics Concern  . None   Social History Narrative   Additional Social History:    Allergies:  No Known Allergies  Labs:  Results for orders placed or performed during the hospital encounter of 07/12/16 (from the past 48 hour(s))  Comprehensive metabolic panel     Status: Abnormal   Collection Time: 07/12/16  8:08 PM  Result Value Ref Range   Sodium 137 135 - 145 mmol/L   Potassium 3.7 3.5 - 5.1 mmol/L   Chloride 104 101 - 111 mmol/L   CO2 23 22 - 32 mmol/L   Glucose, Bld 149 (H) 65 - 99 mg/dL   BUN 12 6 - 20 mg/dL   Creatinine, Ser 1.09 0.61 - 1.24 mg/dL   Calcium 9.4 8.9 - 10.3 mg/dL   Total Protein 8.7 (H) 6.5 - 8.1 g/dL   Albumin 4.6 3.5 - 5.0 g/dL   AST 44 (H) 15 - 41 U/L   ALT 21 17 - 63 U/L   Alkaline Phosphatase 54 38 - 126 U/L   Total Bilirubin 0.9 0.3 - 1.2 mg/dL   GFR calc non Af Amer >60 >60 mL/min   GFR calc Af Amer >60 >60 mL/min    Comment: (NOTE) The eGFR has been calculated using the CKD EPI equation. This calculation has not been validated in all clinical situations. eGFR's persistently <60 mL/min signify possible Chronic Kidney Disease.    Anion gap 10 5 - 15  Ethanol     Status: None   Collection Time: 07/12/16  8:08 PM  Result Value Ref Range   Alcohol, Ethyl (B) <5 <5 mg/dL    Comment:        LOWEST DETECTABLE LIMIT FOR SERUM ALCOHOL IS 5 mg/dL FOR MEDICAL PURPOSES ONLY   Salicylate level     Status: None   Collection Time: 07/12/16  8:08 PM  Result Value Ref Range   Salicylate Lvl <8.7 2.8 - 30.0 mg/dL  Acetaminophen level     Status: Abnormal   Collection Time: 07/12/16  8:08 PM  Result Value Ref Range   Acetaminophen (Tylenol), Serum <10 (L) 10  - 30 ug/mL    Comment:        THERAPEUTIC CONCENTRATIONS VARY SIGNIFICANTLY. A RANGE OF 10-30 ug/mL MAY BE AN EFFECTIVE CONCENTRATION FOR MANY PATIENTS. HOWEVER, SOME ARE BEST TREATED AT CONCENTRATIONS OUTSIDE THIS RANGE. ACETAMINOPHEN CONCENTRATIONS >150 ug/mL AT 4 HOURS AFTER INGESTION AND >50 ug/mL AT 12 HOURS AFTER INGESTION ARE OFTEN ASSOCIATED WITH TOXIC REACTIONS.   cbc     Status: None   Collection Time: 07/12/16  8:08 PM  Result Value Ref Range   WBC 5.2 3.8 - 10.6 K/uL   RBC 4.41 4.40 - 5.90 MIL/uL   Hemoglobin 13.2 13.0 - 18.0 g/dL   HCT 40.3 40.0 - 52.0 %  MCV 91.4 80.0 - 100.0 fL   MCH 30.0 26.0 - 34.0 pg   MCHC 32.9 32.0 - 36.0 g/dL   RDW 14.1 11.5 - 14.5 %   Platelets 298 150 - 440 K/uL    Current Facility-Administered Medications  Medication Dose Route Frequency Provider Last Rate Last Dose  . haloperidol decanoate (HALDOL DECANOATE) 100 MG/ML injection 250 mg  250 mg Intramuscular Once Gonzella Lex, MD       Current Outpatient Prescriptions  Medication Sig Dispense Refill  . benztropine (COGENTIN) 0.5 MG tablet Take 0.5 mg by mouth 2 (two) times daily.    . clonazePAM (KLONOPIN) 2 MG tablet Take 3 mg by mouth daily.    Marland Kitchen HYDROcodone-acetaminophen (NORCO) 5-325 MG tablet Take 1 tablet by mouth every 6 (six) hours as needed for moderate pain. 15 tablet 0  . ibuprofen (ADVIL,MOTRIN) 800 MG tablet Take 1 tablet (800 mg total) by mouth every 8 (eight) hours as needed for moderate pain. 15 tablet 0  . albuterol (PROVENTIL) (2.5 MG/3ML) 0.083% nebulizer solution Inhale 3 mLs into the lungs every 4 (four) hours as needed for wheezing or shortness of breath. 75 mL 1  . amantadine (SYMMETREL) 100 MG capsule Take 1 capsule (100 mg total) by mouth 2 (two) times daily. 60 capsule 1  . divalproex (DEPAKOTE ER) 500 MG 24 hr tablet Take 2 tablets (1,000 mg total) by mouth at bedtime. 60 tablet 1  . haloperidol decanoate (HALDOL DECANOATE) 100 MG/ML injection Inject 2.5  mLs (250 mg total) into the muscle every 30 (thirty) days. 2.5 mL 1  . OLANZapine (ZYPREXA) 15 MG tablet Take 2 tablets (30 mg total) by mouth at bedtime. 60 tablet 1    Musculoskeletal: Strength & Muscle Tone: within normal limits Gait & Station: normal Patient leans: N/A  Psychiatric Specialty Exam: Physical Exam  Nursing note and vitals reviewed. Constitutional: He appears well-developed and well-nourished.  HENT:  Head: Normocephalic and atraumatic.  Eyes: Conjunctivae are normal. Pupils are equal, round, and reactive to light.  Neck: Normal range of motion.  Cardiovascular: Regular rhythm and normal heart sounds.   Respiratory: Effort normal. No respiratory distress.  GI: Soft.  Musculoskeletal: Normal range of motion.  Neurological: He is alert.  Skin: Skin is warm and dry.  Psychiatric: His speech is normal. Judgment normal. His affect is blunt. He is not agitated and not aggressive. Thought content is not delusional. Cognition and memory are normal. He expresses no homicidal and no suicidal ideation.    Review of Systems  Constitutional: Negative.   HENT: Negative.   Eyes: Negative.   Respiratory: Negative.   Cardiovascular: Negative.   Gastrointestinal: Negative.   Musculoskeletal: Negative.   Skin: Negative.   Neurological: Negative.   Psychiatric/Behavioral: Negative for depression, suicidal ideas, hallucinations, memory loss and substance abuse. The patient is not nervous/anxious and does not have insomnia.     Blood pressure 111/67, pulse 64, temperature 98.5 F (36.9 C), temperature source Oral, resp. rate 18, height 6' (1.829 m), weight 80.74 kg (178 lb), SpO2 98 %.Body mass index is 24.14 kg/(m^2).  General Appearance: Casual  Eye Contact:  Fair  Speech:  Clear and Coherent  Volume:  Decreased  Mood:  Euthymic  Affect:  Constricted  Thought Process:  Coherent  Orientation:  Full (Time, Place, and Person)  Thought Content:  Logical  Suicidal Thoughts:   No  Homicidal Thoughts:  No  Memory:  Immediate;   Fair Recent;   Fair Remote;  Fair  Judgement:  Fair  Insight:  Fair  Psychomotor Activity:  Decreased, TD and Tremor  Concentration:  Concentration: Fair  Recall:  [employer] of Knowledge:  Fair  Language:  Fair  Akathisia:  No  Handed:  Right  AIMS (if indicated):     Assets:  Communication Skills Desire for Improvement Financial Resources/Insurance Housing Physical Health Resilience  ADL's:  Intact  Cognition:  WNL  Sleep:        Treatment Plan Summary: Medication management and Plan 40 year old man with schizoaffective disorder. Currently he is presenting as calm and appropriate and free of any acute psychosis. Has insight into how his paranoia is a symptom. Patient does not require inpatient hospitalization. It looks like his Haldol decanoate shot would probably be due for next administration in about 5 days. Since he is still having psychotic symptoms and since he is transitioning to a new treatment team I suggested to him that we give him a shot today. He is agreeable to that. Otherwise I am agreeable to writing new prescriptions for his current medicines including Zyprexa, Depakote, amantadine, albuterol and Haldol decanoate. Patient does not need IVC. He can be released back to his apartment and will follow-up with [facility].  Disposition: Patient does not meet criteria for psychiatric inpatient admission. Supportive therapy provided about ongoing stressors.  Alethia Berthold, MD 07/13/2016 11:54 AM

## 2016-07-13 NOTE — ED Notes (Signed)

## 2016-07-13 NOTE — BH Assessment (Signed)
Assessment Note  Peter Wilson is an 40 y.o. male who presents to the ER, due to paranoia. He states he was brought to the ER by a crisis worker. He was unsure who the worker was and who they worked for. He also states, he was having the feeling and thoughts the police was looking for him and was wanting to harm him. Per his report, this has happened in the past when he was living in [place], [place].   Patient was recently inpatient with [facility] from 06/26/2016 till 07/09/2016. Upon discharged he was connected with [facility]. He's outpatient provider at that time was [facility]. They discharged him from their services, due to voicing HI towards staff. His [employer] Care Coordinator was able to connect him with [facility]. As of yesterday (07/12/2016) they agreed to take him on their caseload as a client. They made plans to complete his paperwork on today (07/13/2016).  Writer spoke with both the Care Coordinator Mellody Dance [phone]) and [facility] Judeth Cornfield [phone]) and they are aware patient is in the ER. If patient is discharged, they will be able to see him today.  During the interview, the patient was calm, cooperative and polite. He was able to answer questions with appropriate answers.  Diagnosis: Schizophrenia  Past Medical History:  Past Medical History  Diagnosis Date  . Schizophrenia, paranoid ([facility])   . Tardive dyskinesia   . Tobacco abuse   . Marijuana abuse     Past Surgical History  Procedure Laterality Date  . No past surgeries      Family History: No family history on file.  Social History:  reports that he has been smoking Cigarettes.  He has been smoking about 2.00 packs per day. He has never used smokeless tobacco. He reports that he does not drink alcohol or use illicit drugs.  Additional Social History:  Alcohol / Drug Use Pain Medications: See PTA meds Prescriptions: See PTA meds Over the Counter: See PTA  meds History of alcohol / drug use?: Yes Longest period of sobriety (when/how long): UTA  Substance #1 Name of Substance 1: Marijuana 1 - Age of First Use: 13 1 - Amount (size/oz): 6 1 - Frequency: daily 1 - Duration: years 1 - Last Use / Amount: UTA  CIWA: CIWA-Ar BP: 111/67 mmHg Pulse Rate: 64 COWS:    Allergies: No Known Allergies  Home Medications:  (Not in a hospital admission)  OB/GYN Status:  No LMP for male patient.  General Assessment Data Location of Assessment: [facility] ED TTS Assessment: In system Is this a Tele or Face-to-Face Assessment?: Face-to-Face Is this an Initial Assessment or a Re-assessment for this encounter?: Initial Assessment Marital status: Single Maiden name: n/a Is patient pregnant?: No Pregnancy Status: No Living Arrangements: Alone Can pt return to current living arrangement?: Yes Admission Status: Voluntary Is patient capable of signing voluntary admission?: Yes Referral Source: Self/Family/Friend Insurance type: Medicare  Medical Screening Exam [facility] Walk-in ONLY) Medical Exam completed: Yes  Crisis Care Plan Living Arrangements: Alone Legal Guardian: Other: (Reports of none) Name of Psychiatrist: [facility] ([facility]) Name of Therapist: [facility] ([facility])  Education Status Is patient currently in school?: No Current Grade: n/a Highest grade of school patient has completed: Unknown Name of school: n/a Contact person: N/A  Risk to self with the past 6 months Suicidal Ideation: No Has patient been a risk to self within the past 6 months prior to admission? : No Suicidal Intent: No Has patient had any  suicidal intent within the past 6 months prior to admission? : No Is patient at risk for suicide?: No Suicidal Plan?: No Has patient had any suicidal plan within the past 6 months prior to admission? : No Access to Means: No What has been your use of drugs/alcohol within the last 12 months?: Alcohol and  THC Previous Attempts/Gestures: No How many times?: 0 Other Self Harm Risks: Reports of none Triggers for Past Attempts: None known Intentional Self Injurious Behavior: None Family Suicide History: No Recent stressful life event(s): Other (Comment) (Reports of none) Persecutory voices/beliefs?: No Depression: No Depression Symptoms: Feeling angry/irritable Substance abuse history and/or treatment for substance abuse?: Yes Suicide prevention information given to non-admitted patients: Not applicable  Risk to Others within the past 6 months Homicidal Ideation: No-Not Currently/Within Last 6 Months Does patient have any lifetime risk of violence toward others beyond the six months prior to admission? : Yes (comment) (History of aggression when off medications) Thoughts of Harm to Others: No-Not Currently Present/Within Last 6 Months Comment - Thoughts of Harm to Others: With last ER visit, he was voicing HI towards [facility] Staff and former Group Home residents. Current Homicidal Intent: No Current Homicidal Plan: No Describe Current Homicidal Plan: Reports of none Access to Homicidal Means: No Identified Victim: Reports of none History of harm to others?: No Assessment of Violence: None Noted Violent Behavior Description: Reports of none Does patient have access to weapons?: No Criminal Charges Pending?: No Does patient have a court date: No Is patient on probation?: No  Psychosis Hallucinations: None noted (History of A/H but currently denies it.) Delusions: Persecutory (Paranoid)  Mental Status Report Appearance/Hygiene: Unremarkable, In scrubs, In hospital gown Eye Contact: Fair Motor Activity: Freedom of movement, Unremarkable Speech: Logical/coherent Level of Consciousness: Alert Mood: Anxious, Pleasant Affect: Appropriate to circumstance, Anxious Anxiety Level: Minimal Thought Processes: Coherent, Relevant Judgement: Unimpaired Orientation: Person, Place, Time,  Situation, Appropriate for developmental age Obsessive Compulsive Thoughts/Behaviors: Minimal  Cognitive Functioning Concentration: Normal Memory: Recent Intact, Remote Intact IQ: Average Insight: Fair Impulse Control: Fair Appetite: Good Weight Loss: 0 Weight Gain: 0 Sleep: No Change Total Hours of Sleep: 8 Vegetative Symptoms: None  ADLScreening [facility] Assessment Services) Patient's cognitive ability adequate to safely complete daily activities?: Yes Patient able to express need for assistance with ADLs?: Yes Independently performs ADLs?: Yes (appropriate for developmental age)  Prior Inpatient Therapy Prior Inpatient Therapy: Yes Prior Therapy Dates: 06/2016, 12/2015 & 08/2015 Prior Therapy Facilty/Provider(s): [facility] Reason for Treatment: Schizophrenia  Prior Outpatient Therapy Prior Outpatient Therapy: Yes Prior Therapy Dates: Starting today (07/13/2016) Prior Therapy Facilty/Provider(s): [facility] Reason for Treatment: Schizophrenia Does patient have an ACCT team?: Yes ([facility]) Does patient have Intensive In-House Services?  : No Does patient have [facility] services? : No Does patient have P4CC services?: No  ADL Screening (condition at time of admission) Patient's cognitive ability adequate to safely complete daily activities?: Yes Is the patient deaf or have difficulty hearing?: No Does the patient have difficulty seeing, even when wearing glasses/contacts?: No Does the patient have difficulty concentrating, remembering, or making decisions?: No Patient able to express need for assistance with ADLs?: Yes Does the patient have difficulty dressing or bathing?: No Independently performs ADLs?: Yes (appropriate for developmental age) Does the patient have difficulty walking or climbing stairs?: No Weakness of Legs: None Weakness of Arms/Hands: None  Home Assistive Devices/Equipment Home Assistive Devices/Equipment:  None  Therapy Consults (therapy consults require a physician order) PT Evaluation Needed: No  OT Evalulation Needed: No SLP Evaluation Needed: No Abuse/Neglect Assessment (Assessment to be complete while patient is alone) Physical Abuse: Denies Verbal Abuse: Denies Sexual Abuse: Denies Exploitation of patient/patient's resources: Denies Self-Neglect: Denies Values / Beliefs Cultural Requests During Hospitalization: None Spiritual Requests During Hospitalization: None Consults Spiritual Care Consult Needed: No Social Work Consult Needed: No Merchant navy officerAdvance Directives (For Healthcare) Does patient have an advance directive?: No Would patient like information on creating an advanced directive?: No - patient declined information    Additional Information 1:1 In Past 12 Months?: No CIRT Risk: No Elopement Risk: No Does patient have medical clearance?: Yes  Child/Adolescent Assessment Running Away Risk: Denies (Patient is an adult)  Disposition:  Disposition Initial Assessment Completed for this Encounter: Yes Disposition of Patient: Other dispositions (ER MD ordered Psych Consult)  On Site Evaluation by:   Reviewed with Physician:    Lilyan Gilfordalvin J. Khyren Hing MS, LCAS, LPC, NCC, CCSI Therapeutic Triage Specialist 07/13/2016 12:04 PM

## 2016-07-13 NOTE — ED Notes (Signed)
Patient discharged ambulatory to self. He denies SI or HI. Discharge instructions reviewed with patient. Patient received copy of DC plan and all personal belongings.

## 2016-07-13 NOTE — ED Notes (Signed)
Patient given meal tray. Patient was cooperative with receiving long acting injection.  Disposition in progress.Maintained on 15 minute checks and observation by security camera for safety.

## 2016-07-15 ENCOUNTER — Emergency Department
Admission: EM | Admit: 2016-07-15 | Discharge: 2016-07-15 | Disposition: A | Discharge status: Home / Self Care | Payer: Medicare Other | Attending: Emergency Medicine | Admitting: Emergency Medicine

## 2016-07-15 ENCOUNTER — Emergency Department: Payer: Medicare Other

## 2016-07-15 DIAGNOSIS — R51 Headache: Secondary | ICD-10-CM | POA: Insufficient documentation

## 2016-07-15 DIAGNOSIS — Z79899 Other long term (current) drug therapy: Secondary | ICD-10-CM | POA: Diagnosis not present

## 2016-07-15 DIAGNOSIS — F17210 Nicotine dependence, cigarettes, uncomplicated: Secondary | ICD-10-CM | POA: Diagnosis not present

## 2016-07-15 DIAGNOSIS — J45909 Unspecified asthma, uncomplicated: Secondary | ICD-10-CM | POA: Insufficient documentation

## 2016-07-15 DIAGNOSIS — R079 Chest pain, unspecified: Secondary | ICD-10-CM

## 2016-07-15 DIAGNOSIS — R0789 Other chest pain: Secondary | ICD-10-CM | POA: Insufficient documentation

## 2016-07-15 DIAGNOSIS — R0602 Shortness of breath: Secondary | ICD-10-CM | POA: Insufficient documentation

## 2016-07-15 DIAGNOSIS — R110 Nausea: Secondary | ICD-10-CM | POA: Insufficient documentation

## 2016-07-15 LAB — CBC
HCT: 39.1 % — ABNORMAL LOW (ref 40.0–52.0)
Hemoglobin: 13.4 g/dL (ref 13.0–18.0)
MCH: 30.6 pg (ref 26.0–34.0)
MCHC: 34.3 g/dL (ref 32.0–36.0)
MCV: 89.5 fL (ref 80.0–100.0)
PLATELETS: 298 10*3/uL (ref 150–440)
RBC: 4.37 MIL/uL — ABNORMAL LOW (ref 4.40–5.90)
RDW: 14 % (ref 11.5–14.5)
WBC: 6.4 10*3/uL (ref 3.8–10.6)

## 2016-07-15 LAB — BASIC METABOLIC PANEL
ANION GAP: 10 (ref 5–15)
BUN: 11 mg/dL (ref 6–20)
CHLORIDE: 102 mmol/L (ref 101–111)
CO2: 23 mmol/L (ref 22–32)
CREATININE: 1.25 mg/dL — AB (ref 0.61–1.24)
Calcium: 9.2 mg/dL (ref 8.9–10.3)
GFR calc non Af Amer: >60 mL/min (ref >60)
Glucose, Bld: 146 mg/dL — ABNORMAL HIGH (ref 65–99)
Potassium: 3.5 mmol/L (ref 3.5–5.1)
SODIUM: 135 mmol/L (ref 135–145)

## 2016-07-15 LAB — TROPONIN I
Troponin I: <0.03 ng/mL (ref <0.03)
Troponin I: <0.03 ng/mL (ref <0.03)

## 2016-07-15 LAB — FIBRIN DERIVATIVES D-DIMER (ARMC ONLY): Fibrin derivatives D-dimer (ARMC): 496 (ref 0–499)

## 2016-07-15 LAB — LIPASE, BLOOD: LIPASE: 21 U/L (ref 11–51)

## 2016-07-15 MED ORDER — ASPIRIN 81 MG PO CHEW
324.0000 mg | CHEWABLE_TABLET | Freq: Once | ORAL | Status: AC
  Administered 2016-07-15: 324 mg via ORAL
  Filled 2016-07-15: qty 4

## 2016-07-15 MED ORDER — SUCRALFATE 1 G PO TABS: 1.0000 g | ORAL_TABLET | Freq: Two times a day (BID) | ORAL | Status: DC

## 2016-07-15 MED ORDER — SUCRALFATE 1 G PO TABS
1.0000 g | ORAL_TABLET | Freq: Once | ORAL | Status: AC
  Administered 2016-07-15: 1 g via ORAL
  Filled 2016-07-15: qty 1

## 2016-07-15 MED ORDER — FAMOTIDINE 20 MG PO TABS
40.0000 mg | ORAL_TABLET | Freq: Once | ORAL | Status: AC
  Administered 2016-07-15: 40 mg via ORAL
  Filled 2016-07-15: qty 2

## 2016-07-15 MED ORDER — BENZTROPINE MESYLATE 1 MG/ML IJ SOLN
1.0000 mg | Freq: Once | INTRAMUSCULAR | Status: AC
  Administered 2016-07-15: 1 mg via INTRAMUSCULAR
  Filled 2016-07-15: qty 1

## 2016-07-15 MED ORDER — GI COCKTAIL ~~LOC~~
30.0000 mL | Freq: Once | ORAL | Status: AC
Start: 2016-07-15 — End: 2016-07-15
  Administered 2016-07-15: 30 mL via ORAL
  Filled 2016-07-15: qty 30

## 2016-07-15 MED ORDER — GI COCKTAIL ~~LOC~~
30.0000 mL | Freq: Once | ORAL | Status: AC
  Administered 2016-07-15: 30 mL via ORAL

## 2016-07-15 MED ORDER — GI COCKTAIL ~~LOC~~
ORAL | Status: AC
  Administered 2016-07-15: 30 mL via ORAL
  Filled 2016-07-15: qty 30

## 2016-07-15 NOTE — ED Notes (Signed)
Pt reports his hand is no longer trembling

## 2016-07-15 NOTE — Discharge Instructions (Signed)
Nonspecific Chest Pain  °Chest pain can be caused by many different conditions. There is always a chance that your pain could be related to something serious, such as a heart attack or a blood clot in your lungs. Chest pain can also be caused by conditions that are not life-threatening. If you have chest pain, it is very important to follow up with your health care provider. °CAUSES  °Chest pain can be caused by: °· Heartburn. °· Pneumonia or bronchitis. °· Anxiety or stress. °· Inflammation around your heart (pericarditis) or lung (pleuritis or pleurisy). °· A blood clot in your lung. °· A collapsed lung (pneumothorax). It can develop suddenly on its own (spontaneous pneumothorax) or from trauma to the chest. °· Shingles infection (varicella-zoster virus). °· Heart attack. °· Damage to the bones, muscles, and cartilage that make up your chest wall. This can include: °¨ Bruised bones due to injury. °¨ Strained muscles or cartilage due to frequent or repeated coughing or overwork. °¨ Fracture to one or more ribs. °¨ Sore cartilage due to inflammation (costochondritis). °RISK FACTORS  °Risk factors for chest pain may include: °· Activities that increase your risk for trauma or injury to your chest. °· Respiratory infections or conditions that cause frequent coughing. °· Medical conditions or overeating that can cause heartburn. °· Heart disease or family history of heart disease. °· Conditions or health behaviors that increase your risk of developing a blood clot. °· Having had chicken pox (varicella zoster). °SIGNS AND SYMPTOMS °Chest pain can feel like: °· Burning or tingling on the surface of your chest or deep in your chest. °· Crushing, pressure, aching, or squeezing pain. °· Dull or sharp pain that is worse when you move, cough, or take a deep breath. °· Pain that is also felt in your back, neck, shoulder, or arm, or pain that spreads to any of these areas. °Your chest pain may come and go, or it may stay  constant. °DIAGNOSIS °Lab tests or other studies may be needed to find the cause of your pain. Your health care provider may have you take a test called an ambulatory ECG (electrocardiogram). An ECG records your heartbeat patterns at the time the test is performed. You may also have other tests, such as: °· Transthoracic echocardiogram (TTE). During echocardiography, sound waves are used to create a picture of all of the heart structures and to look at how blood flows through your heart. °· Transesophageal echocardiogram (TEE). This is a more advanced imaging test that obtains images from inside your body. It allows your health care provider to see your heart in finer detail. °· Cardiac monitoring. This allows your health care provider to monitor your heart rate and rhythm in real time. °· Holter monitor. This is a portable device that records your heartbeat and can help to diagnose abnormal heartbeats. It allows your health care provider to track your heart activity for several days, if needed. °· Stress tests. These can be done through exercise or by taking medicine that makes your heart beat more quickly. °· Blood tests. °· Imaging tests. °TREATMENT  °Your treatment depends on what is causing your chest pain. Treatment may include: °· Medicines. These may include: °¨ Acid blockers for heartburn. °¨ Anti-inflammatory medicine. °¨ Pain medicine for inflammatory conditions. °¨ Antibiotic medicine, if an infection is present. °¨ Medicines to dissolve blood clots. °¨ Medicines to treat coronary artery disease. °· Supportive care for conditions that do not require medicines. This may include: °¨ Resting. °¨ Applying heat   or cold packs to injured areas. °¨ Limiting activities until pain decreases. °HOME CARE INSTRUCTIONS °· If you were prescribed an antibiotic medicine, finish it all even if you start to feel better. °· Avoid any activities that bring on chest pain. °· Do not use any tobacco products, including  cigarettes, chewing tobacco, or electronic cigarettes. If you need help quitting, ask your health care provider. °· Do not drink alcohol. °· Take medicines only as directed by your health care provider. °· Keep all follow-up visits as directed by your health care provider. This is important. This includes any further testing if your chest pain does not go away. °· If heartburn is the cause for your chest pain, you may be told to keep your head raised (elevated) while sleeping. This reduces the chance that acid will go from your stomach into your esophagus. °· Make lifestyle changes as directed by your health care provider. These may include: °¨ Getting regular exercise. Ask your health care provider to suggest some activities that are safe for you. °¨ Eating a heart-healthy diet. A registered dietitian can help you to learn healthy eating options. °¨ Maintaining a healthy weight. °¨ Managing diabetes, if necessary. °¨ Reducing stress. °SEEK MEDICAL CARE IF: °· Your chest pain does not go away after treatment. °· You have a rash with blisters on your chest. °· You have a fever. °SEEK IMMEDIATE MEDICAL CARE IF:  °· Your chest pain is worse. °· You have an increasing cough, or you cough up blood. °· You have severe abdominal pain. °· You have severe weakness. °· You faint. °· You have chills. °· You have sudden, unexplained chest discomfort. °· You have sudden, unexplained discomfort in your arms, back, neck, or jaw. °· You have shortness of breath at any time. °· You suddenly start to sweat, or your skin gets clammy. °· You feel nauseous or you vomit. °· You suddenly feel light-headed or dizzy. °· Your heart begins to beat quickly, or it feels like it is skipping beats. °These symptoms may represent a serious problem that is an emergency. Do not wait to see if the symptoms will go away. Get medical help right away. Call your local emergency services (911 in the [place] Do not drive yourself to the hospital. °  °This  information is not intended to replace advice given to you by your health care provider. Make sure you discuss any questions you have with your health care provider. °  °Document Released: 09/20/2005 Document Revised: 01/01/2015 Document Reviewed: 07/17/2014 °Elsevier Interactive Patient Education ©2016 [employer] ° °

## 2016-07-15 NOTE — ED Notes (Signed)
Pt c/o of central chest pain described as sharp rated at 10 out of 10 beginning at 0800 on 7/21. Pt reports accompanying symptoms of nausea, SOB, dizziness, weakness, radiation to back and neck. Pt has visible tremor of left hand. Pt reports tremor is due the haldol he takes for his paranoid schizophrenia.

## 2016-07-15 NOTE — ED Notes (Addendum)
Patient to ED via EMS from home with lower chest pain that increases with deep breathing and movement.  Reports pain started this morning and has gotten progressively worse.  Patient states he has been upset all day and feels like that has made the pain worse.

## 2016-07-15 NOTE — ED Notes (Signed)
NAD noted at time of D/C. Pt denies questions or concerns. Pt ambulatory to the lobby at this time.  

## 2016-07-15 NOTE — ED Provider Notes (Signed)
[facility] Emergency Department Provider Note   ____________________________________________  Time seen: Approximately 3:24 AM  I have reviewed the triage vital signs and the nursing notes.   HISTORY  Chief Complaint Chest Pain    HPI Peter Wilson is a 40 y.o. male who comes into the hospital today with chest pain. The patient reports that the chest pain started when he was playing basketball around 7 PM. He reports that he did not take anything for pain. He reports the pain is in his mid chest. He has some mild shortness of breath and the pain radiates to his back. The patient has never had this pain before. He rates his pain 8 out of 10 in intensity reports sharp pain. The patient has had some nausea with no vomiting. He tried sleeping reports that he could not. He reports this was hitting up and he feels better on his right side. The patient did have a headache earlier reports he took 2 Motrin and it resolved. The patient is here for evaluation of this chest pain. He reports that the pain is worse when he takes a deep breath in.   Past Medical History  Diagnosis Date  . Schizophrenia, paranoid (HCC)   . Tardive dyskinesia   . Tobacco abuse   . Marijuana abuse     Patient Active Problem List   Diagnosis Date Noted  . Asthma 07/13/2016  . Schizoaffective disorder, bipolar type (HCC) 06/27/2016  . Tardive dyskinesia 09/15/2015  . Antisocial traits 09/15/2015  . Tobacco use disorder 09/14/2015  . Cannabis use disorder, moderate, dependence (HCC) 09/14/2015    Past Surgical History  Procedure Laterality Date  . No past surgeries      Current Outpatient Rx  Name  Route  Sig  Dispense  Refill  . albuterol (PROVENTIL) (2.5 MG/3ML) 0.083% nebulizer solution   Inhalation   Inhale 3 mLs into the lungs every 4 (four) hours as needed for wheezing or shortness of breath.   75 mL   1   . amantadine (SYMMETREL) 100 MG capsule   Oral   Take 1  capsule (100 mg total) by mouth 2 (two) times daily.   60 capsule   1   . divalproex (DEPAKOTE ER) 500 MG 24 hr tablet   Oral   Take 2 tablets (1,000 mg total) by mouth at bedtime.   60 tablet   1   . haloperidol decanoate (HALDOL DECANOATE) 100 MG/ML injection   Intramuscular   Inject 2.5 mLs (250 mg total) into the muscle every 30 (thirty) days.   2.5 mL   1     Next inj Due 08/11/2016   . OLANZapine (ZYPREXA) 15 MG tablet   Oral   Take 2 tablets (30 mg total) by mouth at bedtime.   60 tablet   1   . sucralfate (CARAFATE) 1 g tablet   Oral   Take 1 tablet (1 g total) by mouth 2 (two) times daily.   20 tablet   0     Allergies Penicillins  No family history on file.  Social History Social History  Substance Use Topics  . Smoking status: Current Every Day Smoker -- 2.00 packs/day    Types: Cigarettes  . Smokeless tobacco: Never Used  . Alcohol Use: No    Review of Systems Constitutional: No fever/chills Eyes: No visual changes. ENT: No sore throat. Cardiovascular:  chest pain. Respiratory:  shortness of breath. Gastrointestinal: nausea, no vomiting.  No diarrhea.  No constipation. Genitourinary: Negative for dysuria. Musculoskeletal: Negative for back pain. Skin: Negative for rash. Neurological: headache  10-point ROS otherwise negative.  ____________________________________________   PHYSICAL EXAM:  VITAL SIGNS: ED Triage Vitals  Enc Vitals Group     BP 07/15/16 0036 120/89 mmHg     Pulse Rate 07/15/16 0036 111     Resp 07/15/16 0036 20     Temp 07/15/16 0036 98.7 F (37.1 C)     Temp Source 07/15/16 0036 Oral     SpO2 07/15/16 0036 98 %     Weight 07/15/16 0036 178 lb (80.74 kg)     Height 07/15/16 0036 6' (1.829 m)     Head Cir --      Peak Flow --      Pain Score 07/15/16 0037 9     Pain Loc --      Pain Edu? --      Excl. in GC? --     Constitutional: Alert and oriented. Well appearing and in mild distress. Eyes: Conjunctivae  are normal. PERRL. EOMI. Head: Atraumatic. Nose: No congestion/rhinnorhea. Mouth/Throat: Mucous membranes are moist.  Oropharynx non-erythematous. Cardiovascular: Normal rate, regular rhythm. Grossly normal heart sounds.  Good peripheral circulation. Respiratory: Normal respiratory effort.  No retractions. Lungs CTAB. Gastrointestinal: Soft with some mild epigastric tenderness to palpation No distention. Bowel sounds Musculoskeletal: No lower extremity tenderness nor edema.   Neurologic:  Normal speech and language.  Skin:  Skin is warm, dry and intact.  Psychiatric: Mood and affect are normal.   ____________________________________________   LABS (all labs ordered are listed, but only abnormal results are displayed)  Labs Reviewed  BASIC METABOLIC PANEL - Abnormal; Notable for the following:    Glucose, Bld 146 (*)    Creatinine, Ser 1.25 (*)    All other components within normal limits  CBC - Abnormal; Notable for the following:    RBC 4.37 (*)    HCT 39.1 (*)    All other components within normal limits  TROPONIN I  FIBRIN DERIVATIVES D-DIMER ([facility] ONLY)  LIPASE, BLOOD  TROPONIN I   ____________________________________________  EKG  ED ECG REPORT I, Rebecka Apley, the attending physician, personally viewed and interpreted this ECG.   Date: 07/15/2016  EKG Time: 0037  Rate: 115  Rhythm: sinus tachycardia  Axis: normal  Intervals:none  ST&T Change: none  ____________________________________________  RADIOLOGY  Chest x-ray: No active cardiopulmonary disease ____________________________________________   PROCEDURES  Procedure(s) performed: None  Procedures  Critical Care performed: No  ____________________________________________   INITIAL IMPRESSION / ASSESSMENT AND PLAN / ED COURSE  Pertinent labs & imaging results that were available during my care of the patient were reviewed by me and considered in my medical decision making (see chart for  details).  This is a 40 year old male who comes into the hospital today with some chest pain that he reports radiates to his back. The patient also reports that the pain is worse when he takes a deep breath in. The patient has not taken anything for pain. He did take some ibuprofen earlier for headache. I will give the patient dose aspirin as well as a GI cocktail. The patient be reassessed once I received the results of his blood work.  The patient's blood work is unremarkable. His pain is improved at this time. The patient be discharged home to follow-up with his primary care physician. The patient did receive a dose of Cogentin which helped his tremors. The patient also received  some Carafate and Pepcid. ____________________________________________   FINAL CLINICAL IMPRESSION(S) / ED DIAGNOSES  Final diagnoses:  Chest pain, unspecified chest pain type      NEW MEDICATIONS STARTED DURING THIS VISIT:  New Prescriptions   SUCRALFATE (CARAFATE) 1 G TABLET    Take 1 tablet (1 g total) by mouth 2 (two) times daily.     Note:  This document was prepared using Dragon voice recognition software and may include unintentional dictation errors.    Rebecka Apley, MD 07/15/16 [phone]

## 2016-07-15 NOTE — ED Notes (Signed)
MD notified of patient c/o worsening chest pain 8/10. Orders received for GI cocktail, repeat EKG. VSS at this time. Explained to patient 20 min delay after receiving meds. Per MD ok for D/C after med hold. Pt alert and oriented at this time. Requests to use to the bathroom. Ambulatory with no difficulty.

## 2016-07-15 NOTE — ED Provider Notes (Signed)
The patient's pain returned when he was awoken to be discharged. The patient's vital signs are unremarkable. As the GI cocktail did help some of his pain I will give him another GI cocktail. We repeated the EKG as well.   ED ECG REPORT I, Rebecka Apley, the attending physician, personally viewed and interpreted this ECG.   Date: 07/15/2016  EKG Time: 746  Rate: 76  Rhythm: normal sinus rhythm  Axis: normal  Intervals:none  ST&T Change: none   I discussed the patient's results with them and I informed him that his troponins were negative his imaging was negative and his blood work was negative. The patient's EKG also shows no ST segment elevation. I informed him that he does need to follow up with his primary care doctor as well as a GI doctor. The patient verbalizes understanding. He will be reassessed and his pain is improving again he'll be discharged home.  Rebecka Apley, MD 07/15/16 [phone]

## 2016-07-15 NOTE — ED Notes (Signed)
Pt c/o of left hand tremble, which pt informed RN he has as a side effect of his antipsychotic medication. Informed MD Zenda Alpers. MD Zenda Alpers ordered Cogentin

## 2016-10-24 ENCOUNTER — Encounter: Payer: Self-pay | Admitting: Emergency Medicine

## 2016-10-24 ENCOUNTER — Emergency Department
Admission: EM | Admit: 2016-10-24 | Discharge: 2016-10-25 | Disposition: A | Discharge status: Home / Self Care | Payer: Medicare Other | Attending: Emergency Medicine | Admitting: Emergency Medicine

## 2016-10-24 DIAGNOSIS — T887XXA Unspecified adverse effect of drug or medicament, initial encounter: Secondary | ICD-10-CM | POA: Diagnosis present

## 2016-10-24 DIAGNOSIS — J45909 Unspecified asthma, uncomplicated: Secondary | ICD-10-CM | POA: Diagnosis not present

## 2016-10-24 DIAGNOSIS — F17210 Nicotine dependence, cigarettes, uncomplicated: Secondary | ICD-10-CM | POA: Insufficient documentation

## 2016-10-24 DIAGNOSIS — Y69 Unspecified misadventure during surgical and medical care: Secondary | ICD-10-CM | POA: Insufficient documentation

## 2016-10-24 DIAGNOSIS — T50905A Adverse effect of unspecified drugs, medicaments and biological substances, initial encounter: Secondary | ICD-10-CM

## 2016-10-24 DIAGNOSIS — T50995A Adverse effect of other drugs, medicaments and biological substances, initial encounter: Secondary | ICD-10-CM | POA: Insufficient documentation

## 2016-10-24 DIAGNOSIS — Z79899 Other long term (current) drug therapy: Secondary | ICD-10-CM | POA: Diagnosis not present

## 2016-10-24 LAB — COMPREHENSIVE METABOLIC PANEL
ALK PHOS: 55 U/L (ref 38–126)
ALT: 10 U/L — AB (ref 17–63)
AST: 32 U/L (ref 15–41)
Albumin: 4.5 g/dL (ref 3.5–5.0)
Anion gap: 8 (ref 5–15)
BILIRUBIN TOTAL: 0.3 mg/dL (ref 0.3–1.2)
BUN: 7 mg/dL (ref 6–20)
CALCIUM: 9.3 mg/dL (ref 8.9–10.3)
CHLORIDE: 101 mmol/L (ref 101–111)
CO2: 27 mmol/L (ref 22–32)
CREATININE: 0.92 mg/dL (ref 0.61–1.24)
Glucose, Bld: 102 mg/dL — ABNORMAL HIGH (ref 65–99)
Potassium: 3.5 mmol/L (ref 3.5–5.1)
Sodium: 136 mmol/L (ref 135–145)
TOTAL PROTEIN: 8.1 g/dL (ref 6.5–8.1)

## 2016-10-24 LAB — CBC
HCT: 39.4 % — ABNORMAL LOW (ref 40.0–52.0)
Hemoglobin: 12.8 g/dL — ABNORMAL LOW (ref 13.0–18.0)
MCH: 28.9 pg (ref 26.0–34.0)
MCHC: 32.4 g/dL (ref 32.0–36.0)
MCV: 89.2 fL (ref 80.0–100.0)
PLATELETS: 250 10*3/uL (ref 150–440)
RBC: 4.42 MIL/uL (ref 4.40–5.90)
RDW: 14.2 % (ref 11.5–14.5)
WBC: 5.2 10*3/uL (ref 3.8–10.6)

## 2016-10-24 LAB — ETHANOL

## 2016-10-24 NOTE — Discharge Instructions (Signed)
Please do not take any medicines except for those prescribed for you. Please do not drink with the primidone. Please follow-up with your doctor later this week. Return if you're any worse.

## 2016-10-24 NOTE — ED Notes (Signed)
Pt reports that he is "high" from some pills he took and it scared him - he took primodone and cogentin which he is prescribed - he reports he was suppose to be taking the medication for the past two months but took for the first time this morning - he states he feels like he "is swimming on the ocean" - he reported that his nurse told him to flush the medication out of his system but he could not because he was to "high" - MD at bedside to eval

## 2016-10-24 NOTE — ED Provider Notes (Addendum)
[facility] Emergency Department Provider Note   ____________________________________________   First MD Initiated Contact with Patient 10/24/16 1959     (approximate)  I have reviewed the triage vital signs and the nursing notes.   HISTORY  Chief Complaint Medication Reaction and Alcohol Intoxication    HPI Peter AreaQuincy Aiken is a 40 y.o. male patient with history essentially the same was triaged he took some pills and it scared him so he took his primidone and Cogentin which she supposed to be taking and seemed to make him higher and that he took some marijuana to make him come down and that made him even higher so he drank some alcohol and now is even higher so needs to come down. In the ER in the room patient is awake alert oriented 3 thinks his heart is racing. He is acting normal watching a movie on his iPhone I told him I would observe him for a little bit and see how he did.   Past Medical History:  Diagnosis Date  . Marijuana abuse   . Schizophrenia, paranoid (HCC)   . Tardive dyskinesia   . Tobacco abuse     Patient Active Problem List   Diagnosis Date Noted  . Asthma 07/13/2016  . Schizoaffective disorder, bipolar type (HCC) 06/27/2016  . Tardive dyskinesia 09/15/2015  . Antisocial traits 09/15/2015  . Tobacco use disorder 09/14/2015  . Cannabis use disorder, moderate, dependence (HCC) 09/14/2015    Past Surgical History:  Procedure Laterality Date  . NO PAST SURGERIES      Prior to Admission medications   Medication Sig Start Date End Date Taking? Authorizing Provider  albuterol (PROVENTIL) (2.5 MG/3ML) 0.083% nebulizer solution Inhale 3 mLs into the lungs every 4 (four) hours as needed for wheezing or shortness of breath. 07/13/16   Audery AmelJohn T Clapacs, MD  amantadine (SYMMETREL) 100 MG capsule Take 1 capsule (100 mg total) by mouth 2 (two) times daily. 07/13/16   Audery AmelJohn T Clapacs, MD  divalproex (DEPAKOTE ER) 500 MG 24 hr tablet Take 2  tablets (1,000 mg total) by mouth at bedtime. 07/13/16   Audery AmelJohn T Clapacs, MD  haloperidol decanoate (HALDOL DECANOATE) 100 MG/ML injection Inject 2.5 mLs (250 mg total) into the muscle every 30 (thirty) days. 07/13/16   Audery AmelJohn T Clapacs, MD  OLANZapine (ZYPREXA) 15 MG tablet Take 2 tablets (30 mg total) by mouth at bedtime. 07/13/16   Audery AmelJohn T Clapacs, MD  sucralfate (CARAFATE) 1 g tablet Take 1 tablet (1 g total) by mouth 2 (two) times daily. 07/15/16   Rebecka ApleyAllison P Webster, MD    Allergies Penicillins  No family history on file.  Social History Social History  Substance Use Topics  . Smoking status: Current Every Day Smoker    Packs/day: 2.00    Types: Cigarettes  . Smokeless tobacco: Never Used  . Alcohol use No    Review of Systems Constitutional: No fever/chills Eyes: No visual changes. ENT: No sore throat. Cardiovascular: Denies chest pain. Respiratory: Denies shortness of breath. Gastrointestinal: No abdominal pain.  No nausea, no vomiting.  No diarrhea.  No constipation. Genitourinary: Negative for dysuria. Musculoskeletal: Negative for back pain. Skin: Negative for rash.  10-point ROS otherwise negative.  ____________________________________________   PHYSICAL EXAM:  VITAL SIGNS: ED Triage Vitals  Enc Vitals Group     BP 10/24/16 1835 123/82     Pulse Rate 10/24/16 1835 83     Resp 10/24/16 1835 18     Temp 10/24/16  1835 98.1 F (36.7 C)     Temp Source 10/24/16 1835 Oral     SpO2 10/24/16 1835 96 %     Weight 10/24/16 1836 179 lb (81.2 kg)     Height 10/24/16 1836 6' (1.829 m)     Head Circumference --      Peak Flow --      Pain Score --      Pain Loc --      Pain Edu? --      Excl. in GC? --     Constitutional: Alert and oriented. Well appearing and in no acute distress. Eyes: Conjunctivae are normal. PERRL. EOMI. Head: Atraumatic. Nose: No congestion/rhinnorhea. Mouth/Throat: Mucous membranes are moist.  Oropharynx non-erythematous. Neck: No  stridor. Cardiovascular: Normal rate, regular rhythm. Grossly normal heart sounds.  Good peripheral circulation. Respiratory: Normal respiratory effort.  No retractions. Lungs CTAB. Gastrointestinal: Soft and nontender. No distention. No abdominal bruits. No CVA tenderness. Musculoskeletal: No lower extremity tenderness nor edema.  No joint effusions. Neurologic:  Normal speech and language. No gross focal neurologic deficits are appreciated. Skin:  Skin is warm, dry and intact. No rash noted. Psychiatric: Mood and affect are normal. Speech and behavior are normal.  ____________________________________________   LABS (all labs ordered are listed, but only abnormal results are displayed)  Labs Reviewed  COMPREHENSIVE METABOLIC PANEL - Abnormal; Notable for the following:       Result Value   Glucose, Bld 102 (*)    ALT 10 (*)    All other components within normal limits  CBC - Abnormal; Notable for the following:    Hemoglobin 12.8 (*)    HCT 39.4 (*)    All other components within normal limits  ETHANOL  URINE DRUG SCREEN, QUALITATIVE ([facility] ONLY)   ____________________________________________  EKG  EKG read and interpreted by me shows normal sinus rhythm rate of 72 normal axis possibly some early repolarization and normal however ____________________________________________  RADIOLOGY   ____________________________________________   PROCEDURES  Procedure(s) performed:  Procedures  Critical Care performed:  ____________________________________________   INITIAL IMPRESSION / ASSESSMENT AND PLAN / ED COURSE  Pertinent labs & imaging results that were available during my care of the patient were reviewed by me and considered in my medical decision making (see chart for details).    Clinical Course   At 1040 patient says he still feels very high. I told him we will watch him for some more time   ____________________________________________   FINAL CLINICAL  IMPRESSION(S) / ED DIAGNOSES  Final diagnoses:  Adverse drug effect, initial encounter      NEW MEDICATIONS STARTED DURING THIS VISIT:  New Prescriptions   No medications on file     Note:  This document was prepared using Dragon voice recognition software and may include unintentional dictation errors.    Arnaldo NatalPaul F Malinda, MD 10/24/16 [phone]    Arnaldo NatalPaul F Malinda, MD 10/24/16 [phone]

## 2016-10-24 NOTE — ED Notes (Signed)
Pt unable to void at this time. 

## 2016-10-24 NOTE — ED Triage Notes (Signed)
Pt arrived via EMS for reports of medication reaction to primidone. Pt reports the medication made him high so he smoked some marijuana. Pt reports he felt even higher so he drank some beer. Pt reports he needs to be seen because he is so high.

## 2016-10-24 NOTE — ED Notes (Signed)
Called lab to add valporic acid level

## 2016-10-25 DIAGNOSIS — T887XXA Unspecified adverse effect of drug or medicament, initial encounter: Secondary | ICD-10-CM | POA: Diagnosis not present

## 2016-10-25 LAB — URINE DRUG SCREEN, QUALITATIVE (ARMC ONLY)
AMPHETAMINES, UR SCREEN: NOT DETECTED
BARBITURATES, UR SCREEN: NOT DETECTED
BENZODIAZEPINE, UR SCRN: NOT DETECTED
Cannabinoid 50 Ng, Ur ~~LOC~~: POSITIVE — AB
Cocaine Metabolite,Ur ~~LOC~~: NOT DETECTED
MDMA (Ecstasy)Ur Screen: NOT DETECTED
METHADONE SCREEN, URINE: NOT DETECTED
Opiate, Ur Screen: NOT DETECTED
Phencyclidine (PCP) Ur S: NOT DETECTED
TRICYCLIC, UR SCREEN: NOT DETECTED

## 2016-10-25 LAB — VALPROIC ACID LEVEL

## 2016-10-25 NOTE — ED Provider Notes (Signed)
Patient received in signout from Dr. Juliette AlcideMelinda. Patient with history of schizophrenia reportedly occurred nearly scheduled primidone as well as Vistaril medication this morning and felt that he was high. UDS shows evidence of [place] use. Patient is acting appropriate and ambulating about the hall requesting discharge home. He is tolerating oral hydration and had something to eat here. The patient has worn as thought process with no evidence of SI or HI. Does not appear to be responding to any other internal stimuli at this point patient is pleasant and interactive. Patient appropriate for discharge with follow-up with psychiatry in the morning.  Have discussed with the patient and available family all diagnostics and treatments performed thus far and all questions were answered to the best of my ability. The patient demonstrates understanding and agreement with plan.    Peter EddyPatrick Koya Hunger, MD 10/25/16 [phone]

## 2016-10-27 ENCOUNTER — Encounter: Payer: Self-pay | Admitting: *Deleted

## 2016-10-27 ENCOUNTER — Emergency Department
Admission: EM | Admit: 2016-10-27 | Discharge: 2016-10-28 | Disposition: A | Discharge status: Home / Self Care | Payer: Medicare Other | Attending: Emergency Medicine | Admitting: Emergency Medicine

## 2016-10-27 DIAGNOSIS — J45909 Unspecified asthma, uncomplicated: Secondary | ICD-10-CM | POA: Insufficient documentation

## 2016-10-27 DIAGNOSIS — F410 Panic disorder [episodic paroxysmal anxiety] without agoraphobia: Secondary | ICD-10-CM | POA: Diagnosis present

## 2016-10-27 DIAGNOSIS — Z79899 Other long term (current) drug therapy: Secondary | ICD-10-CM | POA: Diagnosis not present

## 2016-10-27 DIAGNOSIS — F29 Unspecified psychosis not due to a substance or known physiological condition: Secondary | ICD-10-CM

## 2016-10-27 DIAGNOSIS — F1220 Cannabis dependence, uncomplicated: Secondary | ICD-10-CM | POA: Diagnosis present

## 2016-10-27 DIAGNOSIS — F17210 Nicotine dependence, cigarettes, uncomplicated: Secondary | ICD-10-CM | POA: Insufficient documentation

## 2016-10-27 DIAGNOSIS — F250 Schizoaffective disorder, bipolar type: Secondary | ICD-10-CM | POA: Diagnosis present

## 2016-10-27 LAB — COMPREHENSIVE METABOLIC PANEL
ALBUMIN: 3.9 g/dL (ref 3.5–5.0)
ALT: 9 U/L — AB (ref 17–63)
AST: 30 U/L (ref 15–41)
Alkaline Phosphatase: 45 U/L (ref 38–126)
Anion gap: 4 — ABNORMAL LOW (ref 5–15)
BUN: 12 mg/dL (ref 6–20)
CHLORIDE: 105 mmol/L (ref 101–111)
CO2: 30 mmol/L (ref 22–32)
CREATININE: 0.78 mg/dL (ref 0.61–1.24)
Calcium: 9.2 mg/dL (ref 8.9–10.3)
GFR calc Af Amer: >60 mL/min (ref >60)
GFR calc non Af Amer: >60 mL/min (ref >60)
GLUCOSE: 122 mg/dL — AB (ref 65–99)
Potassium: 3.6 mmol/L (ref 3.5–5.1)
SODIUM: 139 mmol/L (ref 135–145)
Total Bilirubin: 0.6 mg/dL (ref 0.3–1.2)
Total Protein: 7.1 g/dL (ref 6.5–8.1)

## 2016-10-27 LAB — CBC WITH DIFFERENTIAL/PLATELET
BASOS ABS: 0.1 10*3/uL (ref 0–0.1)
BASOS PCT: 1 %
EOS ABS: 0.1 10*3/uL (ref 0–0.7)
EOS PCT: 2 %
HCT: 35.1 % — ABNORMAL LOW (ref 40.0–52.0)
Hemoglobin: 11.9 g/dL — ABNORMAL LOW (ref 13.0–18.0)
LYMPHS PCT: 45 %
Lymphs Abs: 2.7 10*3/uL (ref 1.0–3.6)
MCH: 29.5 pg (ref 26.0–34.0)
MCHC: 34 g/dL (ref 32.0–36.0)
MCV: 87 fL (ref 80.0–100.0)
Monocytes Absolute: 0.4 10*3/uL (ref 0.2–1.0)
Monocytes Relative: 7 %
Neutro Abs: 2.7 10*3/uL (ref 1.4–6.5)
Neutrophils Relative %: 45 %
PLATELETS: 228 10*3/uL (ref 150–440)
RBC: 4.03 MIL/uL — AB (ref 4.40–5.90)
RDW: 13.8 % (ref 11.5–14.5)
WBC: 6.1 10*3/uL (ref 3.8–10.6)

## 2016-10-27 LAB — ETHANOL: Alcohol, Ethyl (B): <5 mg/dL (ref <5)

## 2016-10-27 LAB — SALICYLATE LEVEL: Salicylate Lvl: <7 mg/dL (ref 2.8–30.0)

## 2016-10-27 LAB — ACETAMINOPHEN LEVEL: Acetaminophen (Tylenol), Serum: <10 ug/mL — ABNORMAL LOW (ref 10–30)

## 2016-10-27 MED ORDER — DIPHENHYDRAMINE HCL 50 MG/ML IJ SOLN
INTRAMUSCULAR | Status: AC
  Administered 2016-10-27: 50 mg via INTRAMUSCULAR
  Filled 2016-10-27: qty 1

## 2016-10-27 MED ORDER — LORAZEPAM 2 MG/ML IJ SOLN
INTRAMUSCULAR | Status: AC
  Administered 2016-10-27: 2 mg via INTRAMUSCULAR
  Filled 2016-10-27: qty 1

## 2016-10-27 MED ORDER — DIPHENHYDRAMINE HCL 50 MG/ML IJ SOLN
50.0000 mg | Freq: Once | INTRAMUSCULAR | Status: AC
  Administered 2016-10-27: 50 mg via INTRAMUSCULAR

## 2016-10-27 MED ORDER — HALOPERIDOL LACTATE 5 MG/ML IJ SOLN
5.0000 mg | Freq: Once | INTRAMUSCULAR | Status: AC
  Administered 2016-10-27: 5 mg via INTRAMUSCULAR

## 2016-10-27 MED ORDER — HALOPERIDOL LACTATE 5 MG/ML IJ SOLN
INTRAMUSCULAR | Status: AC
  Administered 2016-10-27: 5 mg via INTRAMUSCULAR
  Filled 2016-10-27: qty 1

## 2016-10-27 MED ORDER — LORAZEPAM 2 MG/ML IJ SOLN
2.0000 mg | Freq: Once | INTRAMUSCULAR | Status: AC
  Administered 2016-10-27: 2 mg via INTRAMUSCULAR
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
## 2016-10-27 NOTE — ED Provider Notes (Signed)
[facility] Emergency Department Provider Note  ____________________________________________   First MD Initiated Contact with Patient 10/27/16 2106     (approximate)  I have reviewed the triage vital signs and the nursing notes.   HISTORY  Chief Complaint Panic Attack   HPI Peter Wilson is a 40 y.o. male with history of schizophrenia who is presenting to the emergency department tonight with agitation. He says that he is about to "put hands on one of his family members" because he says they have been calling them all day and he has become very agitated. He denies any suicidal ideation. Denies any drug abuse. Says that he has been compliant with his medications. Also thinks the ER nurses trying to kill him.   Past Medical History:  Diagnosis Date  . Marijuana abuse   . Schizophrenia, paranoid (HCC)   . Tardive dyskinesia   . Tobacco abuse     Patient Active Problem List   Diagnosis Date Noted  . Asthma 07/13/2016  . Schizoaffective disorder, bipolar type (HCC) 06/27/2016  . Tardive dyskinesia 09/15/2015  . Antisocial traits 09/15/2015  . Tobacco use disorder 09/14/2015  . Cannabis use disorder, moderate, dependence (HCC) 09/14/2015    Past Surgical History:  Procedure Laterality Date  . NO PAST SURGERIES      Prior to Admission medications   Medication Sig Start Date End Date Taking? Authorizing Provider  albuterol (PROVENTIL) (2.5 MG/3ML) 0.083% nebulizer solution Inhale 3 mLs into the lungs every 4 (four) hours as needed for wheezing or shortness of breath. 07/13/16   Audery AmelJohn T Clapacs, MD  amantadine (SYMMETREL) 100 MG capsule Take 1 capsule (100 mg total) by mouth 2 (two) times daily. 07/13/16   Audery AmelJohn T Clapacs, MD  divalproex (DEPAKOTE ER) 500 MG 24 hr tablet Take 2 tablets (1,000 mg total) by mouth at bedtime. 07/13/16   Audery AmelJohn T Clapacs, MD  haloperidol decanoate (HALDOL DECANOATE) 100 MG/ML injection Inject 2.5 mLs (250 mg total) into the  muscle every 30 (thirty) days. 07/13/16   Audery AmelJohn T Clapacs, MD  OLANZapine (ZYPREXA) 15 MG tablet Take 2 tablets (30 mg total) by mouth at bedtime. 07/13/16   Audery AmelJohn T Clapacs, MD  sucralfate (CARAFATE) 1 g tablet Take 1 tablet (1 g total) by mouth 2 (two) times daily. 07/15/16   Rebecka ApleyAllison P Webster, MD    Allergies Penicillins  History reviewed. No pertinent family history.  Social History Social History  Substance Use Topics  . Smoking status: Current Every Day Smoker    Packs/day: 2.00    Types: Cigarettes  . Smokeless tobacco: Never Used  . Alcohol use No    Review of Systems Constitutional: No fever/chills Eyes: No visual changes. ENT: No sore throat. Cardiovascular: Denies chest pain. Respiratory: Denies shortness of breath. Gastrointestinal: No abdominal pain.  No nausea, no vomiting.  No diarrhea.  No constipation. Genitourinary: Negative for dysuria. Musculoskeletal: Negative for back pain. Skin: Negative for rash. Neurological: Negative for headaches, focal weakness or numbness.  10-point ROS otherwise negative.  ____________________________________________   PHYSICAL EXAM:  VITAL SIGNS: ED Triage Vitals  Enc Vitals Group     BP 10/27/16 2059 138/68     Pulse Rate 10/27/16 2059 69     Resp 10/27/16 2059 19     Temp 10/27/16 2059 97.4 F (36.3 C)     Temp Source 10/27/16 2059 Oral     SpO2 10/27/16 2059 100 %     Weight 10/27/16 2100 179 lb (81.2 kg)  Height 10/27/16 2100 6' (1.829 m)     Head Circumference --      Peak Flow --      Pain Score --      Pain Loc --      Pain Edu? --      Excl. in GC? --     Constitutional: Alert and oriented. Agitated. Pacing around the room. Agitation appears to be escalating. Eyes: Conjunctivae are normal. PERRL. EOMI. Head: Atraumatic. Nose: No congestion/rhinnorhea. Mouth/Throat: Mucous membranes are moist.   Neck: No stridor.   Cardiovascular: Normal rate, regular rhythm. Grossly normal heart sounds.    Respiratory: Normal respiratory effort.  No retractions. Lungs CTAB. Gastrointestinal: Soft and nontender. No distention.  Musculoskeletal: No lower extremity tenderness nor edema.  No joint effusions. Neurologic:  Pressured speech. No gross focal neurologic deficits are appreciated. No gait instability. Skin:  Skin is warm, dry and intact. No rash noted. Psychiatric: Pressured speech. Agitated.  ____________________________________________   LABS (all labs ordered are listed, but only abnormal results are displayed)  Labs Reviewed  CBC WITH DIFFERENTIAL/PLATELET  COMPREHENSIVE METABOLIC PANEL  ETHANOL  ACETAMINOPHEN LEVEL  SALICYLATE LEVEL  URINE DRUG SCREEN, QUALITATIVE ([facility] ONLY)  URINALYSIS COMPLETEWITH MICROSCOPIC ([facility] ONLY)  VALPROIC ACID LEVEL   ____________________________________________  EKG   ____________________________________________  RADIOLOGY   ____________________________________________   PROCEDURES  Procedure(s) performed:   Procedures  Critical Care performed:   ____________________________________________   INITIAL IMPRESSION / ASSESSMENT AND PLAN / ED COURSE  Pertinent labs & imaging results that were available during my care of the patient were reviewed by me and considered in my medical decision making (see chart for details).  ----------------------------------------- 9:32 PM on 10/27/2016 -----------------------------------------  Patient is becoming increasingly agitated. Needing law enforcement at bedside. Involuntary commitment completed. We will sedate the patient with Haldol, Ativan as well as Benadryl. Plan for him to be counseled by TTS as well as psychiatry. The patient is understanding of these plans and went to comply.  Clinical Course     ____________________________________________   FINAL CLINICAL IMPRESSION(S) / ED DIAGNOSES  Psychosis.    NEW MEDICATIONS STARTED DURING THIS VISIT:  New Prescriptions    No medications on file     Note:  This document was prepared using Dragon voice recognition software and may include unintentional dictation errors.    Myrna Blazeravid Matthew Schaevitz, MD 10/27/16 2133

## 2016-10-27 NOTE — ED Notes (Signed)
PT refused to have his blood drawn or provide a urine sample. MD made aware. Pt has come out of room twice with shaking hands reporting he "needs to leave" and then has asked to talk to the ODS officer alone in the room. ODS officer was able to deescalate the situation.

## 2016-10-27 NOTE — ED Notes (Signed)
Pt sleeping at this time and remains in jeans and blue hoodie. Pt has red gloves. Pt unable to dress out at this time.

## 2016-10-27 NOTE — ED Notes (Signed)
RN informed pt he could have one phone call to his social worker before his phone would be taken and he would be dressed out. Pt reports he will not allow this to happen and he would "woop their assess" if they tried to take his cloths.

## 2016-10-28 DIAGNOSIS — F250 Schizoaffective disorder, bipolar type: Secondary | ICD-10-CM

## 2016-10-28 DIAGNOSIS — F29 Unspecified psychosis not due to a substance or known physiological condition: Secondary | ICD-10-CM | POA: Diagnosis not present

## 2016-10-28 LAB — URINE DRUG SCREEN, QUALITATIVE (ARMC ONLY)
Amphetamines, Ur Screen: NOT DETECTED
Barbiturates, Ur Screen: NOT DETECTED
Benzodiazepine, Ur Scrn: POSITIVE — AB
CANNABINOID 50 NG, UR ~~LOC~~: POSITIVE — AB
Cocaine Metabolite,Ur ~~LOC~~: NOT DETECTED
MDMA (ECSTASY) UR SCREEN: NOT DETECTED
Methadone Scn, Ur: NOT DETECTED
Opiate, Ur Screen: NOT DETECTED
PHENCYCLIDINE (PCP) UR S: NOT DETECTED
Tricyclic, Ur Screen: NOT DETECTED

## 2016-10-28 LAB — URINALYSIS COMPLETE WITH MICROSCOPIC (ARMC ONLY)
BILIRUBIN URINE: NEGATIVE
GLUCOSE, UA: NEGATIVE mg/dL
Hgb urine dipstick: NEGATIVE
Ketones, ur: NEGATIVE mg/dL
Nitrite: NEGATIVE
Protein, ur: NEGATIVE mg/dL
SQUAMOUS EPITHELIAL / LPF: NONE SEEN
Specific Gravity, Urine: 1.018 (ref 1.005–1.030)
pH: 6 (ref 5.0–8.0)

## 2016-10-28 LAB — VALPROIC ACID LEVEL

## 2016-10-28 NOTE — ED Notes (Signed)
BEHAVIORAL HEALTH ROUNDING Patient sleeping: Yes.   Patient alert and oriented: not applicable SLEEPING Behavior appropriate: Yes.  ; If no, describe: SLEEPING Nutrition and fluids offered: No SLEEPING Toileting and hygiene offered: NoSLEEPING Sitter present: not applicable, Q 15 min safety rounds and observation. Law enforcement present: Yes ODS 

## 2016-10-28 NOTE — Discharge Instructions (Signed)
Please seek medical attention and help for any thoughts about wanting to harm herself, harm others, any concerning change in behavior, severe depression, inappropriate drug use or any other new or concerning symptoms. ° °

## 2016-10-28 NOTE — ED Notes (Signed)
Pt changed into scrubs with Perlie Goldussell, tech. Phone, food stamp card, keys, cigarettes, lighter, shoes and clothes placed in belongings bag. It is ok for pt to continue to wear his gloves per Derrill KayGoodman, Md . 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

## 2016-10-28 NOTE — ED Notes (Signed)
Pt resting in room eyes closed at this time.
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
## 2016-10-28 NOTE — ED Notes (Addendum)
Pt sleeping at this time. Remains in street clothes

## 2016-10-28 NOTE — ED Notes (Signed)

## 2016-10-28 NOTE — ED Notes (Addendum)
BEHAVIORAL HEALTH ROUNDING Patient sleeping: Yes.   Patient alert and oriented: not applicable SLEEPING Behavior appropriate: Yes.  ; If no, describe: SLEEPING Nutrition and fluids offered: No SLEEPING Toileting and hygiene offered: NoSLEEPING Sitter present: not applicable, Q 15 min safety rounds and observation. Law enforcement present: Yes ODS  ENVIRONMENTAL ASSESSMENT  Potentially harmful objects out of patient reach: Yes.  Personal belongings secured: Yes.  Patient dressed in hospital provided attire only: No pt uncooperative with dressing out earlier and now asleep.  Plastic bags out of patient reach: Yes.  Patient care equipment (cords, cables, call bells, lines, and drains) shortened, removed, or accounted for: Yes.  Equipment and supplies removed from bottom of stretcher: Yes.  Potentially toxic materials out of patient reach: Yes.  Sharps container removed or out of patient reach: Yes.

## 2017-03-24 IMAGING — CR DG CHEST 2V
2 series · 2 of 2 positions shown · non-contrast
Comparison: None.

CLINICAL DATA: Lower chest pain increasing with deep breathing and
movement.

EXAM:
CHEST  2 VIEW

[chest pa]
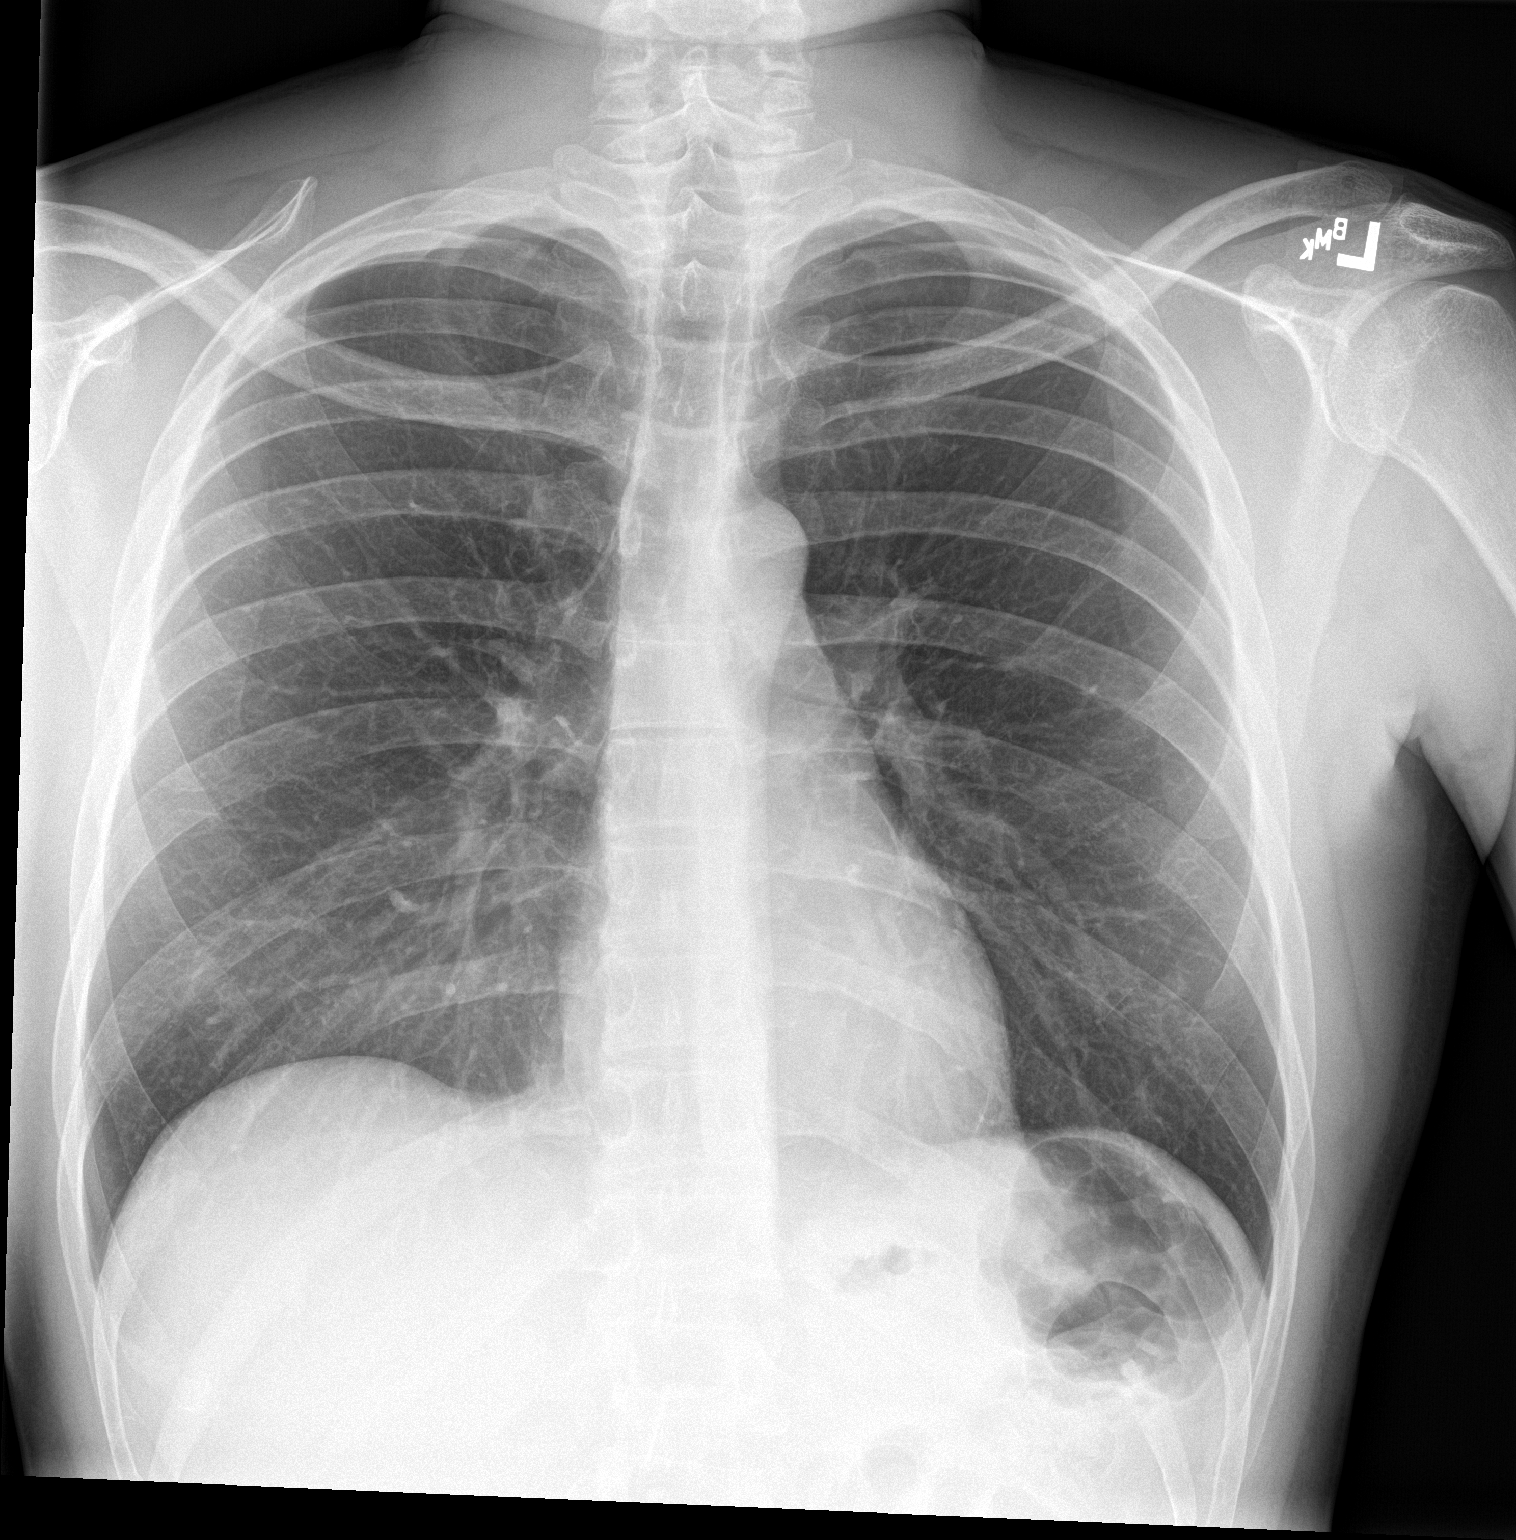

[chest lat]
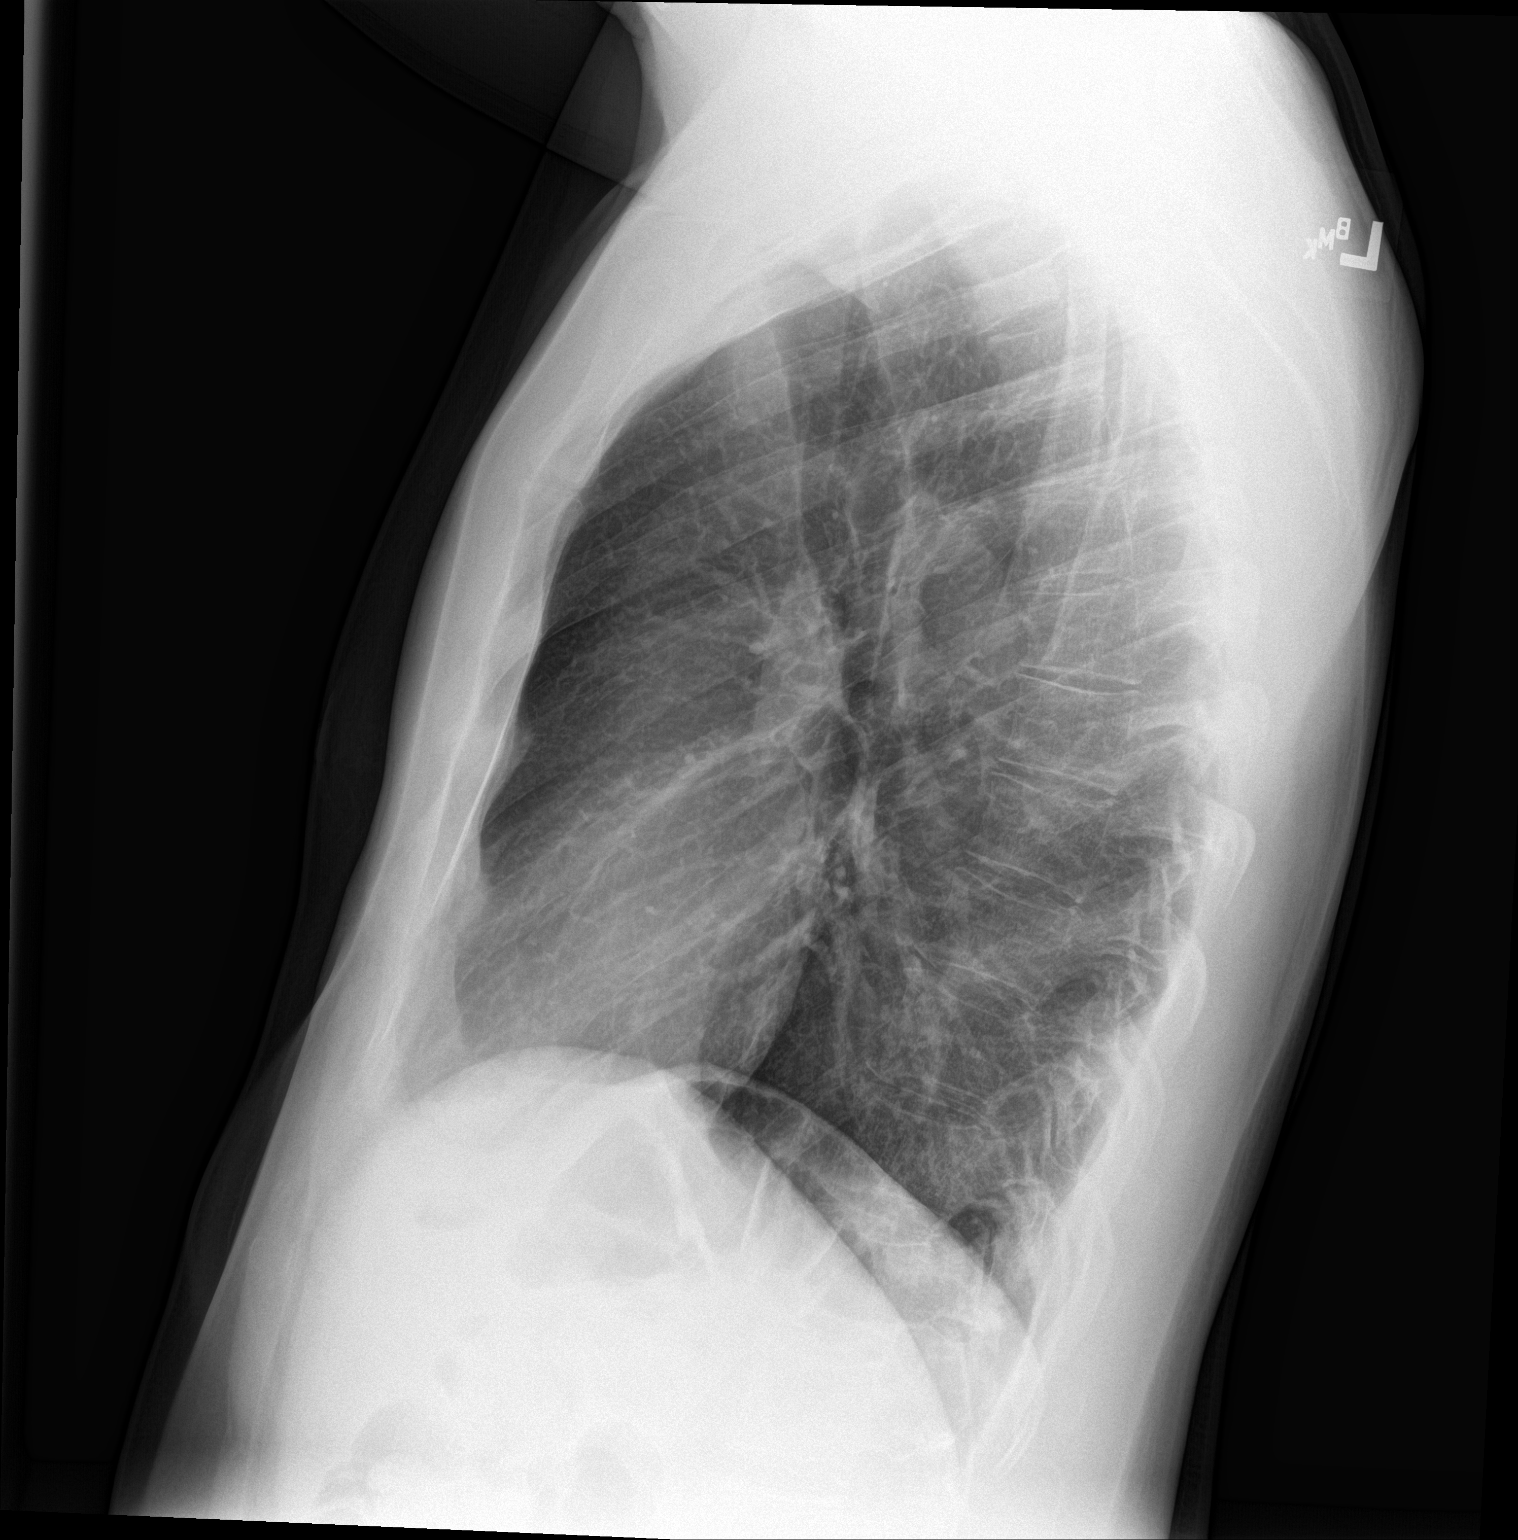

[2 of 2 positions shown; findings below may reference images not displayed]

FINDINGS: The cardiomediastinal silhouette is within normal limits. The lungs
are well inflated and clear. There is no evidence of pleural
effusion or pneumothorax. No acute osseous abnormality is
identified.
IMPRESSION: No active cardiopulmonary disease.

## 2017-03-27 ENCOUNTER — Emergency Department
Admission: EM | Admit: 2017-03-27 | Discharge: 2017-03-28 | Disposition: A | Discharge status: Home / Self Care | Payer: Medicare Other | Attending: Emergency Medicine | Admitting: Emergency Medicine

## 2017-03-27 ENCOUNTER — Encounter: Payer: Self-pay | Admitting: Emergency Medicine

## 2017-03-27 DIAGNOSIS — Z79899 Other long term (current) drug therapy: Secondary | ICD-10-CM | POA: Insufficient documentation

## 2017-03-27 DIAGNOSIS — F250 Schizoaffective disorder, bipolar type: Secondary | ICD-10-CM | POA: Diagnosis not present

## 2017-03-27 DIAGNOSIS — F1290 Cannabis use, unspecified, uncomplicated: Secondary | ICD-10-CM | POA: Diagnosis not present

## 2017-03-27 DIAGNOSIS — F17210 Nicotine dependence, cigarettes, uncomplicated: Secondary | ICD-10-CM | POA: Diagnosis not present

## 2017-03-27 DIAGNOSIS — F209 Schizophrenia, unspecified: Secondary | ICD-10-CM

## 2017-03-27 DIAGNOSIS — J45909 Unspecified asthma, uncomplicated: Secondary | ICD-10-CM | POA: Insufficient documentation

## 2017-03-27 DIAGNOSIS — R440 Auditory hallucinations: Secondary | ICD-10-CM | POA: Diagnosis present

## 2017-03-27 DIAGNOSIS — F1220 Cannabis dependence, uncomplicated: Secondary | ICD-10-CM | POA: Diagnosis present

## 2017-03-27 LAB — COMPREHENSIVE METABOLIC PANEL
ALBUMIN: 4.2 g/dL (ref 3.5–5.0)
ALK PHOS: 59 U/L (ref 38–126)
ALT: 12 U/L — ABNORMAL LOW (ref 17–63)
ANION GAP: 5 (ref 5–15)
AST: 34 U/L (ref 15–41)
BILIRUBIN TOTAL: 0.4 mg/dL (ref 0.3–1.2)
BUN: 9 mg/dL (ref 6–20)
CALCIUM: 9.3 mg/dL (ref 8.9–10.3)
CHLORIDE: 107 mmol/L (ref 101–111)
CO2: 27 mmol/L (ref 22–32)
Creatinine, Ser: 0.87 mg/dL (ref 0.61–1.24)
GFR calc Af Amer: >60 mL/min (ref >60)
GFR calc non Af Amer: >60 mL/min (ref >60)
GLUCOSE: 136 mg/dL — AB (ref 65–99)
POTASSIUM: 3.8 mmol/L (ref 3.5–5.1)
Sodium: 139 mmol/L (ref 135–145)
Total Protein: 7.6 g/dL (ref 6.5–8.1)

## 2017-03-27 LAB — URINE DRUG SCREEN, QUALITATIVE (ARMC ONLY)
AMPHETAMINES, UR SCREEN: NOT DETECTED
Barbiturates, Ur Screen: NOT DETECTED
Benzodiazepine, Ur Scrn: NOT DETECTED
COCAINE METABOLITE, UR ~~LOC~~: NOT DETECTED
Cannabinoid 50 Ng, Ur ~~LOC~~: POSITIVE — AB
MDMA (Ecstasy)Ur Screen: NOT DETECTED
METHADONE SCREEN, URINE: NOT DETECTED
OPIATE, UR SCREEN: NOT DETECTED
Phencyclidine (PCP) Ur S: NOT DETECTED
TRICYCLIC, UR SCREEN: NOT DETECTED

## 2017-03-27 LAB — CBC
HEMATOCRIT: 35.2 % — AB (ref 40.0–52.0)
HEMOGLOBIN: 11.7 g/dL — AB (ref 13.0–18.0)
MCH: 28.8 pg (ref 26.0–34.0)
MCHC: 33.2 g/dL (ref 32.0–36.0)
MCV: 86.7 fL (ref 80.0–100.0)
Platelets: 298 10*3/uL (ref 150–440)
RBC: 4.06 MIL/uL — AB (ref 4.40–5.90)
RDW: 14.5 % (ref 11.5–14.5)
WBC: 6.7 10*3/uL (ref 3.8–10.6)

## 2017-03-27 LAB — ACETAMINOPHEN LEVEL

## 2017-03-27 LAB — ETHANOL: Alcohol, Ethyl (B): <5 mg/dL (ref <5)

## 2017-03-27 LAB — SALICYLATE LEVEL

## 2017-03-27 NOTE — ED Notes (Signed)
Pt. To BHU from ED ambulatory without difficulty, to room  BHU5. Report from [employer]. Pt. Is alert and oriented, warm and dry in no distress. Pt. Denies SI, HI.  Pt reports hearing voices. Patient denies seeing any hallucinations.  Pt. Calm and cooperative. Pt. Made aware of security cameras and Q15 minute rounds. Pt. Encouraged to let Nursing staff know of any concerns or needs.

## 2017-03-27 NOTE — ED Notes (Signed)

## 2017-03-27 NOTE — ED Provider Notes (Signed)
[facility] Emergency Department Provider Note  Time seen: 10:08 PM  I have reviewed the triage vital signs and the nursing notes.   HISTORY  Chief Complaint Mental Health Problem    HPI Peter Wilson is a 41 y.o. male with a past medical history of schizophrenia, marijuana use, who presents to the emergency department for drug use and hearing voices. According to the patient he has not been taking any of his medications for the past 3 months. States he has been smoking a lot of weed, admits to being very "high." When asked why he is here the patient is a states I just needed a vacation so they came to the ER. He then goes on to say that he is also been hearing voices for the past several days. Says he hears them laughing at people. Denies any visual hallucinations. Denies any SI or HI. Denies alcohol or other drug use.  Past Medical History:  Diagnosis Date  . Marijuana abuse   . Schizophrenia, paranoid ([facility])   . Tardive dyskinesia   . Tobacco abuse     Patient Active Problem List   Diagnosis Date Noted  . Asthma 07/13/2016  . Schizoaffective disorder, bipolar type ([facility]) 06/27/2016  . Tardive dyskinesia 09/15/2015  . Antisocial traits 09/15/2015  . Tobacco use disorder 09/14/2015  . Cannabis use disorder, moderate, dependence ([facility]) 09/14/2015    Past Surgical History:  Procedure Laterality Date  . NO PAST SURGERIES      Prior to Admission medications   Medication Sig Start Date End Date Taking? Authorizing Provider  albuterol (PROVENTIL) (2.5 MG/3ML) 0.083% nebulizer solution Inhale 3 mLs into the lungs every 4 (four) hours as needed for wheezing or shortness of breath. 07/13/16   Audery Amel, MD  amantadine (SYMMETREL) 100 MG capsule Take 1 capsule (100 mg total) by mouth 2 (two) times daily. 07/13/16   Audery Amel, MD  divalproex (DEPAKOTE ER) 500 MG 24 hr tablet Take 2 tablets (1,000 mg total) by mouth at bedtime. 07/13/16   Audery Amel, MD   haloperidol decanoate (HALDOL DECANOATE) 100 MG/ML injection Inject 2.5 mLs (250 mg total) into the muscle every 30 (thirty) days. 07/13/16   Audery Amel, MD  OLANZapine (ZYPREXA) 15 MG tablet Take 2 tablets (30 mg total) by mouth at bedtime. 07/13/16   Audery Amel, MD  sucralfate (CARAFATE) 1 g tablet Take 1 tablet (1 g total) by mouth 2 (two) times daily. 07/15/16   Rebecka Apley, MD    Allergies  Allergen Reactions  . Penicillins Rash    No family history on file.  Social History Social History  Substance Use Topics  . Smoking status: Current Every Day Smoker    Packs/day: 2.00    Types: Cigarettes  . Smokeless tobacco: Never Used  . Alcohol use 0.0 oz/week    Review of Systems Constitutional: Negative for fever. Cardiovascular: Negative for chest pain. Respiratory: Negative for shortness of breath. Gastrointestinal: Negative for abdominal pain Neurological: Negative for headache 10-point ROS otherwise negative.  ____________________________________________   PHYSICAL EXAM:  VITAL SIGNS: ED Triage Vitals [03/27/17 2059]  Enc Vitals Group     BP 106/78     Pulse Rate (!) 104     Resp 18     Temp 97.7 F (36.5 C)     Temp Source Oral     SpO2 95 %     Weight 188 lb (85.3 kg)     Height  6' (1.829 m)     Head Circumference      Peak Flow      Pain Score      Pain Loc      Pain Edu?      Excl. in GC?     Constitutional: Alert and oriented. Well appearing and in no distress. Eyes: Conjunctival injection ENT   Head: Normocephalic and atraumatic   Mouth/Throat: Mucous membranes are moist. Cardiovascular: Normal rate, regular rhythm. No murmur Respiratory: Normal respiratory effort without tachypnea nor retractions. Breath sounds are clear  Gastrointestinal: Soft and nontender. No distention.   Musculoskeletal: Nontender with normal range of motion in all extremities. Neurologic:  Normal speech and language. No gross focal neurologic  deficits Skin:  Skin is warm, dry and intact.  Psychiatric: Mood and affect are normal.  ____________________________________________    INITIAL IMPRESSION / ASSESSMENT AND PLAN / ED COURSE  Pertinent labs & imaging results that were available during my care of the patient were reviewed by me and considered in my medical decision making (see chart for details).  The patient presents the emergency department from his group facility. Initially the patient states he is just very high and wanted a vacation so came to the ER. Upon further questioning he says he has been hearing voices. Patient denies SI or HI. Throughout the exam he keeps covering his head with a blanket trying to go to sleep. Patient is here voluntarily. We will keep the patient voluntarily have psych see him if he wishes. Patient has no medical complaints tonight with a normal exam. Patient's labs are normal besides positive cannabinoids.  ____________________________________________   FINAL CLINICAL IMPRESSION(S) / ED DIAGNOSES  Substance use disorder Auditory hallucinations    Minna Antis, MD 03/27/17 2213

## 2017-03-27 NOTE — ED Triage Notes (Signed)
Patient ambulatory to triage with steady gait, without difficulty or distress noted; pt reports "I started not trusting my case workers and stuff, smoked so much week that I feel like I want to go on vacation, that's why I came to the psychiatric ward but I'm not gonna talk about it right now"; denies SI or HI

## 2017-03-27 NOTE — BH Assessment (Signed)
Assessment Note  Peter Wilson is an 41 y.o. male, with a history of schizophrenia and cannabis abuse, presenting to the ED with concerns of auditory hallucinations and cannabis use.  Pt reports he has been smoking so much weed to the point where he states "I need to come to the hospital for a vacation to come down of this high".  Pt reports he smokes weed to stop the voices talking about him.  Pt denies HI/SI.  He also reports that he has not taken his medications and has not been in touch with his ACT team because he does not trust his caseworker.  Pt reports that he smokes $124 worth of "loud" everyday.  Diagnosis: Schizophrenia, Cannabis use  Past Medical History:  Past Medical History:  Diagnosis Date  . Marijuana abuse   . Schizophrenia, paranoid (HCC)   . Tardive dyskinesia   . Tobacco abuse     Past Surgical History:  Procedure Laterality Date  . NO PAST SURGERIES      Family History: No family history on file.  Social History:  reports that he has been smoking Cigarettes.  He has been smoking about 2.00 packs per day. He has never used smokeless tobacco. He reports that he drinks alcohol. He reports that he uses drugs, including Marijuana.  Additional Social History:  Alcohol / Drug Use Pain Medications: See PTA Prescriptions: See PTA Over the Counter: See PTA History of alcohol / drug use?: Yes Negative Consequences of Use: Personal relationships, Legal Substance #1 Name of Substance 1: cannabis 1 - Age of First Use: "don't remember" 1 - Amount (size/oz): blunt 1 - Frequency: everyday 1 - Duration: varies 1 - Last Use / Amount: today  CIWA: CIWA-Ar BP: 125/82 Pulse Rate: 61 COWS:    Allergies:  Allergies  Allergen Reactions  . Penicillins Rash    Home Medications:  (Not in a hospital admission)  OB/GYN Status:  No LMP for male patient.  General Assessment Data Location of Assessment: [facility] ED TTS Assessment: In system Is this a Tele or Face-to-Face  Assessment?: Face-to-Face Is this an Initial Assessment or a Re-assessment for this encounter?: Initial Assessment Marital status: Single Is patient pregnant?: No Pregnancy Status: No Living Arrangements: Alone Can pt return to current living arrangement?: Yes Admission Status: Voluntary Is patient capable of signing voluntary admission?: Yes Referral Source: Self/Family/Friend Insurance type: Medicare     Crisis Care Plan Living Arrangements: Alone Legal Guardian: Other: (self) Name of Psychiatrist: [facility] Name of Therapist: [facility]  Education Status Is patient currently in school?: No Current Grade: na Highest grade of school patient has completed: na Name of school: na Contact person: na  Risk to self with the past 6 months Suicidal Ideation: No Has patient been a risk to self within the past 6 months prior to admission? : No Suicidal Intent: No Has patient had any suicidal intent within the past 6 months prior to admission? : No Is patient at risk for suicide?: No Suicidal Plan?: No Has patient had any suicidal plan within the past 6 months prior to admission? : No Access to Means: No What has been your use of drugs/alcohol within the last 12 months?: cannabis Previous Attempts/Gestures: No How many times?: 0 Other Self Harm Risks: none identified Triggers for Past Attempts: None known Intentional Self Injurious Behavior: None Family Suicide History: No Recent stressful life event(s): Other (Comment) Persecutory voices/beliefs?: No Depression: Yes Depression Symptoms: Feeling angry/irritable, Loss of interest in usual pleasures Substance  abuse history and/or treatment for substance abuse?: Yes (Pt sts "I use about $300 of pot a week." ) Suicide prevention information given to non-admitted patients: Not applicable  Risk to Others within the past 6 months Homicidal Ideation: No Does patient have any lifetime risk of violence  toward others beyond the six months prior to admission? : No Thoughts of Harm to Others: No Current Homicidal Intent: No Current Homicidal Plan: No Access to Homicidal Means: No Identified Victim: na History of harm to others?: No Assessment of Violence: None Noted Violent Behavior Description: none identified Does patient have access to weapons?: No Criminal Charges Pending?: No Does patient have a court date: No Is patient on probation?: No  Psychosis Hallucinations: Visual Delusions: None noted  Mental Status Report Appearance/Hygiene: In scrubs Eye Contact: Good Motor Activity: Hyperactivity (Simultaneous filing. User may not have seen previous data.) Speech: Logical/coherent, Aggressive Level of Consciousness: Alert, Irritable Mood: Irritable Affect: Irritable Anxiety Level: Minimal Thought Processes: Relevant Judgement: Impaired Orientation: Person, Place, Time, Situation Obsessive Compulsive Thoughts/Behaviors: Minimal  Cognitive Functioning Concentration: Good Memory: Recent Intact, Remote Intact IQ: Average Insight: Poor Impulse Control: Poor Appetite: Good Weight Loss: 0 Weight Gain: 0 Sleep: No Change Vegetative Symptoms: None  ADLScreening [facility]) Patient's cognitive ability adequate to safely complete daily activities?: Yes Patient able to express need for assistance with ADLs?: Yes Independently performs ADLs?: Yes (appropriate for developmental age)  Prior Inpatient Therapy Prior Inpatient Therapy: Yes Prior Therapy Dates: 2017 Prior Therapy Facilty/Provider(s): [facility] Reason for Treatment: suicidal  Prior Outpatient Therapy Prior Outpatient Therapy: Yes Prior Therapy Dates: current Prior Therapy Facilty/Provider(s): [facility] Reason for Treatment: schizophrenia Does patient have an ACCT team?: Yes Does patient have Intensive In-House Services?  : No Does patient have [facility] services? : No Does patient have  P4CC services?: No  ADL Screening (condition at time of admission) Patient's cognitive ability adequate to safely complete daily activities?: Yes Patient able to express need for assistance with ADLs?: Yes Independently performs ADLs?: Yes (appropriate for developmental age)       Abuse/Neglect Assessment (Assessment to be complete while patient is alone) Physical Abuse: Denies Verbal Abuse: Denies Sexual Abuse: Denies Exploitation of patient/patient's resources: Denies Self-Neglect: Denies Values / Beliefs Cultural Requests During Hospitalization: None Spiritual Requests During Hospitalization: None Consults Spiritual Care Consult Needed: No Social Work Consult Needed: No Merchant navy officer (For Healthcare) Does Patient Have a Medical Advance Directive?: No Would patient like information on creating a medical advance directive?: No - Patient declined    Additional Information 1:1 In Past 12 Months?: No CIRT Risk: No Elopement Risk: No Does patient have medical clearance?: Yes     Disposition:  Disposition Initial Assessment Completed for this Encounter: Yes Disposition of Patient: Other dispositions Other disposition(s): Other (Comment) (Pending Psych MD consult)  On Site Evaluation by:   Reviewed with Physician:    Artist [place] 03/27/2017 11:23 PM

## 2017-03-28 DIAGNOSIS — F209 Schizophrenia, unspecified: Secondary | ICD-10-CM | POA: Diagnosis not present

## 2017-03-28 DIAGNOSIS — F250 Schizoaffective disorder, bipolar type: Secondary | ICD-10-CM | POA: Diagnosis not present

## 2017-03-28 MED ORDER — HALOPERIDOL DECANOATE 100 MG/ML IM SOLN
250.0000 mg | Freq: Once | INTRAMUSCULAR | Status: AC
  Administered 2017-03-28: 250 mg via INTRAMUSCULAR
  Filled 2017-03-28: qty 2.5

## 2017-03-28 NOTE — Consult Note (Signed)
[name]   Reason for Consult:  Consult for 41 year old man with a history of schizoaffective disorder came to the emergency room voluntarily Referring Physician:  Owens Shark Patient Identification: Peter Wilson MRN:  [id number] Principal Diagnosis: Schizoaffective disorder, bipolar type [facility]) Diagnosis:   Patient Active Problem List   Diagnosis Date Noted  . Asthma [J45.909] 07/13/2016  . Schizoaffective disorder, bipolar type ([facility]) [F25.0] 06/27/2016  . Tardive dyskinesia [G24.01] 09/15/2015  . Antisocial traits [F60.2] 09/15/2015  . Tobacco use disorder [F17.200] 09/14/2015  . Cannabis use disorder, moderate, dependence ([place]) [F12.20] 09/14/2015    Total Time spent with patient: 1 hour  Subjective:   Peter Wilson is a 41 y.o. male patient admitted with "I was late for my shot".  HPI:  Patient interviewed chart reviewed. 41 year old man presented voluntarily yesterday evening saying that he is been more paranoid recently. Feeling nervous feeling like people are out to get him having some hallucinations. He tells me that he is feeling better today and that he thinks it was simply that he was late getting a shot. He was away from home and missed visits from his act team and so is several days may be a week late on getting his Haldol decanoate shot. Patient says today he is not having active hallucinations. His mood is feeling more stable. Denies suicidal or homicidal ideation. Admits that he has been only intermittently compliant with other medicines and that he uses marijuana a few times a week although not other drugs. Patient presents as calm and appropriate and is requesting to have his Haldol decanoate shot now.  Social history: Currently living with his sister and her boyfriend. He gets disability. 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  Medical history:  History of asthma mild. History of tardive dyskinesia which does not appear any worse currently than it was before.  Substance abuse history: Long-standing history of intermittent marijuana use. Patient does not acknowledge it as a problem and has no interest in changing his behavior. Denies other drug or alcohol use.  Past Psychiatric History: Patient has a long-standing diagnosis of schizophrenia or schizoaffective disorder and is on multiple medications including Depakote and at least one other antipsychotic and a Haldol decanoate shot. We are most familiar with him in the emergency room from several visits that were almost identical to what he is presenting with today where he comes in with some increase in his psychotic symptoms but not necessarily dangerous. Usually after resting and may be getting his shot he is ready to go. Denies any past history of suicide attempts. Currently hasn't act team that follows him.  Risk to Self: Suicidal Ideation: No Suicidal Intent: No Is patient at risk for suicide?: No Suicidal Plan?: No Access to Means: No What has been your use of drugs/alcohol within the last 12 months?: cannabis How many times?: 0 Other Self Harm Risks: none identified Triggers for Past Attempts: None known Intentional Self Injurious Behavior: None Risk to Others: Homicidal Ideation: No Thoughts of Harm to Others: No Current Homicidal Intent: No Current Homicidal Plan: No Access to Homicidal Means: No Identified Victim: na History of harm to others?: No Assessment of Violence: None Noted Violent Behavior Description: none identified Does patient have access to weapons?: No Criminal Charges Pending?: No Does patient have a court date: No Prior Inpatient Therapy: Prior Inpatient Therapy: Yes Prior Therapy Dates:  2017 Prior Therapy Facilty/Provider(s): [facility] Reason for Treatment: suicidal Prior Outpatient Therapy: Prior Outpatient Therapy: Yes Prior Therapy Dates:  current Prior Therapy Facilty/Provider(s): [facility] Reason for Treatment: schizophrenia Does patient have an ACCT team?: Yes Does patient have Intensive In-House Services?  : No Does patient have [facility] services? : No Does patient have P4CC services?: No  Past Medical History:  Past Medical History:  Diagnosis Date  . Marijuana abuse   . Schizophrenia, paranoid ([place])   . Tardive dyskinesia   . Tobacco abuse     Past Surgical History:  Procedure Laterality Date  . NO PAST SURGERIES     Family History: No family history on file. Family Psychiatric  History: No known family history Social History:  History  Alcohol Use  . 0.0 oz/week     History  Drug Use  . Types: Marijuana    Social History   Social History  . Marital status: Single    Spouse name: N/A  . Number of children: N/A  . Years of education: N/A   Social History Main Topics  . Smoking status: Current Every Day Smoker    Packs/day: 2.00    Types: Cigarettes  . Smokeless tobacco: Never Used  . Alcohol use 0.0 oz/week  . Drug use: Yes    Types: Marijuana  . Sexual activity: Not Asked   Other Topics Concern  . None   Social History Narrative  . None   Additional Social History:    Allergies:   Allergies  Allergen Reactions  . Penicillins Rash    Labs:  Results for orders placed or performed during the hospital encounter of 03/27/17 (from the past 48 hour(s))  Comprehensive metabolic panel     Status: Abnormal   Collection Time: 03/27/17  9:00 PM  Result Value Ref Range   Sodium 139 135 - 145 mmol/L   Potassium 3.8 3.5 - 5.1 mmol/L   Chloride 107 101 - 111 mmol/L   CO2 27 22 - 32 mmol/L   Glucose, Bld 136 (H) 65 - 99 mg/dL   BUN 9 6 - 20 mg/dL   Creatinine, Ser 0.87 0.61 - 1.24 mg/dL   Calcium 9.3 8.9 - 10.3 mg/dL   Total Protein 7.6 6.5 - 8.1 g/dL   Albumin 4.2 3.5 - 5.0 g/dL   AST 34 15 - 41 U/L   ALT 12 (L) 17 - 63 U/L   Alkaline Phosphatase 59 38 - 126 U/L    Total Bilirubin 0.4 0.3 - 1.2 mg/dL   GFR calc non Af Amer >60 >60 mL/min   GFR calc Af Amer >60 >60 mL/min    Comment: (NOTE) The eGFR has been calculated using the CKD EPI equation. This calculation has not been validated in all clinical situations. eGFR's persistently <60 mL/min signify possible Chronic Kidney Disease.    Anion gap 5 5 - 15  Ethanol     Status: None   Collection Time: 03/27/17  9:00 PM  Result Value Ref Range   Alcohol, Ethyl (B) <5 <5 mg/dL    Comment:        LOWEST DETECTABLE LIMIT FOR SERUM ALCOHOL IS 5 mg/dL FOR MEDICAL PURPOSES ONLY   Salicylate level     Status: None   Collection Time: 03/27/17  9:00 PM  Result Value Ref Range   Salicylate Lvl <7.4 2.8 - 30.0 mg/dL  Acetaminophen level     Status: Abnormal   Collection Time: 03/27/17  9:00 PM  Result Value Ref  Range   Acetaminophen (Tylenol), Serum <10 (L) 10 - 30 ug/mL    Comment:        THERAPEUTIC CONCENTRATIONS VARY SIGNIFICANTLY. A RANGE OF 10-30 ug/mL MAY BE AN EFFECTIVE CONCENTRATION FOR MANY PATIENTS. HOWEVER, SOME ARE BEST TREATED AT CONCENTRATIONS OUTSIDE THIS RANGE. ACETAMINOPHEN CONCENTRATIONS >150 ug/mL AT 4 HOURS AFTER INGESTION AND >50 ug/mL AT 12 HOURS AFTER INGESTION ARE OFTEN ASSOCIATED WITH TOXIC REACTIONS.   cbc     Status: Abnormal   Collection Time: 03/27/17  9:00 PM  Result Value Ref Range   WBC 6.7 3.8 - 10.6 K/uL   RBC 4.06 (L) 4.40 - 5.90 MIL/uL   Hemoglobin 11.7 (L) 13.0 - 18.0 g/dL   HCT 35.2 (L) 40.0 - 52.0 %   MCV 86.7 80.0 - 100.0 fL   MCH 28.8 26.0 - 34.0 pg   MCHC 33.2 32.0 - 36.0 g/dL   RDW 14.5 11.5 - 14.5 %   Platelets 298 150 - 440 K/uL  Urine Drug Screen, Qualitative     Status: Abnormal   Collection Time: 03/27/17  9:00 PM  Result Value Ref Range   Tricyclic, Ur Screen NONE DETECTED NONE DETECTED   Amphetamines, Ur Screen NONE DETECTED NONE DETECTED   MDMA (Ecstasy)Ur Screen NONE DETECTED NONE DETECTED   Cocaine Metabolite,Ur [place] NONE DETECTED  NONE DETECTED   Opiate, Ur Screen NONE DETECTED NONE DETECTED   Phencyclidine (PCP) Ur S NONE DETECTED NONE DETECTED   Cannabinoid 50 Ng, Ur [place] POSITIVE (A) NONE DETECTED   Barbiturates, Ur Screen NONE DETECTED NONE DETECTED   Benzodiazepine, Ur Scrn NONE DETECTED NONE DETECTED   Methadone Scn, Ur NONE DETECTED NONE DETECTED    Comment: (NOTE) 656  Tricyclics, urine               Cutoff 1000 ng/mL 200  Amphetamines, urine             Cutoff 1000 ng/mL 300  MDMA (Ecstasy), urine           Cutoff 500 ng/mL 400  Cocaine Metabolite, urine       Cutoff 300 ng/mL 500  Opiate, urine                   Cutoff 300 ng/mL 600  Phencyclidine (PCP), urine      Cutoff 25 ng/mL 700  Cannabinoid, urine              Cutoff 50 ng/mL 800  Barbiturates, urine             Cutoff 200 ng/mL 900  Benzodiazepine, urine           Cutoff 200 ng/mL 1000 Methadone, urine                Cutoff 300 ng/mL 1100 1200 The urine drug screen provides only a preliminary, unconfirmed 1300 analytical test result and should not be used for non-medical 1400 purposes. Clinical consideration and professional judgment should 1500 be applied to any positive drug screen result due to possible 1600 interfering substances. A more specific alternate chemical method 1700 must be used in order to obtain a confirmed analytical result.  1800 Gas chromato graphy / mass spectrometry (GC/MS) is the preferred 1900 confirmatory method.     Current Facility-Administered Medications  Medication Dose Route Frequency Provider Last Rate Last Dose  . haloperidol decanoate (HALDOL DECANOATE) 100 MG/ML injection 250 mg  250 mg Intramuscular Once Gonzella Lex, MD  Current Outpatient Prescriptions  Medication Sig Dispense Refill  . albuterol (PROVENTIL) (2.5 MG/3ML) 0.083% nebulizer solution Inhale 3 mLs into the lungs every 4 (four) hours as needed for wheezing or shortness of breath. 75 mL 1  . amantadine (SYMMETREL) 100 MG capsule Take 1  capsule (100 mg total) by mouth 2 (two) times daily. 60 capsule 1  . divalproex (DEPAKOTE ER) 500 MG 24 hr tablet Take 2 tablets (1,000 mg total) by mouth at bedtime. 60 tablet 1  . haloperidol decanoate (HALDOL DECANOATE) 100 MG/ML injection Inject 2.5 mLs (250 mg total) into the muscle every 30 (thirty) days. 2.5 mL 1  . OLANZapine (ZYPREXA) 15 MG tablet Take 2 tablets (30 mg total) by mouth at bedtime. 60 tablet 1  . sucralfate (CARAFATE) 1 g tablet Take 1 tablet (1 g total) by mouth 2 (two) times daily. 20 tablet 0    Musculoskeletal: Strength & Muscle Tone: within normal limits Gait & Station: normal Patient leans: N/A  Psychiatric Specialty Exam: Physical Exam  Nursing note and vitals reviewed. Constitutional: He appears well-developed and well-nourished.  HENT:  Head: Normocephalic and atraumatic.  Eyes: Conjunctivae are normal. Pupils are equal, round, and reactive to light.  Neck: Normal range of motion.  Cardiovascular: Regular rhythm and normal heart sounds.   Respiratory: Effort normal. No respiratory distress.  GI: Soft.  Musculoskeletal: Normal range of motion.  Neurological: He is alert.  Skin: Skin is warm and dry.  Psychiatric: His affect is blunt. His speech is delayed. He is slowed. Thought content is not paranoid. Cognition and memory are normal. He expresses impulsivity. He expresses no homicidal and no suicidal ideation.    Review of Systems  Constitutional: Negative.   HENT: Negative.   Eyes: Negative.   Respiratory: Negative.   Cardiovascular: Negative.   Gastrointestinal: Negative.   Musculoskeletal: Negative.   Skin: Negative.   Neurological: Negative.   Psychiatric/Behavioral: Positive for hallucinations and substance abuse. Negative for depression, memory loss and suicidal ideas. The patient is nervous/anxious and has insomnia.     Blood pressure 106/66, pulse (!) 55, temperature 97.7 F (36.5 C), temperature source Oral, resp. rate 18, height 6'  (1.829 m), weight 85.3 kg (188 lb), SpO2 98 %.Body mass index is 25.5 kg/m.  General Appearance: Fairly Groomed  Eye Contact:  Good  Speech:  Normal Rate  Volume:  Decreased  Mood:  Dysphoric  Affect:  Blunt  Thought Process:  Goal Directed  Orientation:  Full (Time, Place, and Person)  Thought Content:  Hallucinations: Auditory and Rumination  Suicidal Thoughts:  No  Homicidal Thoughts:  No  Memory:  Immediate;   Good Recent;   Fair Remote;   Fair  Judgement:  Fair  Insight:  Fair  Psychomotor Activity:  Decreased  Concentration:  Concentration: Fair  Recall:  [employer] of Knowledge:  Fair  Language:  Fair  Akathisia:  No  Handed:  Right  AIMS (if indicated):     Assets:  Communication Skills Desire for Improvement Financial Resources/Insurance Physical Health Resilience Social Support  ADL's:  Intact  Cognition:  Impaired,  Mild  Sleep:        Treatment Plan Summary: Daily contact with patient to assess and evaluate symptoms and progress in treatment, Medication management and Plan 41 year old man with schizoaffective disorder. Voluntary. Denies any suicidal thought or intent or any homicidal ideation. He is not aggressive violent or inappropriate here. Has insight into the need to be on his medicine. He is requesting  to get his Haldol Decanoate shot. Patient does not appear to need inpatient hospital level treatment. Talk with him about how he can work with his treatment team to find a regimen of medicine that will be satisfactory to him and that he can be more compliant with. We will give him is 250 mg Haldol Decanoate shot today at his request and then he can be released from the emergency room to follow up as an outpatient.  Disposition: Patient does not meet criteria for psychiatric inpatient admission. Supportive therapy provided about ongoing stressors.  Alethia Berthold, MD 03/28/2017 2:01 PM

## 2017-03-28 NOTE — ED Provider Notes (Signed)
Vitals:   03/27/17 2156 03/28/17 0559  BP: 125/82 106/66  Pulse: 61 (!) 55  Resp: 18 18  Temp:  97.7 F (36.5 C)   No acute events reported to me overnight upon physician sign out.  I reviewed the TTS note and ED note from yesterday - group home patient with drug abuse and hearing voices.  Voluntary for psych eval today.  Dispo per psychiatry.   Governor Rooks, MD 03/28/17 [phone]

## 2017-03-28 NOTE — Discharge Instructions (Signed)
You were given haldol injection in the ER.  Return in the ER for any worsening condition including depression, hearing voices, or thoughts of wanting to hurt yourself or others.

## 2017-03-28 NOTE — ED Provider Notes (Signed)
I spoke with Dr. Toni Amend, psychiatry he ordered haldol and recommends release.  I completed his discharge instructions.   Governor Rooks, MD 03/28/17 1550

## 2017-03-28 NOTE — ED Notes (Signed)
Pt refused to sign electronic signature or take dc with him . But left with no other c/o or distress

## 2017-04-09 ENCOUNTER — Emergency Department
Admission: EM | Admit: 2017-04-09 | Discharge: 2017-04-10 | Disposition: A | Discharge status: Other Acute Inpatient Hospital | Payer: Medicare Other | Attending: Emergency Medicine | Admitting: Emergency Medicine

## 2017-04-09 ENCOUNTER — Encounter: Payer: Self-pay | Admitting: Emergency Medicine

## 2017-04-09 DIAGNOSIS — F250 Schizoaffective disorder, bipolar type: Secondary | ICD-10-CM | POA: Diagnosis present

## 2017-04-09 DIAGNOSIS — F1220 Cannabis dependence, uncomplicated: Secondary | ICD-10-CM | POA: Diagnosis present

## 2017-04-09 DIAGNOSIS — Y939 Activity, unspecified: Secondary | ICD-10-CM | POA: Insufficient documentation

## 2017-04-09 DIAGNOSIS — Y92410 Unspecified street and highway as the place of occurrence of the external cause: Secondary | ICD-10-CM | POA: Insufficient documentation

## 2017-04-09 DIAGNOSIS — Y999 Unspecified external cause status: Secondary | ICD-10-CM | POA: Insufficient documentation

## 2017-04-09 DIAGNOSIS — M542 Cervicalgia: Secondary | ICD-10-CM | POA: Insufficient documentation

## 2017-04-09 DIAGNOSIS — R45850 Homicidal ideations: Secondary | ICD-10-CM | POA: Insufficient documentation

## 2017-04-09 DIAGNOSIS — Z91199 Patient's noncompliance with other medical treatment and regimen due to unspecified reason: Secondary | ICD-10-CM

## 2017-04-09 DIAGNOSIS — F17210 Nicotine dependence, cigarettes, uncomplicated: Secondary | ICD-10-CM | POA: Insufficient documentation

## 2017-04-09 DIAGNOSIS — R51 Headache: Secondary | ICD-10-CM | POA: Insufficient documentation

## 2017-04-09 DIAGNOSIS — J45909 Unspecified asthma, uncomplicated: Secondary | ICD-10-CM | POA: Insufficient documentation

## 2017-04-09 DIAGNOSIS — Z9119 Patient's noncompliance with other medical treatment and regimen: Secondary | ICD-10-CM

## 2017-04-09 LAB — CBC WITH DIFFERENTIAL/PLATELET
BASOS PCT: 1 %
Basophils Absolute: 0.1 10*3/uL (ref 0–0.1)
Eosinophils Absolute: 0.1 10*3/uL (ref 0–0.7)
Eosinophils Relative: 2 %
HEMATOCRIT: 40.9 % (ref 40.0–52.0)
Hemoglobin: 13.2 g/dL (ref 13.0–18.0)
Lymphocytes Relative: 34 %
Lymphs Abs: 2 10*3/uL (ref 1.0–3.6)
MCH: 28.4 pg (ref 26.0–34.0)
MCHC: 32.4 g/dL (ref 32.0–36.0)
MCV: 87.6 fL (ref 80.0–100.0)
MONO ABS: 0.4 10*3/uL (ref 0.2–1.0)
MONOS PCT: 7 %
NEUTROS ABS: 3.3 10*3/uL (ref 1.4–6.5)
Neutrophils Relative %: 56 %
Platelets: 332 10*3/uL (ref 150–440)
RBC: 4.67 MIL/uL (ref 4.40–5.90)
RDW: 14.7 % — AB (ref 11.5–14.5)
WBC: 5.8 10*3/uL (ref 3.8–10.6)

## 2017-04-09 LAB — COMPREHENSIVE METABOLIC PANEL
ALBUMIN: 4.8 g/dL (ref 3.5–5.0)
ALT: 10 U/L — ABNORMAL LOW (ref 17–63)
AST: 39 U/L (ref 15–41)
Alkaline Phosphatase: 52 U/L (ref 38–126)
Anion gap: 8 (ref 5–15)
BILIRUBIN TOTAL: 1 mg/dL (ref 0.3–1.2)
BUN: 14 mg/dL (ref 6–20)
CO2: 28 mmol/L (ref 22–32)
Calcium: 9.5 mg/dL (ref 8.9–10.3)
Chloride: 100 mmol/L — ABNORMAL LOW (ref 101–111)
Creatinine, Ser: 0.76 mg/dL (ref 0.61–1.24)
GFR calc Af Amer: >60 mL/min (ref >60)
GFR calc non Af Amer: >60 mL/min (ref >60)
GLUCOSE: 101 mg/dL — AB (ref 65–99)
POTASSIUM: 3.8 mmol/L (ref 3.5–5.1)
Sodium: 136 mmol/L (ref 135–145)
TOTAL PROTEIN: 8.4 g/dL — AB (ref 6.5–8.1)

## 2017-04-09 LAB — ETHANOL: Alcohol, Ethyl (B): <5 mg/dL (ref <5)
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
## 2017-04-10 ENCOUNTER — Inpatient Hospital Stay: Admit: 2017-04-10 | Payer: Medicare Other | Admitting: Psychiatry

## 2017-04-10 ENCOUNTER — Inpatient Hospital Stay
Admit: 2017-04-10 | Discharge: 2017-04-13 | DRG: 885 | Disposition: A | Discharge status: Home / Self Care | Payer: Medicare Other | Attending: Psychiatry | Admitting: Psychiatry

## 2017-04-10 ENCOUNTER — Emergency Department: Payer: Medicare Other

## 2017-04-10 DIAGNOSIS — Z91199 Patient's noncompliance with other medical treatment and regimen due to unspecified reason: Secondary | ICD-10-CM

## 2017-04-10 DIAGNOSIS — F250 Schizoaffective disorder, bipolar type: Secondary | ICD-10-CM | POA: Diagnosis present

## 2017-04-10 DIAGNOSIS — G4700 Insomnia, unspecified: Secondary | ICD-10-CM | POA: Diagnosis present

## 2017-04-10 DIAGNOSIS — F17210 Nicotine dependence, cigarettes, uncomplicated: Secondary | ICD-10-CM | POA: Diagnosis present

## 2017-04-10 DIAGNOSIS — M542 Cervicalgia: Secondary | ICD-10-CM | POA: Diagnosis present

## 2017-04-10 DIAGNOSIS — F602 Antisocial personality disorder: Secondary | ICD-10-CM | POA: Diagnosis present

## 2017-04-10 DIAGNOSIS — F4310 Post-traumatic stress disorder, unspecified: Secondary | ICD-10-CM | POA: Diagnosis present

## 2017-04-10 DIAGNOSIS — Z9119 Patient's noncompliance with other medical treatment and regimen: Secondary | ICD-10-CM

## 2017-04-10 DIAGNOSIS — F1220 Cannabis dependence, uncomplicated: Secondary | ICD-10-CM | POA: Diagnosis present

## 2017-04-10 DIAGNOSIS — Z9114 Patient's other noncompliance with medication regimen: Secondary | ICD-10-CM | POA: Diagnosis not present

## 2017-04-10 DIAGNOSIS — Z79899 Other long term (current) drug therapy: Secondary | ICD-10-CM

## 2017-04-10 DIAGNOSIS — G2401 Drug induced subacute dyskinesia: Secondary | ICD-10-CM | POA: Diagnosis present

## 2017-04-10 DIAGNOSIS — R45851 Suicidal ideations: Secondary | ICD-10-CM | POA: Diagnosis present

## 2017-04-10 DIAGNOSIS — J45909 Unspecified asthma, uncomplicated: Secondary | ICD-10-CM | POA: Diagnosis present

## 2017-04-10 DIAGNOSIS — F17200 Nicotine dependence, unspecified, uncomplicated: Secondary | ICD-10-CM | POA: Diagnosis present

## 2017-04-10 DIAGNOSIS — Z5181 Encounter for therapeutic drug level monitoring: Secondary | ICD-10-CM

## 2017-04-10 LAB — ACETAMINOPHEN LEVEL: Acetaminophen (Tylenol), Serum: <10 ug/mL — ABNORMAL LOW (ref 10–30)

## 2017-04-10 LAB — SALICYLATE LEVEL

## 2017-04-10 MED ORDER — OLANZAPINE 10 MG PO TABS
10.0000 mg | ORAL_TABLET | Freq: Two times a day (BID) | ORAL | Status: DC
  Administered 2017-04-10: 10 mg via ORAL
  Filled 2017-04-10: qty 1

## 2017-04-10 MED ORDER — MAGNESIUM HYDROXIDE 400 MG/5ML PO SUSP: 30.0000 mL | Freq: Every day | ORAL | Status: DC | PRN

## 2017-04-10 MED ORDER — NICOTINE 21 MG/24HR TD PT24
21.0000 mg | MEDICATED_PATCH | Freq: Every day | TRANSDERMAL | Status: DC
  Administered 2017-04-11 – 2017-04-12 (×2): 21 mg via TRANSDERMAL
  Filled 2017-04-10 (×2): qty 1

## 2017-04-10 MED ORDER — ACETAMINOPHEN 325 MG PO TABS
650.0000 mg | ORAL_TABLET | Freq: Four times a day (QID) | ORAL | Status: DC | PRN
  Administered 2017-04-10: 650 mg via ORAL
  Filled 2017-04-10: qty 2

## 2017-04-10 MED ORDER — OLANZAPINE 10 MG PO TABS: 10.0000 mg | ORAL_TABLET | Freq: Two times a day (BID) | ORAL | Status: DC

## 2017-04-10 MED ORDER — ACETAMINOPHEN 500 MG PO TABS
1000.0000 mg | ORAL_TABLET | Freq: Four times a day (QID) | ORAL | Status: DC | PRN
  Administered 2017-04-11 (×2): 1000 mg via ORAL
  Filled 2017-04-10 (×2): qty 2

## 2017-04-10 MED ORDER — ACETAMINOPHEN 325 MG PO TABS: 650.0000 mg | ORAL_TABLET | Freq: Four times a day (QID) | ORAL | Status: DC | PRN

## 2017-04-10 MED ORDER — TRAZODONE HCL 100 MG PO TABS: 100.0000 mg | ORAL_TABLET | Freq: Every evening | ORAL | Status: DC | PRN

## 2017-04-10 MED ORDER — AMANTADINE HCL 100 MG PO CAPS: 100.0000 mg | ORAL_CAPSULE | Freq: Two times a day (BID) | ORAL | Status: DC

## 2017-04-10 MED ORDER — MAGNESIUM HYDROXIDE 400 MG/5ML PO SUSP
30.0000 mL | Freq: Every day | ORAL | Status: DC | PRN
  Administered 2017-04-12: 30 mL via ORAL
  Filled 2017-04-10: qty 30

## 2017-04-10 MED ORDER — IBUPROFEN 600 MG PO TABS
600.0000 mg | ORAL_TABLET | Freq: Four times a day (QID) | ORAL | Status: DC | PRN
  Administered 2017-04-11 – 2017-04-13 (×3): 600 mg via ORAL
  Filled 2017-04-10 (×3): qty 1

## 2017-04-10 MED ORDER — IBUPROFEN 600 MG PO TABS
600.0000 mg | ORAL_TABLET | Freq: Once | ORAL | Status: AC
  Administered 2017-04-10: 600 mg via ORAL

## 2017-04-10 MED ORDER — DIVALPROEX SODIUM ER 500 MG PO TB24: 1000.0000 mg | ORAL_TABLET | Freq: Every day | ORAL | Status: DC

## 2017-04-10 MED ORDER — BENZTROPINE MESYLATE 1 MG PO TABS: 2.0000 mg | ORAL_TABLET | Freq: Every day | ORAL | Status: DC

## 2017-04-10 MED ORDER — ALUM & MAG HYDROXIDE-SIMETH 200-200-20 MG/5ML PO SUSP: 30.0000 mL | ORAL | Status: DC | PRN

## 2017-04-10 MED ORDER — QUETIAPINE FUMARATE 200 MG PO TABS: 200.0000 mg | ORAL_TABLET | Freq: Every day | ORAL | Status: DC

## 2017-04-10 MED ORDER — IBUPROFEN 600 MG PO TABS
600.0000 mg | ORAL_TABLET | Freq: Once | ORAL | Status: AC
Start: 2017-04-10 — End: 2017-04-10
  Administered 2017-04-10: 600 mg via ORAL
  Filled 2017-04-10: qty 1

## 2017-04-10 MED ORDER — DIVALPROEX SODIUM ER 500 MG PO TB24
1000.0000 mg | ORAL_TABLET | Freq: Every day | ORAL | Status: DC
  Administered 2017-04-11 – 2017-04-12 (×2): 1000 mg via ORAL
  Filled 2017-04-10 (×2): qty 2

## 2017-04-10 MED ORDER — AMANTADINE HCL 100 MG PO CAPS
100.0000 mg | ORAL_CAPSULE | Freq: Two times a day (BID) | ORAL | Status: DC
  Administered 2017-04-10: 100 mg via ORAL
  Filled 2017-04-10 (×2): qty 1

## 2017-04-10 MED ORDER — HYDROXYZINE HCL 50 MG PO TABS: 50.0000 mg | ORAL_TABLET | Freq: Three times a day (TID) | ORAL | Status: DC | PRN

## 2017-04-10 MED ORDER — QUETIAPINE FUMARATE 25 MG PO TABS: 50.0000 mg | ORAL_TABLET | Freq: Three times a day (TID) | ORAL | Status: DC | PRN

## 2017-04-10 NOTE — ED Notes (Signed)
Pt to be admitted per psychiatrist.   Pt refused ordered medication for headache. MD made aware.   Maintained on 15 minute checks and observation by security camera for safety.

## 2017-04-10 NOTE — Progress Notes (Signed)
Held vital signs due to patient arrival from ED at 230.  Vitals were taken in ED at 225 and BP is running low.  Patient was given fluids upon arrival and has been sleeping soundly.  Will report off to am nurse that patient was not waken for vitals.
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
## 2017-04-10 NOTE — [facility] Group Notes (Signed)
[facility] Group Notes:  (Nursing/MHT/Case Management/Adjunct)  Date:  04/10/2017  Time:  6:07 PM  Type of Therapy:  Psychoeducational Skills  Participation Level:  Did Not Attend  Twanna Hy 04/10/2017, 6:07 PM

## 2017-04-10 NOTE — ED Notes (Signed)
Pt calm and cooperative. Complaint with all medications. Maintained on 15 minute checks and observation by security camera for safety.

## 2017-04-10 NOTE — [facility] Group Notes (Signed)
[facility] Group Notes:  (Nursing/MHT/Case Management/Adjunct)  Date:  04/10/2017  Time:  11:18 PM  Type of Therapy:  Psychoeducational Skills  Participation Level:  Did Not Attend   Peter Wilson 04/10/2017, 11:18 PM

## 2017-04-10 NOTE — ED Notes (Signed)
TTS aware pt is in rm.
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
## 2017-04-10 NOTE — ED Notes (Signed)
Pt ambulatory with Roxana to [facility].

## 2017-04-10 NOTE — ED Notes (Signed)
Report called to Gigi, RN.

## 2017-04-10 NOTE — ED Notes (Signed)
Patient resting quietly in room. No noted distress or abnormal behaviors noted. Will continue 15 minute checks and observation by security camera for safety.  Pt given breakfast tray.
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
## 2017-04-10 NOTE — ED Notes (Signed)
Pt signed voluntary consent form. Pt remains calm and cooperative. Maintained on 15 minute checks and observation by security camera for safety.
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
## 2017-04-10 NOTE — ED Notes (Signed)
Patient transported to CT 

## 2017-04-10 NOTE — ED Notes (Signed)
Pt signed voluntary consent to be admitted to BMU.  Report called to [place], Charity fundraiser. Belongings sent with pt to unit. Pt calm and cooperative.
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
## 2017-04-10 NOTE — ED Provider Notes (Signed)
-----------------------------------------   7:26 AM on 04/10/2017 -----------------------------------------   Blood pressure (!) 104/54, pulse 67, temperature 98.5 F (36.9 C), resp. rate 18, height 6' (1.829 m), weight 173 lb (78.5 kg), SpO2 96 %.  The patient had no acute events since last update.  Calm and cooperative at this time.  Disposition is pending Psychiatry/Behavioral Medicine team recommendations.     Willy Eddy, MD 04/10/17 [phone]
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
## 2017-04-10 NOTE — Tx Team (Signed)
Initial Treatment Plan 04/10/2017 3:10 PM Damiano Stamper ZOX:[id number]    PATIENT STRESSORS: Marital or family conflict Substance abuse   PATIENT STRENGTHS: Average or above average intelligence Communication skills   PATIENT IDENTIFIED PROBLEMS: Homicidal ideation 04/10/2017  Auditory hallucination 04/10/2017                   DISCHARGE CRITERIA:  Medical problems require only outpatient monitoring Verbal commitment to aftercare and medication compliance Withdrawal symptoms are absent or subacute and managed without 24-hour nursing intervention  PRELIMINARY DISCHARGE PLAN: Attend aftercare/continuing care group Placement in alternative living arrangements  PATIENT/FAMILY INVOLVEMENT: This treatment plan has been presented to and reviewed with the patient, Kwasi Joung, and/or family member, .  The patient and family have been given the opportunity to ask questions and make suggestions.  Leonarda Salon, RN 04/10/2017, 3:10 PM

## 2017-04-11 LAB — LIPID PANEL
Cholesterol: 224 mg/dL — ABNORMAL HIGH (ref 0–200)
HDL: 42 mg/dL (ref >40)
LDL CALC: 167 mg/dL — AB (ref 0–99)
Total CHOL/HDL Ratio: 5.3 RATIO
Triglycerides: 75 mg/dL (ref <150)
VLDL: 15 mg/dL (ref 0–40)

## 2017-04-11 LAB — TSH: TSH: 1.76 u[IU]/mL (ref 0.350–4.500)

## 2017-04-11 MED ORDER — BENZTROPINE MESYLATE 1 MG PO TABS: 2.0000 mg | ORAL_TABLET | Freq: Every day | ORAL | Status: DC

## 2017-04-11 MED ORDER — TRAZODONE HCL 100 MG PO TABS
100.0000 mg | ORAL_TABLET | Freq: Every day | ORAL | Status: DC
  Administered 2017-04-11: 100 mg via ORAL

## 2017-04-11 MED ORDER — OLANZAPINE 10 MG IM SOLR: 10.0000 mg | Freq: Once | INTRAMUSCULAR | Status: AC

## 2017-04-11 MED ORDER — OLANZAPINE 5 MG PO TABS
15.0000 mg | ORAL_TABLET | Freq: Two times a day (BID) | ORAL | Status: DC | PRN
  Administered 2017-04-12: 11:00:00 15 mg via ORAL
  Filled 2017-04-11: qty 1

## 2017-04-11 MED ORDER — TRAZODONE HCL 50 MG PO TABS
ORAL_TABLET | ORAL | Status: AC
  Filled 2017-04-11: qty 2

## 2017-04-11 MED ORDER — DIPHENHYDRAMINE HCL 50 MG/ML IJ SOLN: 50.0000 mg | Freq: Once | INTRAMUSCULAR | Status: DC

## 2017-04-11 MED ORDER — OLANZAPINE 10 MG IM SOLR
INTRAMUSCULAR | Status: AC
Start: 2017-04-11 — End: 2017-04-11
  Administered 2017-04-11: 10 mg via INTRAMUSCULAR
  Filled 2017-04-11: qty 10

## 2017-04-11 MED ORDER — OLANZAPINE 10 MG IM SOLR: 10.0000 mg | Freq: Two times a day (BID) | INTRAMUSCULAR | Status: DC | PRN

## 2017-04-11 MED ORDER — QUETIAPINE FUMARATE 100 MG PO TABS
300.0000 mg | ORAL_TABLET | Freq: Every day | ORAL | Status: DC
  Administered 2017-04-11: 22:00:00 300 mg via ORAL
  Filled 2017-04-11: qty 1

## 2017-04-11 MED ORDER — DIPHENHYDRAMINE HCL 50 MG/ML IJ SOLN
INTRAMUSCULAR | Status: AC
  Administered 2017-04-11: 50 mg via INTRAMUSCULAR
  Filled 2017-04-11: qty 1

## 2017-04-11 MED ORDER — DIPHENHYDRAMINE HCL 50 MG/ML IJ SOLN
INTRAMUSCULAR | Status: AC
  Filled 2017-04-11: qty 1
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
## 2017-04-11 NOTE — Progress Notes (Signed)
Patient walking down the hall then all of a sudden throws the walker and starts screaming and cussing.  When tried to ask what's going on, patient backs away and screams "leave me alone"   Calmly says his name patient takes off his jacket and states "All want to fight me."  Informed that just want him to return to his room so that we can talk.  Patient calmly walks to his room and once in his room start screaming "these voices are fucking with me and won't leave me alone"  Allowed patient to vent.  Dr. Informed and orders received.

## 2017-04-11 NOTE — Progress Notes (Signed)
Patient visible in the milieu.  Speech is tangential and he is grandiose at times.   Apologized for his outburst earlier during the day.  Medication compliant.  Support offered.  Safety checks maintained.

## 2017-04-11 NOTE — Progress Notes (Signed)
Recreation Therapy Notes  Date: 04.18.18 Time: 9:30 am Location: Craft Room  Group Topic: Self-esteem  Goal Area(s) Addresses:  Patient will write at least one positive trait about self. Patient will verbalize benefit of having healthy self-esteem.  Behavioral Response: Did not attend  Intervention: I Am  Activity: Patients were given a worksheet with the letter I on it and were instructed to write as many positive trait about themselves inside the letter.  Education: LRT educated patients on ways they can increase their self-esteem.  Education Outcome: Patient did not attend group.   Clinical Observations/Feedback: Patient did not attend group.  Jacquelynn Cree, LRT/CTRS 04/11/2017 10:16 AM

## 2017-04-11 NOTE — Tx Team (Signed)
Interdisciplinary Treatment and Diagnostic Plan Update  04/11/2017 Time of Session: [place] MRN: [id number]  Principal Diagnosis: Schizoaffective disorder, bipolar type [facility])  Secondary Diagnoses: Principal Problem:   Schizoaffective disorder, bipolar type ([place]) Active Problems:   Tobacco use disorder   Cannabis use disorder, moderate, dependence ([place])   Tardive dyskinesia   Antisocial traits   Asthma   Noncompliance   Current Medications:  Current Facility-Administered Medications  Medication Dose Route Frequency Provider Last Rate Last Dose  . acetaminophen (TYLENOL) tablet 1,000 mg  1,000 mg Oral Q6H PRN Hildred Priest, MD   1,000 mg at 04/11/17 1228  . alum & mag hydroxide-simeth (MAALOX/MYLANTA) 200-200-20 MG/5ML suspension 30 mL  30 mL Oral Q4H PRN Gonzella Lex, MD      . Derrill Memo ON 04/12/2017] benztropine (COGENTIN) tablet 2 mg  2 mg Oral QHS Hildred Priest, MD      . diphenhydrAMINE (BENADRYL) injection 50 mg  50 mg Intramuscular Once Hildred Priest, MD      . divalproex (DEPAKOTE ER) 24 hr tablet 1,000 mg  1,000 mg Oral QHS Gonzella Lex, MD      . ibuprofen (ADVIL,MOTRIN) tablet 600 mg  600 mg Oral Q6H PRN Hildred Priest, MD      . magnesium hydroxide (MILK OF MAGNESIA) suspension 30 mL  30 mL Oral Daily PRN Gonzella Lex, MD      . nicotine (NICODERM CQ - dosed in mg/24 hours) patch 21 mg  21 mg Transdermal Daily Hildred Priest, MD   21 mg at 04/11/17 0907  . OLANZapine (ZYPREXA) injection 10 mg  10 mg Intramuscular BID PRN Hildred Priest, MD       Or  . OLANZapine (ZYPREXA) tablet 15 mg  15 mg Oral BID PRN Hildred Priest, MD      . QUEtiapine (SEROQUEL) tablet 300 mg  300 mg Oral QHS Hildred Priest, MD      . traZODone (DESYREL) tablet 100 mg  100 mg Oral QHS Hildred Priest, MD       PTA Medications: Prescriptions Prior to Admission  Medication Sig Dispense  Refill Last Dose  . albuterol (PROVENTIL) (2.5 MG/3ML) 0.083% nebulizer solution Inhale 3 mLs into the lungs every 4 (four) hours as needed for wheezing or shortness of breath. (Patient not taking: Reported on 04/09/2017) 75 mL 1 Not Taking at Unknown time  . amantadine (SYMMETREL) 100 MG capsule Take 1 capsule (100 mg total) by mouth 2 (two) times daily. (Patient not taking: Reported on 04/09/2017) 60 capsule 1 Not Taking at Unknown time  . divalproex (DEPAKOTE ER) 500 MG 24 hr tablet Take 2 tablets (1,000 mg total) by mouth at bedtime. (Patient not taking: Reported on 04/09/2017) 60 tablet 1 Not Taking at Unknown time  . haloperidol decanoate (HALDOL DECANOATE) 100 MG/ML injection Inject 2.5 mLs (250 mg total) into the muscle every 30 (thirty) days. (Patient not taking: Reported on 04/09/2017) 2.5 mL 1 Not Taking at Unknown time  . OLANZapine (ZYPREXA) 15 MG tablet Take 2 tablets (30 mg total) by mouth at bedtime. (Patient not taking: Reported on 04/09/2017) 60 tablet 1 Not Taking at Unknown time  . sucralfate (CARAFATE) 1 g tablet Take 1 tablet (1 g total) by mouth 2 (two) times daily. (Patient not taking: Reported on 04/09/2017) 20 tablet 0 Not Taking at Unknown time    Patient Stressors: Marital or family conflict Substance abuse  Patient Strengths: Average or above average intelligence Communication skills  Treatment Modalities: Medication Management, Group  therapy, Case management,  1 to 1 session with clinician, Psychoeducation, Recreational therapy.   Physician Treatment Plan for Primary Diagnosis: Schizoaffective disorder, bipolar type (HCC) Long Term Goal(s): Improvement in symptoms so as ready for discharge Improvement in symptoms so as ready for discharge   Short Term Goals: Ability to identify changes in lifestyle to reduce recurrence of condition will improve Ability to demonstrate self-control will improve Compliance with prescribed medications will improve Ability to identify  triggers associated with substance abuse/mental health issues will improve  Medication Management: Evaluate patient's response, side effects, and tolerance of medication regimen.  Therapeutic Interventions: 1 to 1 sessions, Unit Group sessions and Medication administration.  Evaluation of Outcomes: Not Met  Physician Treatment Plan for Secondary Diagnosis: Principal Problem:   Schizoaffective disorder, bipolar type (HCC) Active Problems:   Tobacco use disorder   Cannabis use disorder, moderate, dependence (HCC)   Tardive dyskinesia   Antisocial traits   Asthma   Noncompliance  Long Term Goal(s): Improvement in symptoms so as ready for discharge Improvement in symptoms so as ready for discharge   Short Term Goals: Ability to identify changes in lifestyle to reduce recurrence of condition will improve Ability to demonstrate self-control will improve Compliance with prescribed medications will improve Ability to identify triggers associated with substance abuse/mental health issues will improve     Medication Management: Evaluate patient's response, side effects, and tolerance of medication regimen.  Therapeutic Interventions: 1 to 1 sessions, Unit Group sessions and Medication administration.  Evaluation of Outcomes: Not Met   RN Treatment Plan for Primary Diagnosis: Schizoaffective disorder, bipolar type (HCC) Long Term Goal(s): Knowledge of disease and therapeutic regimen to maintain health will improve  Short Term Goals: Ability to verbalize frustration and anger appropriately will improve and Ability to demonstrate self-control  Medication Management: RN will administer medications as ordered by provider, will assess and evaluate patient's response and provide education to patient for prescribed medication. RN will report any adverse and/or side effects to prescribing provider.  Therapeutic Interventions: 1 on 1 counseling sessions, Psychoeducation, Medication  administration, Evaluate responses to treatment, Monitor vital signs and CBGs as ordered, Perform/monitor CIWA, COWS, AIMS and Fall Risk screenings as ordered, Perform wound care treatments as ordered.  Evaluation of Outcomes: Not Met   [name] Treatment Plan for Primary Diagnosis: Schizoaffective disorder, bipolar type (HCC) Long Term Goal(s): Safe transition to appropriate next level of care at discharge, Engage patient in therapeutic group addressing interpersonal concerns.  Short Term Goals: Engage patient in aftercare planning with referrals and resources, Increase social support and Increase skills for wellness and recovery  Therapeutic Interventions: Assess for all discharge needs, 1 to 1 time with Social worker, Explore available resources and support systems, Assess for adequacy in community support network, Educate family and significant other(s) on suicide prevention, Complete Psychosocial Assessment, Interpersonal group therapy.  Evaluation of Outcomes: Not Met   Progress in Treatment: Attending groups: No. Participating in groups: No. Taking medication as prescribed: Yes. Toleration medication: Yes. Family/Significant other contact made: No, will contact:  aunt Patient understands diagnosis: Yes. Discussing patient identified problems/goals with staff: Yes. Medical problems stabilized or resolved: Yes. Denies suicidal/homicidal ideation: Yes. Issues/concerns per patient self-inventory: No. Other: none  New problem(s) identified: No, Describe:  none  New Short Term/Long Term Goal(s): Pt goal is get back stable on his medication and to find a place to live.  Discharge Plan or Barriers: Pt will continue working with [facility].  Reason for Continuation of Hospitalization: Aggression  Medication stabilization  Estimated Length of Stay: 5-7 days  Attendees: Patient: Peter Wilson 04/11/2017   Physician: Dr. Jerilee Hoh, MD 04/11/2017   Nursing: Geraldo Pitter, RN  04/11/2017   RN Care Manager: 04/11/2017   Social Worker: Lurline Idol, [name] 04/11/2017   Recreational Therapist: Everitt Amber, LRT/CTRS  04/11/2017   Other:  04/11/2017   Other:  04/11/2017   Other: 04/11/2017        Scribe for Treatment Team: Joanne Chars, [place] 04/11/2017 2:20 PM

## 2017-04-11 NOTE — [facility] Group Notes (Signed)
[facility] [name] Group Therapy 04/11/2017 1:15 PM  Type of Therapy: Group Therapy- Emotion Regulation  Participation Level: Pt invited. Did not attend.    Jonathon Jordan, MSW, LCSWA 04/11/2017 2:03 PM
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
## 2017-04-11 NOTE — Progress Notes (Signed)
D: Pt denies SI/HI/AVH. Pt is pleasant and cooperative, affect is blunted, mood is angry and irritable. Pt appears anxious, not participating in treatment plan, and he is not interacting with peers and staff.  A: Pt was offered support and encouragement. Pt was offered scheduled medications. Pt was encouraged to attend groups. Q 15 minute checks were done for safety.  R:Pt did not attend group. Pt is not complaint with medication. Patient is not receptive to treatment, safety maintained on unit.
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
## 2017-04-12 LAB — PROLACTIN: Prolactin: 26.4 ng/mL — ABNORMAL HIGH (ref 4.0–15.2)

## 2017-04-12 LAB — HEMOGLOBIN A1C
Hgb A1c MFr Bld: 4.8 % (ref 4.8–5.6)
MEAN PLASMA GLUCOSE: 91 mg/dL

## 2017-04-12 LAB — VALPROIC ACID LEVEL: Valproic Acid Lvl: 37 ug/mL — ABNORMAL LOW (ref 50.0–100.0)

## 2017-04-12 MED ORDER — OLANZAPINE 20 MG PO TABS: 20.0000 mg | ORAL_TABLET | Freq: Every day | ORAL | 0 refills | Status: DC

## 2017-04-12 MED ORDER — MIRTAZAPINE 15 MG PO TABS: 15.0000 mg | ORAL_TABLET | Freq: Every day | ORAL | 0 refills | Status: DC

## 2017-04-12 MED ORDER — BENZTROPINE MESYLATE 2 MG PO TABS: 2.0000 mg | ORAL_TABLET | Freq: Every day | ORAL | 0 refills | Status: DC

## 2017-04-12 MED ORDER — LORAZEPAM 2 MG PO TABS
2.0000 mg | ORAL_TABLET | ORAL | Status: DC | PRN
  Administered 2017-04-12: 2 mg via ORAL

## 2017-04-12 MED ORDER — LORAZEPAM 2 MG/ML IJ SOLN: 2.0000 mg | INTRAMUSCULAR | Status: DC | PRN

## 2017-04-12 MED ORDER — DIPHENHYDRAMINE HCL 25 MG PO CAPS
50.0000 mg | ORAL_CAPSULE | Freq: Every day | ORAL | Status: DC
  Administered 2017-04-12: 50 mg via ORAL
  Filled 2017-04-12: qty 2

## 2017-04-12 MED ORDER — LORAZEPAM 2 MG PO TABS
ORAL_TABLET | ORAL | Status: AC
  Filled 2017-04-12: qty 1

## 2017-04-12 MED ORDER — DIVALPROEX SODIUM ER 500 MG PO TB24: 1000.0000 mg | ORAL_TABLET | Freq: Every day | ORAL | 0 refills | Status: DC

## 2017-04-12 MED ORDER — MIRTAZAPINE 15 MG PO TABS
15.0000 mg | ORAL_TABLET | Freq: Every day | ORAL | Status: DC
  Administered 2017-04-12: 15 mg via ORAL
  Filled 2017-04-12: qty 1

## 2017-04-12 MED ORDER — OLANZAPINE 5 MG PO TABS
15.0000 mg | ORAL_TABLET | Freq: Every day | ORAL | Status: DC
  Administered 2017-04-12: 21:00:00 15 mg via ORAL
  Filled 2017-04-12: qty 1

## 2017-04-12 NOTE — Progress Notes (Signed)
Recreation Therapy Notes  Date: 04.19.18 Time: 9:30 am Location: Craft Room  Group Topic: Leisure Education  Goal Area(s) Addresses:  Patient will identify activity for each letter of the alphabet. Patient will verbalize ability to integrate positive leisure into life post d/c. Patient will verbalize ability to use leisure as a Associate Professor.  Behavioral Response: Left early  Intervention: Leisure Alphabet  Activity: Patients were given a Leisure Information systems manager and were instructed to write healthy leisure activities for each letter of the alphabet.  Education: LRT educated patients on what they need to participate in leisure.  Education Outcome: Patient left group before LRT educated group.  Clinical Observations/Feedback: Patient wrote some leisure activities. Patient left group at approximately 9:42 am and did not return to group.  Jacquelynn Cree, LRT/CTRS 04/12/2017 10:13 AM

## 2017-04-12 NOTE — Progress Notes (Signed)
D: Pt denies SI/HI/AVH. Pt is pleasant and cooperative. Pt still labile at times but is very redirectable. Pt was concerned earlier about having his blood drawn daily, but after explaining the reasons pt appeared to understand and was more cooperative. Pt stated he was leaving tomorrow, he said he had a group home lined up. Pt said he wanted his walker, but due to his recent episodes with throwing the walker pt was informed that was not going to be appropriate at this time, that he would have to ask the doctor if he could have it back, and pt felt satisfied with that response.   A: Pt was offered support and encouragement. Pt was given scheduled medications. Pt was encourage to attend groups. Q 15 minute checks were done for safety.   R:Pt attends groups and interacts well with peers and staff. Pt is taking medication. Pt has no complaints.Pt receptive to treatment and safety maintained on unit.
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
## 2017-04-12 NOTE — [facility] Suicide Risk Assessment (Signed)
[facility] Discharge Suicide Risk Assessment   Principal Problem: Schizoaffective disorder, bipolar type [facility]) Discharge Diagnoses:  Patient Active Problem List   Diagnosis Date Noted  . Noncompliance [Z91.19] 04/10/2017  . Asthma [J45.909] 07/13/2016  . Schizoaffective disorder, bipolar type ([facility]) [F25.0] 06/27/2016  . Tardive dyskinesia [G24.01] 09/15/2015  . Antisocial traits [F60.2] 09/15/2015  . Tobacco use disorder [F17.200] 09/14/2015  . Cannabis use disorder, moderate, dependence ([facility]) [F12.20] 09/14/2015      Psychiatric Specialty Exam: ROS  Blood pressure (!) 131/97, pulse 78, temperature 97.7 F (36.5 C), temperature source Oral, resp. rate 19, height  (1.803 m), weight 76.7 kg (169 lb), SpO2 100 %.Body mass index is 23.57 kg/m.                                                       Mental Status Per Nursing Assessment::   On Admission:  Thoughts of violence towards others, Plan to harm others  Demographic Factors:  Male  Loss Factors: Decrease in vocational status  Historical Factors: Impulsivity  Risk Reduction Factors:   Positive social support  No access to guns  Continued Clinical Symptoms:  Alcohol/Substance Abuse/Dependencies Previous Psychiatric Diagnoses and Treatments  Cognitive Features That Contribute To Risk:  Closed-mindedness    Suicide Risk:  Minimal: No identifiable suicidal ideation.  Patients presenting with no risk factors but with morbid ruminations; may be classified as minimal risk based on the severity of the depressive symptoms     Jimmy Footman, MD 04/12/2017, 4:27 PM

## 2017-04-12 NOTE — [facility] Suicide Risk Assessment (Signed)
[facility] INPATIENT:  Family/Significant Other Suicide Prevention Education  Suicide Prevention Education:  Contact Attempts: Peter Wilson, aunt, [phone], has been identified by the patient as the family member/significant other with whom the patient will be residing, and identified as the person(s) who will aid the patient in the event of a mental health crisis.  With written consent from the patient, two attempts were made to provide suicide prevention education, prior to and/or following the patient's discharge.  We were unsuccessful in providing suicide prevention education.  A suicide education pamphlet was given to the patient to share with family/significant other.  Date and time of first attempt:04/12/17, 84 Date and time of second attempt:  Lorri Frederick, [name] 04/12/2017, 8:45 AM

## 2017-04-12 NOTE — [facility] Suicide Risk Assessment (Signed)
[facility] INPATIENT:  Family/Significant Other Suicide Prevention Education  Suicide Prevention Education:  Education Completed; Adarryl Goldammer, aunt, [phone], has been identified by the patient as the family member/significant other with whom the patient will be residing, and identified as the person(s) who will aid the patient in the event of a mental health crisis (suicidal ideations/suicide attempt).  With written consent from the patient, the family member/significant other has been provided the following suicide prevention education, prior to the and/or following the discharge of the patient.  The suicide prevention education provided includes the following:  Suicide risk factors  Suicide prevention and interventions  National Suicide Hotline telephone number  [facility] assessment telephone number  [facility] Emergency Assistance 911  [facility] and/or Residential Mobile Crisis Unit telephone number  Request made of family/significant other to:  Remove weapons (e.g., guns, rifles, knives), all items previously/currently identified as safety concern.  No guns, per Hillary Bow.  Remove drugs/medications (over-the-counter, prescriptions, illicit drugs), all items previously/currently identified as a safety concern.  The family member/significant other verbalizes understanding of the suicide prevention education information provided.  The family member/significant other agrees to remove the items of safety concern listed above.  Lorri Frederick, [name] 04/12/2017, 3:38 PM

## 2017-04-12 NOTE — Plan of Care (Signed)
Problem: Pain Managment: Goal: General experience of comfort will improve Outcome: Progressing Educated on pain rating scale, PRN medications administered as needed.

## 2017-04-12 NOTE — Plan of Care (Signed)
Problem: Coping: Goal: Ability to verbalize feelings will improve Outcome: Progressing Patient verbalized feelings to staff.    
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
## 2017-04-12 NOTE — [facility] Group Notes (Signed)
[facility] [name] Group Therapy   04/12/2017 1pm   Type of Therapy: Group Therapy   Participation Level: Patient invited but did not attend.     Hampton Abbot, MSW, LCSWA 04/12/2017, 2:06PM
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
## 2017-04-13 LAB — VALPROIC ACID LEVEL: VALPROIC ACID LVL: 71 ug/mL (ref 50.0–100.0)

## 2017-04-13 MED ORDER — OLANZAPINE 20 MG PO TABS: 20.0000 mg | ORAL_TABLET | Freq: Every day | ORAL | 0 refills | Status: DC

## 2017-04-13 MED ORDER — BENZTROPINE MESYLATE 2 MG PO TABS: 2.0000 mg | ORAL_TABLET | Freq: Every day | ORAL | 0 refills | Status: DC

## 2017-04-13 MED ORDER — MIRTAZAPINE 15 MG PO TABS: 15.0000 mg | ORAL_TABLET | Freq: Every day | ORAL | 0 refills | Status: DC

## 2017-04-13 MED ORDER — DIVALPROEX SODIUM ER 500 MG PO TB24: 1000.0000 mg | ORAL_TABLET | Freq: Every day | ORAL | 0 refills | Status: DC

## 2017-04-13 NOTE — Progress Notes (Signed)
Patient ID: Peter Wilson, male   DOB: [birth year], 41 y.o.   MRN: [id number] LATE ENTRY FOR 04/10/17--PER STATE REGULATIONS 482.30  THIS CHART WAS REVIEWED FOR MEDICAL NECESSITY WITH RESPECT TO THE PATIENT'S ADMISSION/DURATION OF STAY.  NEXT REVIEW DATE:04/14/17  Loura Halt, RN, BSN CASE MANAGER

## 2017-04-13 NOTE — Plan of Care (Signed)
Problem: [facility] Participation in Recreation Therapeutic Interventions Goal: STG-Patient will demonstrate improved self esteem by identif STG: Self-Esteem - Within 4 treatment sessions, patient will verbalize at least 5 positive affirmation statements in each of 2 treatment sessions to increase self-esteem.  Outcome: Not Applicable Date Met: 40/35/24 At approximately 11:55 am, patient reported he did not need want to work on these goals as he does not need help with it.  Leonette Monarch, LRT/CTRS 04.20.18 2:20 pm Goal: STG-Other Recreation Therapy Goal (Specify) STG: Stress Management - Within 4 treatment sessions, patient will verbalize understanding of the stress management techniques in each of 2 treatment sessions to increase stress management skills.  Outcome: Not Applicable Date Met: 81/85/90 At approximately 11:55 am, patient reported he did not need want to work on these goals as he does not need help with it.  Leonette Monarch, LRT/CTRS 04.20.18 2:20 pm

## 2017-04-13 NOTE — Progress Notes (Signed)
Discharge note:  Patient discharged home per MD order.  Patient received all personal belongings from unit and locker.  Patient denies any thoughts of self harm.  He denies HI and does not appear to be responding to internal stimuli.  Patient received prescriptions of his medications.  Patient can be labile at times, however, he is redirectable.  His mood is stable and he is pleasant this morning.  Patient is compliant with his medications.  Patient is ambulating well.  Patient left ambulatory with his ACT team representative.

## 2017-04-13 NOTE — Progress Notes (Signed)
Recreation Therapy Notes  Date: 04.20.18 Time: 9:30 am Location: Craft Room  Group Topic: Coping Skills  Goal Area(s) Addresses:  Patient will participate in healthy coping skill. Patient will verbalize benefit of using art as a coping skill.  Behavioral Response: Left early   Intervention: Coloring  Activity: Patients were given coloring sheets to color and were instructed to think about the emotions they were feeling and what their minds were focused on.  Education: LRT educated patients on healthy coping skills.  Education Outcome: Patient left before LRT educated group.  Clinical Observations/Feedback: Patient left group at approximately 9:40 am before group really started. Patient did not return to group.  Jacquelynn Cree, LRT/CTRS 04/13/2017 10:22 AM

## 2017-04-13 NOTE — Progress Notes (Signed)
CSW spoke to [place] at [facility] group home.  She is willing to take pt in but will not be able to until Monday, 04/16/17.  She also needs to speak to pt payee to ensure she can receive pt disability money.  CSW spoke to Carilyn Goodpasture, payee, who is unwilling to allow pt to return to her home at this time but is in support of group home placement with Ms. Torrain.  She agreed to call Ms. Torrain to discuss the payment issues.  CSW spoke with pt, Peter Wilson, about discharge occurring today and pt not currently having a housing option.  Pt stated that he just wanted to be discharged and would work something out.  CSW discussed referral to shelter but pt did not want to go to the shelter and just asked that his case worker Peter Wilson, Peter Wilson) come get him.  CSW spoke to Peter Wilson at [facility] [phone], and she is willing to pick pt up and give him a ride to the location of his choice.  She has spoken to [employer] as well and will work with pt on admission to group home. Garner Nash, MSW, [name] Clinical Social Worker 04/13/2017 3:20 PM

## 2017-04-13 NOTE — [facility] Group Notes (Signed)
[facility] Group Notes:  (Nursing/MHT/Case Management/Adjunct)  Date:  04/13/2017  Time:  5:45 AM  Type of Therapy:  Psychoeducational Skills  Participation Level:  Active  Participation Quality:  Appropriate and Attentive  Affect:  Appropriate  Cognitive:  Appropriate  Insight:  Appropriate and Good  Engagement in Group:  Engaged  Modes of Intervention:  Discussion, Socialization and Support  Summary of Progress/Problems:  Chancy Milroy 04/13/2017, 5:45 AM

## 2017-04-13 NOTE — [facility] Group Notes (Signed)
[facility] [name] Group Therapy Note  Date/Time: 04/13/17, 1300  Type of Therapy and Topic:  Group Therapy:  Feelings around Relapse and Recovery  Participation Level:  None   Mood:  Description of Group:    Patients in this group will discuss emotions they experience before and after a relapse. They will process how experiencing these feelings, or avoidance of experiencing them, relates to having a relapse. Facilitator will guide patients to explore emotions they have related to recovery. Patients will be encouraged to process which emotions are more powerful. They will be guided to discuss the emotional reaction significant others in their lives may have to patients' relapse or recovery. Patients will be assisted in exploring ways to respond to the emotions of others without this contributing to a relapse.  Therapeutic Goals: 1. Patient will identify two or more emotions that lead to relapse for them:  2. Patient will identify two emotions that result when they relapse:  3. Patient will identify two emotions related to recovery:  4. Patient will demonstrate ability to communicate their needs through discussion and/or role plays.   Summary of Patient Progress: Pt came to group but left several minutes after it began.     Therapeutic Modalities:   Cognitive Behavioral Therapy Solution-Focused Therapy Assertiveness Training Relapse Prevention Therapy  Daleen Squibb, [name]

## 2017-04-13 NOTE — Progress Notes (Signed)
  [facility] Case Management Discharge Plan :  Will you be returning to the same living situation after discharge:  No. At discharge, do you have transportation home?: Yes,  ACT team Do you have the ability to pay for your medications: Yes,  medicare  Release of information consent forms completed and in the chart;  Patient's signature needed at discharge.  Patient to Follow up at: Follow-up Information    [facility] Follow up on 04/13/2017.   Why:  [facility] team will pick you up from the hospital today and resume services immediately. Contact information: [address] [place] [address] [phone]           Next level of care provider has access to [facility] Link:no  Safety Planning and Suicide Prevention discussed: Yes,  aunt  Have you used any form of tobacco in the last 30 days? (Cigarettes, Smokeless Tobacco, Cigars, and/or Pipes): Yes  Has patient been referred to the Quitline?: Patient refused referral  Patient has been referred for addiction treatment: Yes  Lorri Frederick, [name] 04/13/2017, 3:23 PM

## 2017-04-16 NOTE — Progress Notes (Signed)
Recreation Therapy Notes  INPATIENT RECREATION TR PLAN  Patient Details Name: Peter Wilson MRN: [id number] DOB: 03-17-1976 Today's Date: 04/16/2017  Rec Therapy Plan Is patient appropriate for Therapeutic Recreation?: Yes Treatment times per week: At least once a week TR Treatment/Interventions: 1:1 session, Group participation (Comment) (Appropriate participation in daily recreational therapy tx)  Discharge Criteria Pt will be discharged from therapy if:: Treatment goals are met, Discharged Treatment plan/goals/alternatives discussed and agreed upon by:: Patient/family  Discharge Summary Short term goals set: See Care Plan Short term goals met: Other (Comment) (Not applicable) Reason goals not met: Patient reported he was fine and did not want to work on his goals. Therapeutic equipment acquired: None Reason patient discharged from therapy: Discharge from hospital Pt/family agrees with progress & goals achieved: Yes Date patient discharged from therapy: 04/13/17   Leonette Monarch, LRT/CTRS 04/16/2017, 11:48 AM

## 2018-02-04 ENCOUNTER — Emergency Department: Payer: Medicare Other

## 2018-02-04 ENCOUNTER — Emergency Department
Admission: EM | Admit: 2018-02-04 | Discharge: 2018-02-05 | Disposition: A | Discharge status: Other Acute Inpatient Hospital | Payer: Medicare Other | Attending: Emergency Medicine | Admitting: Emergency Medicine

## 2018-02-04 ENCOUNTER — Other Ambulatory Visit: Payer: Self-pay

## 2018-02-04 DIAGNOSIS — S81012A Laceration without foreign body, left knee, initial encounter: Secondary | ICD-10-CM | POA: Diagnosis not present

## 2018-02-04 DIAGNOSIS — Z23 Encounter for immunization: Secondary | ICD-10-CM | POA: Insufficient documentation

## 2018-02-04 DIAGNOSIS — Y929 Unspecified place or not applicable: Secondary | ICD-10-CM | POA: Diagnosis not present

## 2018-02-04 DIAGNOSIS — Y939 Activity, unspecified: Secondary | ICD-10-CM | POA: Insufficient documentation

## 2018-02-04 DIAGNOSIS — Y999 Unspecified external cause status: Secondary | ICD-10-CM | POA: Insufficient documentation

## 2018-02-04 DIAGNOSIS — S02600A Fracture of unspecified part of body of mandible, initial encounter for closed fracture: Secondary | ICD-10-CM | POA: Insufficient documentation

## 2018-02-04 DIAGNOSIS — Z79899 Other long term (current) drug therapy: Secondary | ICD-10-CM | POA: Insufficient documentation

## 2018-02-04 DIAGNOSIS — F17210 Nicotine dependence, cigarettes, uncomplicated: Secondary | ICD-10-CM | POA: Insufficient documentation

## 2018-02-04 DIAGNOSIS — S0990XA Unspecified injury of head, initial encounter: Secondary | ICD-10-CM | POA: Diagnosis present

## 2018-02-04 LAB — CBC WITH DIFFERENTIAL/PLATELET
Basophils Absolute: 0 10*3/uL (ref 0–0.1)
Basophils Relative: 1 %
Eosinophils Absolute: 0.1 10*3/uL (ref 0–0.7)
Eosinophils Relative: 2 %
HEMATOCRIT: 38.2 % — AB (ref 40.0–52.0)
HEMOGLOBIN: 12.9 g/dL — AB (ref 13.0–18.0)
LYMPHS ABS: 2.1 10*3/uL (ref 1.0–3.6)
LYMPHS PCT: 32 %
MCH: 29.2 pg (ref 26.0–34.0)
MCHC: 33.8 g/dL (ref 32.0–36.0)
MCV: 86.3 fL (ref 80.0–100.0)
MONOS PCT: 7 %
Monocytes Absolute: 0.5 10*3/uL (ref 0.2–1.0)
NEUTROS ABS: 3.9 10*3/uL (ref 1.4–6.5)
NEUTROS PCT: 58 %
Platelets: 283 10*3/uL (ref 150–440)
RBC: 4.43 MIL/uL (ref 4.40–5.90)
RDW: 14.1 % (ref 11.5–14.5)
WBC: 6.7 10*3/uL (ref 3.8–10.6)

## 2018-02-04 LAB — COMPREHENSIVE METABOLIC PANEL
ALT: 10 U/L — AB (ref 17–63)
AST: 39 U/L (ref 15–41)
Albumin: 4.5 g/dL (ref 3.5–5.0)
Alkaline Phosphatase: 55 U/L (ref 38–126)
Anion gap: 9 (ref 5–15)
BUN: 11 mg/dL (ref 6–20)
CHLORIDE: 105 mmol/L (ref 101–111)
CO2: 23 mmol/L (ref 22–32)
Calcium: 9.6 mg/dL (ref 8.9–10.3)
Creatinine, Ser: 1.08 mg/dL (ref 0.61–1.24)
Glucose, Bld: 98 mg/dL (ref 65–99)
POTASSIUM: 3.7 mmol/L (ref 3.5–5.1)
SODIUM: 137 mmol/L (ref 135–145)
Total Bilirubin: 0.8 mg/dL (ref 0.3–1.2)
Total Protein: 8.1 g/dL (ref 6.5–8.1)

## 2018-02-04 MED ORDER — HALOPERIDOL LACTATE 5 MG/ML IJ SOLN: 5.0000 mg | Freq: Once | INTRAMUSCULAR | Status: DC

## 2018-02-04 MED ORDER — HALOPERIDOL LACTATE 5 MG/ML IJ SOLN
INTRAMUSCULAR | Status: AC
  Filled 2018-02-04: qty 1

## 2018-02-04 MED ORDER — LORAZEPAM 2 MG/ML IJ SOLN
INTRAMUSCULAR | Status: AC
  Filled 2018-02-04: qty 1

## 2018-02-04 MED ORDER — TETANUS-DIPHTH-ACELL PERTUSSIS 5-2.5-18.5 LF-MCG/0.5 IM SUSP
0.5000 mL | Freq: Once | INTRAMUSCULAR | Status: AC
  Administered 2018-02-04: 0.5 mL via INTRAMUSCULAR
  Filled 2018-02-04: qty 0.5

## 2018-02-04 MED ORDER — LORAZEPAM 2 MG/ML IJ SOLN: 2.0000 mg | Freq: Once | INTRAMUSCULAR | Status: DC

## 2018-02-04 MED ORDER — MIDAZOLAM HCL 5 MG/5ML IJ SOLN
INTRAMUSCULAR | Status: AC
  Administered 2018-02-04: 2 mg via INTRAVENOUS
  Filled 2018-02-04: qty 5

## 2018-02-04 MED ORDER — MIDAZOLAM HCL 5 MG/5ML IJ SOLN
2.0000 mg | Freq: Once | INTRAMUSCULAR | Status: AC
  Administered 2018-02-04: 2 mg via INTRAVENOUS

## 2018-02-04 NOTE — ED Notes (Signed)
Pt went to xray. Pt to be dressed out by Roberts GaudyHenry Rn when he returns.
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
## 2018-02-04 NOTE — ED Notes (Signed)
Peter Wilson [phone]
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
## 2018-02-05 LAB — ACETAMINOPHEN LEVEL: Acetaminophen (Tylenol), Serum: <10 ug/mL — ABNORMAL LOW (ref 10–30)

## 2018-02-05 LAB — SALICYLATE LEVEL

## 2018-02-05 LAB — ETHANOL

## 2018-02-05 MED ORDER — OXYCODONE-ACETAMINOPHEN 5-325 MG PO TABS
1.0000 | ORAL_TABLET | Freq: Once | ORAL | Status: AC
  Administered 2018-02-05: 1 via ORAL

## 2018-02-05 MED ORDER — OXYCODONE-ACETAMINOPHEN 5-325 MG PO TABS
ORAL_TABLET | ORAL | Status: AC
  Filled 2018-02-05: qty 1

## 2018-02-05 NOTE — ED Notes (Signed)
Patient refused to participate with TTS for assessment.

## 2018-02-05 NOTE — ED Notes (Signed)
Pt under IVC, not able to sign consent for transfer.

## 2018-02-05 NOTE — ED Notes (Signed)
Pt changed into behavioral health appropriate scrubs w/ mesh underwear, belongings collected and placed in bag which was labeled and placed w/ other belongings.
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
## 2018-02-05 NOTE — ED Notes (Signed)
Medical Necessity and EMTALA documentation reviewed at this time and complete per policy.

## 2018-02-05 NOTE — ED Notes (Signed)
SOC in progress.  

## 2018-02-05 NOTE — ED Notes (Signed)
Pt [facility] by [facility] with [facility]. Pt. Verbalizes understanding of transfer. VS stable.  Pt. In NAD and stable at the time of departure from the unit, departing unit by the safest and most appropriate manner per that pt condition and limitations with all belongings accounted for.

## 2018-04-01 ENCOUNTER — Encounter: Payer: Self-pay | Admitting: Emergency Medicine

## 2018-04-01 ENCOUNTER — Inpatient Hospital Stay
Admission: AD | Admit: 2018-04-01 | Discharge: 2018-04-17 | DRG: 885 | Disposition: A | Discharge status: Home / Self Care | Payer: Medicare Other | Attending: Psychiatry | Admitting: Psychiatry

## 2018-04-01 ENCOUNTER — Emergency Department
Admission: EM | Admit: 2018-04-01 | Discharge: 2018-04-01 | Disposition: A | Discharge status: Home / Self Care | Payer: Medicare Other | Attending: Emergency Medicine | Admitting: Emergency Medicine

## 2018-04-01 ENCOUNTER — Other Ambulatory Visit: Payer: Self-pay

## 2018-04-01 DIAGNOSIS — F17210 Nicotine dependence, cigarettes, uncomplicated: Secondary | ICD-10-CM | POA: Insufficient documentation

## 2018-04-01 DIAGNOSIS — F250 Schizoaffective disorder, bipolar type: Secondary | ICD-10-CM

## 2018-04-01 DIAGNOSIS — G2401 Drug induced subacute dyskinesia: Secondary | ICD-10-CM | POA: Diagnosis present

## 2018-04-01 DIAGNOSIS — Z79899 Other long term (current) drug therapy: Secondary | ICD-10-CM | POA: Diagnosis not present

## 2018-04-01 DIAGNOSIS — G4700 Insomnia, unspecified: Secondary | ICD-10-CM | POA: Diagnosis present

## 2018-04-01 DIAGNOSIS — Z5181 Encounter for therapeutic drug level monitoring: Secondary | ICD-10-CM

## 2018-04-01 DIAGNOSIS — J45909 Unspecified asthma, uncomplicated: Secondary | ICD-10-CM | POA: Diagnosis present

## 2018-04-01 DIAGNOSIS — G43909 Migraine, unspecified, not intractable, without status migrainosus: Secondary | ICD-10-CM | POA: Diagnosis present

## 2018-04-01 DIAGNOSIS — Z9119 Patient's noncompliance with other medical treatment and regimen: Secondary | ICD-10-CM | POA: Diagnosis not present

## 2018-04-01 DIAGNOSIS — Z91199 Patient's noncompliance with other medical treatment and regimen due to unspecified reason: Secondary | ICD-10-CM

## 2018-04-01 DIAGNOSIS — F1220 Cannabis dependence, uncomplicated: Secondary | ICD-10-CM | POA: Diagnosis present

## 2018-04-01 DIAGNOSIS — S02609S Fracture of mandible, unspecified, sequela: Secondary | ICD-10-CM | POA: Diagnosis not present

## 2018-04-01 DIAGNOSIS — F29 Unspecified psychosis not due to a substance or known physiological condition: Secondary | ICD-10-CM | POA: Diagnosis present

## 2018-04-01 DIAGNOSIS — R6884 Jaw pain: Secondary | ICD-10-CM | POA: Diagnosis present

## 2018-04-01 DIAGNOSIS — F17200 Nicotine dependence, unspecified, uncomplicated: Secondary | ICD-10-CM | POA: Diagnosis present

## 2018-04-01 DIAGNOSIS — Z5320 Procedure and treatment not carried out because of patient's decision for unspecified reasons: Secondary | ICD-10-CM | POA: Diagnosis present

## 2018-04-01 DIAGNOSIS — Z880 Allergy status to penicillin: Secondary | ICD-10-CM | POA: Diagnosis not present

## 2018-04-01 DIAGNOSIS — R4781 Slurred speech: Secondary | ICD-10-CM | POA: Diagnosis not present

## 2018-04-01 DIAGNOSIS — F209 Schizophrenia, unspecified: Secondary | ICD-10-CM | POA: Diagnosis present

## 2018-04-01 LAB — ETHANOL: Alcohol, Ethyl (B): <10 mg/dL (ref <10)

## 2018-04-01 LAB — CBC WITH DIFFERENTIAL/PLATELET
Basophils Absolute: 0 10*3/uL (ref 0–0.1)
Basophils Relative: 0 %
Eosinophils Absolute: 0.2 10*3/uL (ref 0–0.7)
Eosinophils Relative: 4 %
HEMATOCRIT: 37.6 % — AB (ref 40.0–52.0)
Hemoglobin: 12.2 g/dL — ABNORMAL LOW (ref 13.0–18.0)
LYMPHS PCT: 29 %
Lymphs Abs: 1.5 10*3/uL (ref 1.0–3.6)
MCH: 28.5 pg (ref 26.0–34.0)
MCHC: 32.5 g/dL (ref 32.0–36.0)
MCV: 87.7 fL (ref 80.0–100.0)
MONO ABS: 0.5 10*3/uL (ref 0.2–1.0)
MONOS PCT: 10 %
NEUTROS ABS: 2.8 10*3/uL (ref 1.4–6.5)
Neutrophils Relative %: 57 %
Platelets: 352 10*3/uL (ref 150–440)
RBC: 4.28 MIL/uL — ABNORMAL LOW (ref 4.40–5.90)
RDW: 14.1 % (ref 11.5–14.5)
WBC: 5.1 10*3/uL (ref 3.8–10.6)

## 2018-04-01 LAB — COMPREHENSIVE METABOLIC PANEL
ALT: 9 U/L — ABNORMAL LOW (ref 17–63)
AST: 32 U/L (ref 15–41)
Albumin: 4.4 g/dL (ref 3.5–5.0)
Alkaline Phosphatase: 49 U/L (ref 38–126)
Anion gap: 6 (ref 5–15)
BUN: 9 mg/dL (ref 6–20)
CO2: 30 mmol/L (ref 22–32)
Calcium: 9.4 mg/dL (ref 8.9–10.3)
Chloride: 102 mmol/L (ref 101–111)
Creatinine, Ser: 1.03 mg/dL (ref 0.61–1.24)
GFR calc Af Amer: >60 mL/min (ref >60)
GFR calc non Af Amer: >60 mL/min (ref >60)
Glucose, Bld: 105 mg/dL — ABNORMAL HIGH (ref 65–99)
Potassium: 4.3 mmol/L (ref 3.5–5.1)
Sodium: 138 mmol/L (ref 135–145)
Total Bilirubin: 0.6 mg/dL (ref 0.3–1.2)
Total Protein: 7.6 g/dL (ref 6.5–8.1)

## 2018-04-01 MED ORDER — OLANZAPINE 10 MG PO TABS: 20.0000 mg | ORAL_TABLET | Freq: Every day | ORAL | Status: DC

## 2018-04-01 MED ORDER — HALOPERIDOL 5 MG PO TABS
5.0000 mg | ORAL_TABLET | Freq: Once | ORAL | Status: AC
  Administered 2018-04-01: 5 mg via ORAL

## 2018-04-01 MED ORDER — TRAZODONE HCL 100 MG PO TABS: 100.0000 mg | ORAL_TABLET | Freq: Every evening | ORAL | Status: DC | PRN

## 2018-04-01 MED ORDER — DIVALPROEX SODIUM 500 MG PO DR TAB: 1000.0000 mg | DELAYED_RELEASE_TABLET | Freq: Every day | ORAL | Status: DC

## 2018-04-01 MED ORDER — BENZTROPINE MESYLATE 1 MG PO TABS
2.0000 mg | ORAL_TABLET | Freq: Every day | ORAL | Status: DC
  Administered 2018-04-01: 2 mg via ORAL
  Filled 2018-04-01: qty 2

## 2018-04-01 MED ORDER — ACETAMINOPHEN 325 MG PO TABS: 650.0000 mg | ORAL_TABLET | Freq: Four times a day (QID) | ORAL | Status: DC | PRN

## 2018-04-01 MED ORDER — BENZTROPINE MESYLATE 1 MG PO TABS: 2.0000 mg | ORAL_TABLET | Freq: Every day | ORAL | Status: DC

## 2018-04-01 MED ORDER — DIVALPROEX SODIUM 500 MG PO DR TAB
500.0000 mg | DELAYED_RELEASE_TABLET | Freq: Two times a day (BID) | ORAL | Status: DC
  Filled 2018-04-01: qty 1

## 2018-04-01 MED ORDER — DIVALPROEX SODIUM 500 MG PO DR TAB
1000.0000 mg | DELAYED_RELEASE_TABLET | Freq: Every day | ORAL | Status: DC
  Administered 2018-04-01: 1000 mg via ORAL
  Filled 2018-04-01: qty 2

## 2018-04-01 MED ORDER — LORAZEPAM 2 MG PO TABS
2.0000 mg | ORAL_TABLET | Freq: Once | ORAL | Status: AC
  Administered 2018-04-01: 2 mg via ORAL

## 2018-04-01 MED ORDER — HALOPERIDOL 5 MG PO TABS
ORAL_TABLET | ORAL | Status: AC
  Administered 2018-04-01: 5 mg via ORAL
  Filled 2018-04-01: qty 1

## 2018-04-01 MED ORDER — MIRTAZAPINE 15 MG PO TABS: 15.0000 mg | ORAL_TABLET | Freq: Every day | ORAL | Status: DC

## 2018-04-01 MED ORDER — MAGNESIUM HYDROXIDE 400 MG/5ML PO SUSP: 30.0000 mL | Freq: Every day | ORAL | Status: DC | PRN

## 2018-04-01 MED ORDER — MIRTAZAPINE 15 MG PO TABS
15.0000 mg | ORAL_TABLET | Freq: Every day | ORAL | Status: DC
  Administered 2018-04-01 – 2018-04-16 (×16): 15 mg via ORAL
  Filled 2018-04-01 (×16): qty 1

## 2018-04-01 MED ORDER — OLANZAPINE 10 MG PO TABS
20.0000 mg | ORAL_TABLET | Freq: Every day | ORAL | Status: DC
  Administered 2018-04-01 – 2018-04-10 (×10): 20 mg via ORAL
  Filled 2018-04-01 (×10): qty 2

## 2018-04-01 MED ORDER — ALUM & MAG HYDROXIDE-SIMETH 200-200-20 MG/5ML PO SUSP: 30.0000 mL | ORAL | Status: DC | PRN

## 2018-04-01 MED ORDER — HYDROXYZINE HCL 25 MG PO TABS: 50.0000 mg | ORAL_TABLET | Freq: Three times a day (TID) | ORAL | Status: DC | PRN

## 2018-04-01 MED ORDER — LORAZEPAM 1 MG PO TABS
ORAL_TABLET | ORAL | Status: AC
  Administered 2018-04-01: 2 mg via ORAL
  Filled 2018-04-01: qty 2

## 2018-04-01 NOTE — ED Provider Notes (Signed)
[facility] Emergency Department Provider Note   ____________________________________________    I have reviewed the triage vital signs and the nursing notes.   HISTORY  Chief Complaint Psychiatric Evaluation   Patient unable to provide significant history  HPI Peter Wilson is a 42 y.o. male with a history of schizophrenia who presents for evaluation.  Sister reports that [facility] but today he is more agitated than ever.  Patient is unable to provide significant history  Past Medical History:  Diagnosis Date  . Marijuana abuse   . Schizophrenia, paranoid (HCC)   . Tardive dyskinesia   . Tobacco abuse     Patient Active Problem List   Diagnosis Date Noted  . Noncompliance 04/10/2017  . Asthma 07/13/2016  . Schizoaffective disorder, bipolar type (HCC) 06/27/2016  . Tardive dyskinesia 09/15/2015  . Antisocial traits 09/15/2015  . Tobacco use disorder 09/14/2015  . Cannabis use disorder, moderate, dependence (HCC) 09/14/2015    Past Surgical History:  Procedure Laterality Date  . NO PAST SURGERIES      Prior to Admission medications   Medication Sig Start Date End Date Taking? Authorizing Provider  benztropine (COGENTIN) 2 MG tablet Take 1 tablet (2 mg total) by mouth at bedtime. 04/13/17   Jimmy FootmanHernandez-Gonzalez, Andrea, MD  divalproex (DEPAKOTE ER) 500 MG 24 hr tablet Take 2 tablets (1,000 mg total) by mouth at bedtime. 04/13/17   Jimmy FootmanHernandez-Gonzalez, Andrea, MD  haloperidol decanoate (HALDOL DECANOATE) 100 MG/ML injection Inject 2.5 mLs (250 mg total) into the muscle every 30 (thirty) days. Patient not taking: Reported on 04/09/2017 07/13/16   Clapacs, Jackquline DenmarkJohn T, MD  mirtazapine (REMERON) 15 MG tablet Take 1 tablet (15 mg total) by mouth at bedtime. 04/13/17   Jimmy FootmanHernandez-Gonzalez, Andrea, MD  OLANZapine (ZYPREXA) 20 MG tablet Take 1 tablet (20 mg total) by mouth at bedtime. 04/13/17   Jimmy FootmanHernandez-Gonzalez,  Andrea, MD     Allergies Penicillins  No family history on file.  Social History Social History   Tobacco Use  . Smoking status: Current Every Day Smoker    Packs/day: 2.00    Types: Cigarettes  . Smokeless tobacco: Never Used  Substance Use Topics  . Alcohol use: Yes    Alcohol/week: 0.0 oz  . Drug use: Yes    Types: Marijuana    Able to obtain review of systems     ____________________________________________   PHYSICAL EXAM:  VITAL SIGNS: ED Triage Vitals  Enc Vitals Group     BP      Pulse      Resp      Temp      Temp src      SpO2      Weight      Height      Head Circumference      Peak Flow      Pain Score      Pain Loc      Pain Edu?      Excl. in GC?     Constitutional: Alert  Eyes: Conjunctivae are normal.  Head: Atraumatic. Nose: No congestion/rhinnorhea. Mouth/Throat: Mucous membranes are moist.    Cardiovascular: Normal rate, regular rhythm. Grossly normal heart sounds.  Good peripheral circulation. Respiratory: Normal respiratory effort.  No retractions. Gastrointestinal: Soft and nontender. No distention.  No CVA tenderness.  Musculoskeletal:  Warm and well perfused Neurologic: No gross focal neurologic deficits are appreciated.  Skin:  Skin is warm, dry and intact.  No rash noted. Psychiatric: Bizarre tangential, disorientation  ____________________________________________   LABS (all labs ordered are listed, but only abnormal results are displayed)  Labs Reviewed - No data to display ____________________________________________  EKG None ____________________________________________  RADIOLOGY  None ____________________________________________   PROCEDURES  Procedure(s) performed: No  Procedures   Critical Care performed: No ____________________________________________   INITIAL IMPRESSION / ASSESSMENT AND PLAN / ED COURSE  Pertinent labs & imaging results that were available during my care of the patient  were reviewed by me and considered in my medical decision making (see chart for details).  Patient appears to be having an exacerbation of schizophrenia, I am consulted Dr. Toni Amend of psychiatry, will involuntarily commit this patient for safety    ____________________________________________   FINAL CLINICAL IMPRESSION(S) / ED DIAGNOSES  Final diagnoses:  Schizophrenia, unspecified type [facility])        Note:  This document was prepared using Dragon voice recognition software and may include unintentional dictation errors.    Jene Every, MD 04/01/18 [phone]

## 2018-04-01 NOTE — ED Notes (Signed)
BEHAVIORAL HEALTH ROUNDING Patient sleeping: Yes.   Patient alert and oriented: not applicable Behavior appropriate: Yes.  ; If no, describe:  Nutrition and fluids offered: no Toileting and hygiene offered: No Sitter present: not applicable Law enforcement present: Yes  

## 2018-04-01 NOTE — ED Triage Notes (Signed)
See first nurse note.  Pt discharged from [facility] yesterday afternoon.  Sister says pt is slurring speech at times and confused at times.

## 2018-04-01 NOTE — ED Notes (Signed)
Patient alternately walking in room and coming out in hall. Has taken po meds. Tech sitting with patient to converse while waiting for meds to take effect.

## 2018-04-01 NOTE — [facility] Suicide Risk Assessment (Addendum)
[facility]   Nursing information obtained from:    Demographic factors:    Current Mental Status:    Loss Factors:    Historical Factors:    Risk Reduction Factors:     Total Time spent with patient: 1 hour Principal Problem: Schizoaffective disorder, bipolar type (HCC) Diagnosis:   Patient Active Problem List   Diagnosis Date Noted  . Schizoaffective disorder, bipolar type (HCC) [F25.0] 06/27/2016    Priority: High  . Schizophrenia (HCC) [F20.9] 04/01/2018  . Noncompliance [Z91.19] 04/10/2017  . Asthma [J45.909] 07/13/2016  . Tardive dyskinesia [G24.01] 09/15/2015  . Antisocial traits [F60.2] 09/15/2015  . Tobacco use disorder [F17.200] 09/14/2015  . Cannabis use disorder, moderate, dependence (HCC) [F12.20] 09/14/2015   Subjective Data: agitation  Continued Clinical Symptoms:    The "Alcohol Use Disorders Identification Test", Guidelines for Use in Primary Care, Second Edition.  World Science writerHealth Organization [facility](WHO). Score between 0-7:  no or low risk or alcohol related problems. Score between 8-15:  moderate risk of alcohol related problems. Score between 16-19:  high risk of alcohol related problems. Score 20 or above:  warrants further diagnostic evaluation for alcohol dependence and treatment.   CLINICAL FACTORS:   Schizophrenia:   Less than 327 years old   Musculoskeletal: Strength & Muscle Tone: within normal limits Gait & Station: normal Patient leans: N/A  Psychiatric Specialty Exam: Physical Exam  Nursing note and vitals reviewed. Psychiatric: His affect is labile and inappropriate. His speech is rapid and/or pressured. He is hyperactive. Thought content is paranoid and delusional. Cognition and memory are normal. He expresses impulsivity.    Review of Systems  Neurological: Positive for headaches.  Psychiatric/Behavioral: Positive for hallucinations.  All other systems reviewed and are negative.   Blood pressure 99/67, pulse 70,  temperature (!) 97.5 F (36.4 C), temperature source Oral, resp. rate 18, height 6' (1.829 m), weight 74.4 kg (164 lb), SpO2 99 %.Body mass index is 22.24 kg/m.  General Appearance: Disheveled  Eye Contact:  Fair  Speech:  Pressured  Volume:  Increased  Mood:  Dysphoric and Irritable  Affect:  Congruent  Thought Process:  Goal Directed and Descriptions of Associations: Intact  Orientation:  Full (Time, Place, and Person)  Thought Content:  Delusions and Paranoid Ideation  Suicidal Thoughts:  No  Homicidal Thoughts:  No  Memory:  Immediate;   Poor Recent;   Poor Remote;   Poor  Judgement:  Poor  Insight:  Lacking  Psychomotor Activity:  Increased  Concentration:  Concentration: Poor and Attention Span: Poor  Recall:  Poor  Fund of Knowledge:  Fair  Language:  Fair  Akathisia:  No  Handed:  Right  AIMS (if indicated):     Assets:  Communication Skills Desire for Improvement Financial Resources/Insurance Housing Physical Health Resilience Social Support  ADL's:  Intact  Cognition:  WNL  Sleep:         COGNITIVE FEATURES THAT CONTRIBUTE TO RISK:  None    SUICIDE RISK:   Moderate:  Frequent suicidal ideation with limited intensity, and duration, some specificity in terms of plans, no associated intent, good self-control, limited dysphoria/symptomatology, some risk factors present, and identifiable protective factors, including available and accessible social support.  PLAN OF CARE: hospital admission, medication management, substance abuse counseling, discharge planning.  Peter Wilson is a 42 year old male with a history of schizofrenia brought to the ER by family for agitation just one day following discharge from another psychiatric facility. He reports tha he  no longer is on Depakote and that he missed his dose of Haldol.  #Mood and psychosis -continue Zyprexa 20 mg nightly -continue Depakote 500 mg BID, patient refuses -continue Remeron 15 mg nightly -restart  Haldol decanoate 250 mg every 3 or 4 weeks, reportedly given on 02/06/2018 -clonazepam 0.5 mg TID PRN  #Insomnia -Trazodone 100 mg nightly  #Mograine headache -Imitrex PRN  #Metabolic syndrome monitoring -lipid panel, TSH and HgbA1C are pending -EKG, pending  #Disposition -discharge to home with family -follow up with [facility]  I certify that inpatient services furnished can reasonably be expected to improve the patient's condition.   Kristine Linea, MD 04/01/2018, 10:48 PM

## 2018-04-01 NOTE — BH Assessment (Signed)
[facility] by Dr. Toni Amendlapacs.  Attending Physician will be Dr. Jennet MaduroPucilowska.   Patient has [facility] room 307[facility] StevensonPhyllis.   Intake Paper Work has been signed and placed on patient chart.  ER staff is aware of the admission:  [facility]   Dr. Lamont Snowballifenbark, ER MD   Selena BattenKim Patient's Nurse   Ms.Gwen Patient Access

## 2018-04-01 NOTE — ED Notes (Signed)
Patient sleeping soundly, resp unlabored, await lighten and will send urine and admit downstairs.

## 2018-04-01 NOTE — ED Triage Notes (Signed)
FIRST NURSE NOTE-[facility] per sister.  Sister reports he has not been acting right and will "go in and out". Will forget where he is and act like a child per sister. Pt appears to have drooped eyes and speak slowly. Ambulatory.  Placed in chairs in triage area to wait for triage.

## 2018-04-01 NOTE — Tx Team (Signed)
Initial Treatment Plan 04/01/2018 11:49 PM Peter Wilson [id number]    PATIENT STRESSORS: Financial difficulties Medication change or noncompliance   PATIENT STRENGTHS: Capable of independent living Motivation for treatment/growth Supportive family/friends   PATIENT IDENTIFIED PROBLEMS: Mood Insatbility  Ineffective Coping Skills  Anger/Aggression                 DISCHARGE CRITERIA:  Ability to meet basic life and health needs Improved stabilization in mood, thinking, and/or behavior Reduction of life-threatening or endangering symptoms to within safe limits  PRELIMINARY DISCHARGE PLAN: Outpatient therapy Return to previous living arrangement  PATIENT/FAMILY INVOLVEMENT: This treatment plan has been presented to and reviewed with the patient, Peter AreaQuincy Wilson.  The patient have been given the opportunity to ask questions and make suggestions.  Cleotis NipperAbiodun T Kiante Ciavarella, RN 04/01/2018, 11:49 PM

## 2018-04-01 NOTE — ED Notes (Signed)
BEHAVIORAL HEALTH ROUNDING Patient sleeping: No. Patient alert and oriented: yes Behavior appropriate: Yes.  ; If no, describe:  Nutrition and fluids offered: Yes  Toileting and hygiene offered: Yes  Sitter present: not applicable Law enforcement present: Yes  

## 2018-04-01 NOTE — Progress Notes (Signed)
Patient ID: Peter Wilson, male   DOB: February 15, 1976, 42 y.o.   MRN: [id number] Per State regulations 482.30 this chart was reviewed for medical necessity with respect to the patient's admission/duration of stay.    Next review date: 04/05/18  Thurman CoyerEric Salima Rumer, BSN, RN-BC  Case Manager

## 2018-04-01 NOTE — ED Notes (Signed)
IVC/ Consult completed/ Plan to admit when medically clear  

## 2018-04-01 NOTE — ED Notes (Signed)
Arrives to room 22, patient agitated, hyperactive and hyper verbal. States will not let us draw blood. Does change into scrubs with much encouragement. Sandwich and juice taken and agrees to take medication for calming.

## 2018-04-01 NOTE — ED Notes (Signed)
Continues to sleep, resp unlabored.

## 2018-04-01 NOTE — ED Notes (Signed)
Sister Larina Brasina Womack with patient on arrival. States patient was in [facility] and she went to pick him up yesterday. States patient is bipolar and schizophrenia. States takes Haldol injection but does not know when his last injection was. States he was discharged on trazadone 100mg  at [facility] and Benzotropine 1 mg BID. States she has not seen him this agitated in the past. States her phone number is [phone].

## 2018-04-01 NOTE — ED Notes (Signed)

## 2018-04-01 NOTE — ED Provider Notes (Signed)
Dr. Toni Amendlapacs recommends inpatient stabilization.   Peter Wilson, Peter Lish, MD 04/01/18 1546

## 2018-04-01 NOTE — ED Notes (Signed)
BEHAVIORAL HEALTH ROUNDING Patient sleeping: Yes.   Patient alert and oriented: not applicable SLEEPING Behavior appropriate: Yes.  ; If no, describe: SLEEPING Nutrition and fluids offered: No SLEEPING Toileting and hygiene offered: NoSLEEPING Sitter present: not applicable, Q 15 min safety rounds and observation. Law enforcement present: Yes ODS 

## 2018-04-01 NOTE — ED Notes (Signed)
PT  IVC  PENDING  GOING  TO  [facility]  TONIGHT 

## 2018-04-01 NOTE — Consult Note (Signed)
[place] Psychiatry Consult   Reason for Consult: Consult for 42 year old man brought in by his sister just 1 day after his most recent discharge with reports of agitated behavior Referring Physician: Rifenbark Patient Identification: Peter Wilson MRN:  [id number] Principal Diagnosis: Schizoaffective disorder, bipolar type [facility]) Diagnosis:   Patient Active Problem List   Diagnosis Date Noted  . Noncompliance [Z91.19] 04/10/2017  . Asthma [J45.909] 07/13/2016  . Schizoaffective disorder, bipolar type ([place]) [F25.0] 06/27/2016  . Tardive dyskinesia [G24.01] 09/15/2015  . Antisocial traits [F60.2] 09/15/2015  . Tobacco use disorder [F17.200] 09/14/2015  . Cannabis use disorder, moderate, dependence ([place]) [F12.20] 09/14/2015    Total Time spent with patient: 1 hour  Subjective:   Peter Wilson is a 42 y.o. male patient admitted with "I do not know why she brought me".  HPI: Patient interviewed chart reviewed.  Patient has schizophrenia and has had many prior visits to the hospital.  Sister brought him in with reports that he had been threatening and agitated at home just today after his most recent discharge from the hospital in the [place] area.  Patient is disorganized in his speech and not a very good historian.  He does say that he was just recently discharged.  Denies suicidal or homicidal thoughts.  Vague about hallucinations.  Says he is compliant with medicine.  Denies having any thoughts of hurting anyone but does seem belligerent and agitated.  Medical history: History of possible tardive dyskinesia cannabis abuse.  Substance abuse history: Cannabis abuse problems  Social history: Patient lives with his sister and her extended family when it is possible.  Past Psychiatric History: Patient has a history of schizophrenia and has had multiple prior hospitalizations.  Was apparently just at a facility in [place] until yesterday.  Positive past history of dangerous and  antisocial and agitated behavior no known suicide attempts  Risk to Self: Is patient at risk for suicide?: Yes Risk to Others:   Prior Inpatient Therapy:   Prior Outpatient Therapy:    Past Medical History:  Past Medical History:  Diagnosis Date  . Marijuana abuse   . Schizophrenia, paranoid ([place])   . Tardive dyskinesia   . Tobacco abuse     Past Surgical History:  Procedure Laterality Date  . NO PAST SURGERIES     Family History: No family history on file. Family Psychiatric  History: None known Social History:  Social History   Substance and Sexual Activity  Alcohol Use Yes  . Alcohol/week: 0.0 oz     Social History   Substance and Sexual Activity  Drug Use Yes  . Types: Marijuana    Social History   Socioeconomic History  . Marital status: Single    Spouse name: Not on file  . Number of children: Not on file  . Years of education: Not on file  . Highest education level: Not on file  Occupational History  . Not on file  Social Needs  . Financial resource strain: Not on file  . Food insecurity:    Worry: Not on file    Inability: Not on file  . Transportation needs:    Medical: Not on file    Non-medical: Not on file  Tobacco Use  . Smoking status: Current Every Day Smoker    Packs/day: 2.00    Types: Cigarettes  . Smokeless tobacco: Never Used  Substance and Sexual Activity  . Alcohol use: Yes    Alcohol/week: 0.0 oz  . Drug use: Yes  Types: Marijuana  . Sexual activity: Not on file  Lifestyle  . Physical activity:    Days per week: Not on file    Minutes per session: Not on file  . Stress: Not on file  Relationships  . Social connections:    Talks on phone: Not on file    Gets together: Not on file    Attends religious service: Not on file    Active member of club or organization: Not on file    Attends meetings of clubs or organizations: Not on file    Relationship status: Not on file  Other Topics Concern  . Not on file  Social  History Narrative  . Not on file   Additional Social History:    Allergies:   Allergies  Allergen Reactions  . Penicillins Rash    Labs:  Results for orders placed or performed during the hospital encounter of 04/01/18 (from the past 48 hour(s))  Comprehensive metabolic panel     Status: Abnormal   Collection Time: 04/01/18  3:44 PM  Result Value Ref Range   Sodium 138 135 - 145 mmol/L   Potassium 4.3 3.5 - 5.1 mmol/L   Chloride 102 101 - 111 mmol/L   CO2 30 22 - 32 mmol/L   Glucose, Bld 105 (H) 65 - 99 mg/dL   BUN 9 6 - 20 mg/dL   Creatinine, Ser 1.03 0.61 - 1.24 mg/dL   Calcium 9.4 8.9 - 10.3 mg/dL   Total Protein 7.6 6.5 - 8.1 g/dL   Albumin 4.4 3.5 - 5.0 g/dL   AST 32 15 - 41 U/L   ALT 9 (L) 17 - 63 U/L   Alkaline Phosphatase 49 38 - 126 U/L   Total Bilirubin 0.6 0.3 - 1.2 mg/dL   GFR calc non Af Amer >60 >60 mL/min   GFR calc Af Amer >60 >60 mL/min    Comment: (NOTE) The eGFR has been calculated using the CKD EPI equation. This calculation has not been validated in all clinical situations. eGFR's persistently <60 mL/min signify possible Chronic Kidney Disease.    Anion gap 6 5 - 15    Comment: Performed at [facility], [address] [place], [place] [address]  Ethanol     Status: None   Collection Time: 04/01/18  3:44 PM  Result Value Ref Range   Alcohol, Ethyl (B) <10 <10 mg/dL    Comment:        LOWEST DETECTABLE LIMIT FOR SERUM ALCOHOL IS 10 mg/dL FOR MEDICAL PURPOSES ONLY Performed at [facility], [address] [place], [place] [address]   CBC with Diff     Status: Abnormal   Collection Time: 04/01/18  3:44 PM  Result Value Ref Range   WBC 5.1 3.8 - 10.6 K/uL   RBC 4.28 (L) 4.40 - 5.90 MIL/uL   Hemoglobin 12.2 (L) 13.0 - 18.0 g/dL   HCT 37.6 (L) 40.0 - 52.0 %   MCV 87.7 80.0 - 100.0 fL   MCH 28.5 26.0 - 34.0 pg   MCHC 32.5 32.0 - 36.0 g/dL   RDW 14.1 11.5 - 14.5 %   Platelets 352 150 - 440 K/uL   Neutrophils Relative %  57 %   Neutro Abs 2.8 1.4 - 6.5 K/uL   Lymphocytes Relative 29 %   Lymphs Abs 1.5 1.0 - 3.6 K/uL   Monocytes Relative 10 %   Monocytes Absolute 0.5 0.2 - 1.0 K/uL   Eosinophils Relative 4 %  Eosinophils Absolute 0.2 0 - 0.7 K/uL   Basophils Relative 0 %   Basophils Absolute 0.0 0 - 0.1 K/uL    Comment: Performed at [facility], [address] [place], [place] [address]    Current Facility-Administered Medications  Medication Dose Route Frequency Provider Last Rate Last Dose  . benztropine (COGENTIN) tablet 2 mg  2 mg Oral QHS Maybelline Kolarik T, MD      . divalproex (DEPAKOTE) DR tablet 1,000 mg  1,000 mg Oral QHS Daniil Labarge T, MD      . mirtazapine (REMERON) tablet 15 mg  15 mg Oral QHS Rorie Delmore T, MD      . OLANZapine (ZYPREXA) tablet 20 mg  20 mg Oral QHS Shaun Zuccaro, Madie Reno, MD       Current Outpatient Medications  Medication Sig Dispense Refill  . benztropine (COGENTIN) 2 MG tablet Take 1 tablet (2 mg total) by mouth at bedtime. 30 tablet 0  . divalproex (DEPAKOTE ER) 500 MG 24 hr tablet Take 2 tablets (1,000 mg total) by mouth at bedtime. 60 tablet 0  . haloperidol decanoate (HALDOL DECANOATE) 100 MG/ML injection Inject 2.5 mLs (250 mg total) into the muscle every 30 (thirty) days. (Patient not taking: Reported on 04/09/2017) 2.5 mL 1  . mirtazapine (REMERON) 15 MG tablet Take 1 tablet (15 mg total) by mouth at bedtime. 30 tablet 0  . OLANZapine (ZYPREXA) 20 MG tablet Take 1 tablet (20 mg total) by mouth at bedtime. 30 tablet 0    Musculoskeletal: Strength & Muscle Tone: within normal limits Gait & Station: normal Patient leans: N/A  Psychiatric Specialty Exam: Physical Exam  Nursing note and vitals reviewed. Constitutional: He appears well-developed and well-nourished.  HENT:  Head: Normocephalic and atraumatic.    Eyes: Pupils are equal, round, and reactive to light. Conjunctivae are normal.  Neck: Normal range of motion.  Cardiovascular: Regular  rhythm and normal heart sounds.  Respiratory: Effort normal. No respiratory distress.  GI: Soft.  Musculoskeletal: Normal range of motion.  Neurological: He is alert.  Skin: Skin is warm and dry.  Psychiatric: His affect is blunt. His speech is delayed and tangential. He is withdrawn. Thought content is paranoid. Cognition and memory are impaired. He expresses impulsivity.    Review of Systems  Constitutional: Negative.   HENT: Negative.        Jaw pain on the left side  Eyes: Negative.   Respiratory: Negative.   Cardiovascular: Negative.   Gastrointestinal: Negative.   Musculoskeletal: Negative.   Skin: Negative.   Neurological: Negative.   Psychiatric/Behavioral: Positive for memory loss. Negative for depression, hallucinations, substance abuse and suicidal ideas. The patient is nervous/anxious and has insomnia.     There were no vitals taken for this visit.There is no height or weight on file to calculate BMI.  General Appearance: Disheveled  Eye Contact:  Fair  Speech:  Garbled and Slurred  Volume:  Decreased  Mood:  Anxious  Affect:  Congruent  Thought Process:  Disorganized  Orientation:  Full (Time, Place, and Person)  Thought Content:  Logical, Hallucinations: Auditory, Ideas of Reference:   Paranoia and Ilusions  Suicidal Thoughts:  No  Homicidal Thoughts:  No  Memory:  Immediate;   Fair Recent;   Fair Remote;   Fair  Judgement:  Impaired  Insight:  Shallow  Psychomotor Activity:  Decreased  Concentration:  Concentration: Fair  Recall:  [employer] of Knowledge:  Poor  Language:  Fair  Akathisia:  No  Handed:  Right  AIMS (if indicated):     Assets:  Desire for Improvement Social Support  ADL's:  Intact  Cognition:  Impaired,  Mild  Sleep:        Treatment Plan Summary: Daily contact with patient to assess and evaluate symptoms and progress in treatment, Medication management and Plan 42 year old man with schizophrenia.  Disorganized in his speech and  thinking.  Denies homicidal ideation but is cursing and agitated at times.  Appears to still be decompensated despite recent hospitalization.  Plan will be to admit him to the psychiatric ward.  15-minute checks.  Full set of labs.  Continue outpatient medicines on the plan that he was on last time he was here at our hospital  Disposition: Recommend psychiatric Inpatient admission when medically cleared. Supportive therapy provided about ongoing stressors.  Alethia Berthold, MD 04/01/2018 4:40 PM

## 2018-04-01 NOTE — ED Notes (Signed)

## 2018-04-01 NOTE — ED Notes (Signed)
Patient now calm, agrees to having blood drawn, blood drawn without incident.

## 2018-04-02 DIAGNOSIS — F250 Schizoaffective disorder, bipolar type: Principal | ICD-10-CM

## 2018-04-02 LAB — HEMOGLOBIN A1C
HEMOGLOBIN A1C: 4.6 % — AB (ref 4.8–5.6)
Mean Plasma Glucose: 85.32 mg/dL

## 2018-04-02 LAB — LIPID PANEL
Cholesterol: 202 mg/dL — ABNORMAL HIGH (ref 0–200)
HDL: 46 mg/dL (ref >40)
LDL Cholesterol: 136 mg/dL — ABNORMAL HIGH (ref 0–99)
Total CHOL/HDL Ratio: 4.4 RATIO
Triglycerides: 102 mg/dL (ref <150)
VLDL: 20 mg/dL (ref 0–40)

## 2018-04-02 LAB — VALPROIC ACID LEVEL: Valproic Acid Lvl: <10 ug/mL — ABNORMAL LOW (ref 50.0–100.0)

## 2018-04-02 LAB — TSH: TSH: 1.675 u[IU]/mL (ref 0.350–4.500)

## 2018-04-02 MED ORDER — OLANZAPINE 5 MG PO TBDP
15.0000 mg | ORAL_TABLET | Freq: Two times a day (BID) | ORAL | Status: DC | PRN
  Administered 2018-04-02: 15 mg via ORAL
  Filled 2018-04-02: qty 3

## 2018-04-02 MED ORDER — CLONAZEPAM 0.5 MG PO TABS
0.5000 mg | ORAL_TABLET | Freq: Three times a day (TID) | ORAL | Status: DC | PRN
  Administered 2018-04-02: 0.5 mg via ORAL
  Filled 2018-04-02: qty 1

## 2018-04-02 MED ORDER — HALOPERIDOL DECANOATE 100 MG/ML IM SOLN
250.0000 mg | INTRAMUSCULAR | Status: DC
  Administered 2018-04-02: 250 mg via INTRAMUSCULAR
  Filled 2018-04-02: qty 2.5

## 2018-04-02 MED ORDER — SUMATRIPTAN SUCCINATE 50 MG PO TABS
50.0000 mg | ORAL_TABLET | ORAL | Status: DC | PRN
  Administered 2018-04-02 – 2018-04-08 (×9): 50 mg via ORAL
  Filled 2018-04-02 (×9): qty 1

## 2018-04-02 MED ORDER — TRAZODONE HCL 100 MG PO TABS
100.0000 mg | ORAL_TABLET | Freq: Every day | ORAL | Status: DC
  Administered 2018-04-02 – 2018-04-16 (×15): 100 mg via ORAL
  Filled 2018-04-02 (×15): qty 1

## 2018-04-02 NOTE — [facility] Group Notes (Signed)
[facility] Group Notes:  (Nursing/MHT/Case Management/Adjunct)  Date:  04/02/2018  Time:  9:04 PM  Type of Therapy:  Group Therapy  Participation Level:  Active  Participation Quality:  Mad because can't wear blanket in Day room.  Affect:  Appropriate  Cognitive:  Appropriate  Insight:  Good  Engagement in Group:  Engaged  Modes of Intervention:  Activity  Summary of Progress/Problems:  Peter NeerJackie Wilson Peter Wilson 04/02/2018, 9:04 PM

## 2018-04-02 NOTE — Progress Notes (Signed)
Recreation Therapy Notes  Date: 04/02/2018  Time: 9:30 am   Location: Craft Room   Behavioral response: N/A   Intervention Topic: Goals   Discussion/Intervention: Patient did not attend group.   Clinical Observations/Feedback:  Patient did not attend group.   Dawnmarie Breon LRT/CTRS        Christabel Camire 04/02/2018 10:57 AM

## 2018-04-02 NOTE — Progress Notes (Signed)
Patient ID: Peter Wilson, male   DOB: 1976/04/12, 42 y.o.   MRN: [id number] [facility] for mood instability, ineffective coping skills; history of multiple psych admissions; recently [facility] and BIB sister for agitation and out of control behaviors. Clam on arrival, still disorganized but not aggressive. Skin assessment and contraband search completed with Trinna PostAlex, RN. Gross Skin Intact, no contraband found on patient and in his belongings. Hygiene products provided, treatment agreement & unit guidelines discussed, Unit orientation and room completed; medications given as ordered..Marland Kitchen

## 2018-04-02 NOTE — Progress Notes (Signed)
Haldol injection administered in L) deltoid. No adverse reaction noted at this time.

## 2018-04-02 NOTE — Progress Notes (Signed)
Recreation Therapy Notes  INPATIENT RECREATION THERAPY ASSESSMENT  Patient Details Name: Peter Wilson MRN: [id number] DOB: Nov 26, 1976 Today's Date: 04/02/2018   Patient stated he will not talk to anyone else after this assessment because he wants to be discharged.        Information Obtained From: Patient  Able to Participate in Assessment/Interview: Yes  Patient Presentation: Responsive  Reason for Admission (Per Patient): Active Symptoms, Impulsive Behavior  Patient Stressors: Family, Friends, Relationship  Coping Skills:   Substance Abuse  Leisure Interests (2+):  (None)  Frequency of Recreation/Participation:    Awareness of Community Resources:  No  Community Resources:     Current Use:    If no, Barriers?:    Expressed Interest in [employer] Resource Information: No  County [place]idence:  [place]  Patient Main Form of Transportation: Therapist, musicublic Transportation  Patient Strengths:  I am not a good person  Patient Identified Areas of Improvement:  Leave people alone  Patient Goal for Hospitalization:  To leave  Current SI (including self-harm):  No  Current HI:  No  Current AVH: No  Staff Intervention Plan: Group Attendance, Collaborate with Interdisciplinary Treatment Team  Consent to Intern Participation: N/A  Teri Legacy 04/02/2018, 11:42 AM

## 2018-04-02 NOTE — Plan of Care (Signed)
  Problem: Spiritual Needs Goal: Ability to function at adequate level Outcome: Progressing   Problem: Education: Goal: Knowledge of [employer] General Education information/materials will improve Outcome: Progressing Goal: Emotional status will improve Outcome: Progressing Goal: Mental status will improve Outcome: Progressing Goal: Verbalization of understanding the information provided will improve Outcome: Progressing   Problem: Activity: Goal: Interest or engagement in activities will improve Outcome: Progressing Goal: Sleeping patterns will improve Outcome: Progressing   Problem: Coping: Goal: Ability to verbalize frustrations and anger appropriately will improve Outcome: Progressing Goal: Ability to demonstrate self-control will improve Outcome: Progressing   Problem: Health Behavior/Discharge Planning: Goal: Identification of resources available to assist in meeting health care needs will improve Outcome: Progressing Goal: Compliance with treatment plan for underlying cause of condition will improve Outcome: Progressing   Problem: Physical Regulation: Goal: Ability to maintain clinical measurements within normal limits will improve Outcome: Progressing   Problem: Safety: Goal: Periods of time without injury will increase Outcome: Progressing   Problem: Activity: Goal: Will identify at least one activity in which they can participate Outcome: Progressing  Patient slept for Estimated Hours of 7; Precautionary checks every 15 minutes for safety maintained, room free of safety hazards, patient sustains no injury or falls during this shift.  Problem: Coping: Goal: Ability to identify and develop effective coping behavior will improve Outcome: Progressing Goal: Ability to interact with others will improve Outcome: Progressing Goal: Demonstration of participation in decision-making regarding own care will improve Outcome: Progressing Goal: Ability to use eye  contact when communicating with others will improve Outcome: Progressing   Problem: Health Behavior/Discharge Planning: Goal: Identification of resources available to assist in meeting health care needs will improve Outcome: Progressing   Problem: Self-Concept: Goal: Will verbalize positive feelings about self Outcome: Progressing

## 2018-04-02 NOTE — [facility] Group Notes (Signed)
04/02/2018 1PM  Type of Therapy/Topic:  Group Therapy:  Feelings about Diagnosis  Participation Level:  Did Not Attend   Description of Group:   This group will allow patients to explore their thoughts and feelings about diagnoses they have received. Patients will be guided to explore their level of understanding and acceptance of these diagnoses. Facilitator will encourage patients to process their thoughts and feelings about the reactions of others to their diagnosis and will guide patients in identifying ways to discuss their diagnosis with significant others in their lives. This group will be process-oriented, with patients participating in exploration of their own experiences, giving and receiving support, and processing challenge from other group members.   Therapeutic Goals: 1. Patient will demonstrate understanding of diagnosis as evidenced by identifying two or more symptoms of the disorder 2. Patient will be able to express two feelings regarding the diagnosis 3. Patient will demonstrate their ability to communicate their needs through discussion and/or role play  Summary of Patient Progress: Patient was encouraged and invited to attend group. Patient did not attend group. Social worker will continue to encourage group participation in the future.        Therapeutic Modalities:   Cognitive Behavioral Therapy Brief Therapy Feelings Identification    Johny ShearsCassandra  Saul Fabiano, [name] 04/02/2018 1:52 PM
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
## 2018-04-02 NOTE — BHH Group Notes (Signed)
  04/02/2018  Time: 0900  Type of Therapy and Topic: Group Therapy: Goals Group: SMART Goals   Participation Level:  Did Not Attend   Description of Group:   The purpose of a daily goals group is to assist and guide patients in setting recovery/wellness-related goals. The objective is to set goals as they relate to the crisis in which they were admitted. Patients will be using SMART goal modalities to set measurable goals. Characteristics of realistic goals will be discussed and patients will be assisted in setting and processing how one will reach their goal. Facilitator will also assist patients in applying interventions and coping skills learned in psycho-education groups to the SMART goal and process how one will achieve defined goal.   Therapeutic Goals:  -Patients will develop and document one goal related to or their crisis in which brought them into treatment.  -Patients will be guided by [name] using SMART goal setting modality in how to set a measurable, attainable, realistic and time sensitive goal.  -Patients will process barriers in reaching goal.  -Patients will process interventions in how to overcome and successful in reaching goal.   Patient's Goal:  Pt was invited to attend group but chose not to attend. CSW will continue to encourage pt to attend group throughout their admission.   Therapeutic Modalities:  Motivational Interviewing  Cognitive Behavioral Therapy  Crisis Intervention Model  SMART goals setting   Heidi DachKelsey Roshawn Ayala, MSW, [name] Clinical Social Worker 04/02/2018 9:32 AM

## 2018-04-02 NOTE — Plan of Care (Signed)
Patient up on the unit after breakfast with many complaints and exhibiting some irritability. Patient states, "I'm tired of all of you people trying to find out who the fuck I am, you need to find out who the fuck you are and leave me alone. I should have stayed on my room." Patient's mood is unstable at this time. Per Md, let me review your chart and I will get your orders in. Will continue to monitor.

## 2018-04-03 MED ORDER — LORAZEPAM 2 MG PO TABS
2.0000 mg | ORAL_TABLET | Freq: Four times a day (QID) | ORAL | Status: DC | PRN
  Administered 2018-04-03 – 2018-04-14 (×17): 2 mg via ORAL
  Filled 2018-04-03 (×16): qty 1

## 2018-04-03 MED ORDER — DIVALPROEX SODIUM 500 MG PO DR TAB
500.0000 mg | DELAYED_RELEASE_TABLET | Freq: Three times a day (TID) | ORAL | Status: DC
Start: 2018-04-03 — End: 2018-04-17
  Administered 2018-04-03 – 2018-04-17 (×41): 500 mg via ORAL
  Filled 2018-04-03 (×43): qty 1

## 2018-04-03 MED ORDER — ENSURE ENLIVE PO LIQD
237.0000 mL | Freq: Three times a day (TID) | ORAL | Status: DC
  Administered 2018-04-03 – 2018-04-16 (×39): 237 mL via ORAL

## 2018-04-03 MED ORDER — HALOPERIDOL 5 MG PO TABS
10.0000 mg | ORAL_TABLET | Freq: Four times a day (QID) | ORAL | Status: DC | PRN
  Administered 2018-04-03 – 2018-04-14 (×10): 10 mg via ORAL
  Filled 2018-04-03 (×11): qty 2
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
## 2018-04-03 NOTE — BHH Group Notes (Signed)
  [name] Group Therapy Note  04/03/2018 1:00pm  Type of Therapy/Topic:  Group Therapy:  Emotion Regulation  Participation Level:  Did Not Attend   Description of Group:   The purpose of this group is to assist patients in learning to regulate negative emotions and experience positive emotions. Patients will be guided to discuss ways in which they have been vulnerable to their negative emotions. These vulnerabilities will be juxtaposed with experiences of positive emotions or situations, and patients will be challenged to use positive emotions to combat negative ones. Special emphasis will be placed on coping with negative emotions in conflict situations, and patients will process healthy conflict resolution skills.  Therapeutic Goals: 1. Patient will identify two positive emotions or experiences to reflect on in order to balance out negative emotions 2. Patient will label two or more emotions that they find the most difficult to experience 3. Patient will demonstrate positive conflict resolution skills through discussion and/or role plays  Summary of Patient Progress:       Therapeutic Modalities:   Cognitive Behavioral Therapy Feelings Identification Dialectical Behavioral Therapy   Swathi Dauphin P Merrissa Giacobbe, [name] 04/03/2018 5:04 PM  

## 2018-04-03 NOTE — Tx Team (Addendum)
Interdisciplinary Treatment and Diagnostic Plan Update  04/03/2018 Time of Session: 10:30am Peter Wilson MRN: [id number]  Principal Diagnosis: Schizoaffective disorder, bipolar type (HCC)  Secondary Diagnoses: Principal Problem:   Schizoaffective disorder, bipolar type (HCC) Active Problems:   Tobacco use disorder   Cannabis use disorder, moderate, dependence (HCC)   Tardive dyskinesia   Noncompliance   Current Medications:  Current Facility-Administered Medications  Medication Dose Route Frequency Provider Last Rate Last Dose  . divalproex (DEPAKOTE) DR tablet 500 mg  500 mg Oral Q8H [name], [name], MD   500 mg at 04/03/18 1352  . feeding supplement (ENSURE ENLIVE) (ENSURE ENLIVE) liquid 237 mL  237 mL Oral TID BM [name], [name], MD      . haloperidol (HALDOL) tablet 10 mg  10 mg Oral Q6H PRN [name], [name], MD   10 mg at 04/03/18 1119  . haloperidol decanoate (HALDOL DECANOATE) 100 MG/ML injection 250 mg  250 mg Intramuscular Q28 days [name], [name], MD   250 mg at 04/02/18 1430  . LORazepam (ATIVAN) tablet 2 mg  2 mg Oral Q6H PRN [name], [name], MD   2 mg at 04/03/18 1119  . mirtazapine (REMERON) tablet 15 mg  15 mg Oral QHS Clapacs, [name], MD   15 mg at 04/02/18 2103  . OLANZapine (ZYPREXA) tablet 20 mg  20 mg Oral QHS Clapacs, [name], MD   20 mg at 04/02/18 2102  . SUMAtriptan (IMITREX) tablet 50 mg  50 mg Oral Q2H PRN [name], [name], MD   50 mg at 04/02/18 1142  . traZODone (DESYREL) tablet 100 mg  100 mg Oral QHS [name], [name], MD   100 mg at 04/02/18 2103   PTA Medications: Medications Prior to Admission  Medication Sig Dispense Refill Last Dose  . benztropine (COGENTIN) 2 MG tablet Take 1 tablet (2 mg total) by mouth at bedtime. 30 tablet 0   . divalproex (DEPAKOTE ER) 500 MG 24 hr tablet Take 2 tablets (1,000 mg total) by mouth at bedtime. 60 tablet 0   . haloperidol decanoate (HALDOL DECANOATE) 100 MG/ML injection Inject  2.5 mLs (250 mg total) into the muscle every 30 (thirty) days. (Patient not taking: Reported on 04/09/2017) 2.5 mL 1 Not Taking at Unknown time  . mirtazapine (REMERON) 15 MG tablet Take 1 tablet (15 mg total) by mouth at bedtime. 30 tablet 0   . OLANZapine (ZYPREXA) 20 MG tablet Take 1 tablet (20 mg total) by mouth at bedtime. 30 tablet 0     Patient Stressors: Financial difficulties Medication change or noncompliance  Patient Strengths: Capable of independent living Motivation for treatment/growth Supportive family/friends  Treatment Modalities: Medication Management, Group therapy, Case management,  1 to 1 session with clinician, Psychoeducation, Recreational therapy.   Physician Treatment Plan for Primary Diagnosis: Schizoaffective disorder, bipolar type (HCC) Long Term Goal(s): Improvement in symptoms so as ready for discharge Improvement in symptoms so as ready for discharge   Short Term Goals: Ability to identify changes in lifestyle to reduce recurrence of condition will improve Ability to verbalize feelings will improve Ability to disclose and discuss suicidal ideas Ability to demonstrate self-control will improve Ability to identify and develop effective coping behaviors will improve Ability to maintain clinical measurements within normal limits will improve Compliance with prescribed medications will improve Ability to identify triggers associated with substance abuse/mental health issues will improve Ability to identify changes in lifestyle to reduce recurrence of condition will improve Ability to demonstrate self-control will  improve Ability to identify triggers associated with substance abuse/mental health issues will improve  Medication Management: Evaluate patient's response, side effects, and tolerance of medication regimen.  Therapeutic Interventions: 1 to 1 sessions, Unit Group sessions and Medication administration.  Evaluation of Outcomes:  Progressing  Physician Treatment Plan for Secondary Diagnosis: Principal Problem:   Schizoaffective disorder, bipolar type (HCC) Active Problems:   Tobacco use disorder   Cannabis use disorder, moderate, dependence (HCC)   Tardive dyskinesia   Noncompliance  Long Term Goal(s): Improvement in symptoms so as ready for discharge Improvement in symptoms so as ready for discharge   Short Term Goals: Ability to identify changes in lifestyle to reduce recurrence of condition will improve Ability to verbalize feelings will improve Ability to disclose and discuss suicidal ideas Ability to demonstrate self-control will improve Ability to identify and develop effective coping behaviors will improve Ability to maintain clinical measurements within normal limits will improve Compliance with prescribed medications will improve Ability to identify triggers associated with substance abuse/mental health issues will improve Ability to identify changes in lifestyle to reduce recurrence of condition will improve Ability to demonstrate self-control will improve Ability to identify triggers associated with substance abuse/mental health issues will improve     Medication Management: Evaluate patient's response, side effects, and tolerance of medication regimen.  Therapeutic Interventions: 1 to 1 sessions, Unit Group sessions and Medication administration.  Evaluation of Outcomes: Progressing   RN Treatment Plan for Primary Diagnosis: Schizoaffective disorder, bipolar type (HCC) Long Term Goal(s): Knowledge of disease and therapeutic regimen to maintain health will improve  Short Term Goals: Ability to identify and develop effective coping behaviors will improve and Compliance with prescribed medications will improve  Medication Management: RN will administer medications as ordered by provider, will assess and evaluate patient's response and provide education to patient for prescribed medication. RN  will report any adverse and/or side effects to prescribing provider.  Therapeutic Interventions: 1 on 1 counseling sessions, Psychoeducation, Medication administration, Evaluate responses to treatment, Monitor vital signs and CBGs as ordered, Perform/monitor CIWA, COWS, AIMS and Fall Risk screenings as ordered, Perform wound care treatments as ordered.  Evaluation of Outcomes: Progressing   [name] Treatment Plan for Primary Diagnosis: Schizoaffective disorder, bipolar type (HCC) Long Term Goal(s): Safe transition to appropriate next level of care at discharge, Engage patient in therapeutic group addressing interpersonal concerns.  Short Term Goals: Engage patient in aftercare planning with referrals and resources, Identify triggers associated with mental health/substance abuse issues and Increase skills for wellness and recovery  Therapeutic Interventions: Assess for all discharge needs, 1 to 1 time with Social worker, Explore available resources and support systems, Assess for adequacy in community support network, Educate family and significant other(s) on suicide prevention, Complete Psychosocial Assessment, Interpersonal group therapy.  Evaluation of Outcomes: Progressing   Progress in Treatment: Attending groups: No. Participating in groups: No. Taking medication as prescribed: Yes. Toleration medication: Yes. Family/Significant other contact made: No, will contact:  when he allows Patient understands diagnosis: Yes. Discussing patient identified problems/goals with staff: Yes. Medical problems stabilized or resolved: Yes. Denies suicidal/homicidal ideation: Yes. Issues/concerns per patient self-inventory: No. Other:    New problem(s) identified: No, Describe:     New Short Term/Long Term Goal(s):to get back on medicine and get out of here.  Discharge Plan or Barriers: home with sister and follow up at [facility]  Reason for Continuation of Hospitalization:  Anxiety Depression Hallucinations  Estimated Length of Stay: 7 days  Recreational Therapy: Patient Stressors: Family,  Friends, Relationship Patient Goal: Patient will successfully identify 2 ways of making healthy decisions post d/c within 5 recreation therapy group sessions  Attendees: Patient:Peter  Wilson 04/03/2018 4:25 PM  Physician: Kristine Lineajolanta [name], MD 04/03/2018 4:25 PM  Nursing: Hulan AmatoGwen Farrish, RN 04/03/2018 4:25 PM  RN Care Manager: 04/03/2018 4:25 PM  Social Worker: Jake SharkSara Laws, [name] 04/03/2018 4:25 PM  Recreational Therapist:  04/03/2018 4:25 PM  Other: Heidi DachKelsey Craig, [name] 04/03/2018 4:25 PM  Other: Johny ShearsCassandra Jarrett, LCSWA  04/03/2018 4:25 PM  Other: 04/03/2018 4:25 PM    Scribe for Treatment Team: Glennon MacSara P Laws, [name] 04/03/2018 4:25 PM
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
## 2018-04-03 NOTE — Plan of Care (Signed)
Thought process  remains  Altered . Patient unable to process  information give to him   Information presented in concrete form    Problem: Spiritual Needs Goal: Ability to function at adequate level Outcome: Not Progressing   Problem: Education: Goal: Knowledge of [facility] Education information/materials will improve Outcome: Not Progressing Goal: Emotional status will improve Outcome: Not Progressing Goal: Mental status will improve Outcome: Not Progressing Goal: Verbalization of understanding the information provided will improve Outcome: Not Progressing   Problem: Activity: Goal: Interest or engagement in activities will improve Outcome: Not Progressing Goal: Sleeping patterns will improve Outcome: Not Progressing   Problem: Coping: Goal: Ability to verbalize frustrations and anger appropriately will improve Outcome: Not Progressing Goal: Ability to demonstrate self-control will improve Outcome: Not Progressing   Problem: Health Behavior/Discharge Planning: Goal: Identification of resources available to assist in meeting health care needs will improve Outcome: Not Progressing Goal: Compliance with treatment plan for underlying cause of condition will improve Outcome: Not Progressing   Problem: Physical Regulation: Goal: Ability to maintain clinical measurements within normal limits will improve Outcome: Not Progressing   Problem: Safety: Goal: Periods of time without injury will increase Outcome: Not Progressing   Problem: Activity: Goal: Will identify at least one activity in which they can participate Outcome: Not Progressing   Problem: Coping: Goal: Ability to identify and develop effective coping behavior will improve Outcome: Not Progressing Goal: Ability to interact with others will improve Outcome: Not Progressing Goal: Demonstration of participation in decision-making regarding own care will improve Outcome: Not Progressing Goal: Ability  to use eye contact when communicating with others will improve Outcome: Not Progressing   Problem: Health Behavior/Discharge Planning: Goal: Identification of resources available to assist in meeting health care needs will improve Outcome: Not Progressing   Problem: Self-Concept: Goal: Will verbalize positive feelings about self Outcome: Not Progressing

## 2018-04-03 NOTE — Plan of Care (Signed)
Patient is stable, talk loud and like to instigate problems with peers, and intrusive, talks about his fighting abilities if any body messes with him but patient is easily redirected and follows command, sleep long hours, denies any SI/HI and no signs of AVH. 15 minute safety checks is maintained, no  distress. Problem: Education: Goal: Knowledge of [facility] Education information/materials will improve Outcome: Progressing Goal: Emotional status will improve Outcome: Progressing Goal: Mental status will improve Outcome: Progressing Goal: Verbalization of understanding the information provided will improve Outcome: Progressing   Problem: Activity: Goal: Interest or engagement in activities will improve Outcome: Progressing Goal: Sleeping patterns will improve Outcome: Progressing   Problem: Coping: Goal: Ability to verbalize frustrations and anger appropriately will improve Outcome: Progressing Goal: Ability to demonstrate self-control will improve Outcome: Progressing   Problem: Health Behavior/Discharge Planning: Goal: Identification of resources available to assist in meeting health care needs will improve Outcome: Progressing Goal: Compliance with treatment plan for underlying cause of condition will improve Outcome: Progressing   Problem: Physical Regulation: Goal: Ability to maintain clinical measurements within normal limits will improve Outcome: Progressing   Problem: Safety: Goal: Periods of time without injury will increase Outcome: Progressing   Problem: Coping: Goal: Ability to identify and develop effective coping behavior will improve Outcome: Progressing Goal: Ability to interact with others will improve Outcome: Progressing

## 2018-04-04 MED ORDER — ZIPRASIDONE MESYLATE 20 MG IM SOLR
20.0000 mg | INTRAMUSCULAR | Status: DC | PRN
  Administered 2018-04-06: 20 mg via INTRAMUSCULAR
  Filled 2018-04-04: qty 20

## 2018-04-04 NOTE — [facility] Group Notes (Signed)
04/04/2018  Time: 1PM  Type of Therapy/Topic:  Group Therapy:  Balance in Life  Participation Level:  Did Not Attend  Description of Group:   This group will address the concept of balance and how it feels and looks when one is unbalanced. Patients will be encouraged to process areas in their lives that are out of balance and identify reasons for remaining unbalanced. Facilitators will guide patients in utilizing problem-solving interventions to address and correct the stressor making their life unbalanced. Understanding and applying boundaries will be explored and addressed for obtaining and maintaining a balanced life. Patients will be encouraged to explore ways to assertively make their unbalanced needs known to significant others in their lives, using other group members and facilitator for support and feedback.  Therapeutic Goals: 1. Patient will identify two or more emotions or situations they have that consume much of in their lives. 2. Patient will identify signs/triggers that life has become out of balance:  3. Patient will identify two ways to set boundaries in order to achieve balance in their lives:  4. Patient will demonstrate ability to communicate their needs through discussion and/or role plays  Summary of Patient Progress: Pt was invited to attend group but chose not to attend. CSW will continue to encourage pt to attend group throughout their admission.     Therapeutic Modalities:   Cognitive Behavioral Therapy Solution-Focused Therapy Assertiveness Training  Heidi DachKelsey Bernadette Armijo, MSW, [name] Clinical Social Worker 04/04/2018 1:57 PM

## 2018-04-04 NOTE — Progress Notes (Signed)
[facility] MD Progress Note  04/04/2018 2:51 PM Peter Wilson  MRN:  [id number]  Subjective:   Peter Wilson has been agitated since yesterday. He is loud, intrusive and difficult to redirect today. He received several injections of Haldol and Ativan without improvement. He demands to be discharged immediately, walks around with his coffee spilling it all over tha place and responds to show of force only. His thinking is completely disorganized and lose.  Principal Problem: Schizoaffective disorder, bipolar type (HCC) Diagnosis:   Patient Active Problem List   Diagnosis Date Noted  . Schizoaffective disorder, bipolar type (HCC) [F25.0] 06/27/2016    Priority: High  . Schizophrenia (HCC) [F20.9] 04/01/2018  . Noncompliance [Z91.19] 04/10/2017  . Asthma [J45.909] 07/13/2016  . Tardive dyskinesia [G24.01] 09/15/2015  . Antisocial traits [F60.2] 09/15/2015  . Tobacco use disorder [F17.200] 09/14/2015  . Cannabis use disorder, moderate, dependence (HCC) [F12.20] 09/14/2015   Total Time spent with patient: 15 minutes  Past Psychiatric History: schizoaffective disorder bipolar type.  Past Medical History:  Past Medical History:  Diagnosis Date  . Marijuana abuse   . Schizophrenia, paranoid (HCC)   . Tardive dyskinesia   . Tobacco abuse     Past Surgical History:  Procedure Laterality Date  . NO PAST SURGERIES     Family History: History reviewed. No pertinent family history. Family Psychiatric  History: none reported Social History:  Social History   Substance and Sexual Activity  Alcohol Use Yes  . Alcohol/week: 0.0 oz     Social History   Substance and Sexual Activity  Drug Use Yes  . Types: Marijuana    Social History   Socioeconomic History  . Marital status: Single    Spouse name: Not on file  . Number of children: Not on file  . Years of education: Not on file  . Highest education level: Not on file  Occupational History  . Not on file  Social Needs  . Financial  resource strain: Not on file  . Food insecurity:    Worry: Not on file    Inability: Not on file  . Transportation needs:    Medical: Not on file    Non-medical: Not on file  Tobacco Use  . Smoking status: Current Every Day Smoker    Packs/day: 2.00    Types: Cigarettes  . Smokeless tobacco: Never Used  Substance and Sexual Activity  . Alcohol use: Yes    Alcohol/week: 0.0 oz  . Drug use: Yes    Types: Marijuana  . Sexual activity: Not on file  Lifestyle  . Physical activity:    Days per week: Not on file    Minutes per session: Not on file  . Stress: Not on file  Relationships  . Social connections:    Talks on phone: Not on file    Gets together: Not on file    Attends religious service: Not on file    Active member of club or organization: Not on file    Attends meetings of clubs or organizations: Not on file    Relationship status: Not on file  Other Topics Concern  . Not on file  Social History Narrative  . Not on file   Additional Social History:                         Sleep: Fair  Appetite:  Poor  Current Medications: Current Facility-Administered Medications  Medication Dose Route Frequency Provider Last Rate Last  Dose  . divalproex (DEPAKOTE) DR tablet 500 mg  500 mg Oral Q8H Peter Osgood B, MD   500 mg at 04/04/18 1415  . feeding supplement (ENSURE ENLIVE) (ENSURE ENLIVE) liquid 237 mL  237 mL Oral TID BM Peter Wilson B, MD   237 mL at 04/04/18 1100  . haloperidol (HALDOL) tablet 10 mg  10 mg Oral Q6H PRN Peter Wilson B, MD   10 mg at 04/04/18 1235  . haloperidol decanoate (HALDOL DECANOATE) 100 MG/ML injection 250 mg  250 mg Intramuscular Q28 days Peter Wilson B, MD   250 mg at 04/02/18 1430  . LORazepam (ATIVAN) tablet 2 mg  2 mg Oral Q6H PRN Peter Wilson B, MD   2 mg at 04/04/18 1235  . mirtazapine (REMERON) tablet 15 mg  15 mg Oral QHS Peter Wilson, Peter DenmarkJohn T, MD   15 mg at 04/03/18 2009  . OLANZapine (ZYPREXA)  tablet 20 mg  20 mg Oral QHS Peter Wilson, Peter DenmarkJohn T, MD   20 mg at 04/03/18 2009  . SUMAtriptan (IMITREX) tablet 50 mg  50 mg Oral Q2H PRN Peter Ottey B, MD   50 mg at 04/02/18 1142  . traZODone (DESYREL) tablet 100 mg  100 mg Oral QHS Peter Vesey B, MD   100 mg at 04/03/18 2009  . ziprasidone (GEODON) injection 20 mg  20 mg Intramuscular Q4H PRN Peter Wilson B, MD        Lab Results: No results found for this or any previous visit (from the past 48 hour(s)).  Blood Alcohol level:  Lab Results  Component Value Date   ETH <10 04/01/2018   ETH <10 02/04/2018    Metabolic Disorder Labs: Lab Results  Component Value Date   HGBA1C 4.6 (L) 04/02/2018   MPG 85.32 04/02/2018   MPG 91 04/11/2017   Lab Results  Component Value Date   PROLACTIN 26.4 (H) 04/11/2017   PROLACTIN 40.2 (H) 01/27/2016   Lab Results  Component Value Date   CHOL 202 (H) 04/02/2018   TRIG 102 04/02/2018   HDL 46 04/02/2018   CHOLHDL 4.4 04/02/2018   VLDL 20 04/02/2018   LDLCALC 136 (H) 04/02/2018   LDLCALC 167 (H) 04/11/2017    Physical Findings: AIMS: Facial and Oral Movements Muscles of Facial Expression: None, normal Lips and Perioral Area: None, normal Jaw: None, normal Tongue: None, normal,Extremity Movements Upper (arms, wrists, hands, fingers): None, normal Lower (legs, knees, ankles, toes): None, normal, Trunk Movements Neck, shoulders, hips: None, normal, Overall Severity Severity of abnormal movements (highest score from questions above): None, normal Incapacitation due to abnormal movements: None, normal Patient's awareness of abnormal movements (rate only patient's report): No Awareness, Dental Status Current problems with teeth and/or dentures?: No  CIWA:    COWS:     Musculoskeletal: Strength & Muscle Tone: within normal limits Gait & Station: normal Patient leans: N/A  Psychiatric Specialty Exam: Physical Exam  Nursing note and vitals reviewed. Psychiatric: His  affect is labile and inappropriate. His speech is rapid and/or pressured. He is agitated and hyperactive. Thought content is paranoid and delusional. Cognition and memory are impaired. He expresses impulsivity.    Review of Systems  Neurological: Negative.   Psychiatric/Behavioral: Positive for hallucinations. The patient is nervous/anxious.   All other systems reviewed and are negative.   Blood pressure 115/81, pulse 75, temperature 97.6 F (36.4 C), temperature source Oral, resp. rate 18, height 6' (1.829 m), weight 74.4 kg (164 lb), SpO2 99 %.Body mass index is 22.24  kg/m.  General Appearance: Disheveled  Eye Contact:  Good  Speech:  Pressured  Volume:  Increased  Mood:  Angry, Dysphoric and Irritable  Affect:  Congruent  Thought Process:  Disorganized and Descriptions of Associations: Loose  Orientation:  Full (Time, Place, and Person)  Thought Content:  Delusions and Paranoid Ideation  Suicidal Thoughts:  No  Homicidal Thoughts:  No  Memory:  Immediate;   Poor Recent;   Poor Remote;   Poor  Judgement:  Poor  Insight:  Lacking  Psychomotor Activity:  Increased  Concentration:  Concentration: Poor and Attention Span: Poor  Recall:  Poor  Fund of Knowledge:  Fair  Language:  Fair  Akathisia:  No  Handed:  Right  AIMS (if indicated):     Assets:  Communication Skills Desire for Improvement Financial Resources/Insurance Housing Physical Health Resilience Social Support  ADL's:  Intact  Cognition:  WNL  Sleep:  Number of Hours: 8     Treatment Plan Summary: Daily contact with patient to assess and evaluate symptoms and progress in treatment and Medication management   Mr. Reichel is a 42 year old male with a history of schizofrenia brought to the ER by family for agitation just one day following discharge from another psychiatric facility. Today, he is more agitated and disorganized in his thinking.   #Agitation, severe -Haldol 10 mg and Ativan 2 mg PRN are  available but the patient does not respond -start Geodon 20 mg IM PRN  #Mood and psychosis, no improvement -continue Zyprexa 20 mg nightly -continue Depakote 500 mg TID -continue Remeron 15 mg nightly -continue Haldol decanoate 250 mg every 3 weeks, next injection on 4/30 -add oral haldol 10 mg in am  #Insomnia, improved with current medication -Trazodone 100 mg nightly  #Jaw pain, decreased appetite -offer Ensure  #Migraine headache, resolved -Imitrex PRN  #Metabolic syndrome monitoring, labs, EKG reviewed -lipid panel, TSH and HgbA1C are unremarkable -EKG QTc 441  #Disposition -discharge to home with family -follow up with [facility]     Kristine Linea, MD 04/04/2018, 2:51 PM

## 2018-04-04 NOTE — Plan of Care (Signed)
Patient has been able to function at an adequate level and verbalizes understanding of the general information that's been provided to him and all questions/concerns have been addressed and answered. Patient denies SI/HI/AVH as well as any signs/symptoms of depression/anxiety at this time. Patient stated that he didn't sleep last night.  Patient has been out in the milieu for meals, but he has not participated in unit groups nor did he go outside when the other members on the unit did. Patient has been staying to himself. Patient has had an outburst on the unit today, but after some interventions from staff he was able to calm down. Patient has been working to maintain his clinical measurements within normal limits. Patient has the ability to identify the available resources that can assist him in meeting his health care needs and patient has been in compliance with his prescribed therapeutic/medication thus far. Patient has used fair eye contact when communicating with this Clinical research associatewriter. Patient has stayed to himself today and remains safe on the unit at this time.  Problem: Spiritual Needs Goal: Ability to function at adequate level Outcome: Progressing   Problem: Education: Goal: Knowledge of [facility] Education information/materials will improve Outcome: Progressing Goal: Emotional status will improve Outcome: Progressing Goal: Mental status will improve Outcome: Progressing Goal: Verbalization of understanding the information provided will improve Outcome: Progressing   Problem: Activity: Goal: Interest or engagement in activities will improve Outcome: Progressing Goal: Sleeping patterns will improve Outcome: Progressing   Problem: Coping: Goal: Ability to verbalize frustrations and anger appropriately will improve Outcome: Progressing Goal: Ability to demonstrate self-control will improve Outcome: Progressing   Problem: Health Behavior/Discharge Planning: Goal: Identification  of resources available to assist in meeting health care needs will improve Outcome: Progressing Goal: Compliance with treatment plan for underlying cause of condition will improve Outcome: Progressing   Problem: Physical Regulation: Goal: Ability to maintain clinical measurements within normal limits will improve Outcome: Progressing   Problem: Safety: Goal: Periods of time without injury will increase Outcome: Progressing   Problem: Activity: Goal: Will identify at least one activity in which they can participate Outcome: Progressing   Problem: Coping: Goal: Ability to identify and develop effective coping behavior will improve Outcome: Progressing Goal: Ability to interact with others will improve Outcome: Progressing Goal: Demonstration of participation in decision-making regarding own care will improve Outcome: Progressing Goal: Ability to use eye contact when communicating with others will improve Outcome: Progressing   Problem: Health Behavior/Discharge Planning: Goal: Identification of resources available to assist in meeting health care needs will improve Outcome: Progressing   Problem: Self-Concept: Goal: Will verbalize positive feelings about self Outcome: Progressing

## 2018-04-04 NOTE — Progress Notes (Signed)
D- Patient alert and oriented. Patient presents in an agitated/angry mood on assessment stating to this writer that he didn't sleep last night and he is upset that he doesn't have his clothes, he hit a door at the end of a hallway on the unit stating "yeah, I did it, you can send whoever down here, I want my mother fucking clothes". Patient was very threatening and stated that he was going to hit someone. Patient stated "I just want y'all to leave me alone". Staff was able to get in touch with patient's sister and after talking with her about his clothing patient was able to calm down some. Patient denies SI, HI, AVH, and pain at this time. Patient also denies any signs/symptoms of depression/anxiety at this time. Patient has no stated goals at this time.  A- Scheduled medications administered to patient, per MD orders. Support and encouragement provided.  Routine safety checks conducted every 15 minutes.  Patient informed to notify staff with problems or concerns.  R- No adverse drug reactions noted. Patient contracts for safety at this time. Patient compliant with medications and treatment plan. Patient receptive, calm, and cooperative. Patient interacts well with others on the unit.  Patient remains safe at this time.

## 2018-04-04 NOTE — Progress Notes (Signed)
Recreation Therapy Notes  Date: 04/04/2018  Time: 9:30 am   Location: Craft Room   Behavioral response: N/A   Intervention Topic: Self-care   Discussion/Intervention: Patient did not attend group.   Clinical Observations/Feedback:  Patient did not attend group.          Neeraj Housand 04/04/2018 11:37 AM 

## 2018-04-04 NOTE — Plan of Care (Signed)
Patient slept for Estimated Hours of 8; Precautionary checks every 15 minutes for safety maintained, room free of safety hazards, patient sustains no injury or falls during this shift.  Problem: Spiritual Needs Goal: Ability to function at adequate level Outcome: Progressing   Problem: Education: Goal: Knowledge of [place] [facility] Education information/materials will improve Outcome: Progressing Goal: Emotional status will improve Outcome: Progressing Goal: Mental status will improve Outcome: Progressing Goal: Verbalization of understanding the information provided will improve Outcome: Progressing   Problem: Activity: Goal: Interest or engagement in activities will improve Outcome: Progressing Goal: Sleeping patterns will improve Outcome: Progressing   Problem: Coping: Goal: Ability to verbalize frustrations and anger appropriately will improve Outcome: Progressing Goal: Ability to demonstrate self-control will improve Outcome: Progressing   Problem: Health Behavior/Discharge Planning: Goal: Identification of resources available to assist in meeting health care needs will improve Outcome: Progressing Goal: Compliance with treatment plan for underlying cause of condition will improve Outcome: Progressing   Problem: Physical Regulation: Goal: Ability to maintain clinical measurements within normal limits will improve Outcome: Progressing   Problem: Safety: Goal: Periods of time without injury will increase Outcome: Progressing   Problem: Activity: Goal: Will identify at least one activity in which they can participate Outcome: Progressing   Problem: Coping: Goal: Ability to identify and develop effective coping behavior will improve Outcome: Progressing Goal: Ability to interact with others will improve Outcome: Progressing Goal: Demonstration of participation in decision-making regarding own care will improve Outcome: Progressing Goal: Ability to use eye  contact when communicating with others will improve Outcome: Progressing   Problem: Health Behavior/Discharge Planning: Goal: Identification of resources available to assist in meeting health care needs will improve Outcome: Progressing   Problem: Self-Concept: Goal: Will verbalize positive feelings about self Outcome: Progressing

## 2018-04-04 NOTE — Progress Notes (Signed)
Patient came to this writer with complaints of "I don't feel good, my head feels messed up. If I don't wake up in the morning that'll be on y'all". Patient asked this writer to take his vital signs and they were unremarkable. Patient was encouraged to lay down and see if he feels better and if not come back and let staff know of the changes.

## 2018-04-04 NOTE — [facility] Group Notes (Signed)
[facility] Group Notes:  (Nursing/MHT/Case Management/Adjunct)  Date:  04/04/2018  Time:  9:54 PM  Type of Therapy:  Group Therapy  Participation Level:  Did Not Attend  Summary of Progress/Problems:  Mayra NeerJackie L Allante Whitmire 04/04/2018, 9:54 PM

## 2018-04-04 NOTE — [facility] Group Notes (Signed)
[name] Group Therapy Note 04/04/2018 9:00 AM  Type of Therapy and Topic:  Group Therapy:  Setting Goals  Participation Level:  Did Not Attend  Description of Group: In this process group, patients discussed using strengths to work toward goals and address challenges.  Patients identified two positive things about themselves and one goal they were working on.  Patients were given the opportunity to share openly and support each other's plan for self-empowerment.  The group discussed the value of gratitude and were encouraged to have a daily reflection of positive characteristics or circumstances.  Patients were encouraged to identify a plan to utilize their strengths to work on current challenges and goals.  Therapeutic Goals 1. Patient will verbalize personal strengths/positive qualities and relate how these can assist with achieving desired personal goals 2. Patients will verbalize affirmation of peers plans for personal change and goal setting 3. Patients will explore the value of gratitude and positive focus as related to successful achievement of goals 4. Patients will verbalize a plan for regular reinforcement of personal positive qualities and circumstances.  Summary of Patient Progress: Peter Wilson was invited to today's group, but chose not to attend.      Therapeutic Modalities Cognitive Behavioral Therapy Motivational Interviewing    Alease FrameSonya S Annemarie Sebree, [name] 04/04/2018 2:10 PM

## 2018-04-04 NOTE — Progress Notes (Addendum)
Patient ID: Christofer Shen, male   DOB: [birth year], 42 y.o.   MRN: [id number] CSW met with pt at this request.  CSW was very angry, but not aggressive towards CSW. Pt appeared to be disorganized and delusional in his thought processes. He was very aggressive in his speak, but not directly towards CSW.  Pt insisted that his clothes had been "stolen" by someone on the unit and he wanted to call his sister, Gae Bon, who could verify that he had these clothes.  CSW assisted pt with contacting his sister, Gae Bon, but was unable to get in touch with her. CSW discussed with pt his refusal to give consent to [facility] to reach out to his sister who he was living with prior to his hospital admission. Pt gave CSW consent to contact his sister Sherre Poot.  CSW asked pt if his plan was to go back to live with his sister as he had also informed CSW Dossie Arbour that he did not wish to go back to live at his sister's house.  Pt informed CSW that he plans to go back to live with his sister following discharge.  Pt shared that he was not willing to sign anymore consents until "Monday" (assuming he is referring to 04/08/18).  CSW will follow-up with pt's sister and Teacher, music.

## 2018-04-04 NOTE — Progress Notes (Signed)
Patient ID: Peter Wilson, male   DOB: 1976/06/10, 42 y.o.   MRN: [id number] Disheveled, drooling, seeking a fight from peers and staffs, angry affect, pacing and cursing, agitated, communicating threat verbally, loud and intimidating peers and staffs. Requested for early medications, observed drooling, patient has h/o TD; may benefit from Benadryl.

## 2018-04-05 MED ORDER — HALOPERIDOL 5 MG PO TABS
10.0000 mg | ORAL_TABLET | Freq: Every day | ORAL | Status: DC
  Administered 2018-04-06 – 2018-04-17 (×13): 10 mg via ORAL
  Filled 2018-04-05 (×12): qty 2

## 2018-04-05 NOTE — Plan of Care (Signed)
Patient in room majority of day sleeping.  States that he is just tired and does not feel that well.  No group attendance.  Sarcastic.  Irritable.  Support offered.  Safety maintained.

## 2018-04-05 NOTE — Progress Notes (Signed)
D:Pt denies SI/HI/AVH. Pt is pleasant and cooperative, but minimally engaging during assessment. Pt. Spent a majority of the shift isolative and withdrawn to his room. Pt. Did drink his ensure and went for a walk with this Clinical research associatewriter. Pt. has no Complaints.  Patient Interaction appropriate and logical. Pt. Not able to provide urine sample this evening. Will continue to encourage compliance.   A: Q x 15 minute observation checks were completed for safety. Patient was provided with education. Patient was given scheduled medications. Patient  was encourage to attend groups, participate in unit activities and continue with plan of care.   R:Patient is complaint with medication and unit procedures. Pt. Does not go to groups.             Precautionary checks every 15 minutes for safety maintained, room free of safety hazards, patient sustains no injury or falls during this shift.

## 2018-04-05 NOTE — Progress Notes (Signed)
[facility] MD Progress Note  04/05/2018 1:59 PM Peter Wilson  MRN:  [id number]  Subjective:   Peter Wilson has a history of schizoaffective disorder off medications for 6 months or so. Does well on Haldol decanoate but needs 250 mg every 3 weeks. Agitated and mean. Restarted on Haldol dec, haldol PRN, Zyprexa and depakote.  Peter Wilson had a difficult day yesterday with agitation. He seems more subdued this morning, We expect escalation tonight. He denies any symptoms of depression, anxiety or psychosis, wants to go home and drive his sister's car. He slept better laast nightr with medications. Appetite is poor. There are no somatic complaints.   Principal Problem: Schizoaffective disorder, bipolar type (HCC) Diagnosis:   Patient Active Problem List   Diagnosis Date Noted  . Schizoaffective disorder, bipolar type (HCC) [F25.0] 06/27/2016    Priority: High  . Schizophrenia (HCC) [F20.9] 04/01/2018  . Noncompliance [Z91.19] 04/10/2017  . Asthma [J45.909] 07/13/2016  . Tardive dyskinesia [G24.01] 09/15/2015  . Antisocial traits [F60.2] 09/15/2015  . Tobacco use disorder [F17.200] 09/14/2015  . Cannabis use disorder, moderate, dependence (HCC) [F12.20] 09/14/2015   Total Time spent with patient: 15 minutes  Past Psychiatric History: schizophrenia  Past Medical History:  Past Medical History:  Diagnosis Date  . Marijuana abuse   . Schizophrenia, paranoid (HCC)   . Tardive dyskinesia   . Tobacco abuse     Past Surgical History:  Procedure Laterality Date  . NO PAST SURGERIES     Family History: History reviewed. No pertinent family history. Family Psychiatric  History: none Social History:  Social History   Substance and Sexual Activity  Alcohol Use Yes  . Alcohol/week: 0.0 oz     Social History   Substance and Sexual Activity  Drug Use Yes  . Types: Marijuana    Social History   Socioeconomic History  . Marital status: Single    Spouse name: Not on file  . Number of  children: Not on file  . Years of education: Not on file  . Highest education level: Not on file  Occupational History  . Not on file  Social Needs  . Financial resource strain: Not on file  . Food insecurity:    Worry: Not on file    Inability: Not on file  . Transportation needs:    Medical: Not on file    Non-medical: Not on file  Tobacco Use  . Smoking status: Current Every Day Smoker    Packs/day: 2.00    Types: Cigarettes  . Smokeless tobacco: Never Used  Substance and Sexual Activity  . Alcohol use: Yes    Alcohol/week: 0.0 oz  . Drug use: Yes    Types: Marijuana  . Sexual activity: Not on file  Lifestyle  . Physical activity:    Days per week: Not on file    Minutes per session: Not on file  . Stress: Not on file  Relationships  . Social connections:    Talks on phone: Not on file    Gets together: Not on file    Attends religious service: Not on file    Active member of club or organization: Not on file    Attends meetings of clubs or organizations: Not on file    Relationship status: Not on file  Other Topics Concern  . Not on file  Social History Narrative  . Not on file   Additional Social History:  Sleep: Fair  Appetite:  Poor  Current Medications: Current Facility-Administered Medications  Medication Dose Route Frequency Provider Last Rate Last Dose  . divalproex (DEPAKOTE) DR tablet 500 mg  500 mg Oral Q8H Rotha Cassels B, MD   500 mg at 04/05/18 0615  . feeding supplement (ENSURE ENLIVE) (ENSURE ENLIVE) liquid 237 mL  237 mL Oral TID BM Leena Tiede B, MD   237 mL at 04/04/18 2020  . haloperidol (HALDOL) tablet 10 mg  10 mg Oral Q6H PRN Jahziel Sinn B, MD   10 mg at 04/04/18 2128  . haloperidol decanoate (HALDOL DECANOATE) 100 MG/ML injection 250 mg  250 mg Intramuscular Q28 days Westin Knotts B, MD   250 mg at 04/02/18 1430  . LORazepam (ATIVAN) tablet 2 mg  2 mg Oral Q6H PRN  Gervis Gaba B, MD   2 mg at 04/04/18 2128  . mirtazapine (REMERON) tablet 15 mg  15 mg Oral QHS Clapacs, [name], MD   15 mg at 04/04/18 2128  . OLANZapine (ZYPREXA) tablet 20 mg  20 mg Oral QHS Clapacs, [name], MD   20 mg at 04/04/18 2128  . SUMAtriptan (IMITREX) tablet 50 mg  50 mg Oral Q2H PRN Shakira Los B, MD   50 mg at 04/02/18 1142  . traZODone (DESYREL) tablet 100 mg  100 mg Oral QHS Kelson Queenan B, MD   100 mg at 04/04/18 2128  . ziprasidone (GEODON) injection 20 mg  20 mg Intramuscular Q4H PRN Teyton Pattillo B, MD        Lab Results: No results found for this or any previous visit (from the past 48 hour(s)).  Blood Alcohol level:  Lab Results  Component Value Date   ETH <10 04/01/2018   ETH <10 02/04/2018    Metabolic Disorder Labs: Lab Results  Component Value Date   HGBA1C 4.6 (L) 04/02/2018   MPG 85.32 04/02/2018   MPG 91 04/11/2017   Lab Results  Component Value Date   PROLACTIN 26.4 (H) 04/11/2017   PROLACTIN 40.2 (H) 01/27/2016   Lab Results  Component Value Date   CHOL 202 (H) 04/02/2018   TRIG 102 04/02/2018   HDL 46 04/02/2018   CHOLHDL 4.4 04/02/2018   VLDL 20 04/02/2018   LDLCALC 136 (H) 04/02/2018   LDLCALC 167 (H) 04/11/2017    Physical Findings: AIMS: Facial and Oral Movements Muscles of Facial Expression: None, normal Lips and Perioral Area: None, normal Jaw: None, normal Tongue: None, normal,Extremity Movements Upper (arms, wrists, hands, fingers): None, normal Lower (legs, knees, ankles, toes): None, normal, Trunk Movements Neck, shoulders, hips: None, normal, Overall Severity Severity of abnormal movements (highest score from questions above): None, normal Incapacitation due to abnormal movements: None, normal Patient's awareness of abnormal movements (rate only patient's report): No Awareness, Dental Status Current problems with teeth and/or dentures?: No  CIWA:    COWS:     Musculoskeletal: Strength &  Muscle Tone: within normal limits Gait & Station: normal Patient leans: N/A  Psychiatric Specialty Exam: Physical Exam  Nursing note and vitals reviewed. Psychiatric: His affect is angry, labile and inappropriate. His speech is rapid and/or pressured and tangential. He is agitated and hyperactive. Thought content is paranoid and delusional. Cognition and memory are impaired. He expresses impulsivity.    Review of Systems  Neurological: Negative.   Psychiatric/Behavioral: Positive for hallucinations.  All other systems reviewed and are negative.   Blood pressure 112/81, pulse 93, temperature 97.8 F (36.6 C), temperature source Oral,  resp. rate 18, height 6' (1.829 m), weight 74.4 kg (164 lb), SpO2 100 %.Body mass index is 22.24 kg/m.  General Appearance: Fairly Groomed  Eye Contact:  Good  Speech:  Pressured  Volume:  Increased  Mood:  Angry, Dysphoric, Euphoric and Irritable  Affect:  Congruent  Thought Process:  Disorganized, Irrelevant and Descriptions of Associations: Loose  Orientation:  Full (Time, Place, and Person)  Thought Content:  Delusions and Paranoid Ideation  Suicidal Thoughts:  No  Homicidal Thoughts:  No  Memory:  Immediate;   Poor Recent;   Poor Remote;   Poor  Judgement:  Poor  Insight:  Lacking  Psychomotor Activity:  Increased  Concentration:  Concentration: Poor and Attention Span: Poor  Recall:  Poor  Fund of Knowledge:  Poor  Language:  Poor  Akathisia:  No  Handed:  Right  AIMS (if indicated):     Assets:  Communication Skills Desire for Improvement Financial Resources/Insurance Housing Physical Health Resilience Social Support  ADL's:  Intact  Cognition:  WNL  Sleep:  Number of Hours: 6.75     Treatment Plan Summary: Daily contact with patient to assess and evaluate symptoms and progress in treatment and Medication management   Peter Wilson is a 42 year old male with a history of schizofrenia brought to the ER by family for agitation  just one day following discharge from another psychiatric facility.Today, he is less agitated but still completely disorganized in his thinking with no insight into his problems.    #Agitation, severe -Haldol 10 mg and Ativan 2 mg PRN are available but the patient does not respond -Geodon 20 mg IM PRN is available for severe agitation  #Mood and psychosis, no improvement -continue Zyprexa 20 mg nightly -continue Depakote 500 mg TID -continue Remeron 15 mg nightly -continueHaldol decanoate 250 mg every 3 weeks,next injection on 4/30 -add oral haldol 10 mg in am  #Insomnia, improved with current medication -Trazodone 100 mg nightly  #Jaw pain from fracture 2 months ago, decreased appetite -offer Ensure  #Migraine headache, resolved -Imitrex PRN  #Metabolic syndrome monitoring, labs, EKG reviewed -lipid panel, TSH and HgbA1C areunremarkable -EKGQTc 441  #Disposition -discharge to home with family -follow up with [facility]     Kristine LineaJolanta Demarlo Riojas, MD 04/05/2018, 1:59 PM

## 2018-04-05 NOTE — Plan of Care (Signed)
Pt. Verbalizes understanding of provided education. Pt. Isolative and withdrawn this evening frequently sleeping a majority of the shift. Pt. Compliant with medications this evening. Pt. Denies SI/HI this evening. Pt. Verbally is able to contract for safety and reports he can remain safe while on the unit. Pt. Does not attend groups.    Problem: Activity: Goal: Interest or engagement in activities will improve Outcome: Not Progressing    Problem: Education: Goal: Knowledge of [place] [facility] Education information/materials will improve Outcome: Progressing   Problem: Activity: Goal: Sleeping patterns will improve Outcome: Progressing   Problem: Health Behavior/Discharge Planning: Goal: Compliance with treatment plan for underlying cause of condition will improve Outcome: Progressing   Problem: Safety: Goal: Periods of time without injury will increase Outcome: Progressing

## 2018-04-05 NOTE — Progress Notes (Signed)
Patient is alert and oriented x 4 denies pain or discomfort, affect is blunted, mood is irritable angry, argumentative, using profanities and verbally aggressive with staff. Patient was frequently redirected and limit was also set during communication with patient. Patient's thoughts are disorganized and tangential, speech is loud. Patient was noted to be disruptive earlier in the milieu.  Patient was encouraged to attend evening groups, and medication was also given.  Patient did not attend group, however he was complaint with medication. No distress noted at tis time 15 minutes safety rounds maintained will continue to monitor.

## 2018-04-05 NOTE — [facility] Group Notes (Signed)
04/05/2018 1PM  Type of Therapy and Topic:  Group Therapy:  Feelings around Relapse and Recovery  Participation Level:  Did Not Attend   Description of Group:    Patients in this group will discuss emotions they experience before and after a relapse. They will process how experiencing these feelings, or avoidance of experiencing them, relates to having a relapse. Facilitator will guide patients to explore emotions they have related to recovery. Patients will be encouraged to process which emotions are more powerful. They will be guided to discuss the emotional reaction significant others in their lives may have to patients' relapse or recovery. Patients will be assisted in exploring ways to respond to the emotions of others without this contributing to a relapse.  Therapeutic Goals: 1. Patient will identify two or more emotions that lead to a relapse for them 2. Patient will identify two emotions that result when they relapse 3. Patient will identify two emotions related to recovery 4. Patient will demonstrate ability to communicate their needs through discussion and/or role plays   Summary of Patient Progress: Patient was encouraged and invited to attend group. Patient did not attend group. Social worker will continue to encourage group participation in the future.     Therapeutic Modalities:   Cognitive Behavioral Therapy Solution-Focused Therapy Assertiveness Training Relapse Prevention Therapy   Algernon Mundie, [name] 04/05/2018 2:32 PM    

## 2018-04-05 NOTE — Progress Notes (Signed)
Patient ID: Peter Wilson, male   DOB: [birth year], 42 y.o.   MRN: [id number] PER STATE REGULATIONS 482.30  THIS CHART WAS REVIEWED FOR MEDICAL NECESSITY WITH RESPECT TO THE PATIENT'S ADMISSION/ DURATION OF STAY.  NEXT REVIEW DATE: 04/09/2018  Willa RoughJENNIFER JONES Peter Wiederhold, RN, BSN CASE MANAGER

## 2018-04-05 NOTE — Progress Notes (Signed)
Recreation Therapy Notes   Date: 04/05/2018  Time: 9:30 am   Location: Craft Room   Behavioral response: N/A   Intervention Topic: Stress   Discussion/Intervention: Patient did not attend group.   Clinical Observations/Feedback:  Patient did not attend group.   Dearis Danis LRT/CTRS        Regginald Pask 04/05/2018 12:22 PM 

## 2018-04-06 MED ORDER — DIPHENHYDRAMINE HCL 25 MG PO CAPS
50.0000 mg | ORAL_CAPSULE | Freq: Four times a day (QID) | ORAL | Status: DC | PRN
  Administered 2018-04-06 – 2018-04-14 (×7): 50 mg via ORAL
  Filled 2018-04-06 (×6): qty 2

## 2018-04-06 MED ORDER — DIPHENHYDRAMINE HCL 25 MG PO CAPS
ORAL_CAPSULE | ORAL | Status: AC
  Filled 2018-04-06: qty 2

## 2018-04-06 NOTE — Progress Notes (Signed)
Hourly rounding 1300  placed on 1:1 1400   Sitting on side of bed talking to sitter 1500 Sitting on floor in doorway to room talking to sitter 1600 Sitting on floor in doorway to room talking to sitter 1700 In dayroom #2 watching TV with sitter 1800 In dayroom #2 watching TV with sitter 1900 In dayroom #2 watching TV with sitter

## 2018-04-06 NOTE — Plan of Care (Signed)
Currently on 1:1 secondary to recent aggressive behavior. Pleasant and cooperative.
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
## 2018-04-06 NOTE — [facility] Group Notes (Signed)
Patient ID: Peter Wilson, male   DOB: 09-29-1976, 42 y.o.   MRN: [id number]  CS[facility]. CSW called and spoke with [facility]Marie [facility] at [phone] - verified fax was received. Hilda LiasMarie took CSW and nurse station contact information and indicated that a nurse will be calling back once the information has been reviewed and a bed becomes available.  Johnnye Simannia Cuebas-Colon, LCSWA 04/06/2018

## 2018-04-06 NOTE — Plan of Care (Signed)
  Problem: Health Behavior/Discharge Planning: Goal: Compliance with treatment plan for underlying cause of condition will improve Outcome: Progressing Note:  Medication compliant   Problem: Physical Regulation: Goal: Ability to maintain clinical measurements within normal limits will improve Outcome: Progressing   Problem: Safety: Goal: Periods of time without injury will increase Outcome: Progressing   Problem: Education: Goal: Emotional status will improve Outcome: Not Progressing Goal: Mental status will improve Outcome: Not Progressing   Problem: Activity: Goal: Interest or engagement in activities will improve Outcome: Not Progressing   Problem: Coping: Goal: Ability to verbalize frustrations and anger appropriately will improve Outcome: Not Progressing Goal: Ability to demonstrate self-control will improve Outcome: Not Progressing   Problem: Coping: Goal: Ability to identify and develop effective coping behavior will improve Outcome: Not Progressing Goal: Ability to interact with others will improve Outcome: Not Progressing
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
## 2018-04-06 NOTE — [facility] Group Notes (Signed)
[name] Group Therapy Note 04/06/2018 1:15pm  Type of Therapy and Topic: Group Therapy: Feelings Around Returning Home & Establishing a Supportive Framework and Supporting Oneself When Supports Not Available  Participation Level: Did Not Attend  Description of Group:  Patients first processed thoughts and feelings about upcoming discharge. These included fears of upcoming changes, lack of change, new living environments, judgements and expectations from others and overall stigma of mental health issues. The group then discussed the definition of a supportive framework, what that looks and feels like, and how do to discern it from an unhealthy non-supportive network. The group identified different types of supports as well as what to do when your family/friends are less than helpful or unavailable  Therapeutic Goals  1. Patient will identify one healthy supportive network that they can use at discharge. 2. Patient will identify one factor of a supportive framework and how to tell it from an unhealthy network. 3. Patient able to identify one coping skill to use when they do not have positive supports from others. 4. Patient will demonstrate ability to communicate their needs through discussion and/or role plays.  Summary of Patient Progress:    Therapeutic Modalities Cognitive Behavioral Therapy Motivational Interviewing   Peter Wilson  CUEBAS-COLON, [name] 04/06/2018 4:07 PM
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
## 2018-04-06 NOTE — Progress Notes (Signed)
Patient currently in bed. Requested HS medications early "so I go to sleep". Received all his medications and currently reports that he is about to fall asleep. Remains on 1:1 for potential aggressive behavior.

## 2018-04-06 NOTE — [facility] Group Notes (Signed)
[facility] Group Notes:  (Nursing/MHT/Case Management/Adjunct)  Date:  04/06/2018  Time:  9:52 PM  Type of Therapy:  Group Therapy  Participation Level:  Did Not Attend  Mayra NeerJackie L Jodee Wagenaar 04/06/2018, 9:52 PM

## 2018-04-07 LAB — VALPROIC ACID LEVEL: VALPROIC ACID LVL: 98 ug/mL (ref 50.0–100.0)

## 2018-04-07 MED ORDER — PHENYLEPHRINE HCL 10 MG PO TABS
10.0000 mg | ORAL_TABLET | ORAL | Status: DC | PRN
  Administered 2018-04-07 – 2018-04-13 (×3): 10 mg via ORAL
  Filled 2018-04-07 (×5): qty 1

## 2018-04-07 NOTE — [facility] Group Notes (Signed)
[name] Group Therapy Note 04/07/2018 1:00pm Type of Therapy and Topic:  Group Therapy:  Communication Participation Level:  Did Not Attend  Description of Group: Patients will identify how individuals communicate with one another appropriately and inappropriately.  Patients will be guided to discuss their thoughts, feelings and behaviors related to barriers when communicating.  The group will process together ways to execute positive and appropriate communication with attention given to how one uses behavior, tone and body language.  Patients will be encouraged to reflect on a situation where they were successfully able to communicate and what made this example successful.  Group will identify specific changes they are motivated to make in order to overcome communication barriers with self, peers, authority, and parents.  This group will be process-oriented with patients participating in exploration of their own experiences, giving and receiving support, and challenging self and other group members.   Therapeutic Goals 1. Patient will express an understanding of the four types of verbal communication (passive, aggressive, passive aggressive, and assertive).  2. Patient will identify feelings (such as fear or worry), thought process and behaviors related to why people internalize feelings rather than express self openly. 3. Patient will identify their main communication style, and report how they plan to change their style to assertive. 4. Members will then practice through role play how to communicate using I statements, I feel statements, and acknowledging feelings rather than displacing feelings on others Summary of Patient Progress: Pt was invited to attend group but chose not to attend. CSW will continue to encourage pt to attend group throughout their admission.    Therapeutic Modalities Cognitive Behavioral Therapy Motivational Interviewing Solution Focused Therapy  Heidi DachKelsey Aldon Hengst, MSW,  [name] Clinical Social Worker 04/07/2018 1:55 PM

## 2018-04-07 NOTE — Progress Notes (Signed)
Patient currently in bed asleep. No sign of distress. Remains on 1:1 observation for safety.

## 2018-04-07 NOTE — Progress Notes (Signed)
Patient in bed sleeping. No sign of distress. Remains on 1:1 for safety.  

## 2018-04-07 NOTE — Progress Notes (Signed)
Patient stayed in the milieu, pleasant and cooperative. Alert and oriented. Denying homicidal thoughts. Denying suicidal thoughts and hallucinations. Pt is able to express his needs appropriately. Was visible in evening activities, had a snack and received medications. Contracts for safety. Currently in bed sleeping and staff continue to monitor.

## 2018-04-07 NOTE — Progress Notes (Signed)
Patient has slept throughout the night and appeared to be comfortable in bed. No aggressive behavior noted for the entire shift: Was pleasant and cooperative. Staff continue to monitor on 1:1.

## 2018-04-07 NOTE — Progress Notes (Signed)
[facility]  04/07/2018 4:50 PM Peter AreaQuincy Blanchette  MRN:  [id number] Subjective:   I dont feel good, when I can go from here?" Pt calmer, has not been aggressive although remain irritable, labile, paranoid.  Pt with sitter. Denies SI/HI, contracts for safety. Pt taking meds, denies side effects.   Reports hearing voices "from time to time", didn't elaborate.  Principal Problem: Schizoaffective disorder, bipolar type (HCC) Diagnosis:   Patient Active Problem List   Diagnosis Date Noted  . Schizophrenia (HCC) [F20.9] 04/01/2018  . Noncompliance [Z91.19] 04/10/2017  . Asthma [J45.909] 07/13/2016  . Schizoaffective disorder, bipolar type (HCC) [F25.0] 06/27/2016  . Tardive dyskinesia [G24.01] 09/15/2015  . Antisocial traits [F60.2] 09/15/2015  . Tobacco use disorder [F17.200] 09/14/2015  . Cannabis use disorder, moderate, dependence (HCC) [F12.20] 09/14/2015   Total Time spent with patient: 30 minutes  Past Psychiatric History: no ne winfo  Past Medical History:  Past Medical History:  Diagnosis Date  . Marijuana abuse   . Schizophrenia, paranoid (HCC)   . Tardive dyskinesia   . Tobacco abuse     Past Surgical History:  Procedure Laterality Date  . NO PAST SURGERIES     Family History: History reviewed. No pertinent family history. Family Psychiatric  History: no new info Social History:  Social History   Substance and Sexual Activity  Alcohol Use Yes  . Alcohol/week: 0.0 oz     Social History   Substance and Sexual Activity  Drug Use Yes  . Types: Marijuana    Social History   Socioeconomic History  . Marital status: Single    Spouse name: Not on file  . Number of children: Not on file  . Years of education: Not on file  . Highest education level: Not on file  Occupational History  . Not on file  Social Needs  . Financial resource strain: Not on file  . Food insecurity:    Worry: Not on file    Inability: Not on file  . Transportation needs:   Medical: Not on file    Non-medical: Not on file  Tobacco Use  . Smoking status: Current Every Day Smoker    Packs/day: 2.00    Types: Cigarettes  . Smokeless tobacco: Never Used  Substance and Sexual Activity  . Alcohol use: Yes    Alcohol/week: 0.0 oz  . Drug use: Yes    Types: Marijuana  . Sexual activity: Not on file  Lifestyle  . Physical activity:    Days per week: Not on file    Minutes per session: Not on file  . Stress: Not on file  Relationships  . Social connections:    Talks on phone: Not on file    Gets together: Not on file    Attends religious service: Not on file    Active member of club or organization: Not on file    Attends meetings of clubs or organizations: Not on file    Relationship status: Not on file  Other Topics Concern  . Not on file  Social History Narrative  . Not on file   Additional Social History:                         Sleep: Good  Appetite:  Fair  Current Medications: Current Facility-Administered Medications  Medication Dose Route Frequency Provider Last Rate Last Dose  . diphenhydrAMINE (BENADRYL) capsule 50 mg  50 mg Oral Q6H PRN Beverly SessionsSubedi, Jariana Shumard, MD  50 mg at 04/06/18 1340  . divalproex (DEPAKOTE) DR tablet 500 mg  500 mg Oral Q8H [name], [name], MD   500 mg at 04/07/18 1637  . feeding supplement (ENSURE ENLIVE) (ENSURE ENLIVE) liquid 237 mL  237 mL Oral TID BM [name], [name], MD   237 mL at 04/07/18 1642  . haloperidol (HALDOL) tablet 10 mg  10 mg Oral Q6H PRN [name], [name], MD   10 mg at 04/06/18 1000  . haloperidol (HALDOL) tablet 10 mg  10 mg Oral Q breakfast [name], [name], MD   10 mg at 04/07/18 0800  . haloperidol decanoate (HALDOL DECANOATE) 100 MG/ML injection 250 mg  250 mg Intramuscular Q28 days [name], [name], MD   250 mg at 04/02/18 1430  . LORazepam (ATIVAN) tablet 2 mg  2 mg Oral Q6H PRN [name], [name], MD   2 mg at 04/07/18 1637  . mirtazapine (REMERON) tablet  15 mg  15 mg Oral QHS Clapacs, [name], MD   15 mg at 04/06/18 2044  . OLANZapine (ZYPREXA) tablet 20 mg  20 mg Oral QHS Clapacs, [name], MD   20 mg at 04/06/18 2044  . SUMAtriptan (IMITREX) tablet 50 mg  50 mg Oral Q2H PRN [name], [name], MD   50 mg at 04/07/18 1638  . traZODone (DESYREL) tablet 100 mg  100 mg Oral QHS [name], [name], MD   100 mg at 04/06/18 2044  . ziprasidone (GEODON) injection 20 mg  20 mg Intramuscular Q4H PRN [name], [name], MD   20 mg at 04/06/18 6962    Lab Results:  Results for orders placed or performed during the hospital encounter of 04/01/18 (from the past 48 hour(s))  Valproic acid level     Status: None   Collection Time: 04/07/18  7:17 AM  Result Value Ref Range   Valproic Acid Lvl 98 50.0 - 100.0 ug/mL    Comment: Performed at [facility], [address] [place], [place] [address]    Blood Alcohol level:  Lab Results  Component Value Date   [facility] <10 04/01/2018   ETH <10 02/04/2018    Metabolic Disorder Labs: Lab Results  Component Value Date   HGBA1C 4.6 (L) 04/02/2018   MPG 85.32 04/02/2018   MPG 91 04/11/2017   Lab Results  Component Value Date   PROLACTIN 26.4 (H) 04/11/2017   PROLACTIN 40.2 (H) 01/27/2016   Lab Results  Component Value Date   CHOL 202 (H) 04/02/2018   TRIG 102 04/02/2018   HDL 46 04/02/2018   CHOLHDL 4.4 04/02/2018   VLDL 20 04/02/2018   LDLCALC 136 (H) 04/02/2018   LDLCALC 167 (H) 04/11/2017    Physical Findings: AIMS: Facial and Oral Movements Muscles of Facial Expression: None, normal Lips and Perioral Wilson: None, normal Jaw: None, normal Tongue: None, normal,Extremity Movements Upper (arms, wrists, hands, fingers): None, normal Lower (legs, knees, ankles, toes): None, normal, Trunk Movements Neck, shoulders, hips: None, normal, Overall Severity Severity of abnormal movements (highest score from questions above): None, normal Incapacitation due to abnormal movements: None,  normal Patient's awareness of abnormal movements (rate only patient's report): No Awareness, Dental Status Current problems with teeth and/or dentures?: No Does patient usually wear dentures?: No  CIWA:    COWS:     Musculoskeletal: Strength & Muscle Tone: within normal limits Gait & Station: normal Patient leans:   Psychiatric Specialty Exam: Physical Exam  Nursing note and vitals reviewed.  ROS  Blood pressure 101/89, pulse 96, temperature 97.6 F (36.4 C), temperature source Oral, resp. rate 18, height 6' (1.829 m), weight 74.4 kg (164 lb), SpO2 100 %.Body mass index is 22.24 kg/m.  General Appearance: disheveled, age appropriate  Eye Contact: poor  Speech:  disorganized  Volume:  normal  Mood:  dont feel good"  Affect:  irritable, labile  Thought Process:  Disorganized, Irrelevant and Descriptions of Associations: tangential  Orientation:  Full (Time, Place, and Person)  Thought Content:   Paranoid Ideation that his clothes are stolen, AH  Suicidal Thoughts:  No  Homicidal Thoughts:  denies  Memory:  UTA  Judgement:  Poor  Insight:  Lacking  Psychomotor Activity:  normal  Concentration:  Concentration: Poor and Attention Span: Poor  Recall:  did not coopertae  Fund of Knowledge:  UTA  Language: fair  Akathisia:  No  Handed:    AIMS (if indicated):     Assets:  Physical Health Social Support  ADL's:  Intact  Cognition:  fair  Sleep:  Number of Hours: 8       Treatment Plan Summary: Daily contact with patient to assess and evaluate symptoms and progress in treatment and Medication management Pt is psychotic, disorganized. QTc-441.  Cont zyprexa, haldol, cont depakote,  depakote level- 98.   D/c 1 : 1 .  Beverly Sessions, MD 04/07/2018, 4:50 PMPatient ID: Peter Wilson, male   DOB: [birth year], 42 y.o.   MRN: [id number]

## 2018-04-07 NOTE — Progress Notes (Signed)
Patient remains in bed asleep. Safety maintained on 1:1 level of observation.

## 2018-04-07 NOTE — Progress Notes (Signed)
[facility] [phone] regarding pt's referral. CSW spoke with Leonette Mostharles who reported pt has been placed on the waiting list. Leonette MostCharles explained they are not able to provide information regarding where pt is on the waiting list, nor how long it will take for him to be admitted. CSW will follow up daily with [facility] regarding pt's referral.   Heidi DachKelsey Diahn Waidelich, MSW, [name] Clinical Social Worker 04/07/2018 9:00 AM

## 2018-04-07 NOTE — Plan of Care (Signed)
  Problem: Spiritual Needs Goal: Ability to function at adequate level Outcome: Progressing Note:  Up to dayroom for meals.  Poor hygiene   Problem: Education: Goal: Emotional status will improve Outcome: Progressing Note:  Denies SI/HI/AVH.  Affect sad. Goal: Mental status will improve Outcome: Progressing Note:  Denies SI/HI/AVH.  Affect sad.   Problem: Activity: Goal: Interest or engagement in activities will improve Outcome: Progressing Note:  Up to dayroom with peers although minimal interaction noted with peer.    Problem: Coping: Goal: Ability to verbalize frustrations and anger appropriately will improve Outcome: Progressing Note:  No outbursts at this time Goal: Ability to demonstrate self-control will improve Outcome: Progressing Note:  Pleasant and calm at this time   Problem: Safety: Goal: Periods of time without injury will increase Outcome: Progressing Note:  Remains safe on the unit   Problem: Coping: Goal: Ability to interact with others will improve Outcome: Progressing Goal: Ability to use eye contact when communicating with others will improve Outcome: Progressing Note:  Eye contact fair   Problem: Safety: Goal: Ability to remain free from injury will improve Outcome: Progressing Note:  Remains safe on the unit

## 2018-04-07 NOTE — Progress Notes (Signed)
Patient remains asleep. Safety maintained on 1:1  level of observation.

## 2018-04-07 NOTE — Progress Notes (Signed)
Pt remains in bed asleep. Safety maintained on 1:1.

## 2018-04-07 NOTE — Plan of Care (Signed)
1:1 discontinued. Pt cooperative

## 2018-04-07 NOTE — Progress Notes (Signed)
Hourly Rounding 0800 Medication room receiving medications.  Sitter with patient 0900 Resting in bed with eyes closed.  1:1 maintained 1000 Doctor in talking with patient.  1:1 maintained 1100 Up to dayroom watching TV.  1:1 maintainded 1200 Resting in bed. 1:1 maintained.   1300 In bed resting quietly with eyes closed.  1:1 maintained.   1400 In bed resting quietly with eyes closed.  1:1 maintained.  1500 In bed resting quietly with eyes closed.  1:1 maintained.  1600 In bed resting quietly with eyes closed.  1:1 maintained. 1700 1:1 discontinued.  1800 1900
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
## 2018-04-08 NOTE — [facility] Group Notes (Signed)
[facility] Group Notes:  (Nursing/MHT/Case Management/Adjunct)  Date:  04/08/2018  Time:  3:15 PM  Type of Therapy:  Psychoeducational Skills  Participation Level:  Active  Participation Quality:  Appropriate  Affect:  Appropriate  Cognitive:  Appropriate  Insight:  Appropriate  Engagement in Group:  Engaged  Modes of Intervention:  Socialization  Summary of Progress/Problems:  Peter Wilson 04/08/2018, 3:15 PM

## 2018-04-08 NOTE — Progress Notes (Signed)
Recreation Therapy Notes          Peter Wilson 04/08/2018 3:23 PM

## 2018-04-08 NOTE — Progress Notes (Signed)
Patient states to this writer that he is very confused and believes that the doctor is not listening to him. Patient also states "I want to go down to the magistrates office and press charges on her for harboring a fugitive, because I am a fugitive". Patient also states "I'm just sitting here in my own world, that doctor ain't never gave a fuck about me since I came here the first time. 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 Patient stated to this writer in front of his doctor, "I'm not going to eat or take any of that medicine, it's all going into the trash". This Clinical research associatewriter states to this Clinical research associatewriter "you're on her side, both of y'all motherfuckers are trying to kill me, I don't want to talk to you anymore either". Patient walked off from this writer and went to his room and slammed the door.
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
## 2018-04-08 NOTE — Progress Notes (Signed)
Patient ID: Peter Wilson, male   DOB: 1976-11-13, 42 y.o.   MRN: [id number] CS[facility]  Will continue to follow up on referral.  Peter SharkSara Nitin Mckowen, [name]

## 2018-04-08 NOTE — Progress Notes (Signed)
D- Patient alert and oriented. Patient presents in a pleasant mood on assessment stating tot his writer that he didn't sleep well last night because his "headache kept me up". Patient rates his pain level a "8/10" stating that he's had a headache for two months. Patient did request medication from this writer for his headache. Patient denies SI, HI, AVH, at this time. Patient denies any signs/symptoms of depression/anxiety stating "I keep that depression shit for the low". Patient's goal for today is to "get back in bed and go to sleep". Patient has been pacing the unit all morning.  A- Scheduled medications administered to patient, per MD orders. Support and encouragement provided.  Routine safety checks conducted every 15 minutes.  Patient informed to notify staff with problems or concerns.  R- No adverse drug reactions noted. Patient contracts for safety at this time. Patient compliant with medications and treatment plan. Patient receptive, calm, and cooperative. Patient interacts well with others on the unit.  Patient remains safe at this time.

## 2018-04-09 MED ORDER — HALOPERIDOL DECANOATE 100 MG/ML IM SOLN: 250.0000 mg | INTRAMUSCULAR | Status: DC

## 2018-04-09 NOTE — [facility] Group Notes (Signed)
  04/09/2018  Time: 0900  Type of Therapy and Topic: Group Therapy: Goals Group: SMART Goals   Participation Level:  Minimal   Description of Group:   The purpose of a daily goals group is to assist and guide patients in setting recovery/wellness-related goals. The objective is to set goals as they relate to the crisis in which they were admitted. Patients will be using SMART goal modalities to set measurable goals. Characteristics of realistic goals will be discussed and patients will be assisted in setting and processing how one will reach their goal. Facilitator will also assist patients in applying interventions and coping skills learned in psycho-education groups to the SMART goal and process how one will achieve defined goal.   Therapeutic Goals:  -Patients will develop and document one goal related to or their crisis in which brought them into treatment.  -Patients will be guided by [name] using SMART goal setting modality in how to set a measurable, attainable, realistic and time sensitive goal.  -Patients will process barriers in reaching goal.  -Patients will process interventions in how to overcome and successful in reaching goal.   Patient's Goal:  Pt continues to work towards their tx goals but has not yet reached them. Pt was able to participate in group discussion when prompted by CSW. Pt reported his goal for the day is to, "get out of here and see my grand kids."   Therapeutic Modalities:  Motivational Interviewing  Cognitive Behavioral Therapy  Crisis Intervention Model  SMART goals setting  Heidi DachKelsey Marquinn Meschke, MSW, [name] Clinical Social Worker 04/09/2018 10:04 AM

## 2018-04-09 NOTE — [facility] Group Notes (Addendum)
04/09/2018 1PM  Type of Therapy/Topic:  Group Therapy:  Feelings about Diagnosis  Participation Level:  None   Description of Group:   This group will allow patients to explore their thoughts and feelings about diagnoses they have received. Patients will be guided to explore their level of understanding and acceptance of these diagnoses. Facilitator will encourage patients to process their thoughts and feelings about the reactions of others to their diagnosis and will guide patients in identifying ways to discuss their diagnosis with significant others in their lives. This group will be process-oriented, with patients participating in exploration of their own experiences, giving and receiving support, and processing challenge from other group members.   Therapeutic Goals: 1. Patient will demonstrate understanding of diagnosis as evidenced by identifying two or more symptoms of the disorder 2. Patient will be able to express two feelings regarding the diagnosis 3. Patient will demonstrate their ability to communicate their needs through discussion and/or role play  Summary of Patient Progress: Patient was encouraged and invited to attend group. Patient did not attend group. Social worker will continue to encourage group participation in the future.        Therapeutic Modalities:   Cognitive Behavioral Therapy Brief Therapy Feelings Identification    Johny ShearsCassandra  Arnell Mausolf, [name] 04/09/2018 2:05 PM

## 2018-04-09 NOTE — Progress Notes (Signed)
Patient is alert and oriented x 4, denies pain or discomfort, no distress noted. Patient denies SI/HIAVH, his thoughts are organized, speech is non pressured, mood is pleasant and cooperative with  treatment plan. Patient is interacting appropriately with peers and staff. Patient attended evening group, interacted appropriately with peers and staff, complaint with evening medication will continue to monitor.
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
## 2018-04-09 NOTE — Plan of Care (Signed)
Patient has the ability to function at an adequate level and has verbalized understanding of the general information that has been presented to him and all questions and concerns have been addressed and answered. Patient denies SI/HI/AVH as well as any signs/symptoms of depression and anxiety at this time. Patient has been out in the milieu for meals and he attends unit groups when appropriate for him. Patient stated to this writer that he "tossed and turned" all night long. Patient has the ability to verbalize his frustrations into appropriate behaviors at times, although he does have outbursts throughout the day. Patient has been in compliance with his treatment plan although many times he has at multiple times said he was not going to take his medication. Patient has been observed interacting with a few members on the unit without any issues and has been participating in making decisions about his care. Patient has used fair eye contact when talking with this Clinical research associatewriter and he sometimes stares while communicating with this Clinical research associatewriter. Patient has been free from injury thus far and remains safe on the unit at this time.  Problem: Spiritual Needs Goal: Ability to function at adequate level Outcome: Progressing   Problem: Education: Goal: Knowledge of [place] [facility] Education information/materials will improve Outcome: Progressing Goal: Emotional status will improve Outcome: Progressing Goal: Mental status will improve Outcome: Progressing Goal: Verbalization of understanding the information provided will improve Outcome: Progressing   Problem: Activity: Goal: Interest or engagement in activities will improve Outcome: Progressing Goal: Sleeping patterns will improve Outcome: Progressing   Problem: Coping: Goal: Ability to verbalize frustrations and anger appropriately will improve Outcome: Progressing Goal: Ability to demonstrate self-control will improve Outcome: Progressing   Problem:  Health Behavior/Discharge Planning: Goal: Identification of resources available to assist in meeting health care needs will improve Outcome: Progressing Goal: Compliance with treatment plan for underlying cause of condition will improve Outcome: Progressing   Problem: Safety: Goal: Periods of time without injury will increase Outcome: Progressing   Problem: Activity: Goal: Will identify at least one activity in which they can participate Outcome: Progressing   Problem: Coping: Goal: Ability to identify and develop effective coping behavior will improve Outcome: Progressing Goal: Ability to interact with others will improve Outcome: Progressing Goal: Demonstration of participation in decision-making regarding own care will improve Outcome: Progressing Goal: Ability to use eye contact when communicating with others will improve Outcome: Progressing   Problem: Self-Concept: Goal: Will verbalize positive feelings about self Outcome: Progressing   Problem: Safety: Goal: Ability to remain free from injury will improve Outcome: Progressing

## 2018-04-09 NOTE — Progress Notes (Signed)
Patient ID: Williemae AreaQuincy Sproull, male   DOB: 02/15/1976, 42 y.o.   MRN: [id number] PER STATE REGULATIONS 482.30  THIS CHART WAS REVIEWED FOR MEDICAL NECESSITY WITH RESPECT TO THE PATIENT'S ADMISSION/ DURATION OF STAY.  NEXT REVIEW DATE: 04/13/2018 Willa RoughJENNIFER JONES Mikya Don, RN, BSN CASE MANAGER

## 2018-04-09 NOTE — Progress Notes (Signed)
Recreation Therapy Notes  Date: 04/09/2018  Time: 9:30 am   Location: Craft Room   Behavioral response: N/A   Intervention Topic: Coping Skills   Discussion/Intervention: Patient did not attend group.   Clinical Observations/Feedback:  Patient did not attend group.   Cici Rodriges LRT/CTRS        Bill Mcvey 04/09/2018 11:45 AM

## 2018-04-09 NOTE — Progress Notes (Signed)
D- Patient alert and oriented. Patient presents in a depressed, but mood on assessment stating that he "tossed and turned" last night. Patient denies SI, HI, AVH, and pain at this time. Patient also denies any signs/symptoms of depression/anxiety. Patient's goal for today is to "stay black and live". Patient also stated to this writer that he will not take anymore medication from me today.  A- Scheduled medications administered to patient, per MD orders. Support and encouragement provided.  Routine safety checks conducted every 15 minutes.  Patient informed to notify staff with problems or concerns.  R- No adverse drug reactions noted. Patient contracts for safety at this time. Patient compliant with medications and treatment plan. Patient receptive, calm, and cooperative. Patient interacts well with others on the unit.  Patient remains safe at this time.

## 2018-04-09 NOTE — Plan of Care (Signed)
  Problem: Spiritual Needs Goal: Ability to function at adequate level 04/09/2018 0205 by Trula OreAjetunmobi, Tiyon Sanor Abisola, RN Outcome: Progressing 04/09/2018 0204 by Trula OreAjetunmobi, Deante Blough Abisola, RN Outcome: Progressing   Problem: Education: Goal: Knowledge of [place] [facility] Education information/materials will improve 04/09/2018 0205 by Trula OreAjetunmobi, Carthel Castille Abisola, RN Outcome: Progressing 04/09/2018 0204 by Trula OreAjetunmobi, Areil Ottey Abisola, RN Outcome: Progressing Goal: Emotional status will improve 04/09/2018 0205 by Trula OreAjetunmobi, Mike Hamre Abisola, RN Outcome: Progressing 04/09/2018 0204 by Trula OreAjetunmobi, Darron Stuck Abisola, RN Outcome: Progressing Goal: Mental status will improve 04/09/2018 0205 by Trula OreAjetunmobi, Sandeep Delagarza Abisola, RN Outcome: Progressing 04/09/2018 0204 by Trula OreAjetunmobi, Karalee Hauter Abisola, RN Outcome: Progressing Goal: Verbalization of understanding the information provided will improve 04/09/2018 0205 by Trula OreAjetunmobi, Franci Oshana Abisola, RN Outcome: Progressing 04/09/2018 0204 by Trula OreAjetunmobi, Shaelynn Dragos Abisola, RN Outcome: Progressing   Problem: Education: Goal: Emotional status will improve 04/09/2018 0205 by Trula OreAjetunmobi, Ryder Chesmore Abisola, RN Outcome: Progressing 04/09/2018 0204 by Trula OreAjetunmobi, Wissam Resor Abisola, RN Outcome: Progressing
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
## 2018-04-09 NOTE — Progress Notes (Signed)
Patient states to this writer that "you the police" and he also stated that "I'm not taking anymore medication from y'all because all of y'all trying to kill me". Patient states "my sister already told me I could come back, y'all don't care about me".

## 2018-04-09 NOTE — Plan of Care (Signed)
  Problem: Spiritual Needs Goal: Ability to function at adequate level Outcome: Progressing   Problem: Education: Goal: Knowledge of [place] [facility] Education information/materials will improve Outcome: Progressing   Problem: Education: Goal: Mental status will improve Outcome: Progressing   Problem: Activity: Goal: Interest or engagement in activities will improve Outcome: Progressing

## 2018-04-09 NOTE — [facility] Group Notes (Signed)
[facility] Group Notes:  (Nursing/MHT/Case Management/Adjunct)  Date:  04/09/2018  Time:  11:09 PM  Type of Therapy:  Group Therapy  Participation Level:  Did Not Attend  Summary of Progress/Problems:  Mayra NeerJackie L Eternity Dexter 04/09/2018, 11:09 PM

## 2018-04-10 MED ORDER — SUMATRIPTAN SUCCINATE 50 MG PO TABS
50.0000 mg | ORAL_TABLET | ORAL | Status: DC | PRN
  Administered 2018-04-10 – 2018-04-17 (×5): 50 mg via ORAL
  Filled 2018-04-10 (×5): qty 1
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
## 2018-04-10 NOTE — Plan of Care (Signed)
Patient is very angry and irritable today. Patient denies SI and HI. Patient screaming at staff " I want you mother-fuckers to leave me alone. I'm ready to go." patient continues to pace back and forth in the hallways. " I feel like punching the glasses window." Patient does not attend groups. Patient mental status has not improved. Patient was given Ativan to help manage anxiety. Patient did not get up to eat breakfast, but was able to eat lunch. Patient continues to state, " I am getting angry." Nurse will continue to give emotional support. Nurse will continue to monitor. Problem: Spiritual Needs Goal: Ability to function at adequate level Outcome: Not Progressing

## 2018-04-10 NOTE — Plan of Care (Signed)
Patient slept for Estimated Hours of 7.15; Precautionary checks every 15 minutes for safety maintained, room free of safety hazards, patient sustains no injury or falls during this shift.  Problem: Spiritual Needs Goal: Ability to function at adequate level Outcome: Progressing   Problem: Education: Goal: Knowledge of [place] [facility] Education information/materials will improve Outcome: Progressing Goal: Emotional status will improve Outcome: Progressing Goal: Mental status will improve Outcome: Progressing Goal: Verbalization of understanding the information provided will improve Outcome: Progressing   Problem: Activity: Goal: Interest or engagement in activities will improve Outcome: Progressing Goal: Sleeping patterns will improve Outcome: Progressing   Problem: Coping: Goal: Ability to verbalize frustrations and anger appropriately will improve Outcome: Progressing Goal: Ability to demonstrate self-control will improve Outcome: Progressing   Problem: Health Behavior/Discharge Planning: Goal: Identification of resources available to assist in meeting health care needs will improve Outcome: Progressing Goal: Compliance with treatment plan for underlying cause of condition will improve Outcome: Progressing   Problem: Safety: Goal: Periods of time without injury will increase Outcome: Progressing   Problem: Activity: Goal: Will identify at least one activity in which they can participate Outcome: Progressing   Problem: Coping: Goal: Ability to identify and develop effective coping behavior will improve Outcome: Progressing Goal: Ability to interact with others will improve Outcome: Progressing Goal: Demonstration of participation in decision-making regarding own care will improve Outcome: Progressing Goal: Ability to use eye contact when communicating with others will improve Outcome: Progressing   Problem: Self-Concept: Goal: Will verbalize positive feelings  about self Outcome: Progressing   Problem: Safety: Goal: Ability to remain free from injury will improve Outcome: Progressing
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
## 2018-04-10 NOTE — Progress Notes (Signed)
   04/10/18 2015  Psych Admission Type (Psych Patients Only)  Admission Status Involuntary  Psychosocial Assessment  Patient Complaints Agitation;Anger;Anxiety;Decreased concentration;Confusion;Crying spells;Disorientation;Hyperactivity;Irritability;Nervousness;Panic attack;Restlessness;Shakiness;Tension  Eye Contact Glaring;Intense  Facial Expression Angry;Animated;Worried  Affect Angry;Anxious;Preoccupied;Sad  Speech Aggressive;Argumentative;Incoherent;Rapid;Pressured;Loud;Slurred;Tangential  Interaction Defensive;Guarded;Hostile;Hypervigilant  Motor Activity Pacing;Restless;Other (Comment) (appropriate)  Appearance/Hygiene Disheveled;Poor hygiene;Body odor  Behavior Characteristics Agressive verbally;Agitated;Anxious;Guarded;Hyperactive;Impulsive;Irritable;Pacing;Posturing;Restless;Wandering risk  Mood Anxious;Angry;Euphoric;Irritable;Preoccupied;Threatening  Thought Process  Coherency Blocking;Concrete thinking;Disorganized;Flight of ideas;Incoherent;Loose associations;Tangential  Content Preoccupation;Paranoia  Delusions None reported or observed  Perception Hallucinations  Hallucination Auditory  Judgment Impaired  Confusion Severe  Danger to Self  Current suicidal ideation? Denies  Danger to Others  Danger to Others None reported or observed

## 2018-04-10 NOTE — Progress Notes (Signed)
Peter Wilson visited with patient per patients requests. Patient stated that he felt as if he was not being heard and wanted to sit down with his care team. Peter Wilson spoke with patient's doctor and social worker. Ch attending the meeting per the patient's request. The patient expressed clearly his concerns about where he would like to go [facility] housing, and that he wanted a new payee for his benefits. Patient stated after the meeting that he felt heard. [place] provided a non anxious presence, emotional support, and attentive listening. Peter Wilson feels that he patient's desire to be heard was met as patient expressed he was glad that everyone came to the meeting.

## 2018-04-10 NOTE — Progress Notes (Signed)
Recreation Therapy Notes  Date: 04/10/2018  Time: 9:30 am   Location: Craft Room   Behavioral response: N/A   Intervention Topic: Communication    Discussion/Intervention: Patient did not attend group.   Clinical Observations/Feedback:  Patient did not attend group.   Exilda Wilhite LRT/CTRS        Oaklyn Jakubek 04/10/2018 10:27 AM 
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
## 2018-04-11 MED ORDER — ZIPRASIDONE MESYLATE 20 MG IM SOLR
20.0000 mg | INTRAMUSCULAR | Status: DC | PRN
  Administered 2018-04-11: 20 mg via INTRAMUSCULAR
  Filled 2018-04-11 (×2): qty 20

## 2018-04-11 MED ORDER — CLOZAPINE 25 MG PO TABS
50.0000 mg | ORAL_TABLET | Freq: Every day | ORAL | Status: DC
  Administered 2018-04-11: 50 mg via ORAL
  Filled 2018-04-11: qty 2

## 2018-04-11 MED ORDER — LITHIUM CARBONATE 300 MG PO CAPS
300.0000 mg | ORAL_CAPSULE | Freq: Three times a day (TID) | ORAL | Status: DC
Start: 2018-04-11 — End: 2018-04-17
  Administered 2018-04-11 – 2018-04-17 (×18): 300 mg via ORAL
  Filled 2018-04-11 (×18): qty 1

## 2018-04-11 NOTE — Progress Notes (Signed)
Recreation Therapy Notes  Date: 04/11/2018  Time: 9:30 am   Location: Craft Room   Behavioral response: N/A   Intervention Topic: Creative Expressions    Discussion/Intervention: Patient did not attend group.   Clinical Observations/Feedback:  Patient did not attend group.   Ivette Castronova LRT/CTRS        Mikhala Kenan 04/11/2018 12:22 PM 

## 2018-04-11 NOTE — [facility] Group Notes (Signed)
[facility] Group Notes:  (Nursing/MHT/Case Management/Adjunct)  Date:  04/11/2018  Time:  11:04 PM  Type of Therapy:  Group Therapy  Participation Level:  Did Not Attend  Summary of Progress/Problems:  Mayra NeerJackie L Aundre Hietala 04/11/2018, 11:04 PM

## 2018-04-11 NOTE — Plan of Care (Signed)
  Problem: Activity: Goal: Sleeping patterns will improve Outcome: Progressing   Problem: Coping: Goal: Ability to demonstrate self-control will improve Outcome: Progressing   Problem: Safety: Goal: Periods of time without injury will increase Outcome: Progressing   Problem: Safety: Goal: Ability to remain free from injury will improve Outcome: Progressing

## 2018-04-11 NOTE — [facility] Group Notes (Signed)
[name] Group Therapy Note 04/11/2018 9:00 AM  Type of Therapy and Topic:  Group Therapy:  Setting Goals  Participation Level:  Did Not Attend  Description of Group: In this process group, patients discussed using strengths to work toward goals and address challenges.  Patients identified two positive things about themselves and one goal they were working on.  Patients were given the opportunity to share openly and support each other's plan for self-empowerment.  The group discussed the value of gratitude and were encouraged to have a daily reflection of positive characteristics or circumstances.  Patients were encouraged to identify a plan to utilize their strengths to work on current challenges and goals.  Therapeutic Goals 1. Patient will verbalize personal strengths/positive qualities and relate how these can assist with achieving desired personal goals 2. Patients will verbalize affirmation of peers plans for personal change and goal setting 3. Patients will explore the value of gratitude and positive focus as related to successful achievement of goals 4. Patients will verbalize a plan for regular reinforcement of personal positive qualities and circumstances.  Summary of Patient Progress:  Peter Wilson was invited to today's group on setting goals, but chose not to attend.     Therapeutic Modalities Cognitive Behavioral Therapy Motivational Interviewing    Alease FrameSonya S Jeison Delpilar, [place]CS[place] 04/11/2018 3:24 PM
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
## 2018-04-11 NOTE — Progress Notes (Signed)
D- Patient alert and oriented. Patient presents in a pleasant mood on assessment, however, is complaining of not getting much rest stating that it was "the worst sleep I ever got" last night. Patient denies SI, HI, AVH, and pain at this time. Patient's goal for today is "free me".  A- Scheduled medications administered to patient, per MD orders. Support and encouragement provided.  Routine safety checks conducted every 15 minutes.  Patient informed to notify staff with problems or concerns.  R- No adverse drug reactions noted. Patient contracts for safety at this time. Patient compliant with medications and treatment plan. Patient receptive, calm, and cooperative. Patient interacts well with others on the unit.  Patient remains safe at this time.

## 2018-04-11 NOTE — Plan of Care (Signed)
Patient has the ability to function at an adequate level and has verbalized understanding of the general information that has been presented to him and all questions and concerns have been addressed and answered. Patient denies SI/HI/AVH as well as any signs/symptoms of depression and anxiety at this time. Patient has been out in the milieu for meals and he went outside to the courtyard when appropriate for him stating "I can't stay still the devil is trying to get me". Patient stated to this writer that he got the "worst" sleep he's ever had last night because of patient outbursts and other disruptions on the unit. Patient has the ability to verbalize his frustrations into appropriate behaviors at times, although he has had multiple outbursts throughout the day. Patient has been in compliance with his treatment plan/medication regimen. Patient has been observed interacting with a few members on the unit without any issues and has been participating in making decisions about his care. Patient has used fair eye contact and sometimes glares while communicating with this Clinical research associatewriter. Patient has been free from injury thus far and remains safe on the unit at this time.  Problem: Spiritual Needs Goal: Ability to function at adequate level Outcome: Progressing   Problem: Education: Goal: Knowledge of [facility] Education information/materials will improve Outcome: Progressing Goal: Emotional status will improve Outcome: Progressing Goal: Mental status will improve Outcome: Progressing Goal: Verbalization of understanding the information provided will improve Outcome: Progressing   Problem: Activity: Goal: Interest or engagement in activities will improve Outcome: Progressing Goal: Sleeping patterns will improve Outcome: Progressing   Problem: Coping: Goal: Ability to verbalize frustrations and anger appropriately will improve Outcome: Progressing Goal: Ability to demonstrate self-control will  improve Outcome: Progressing   Problem: Health Behavior/Discharge Planning: Goal: Identification of resources available to assist in meeting health care needs will improve Outcome: Progressing Goal: Compliance with treatment plan for underlying cause of condition will improve Outcome: Progressing   Problem: Safety: Goal: Periods of time without injury will increase Outcome: Progressing   Problem: Activity: Goal: Will identify at least one activity in which they can participate Outcome: Progressing   Problem: Coping: Goal: Ability to identify and develop effective coping behavior will improve Outcome: Progressing Goal: Ability to interact with others will improve Outcome: Progressing Goal: Demonstration of participation in decision-making regarding own care will improve Outcome: Progressing Goal: Ability to use eye contact when communicating with others will improve Outcome: Progressing   Problem: Self-Concept: Goal: Will verbalize positive feelings about self Outcome: Progressing   Problem: Safety: Goal: Ability to remain free from injury will improve Outcome: Progressing

## 2018-04-11 NOTE — Plan of Care (Signed)
Patient slept for Estimated Hours of 8.45; Precautionary checks every 15 minutes for safety maintained, room free of safety hazards, patient sustains no injury or falls during this shift.  Problem: Spiritual Needs Goal: Ability to function at adequate level Outcome: Progressing   Problem: Education: Goal: Knowledge of [facility] Education information/materials will improve Outcome: Progressing Goal: Emotional status will improve Outcome: Progressing Goal: Mental status will improve Outcome: Progressing Goal: Verbalization of understanding the information provided will improve Outcome: Progressing   Problem: Activity: Goal: Interest or engagement in activities will improve Outcome: Progressing Goal: Sleeping patterns will improve Outcome: Progressing   Problem: Coping: Goal: Ability to verbalize frustrations and anger appropriately will improve Outcome: Progressing Goal: Ability to demonstrate self-control will improve Outcome: Progressing   Problem: Health Behavior/Discharge Planning: Goal: Identification of resources available to assist in meeting health care needs will improve Outcome: Progressing Goal: Compliance with treatment plan for underlying cause of condition will improve Outcome: Progressing   Problem: Safety: Goal: Periods of time without injury will increase Outcome: Progressing   Problem: Activity: Goal: Will identify at least one activity in which they can participate Outcome: Progressing   Problem: Coping: Goal: Ability to identify and develop effective coping behavior will improve Outcome: Progressing Goal: Ability to interact with others will improve Outcome: Progressing Goal: Demonstration of participation in decision-making regarding own care will improve Outcome: Progressing Goal: Ability to use eye contact when communicating with others will improve Outcome: Progressing   Problem: Self-Concept: Goal: Will verbalize positive feelings  about self Outcome: Progressing   Problem: Safety: Goal: Ability to remain free from injury will improve Outcome: Progressing

## 2018-04-11 NOTE — [facility] Group Notes (Signed)
04/11/2018  Time: 1PM  Type of Therapy/Topic:  Group Therapy:  Balance in Life  Participation Level:  Did Not Attend  Description of Group:   This group will address the concept of balance and how it feels and looks when one is unbalanced. Patients will be encouraged to process areas in their lives that are out of balance and identify reasons for remaining unbalanced. Facilitators will guide patients in utilizing problem-solving interventions to address and correct the stressor making their life unbalanced. Understanding and applying boundaries will be explored and addressed for obtaining and maintaining a balanced life. Patients will be encouraged to explore ways to assertively make their unbalanced needs known to significant others in their lives, using other group members and facilitator for support and feedback.  Therapeutic Goals: 1. Patient will identify two or more emotions or situations they have that consume much of in their lives. 2. Patient will identify signs/triggers that life has become out of balance:  3. Patient will identify two ways to set boundaries in order to achieve balance in their lives:  4. Patient will demonstrate ability to communicate their needs through discussion and/or role plays  Summary of Patient Progress: Pt was invited to attend group but chose not to attend. CSW will continue to encourage pt to attend group throughout their admission.   Therapeutic Modalities:   Cognitive Behavioral Therapy Solution-Focused Therapy Assertiveness Training   Heidi DachKelsey Auri Jahnke, MSW, [name] Clinical Social Worker 04/11/2018 2:05 PM
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
## 2018-04-11 NOTE — Progress Notes (Signed)
D: Pt has remained sleep majority of shift A: Pt was offered support and encouragement.   Q 15 minute checks were done for safety.  R: safety maintained on unit.

## 2018-04-12 LAB — CBC WITH DIFFERENTIAL/PLATELET
BASOS PCT: 1 %
Basophils Absolute: 0 10*3/uL (ref 0–0.1)
EOS ABS: 0.5 10*3/uL (ref 0–0.7)
Eosinophils Relative: 9 %
HCT: 35.8 % — ABNORMAL LOW (ref 40.0–52.0)
HEMOGLOBIN: 11.9 g/dL — AB (ref 13.0–18.0)
Lymphocytes Relative: 35 %
Lymphs Abs: 2 10*3/uL (ref 1.0–3.6)
MCH: 29.6 pg (ref 26.0–34.0)
MCHC: 33.4 g/dL (ref 32.0–36.0)
MCV: 88.7 fL (ref 80.0–100.0)
MONOS PCT: 11 %
Monocytes Absolute: 0.7 10*3/uL (ref 0.2–1.0)
Neutro Abs: 2.6 10*3/uL (ref 1.4–6.5)
Neutrophils Relative %: 44 %
Platelets: 319 10*3/uL (ref 150–440)
RBC: 4.03 MIL/uL — ABNORMAL LOW (ref 4.40–5.90)
RDW: 14.3 % (ref 11.5–14.5)
WBC: 5.9 10*3/uL (ref 3.8–10.6)

## 2018-04-12 MED ORDER — CLOZAPINE 100 MG PO TABS
100.0000 mg | ORAL_TABLET | Freq: Every day | ORAL | Status: DC
  Administered 2018-04-13 – 2018-04-14 (×2): 100 mg via ORAL
  Filled 2018-04-12 (×2): qty 1

## 2018-04-12 MED ORDER — CLOZAPINE 25 MG PO TABS
50.0000 mg | ORAL_TABLET | Freq: Every day | ORAL | Status: AC
  Administered 2018-04-12: 50 mg via ORAL
  Filled 2018-04-12: qty 2
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
## 2018-04-12 NOTE — NC FL2 (Signed)
[place] MEDICAID FL2 LEVEL OF CARE SCREENING TOOL     IDENTIFICATION  Patient Name: Peter Wilson Birthdate: 05-24-1976 Sex: male Admission Date (Current Location): 04/01/2018  [facility] and [place] Number:      Facility and Address:   [facility]  [address]  [place], [place]  [address]      Provider Number:  [id number]  Attending Physician Name and Address:  Shari Prows, MD  Relative Name and Phone Number:   Larina Bras  [phone]    Current Level of Care:  Hospital Recommended Level of Care:  Level II/Group Home Prior Approval Number:    Date Approved/Denied:   PASRR Number:  [id number]  Discharge Plan:  Level II/Group Home    Current Diagnoses: Patient Active Problem List   Diagnosis Date Noted  . Schizophrenia (HCC) 04/01/2018  . Noncompliance 04/10/2017  . Asthma 07/13/2016  . Schizoaffective disorder, bipolar type (HCC) 06/27/2016  . Tardive dyskinesia 09/15/2015  . Antisocial traits 09/15/2015  . Tobacco use disorder 09/14/2015  . Cannabis use disorder, moderate, dependence (HCC) 09/14/2015    Orientation RESPIRATION BLADDER Height & Weight    Self, Time, Situation, Place     Normal  Continent Weight: 164 lb (74.4 kg) Height:  6' (182.9 cm)  BEHAVIORAL SYMPTOMS/MOOD NEUROLOGICAL BOWEL NUTRITION STATUS       Continent  Regular Diet  AMBULATORY STATUS COMMUNICATION OF NEEDS Skin    Independent  Verbally  Normal                       Personal Care Assistance Level of Assistance   Bathing, Feeding, Dressing  All Independent         Functional Limitations Info   Sight, Hearing, Speech  All Adequate        SPECIAL CARE FACTORS FREQUENCY                       Contractures  None Present    Additional Factors Info                  Current Medications (04/12/2018):  This is the current hospital active medication list Current Facility-Administered Medications  Medication Dose  Route Frequency Provider Last Rate Last Dose  . cloZAPine (CLOZARIL) tablet 50 mg  50 mg Oral QHS [name], [name], MD   50 mg at 04/11/18 2327  . diphenhydrAMINE (BENADRYL) capsule 50 mg  50 mg Oral Q6H PRN Beverly Sessions, MD   50 mg at 04/11/18 1318  . divalproex (DEPAKOTE) DR tablet 500 mg  500 mg Oral Q8H [name], [name], MD   500 mg at 04/12/18 0839  . feeding supplement (ENSURE ENLIVE) (ENSURE ENLIVE) liquid 237 mL  237 mL Oral TID BM [name], [name], MD   237 mL at 04/11/18 2327  . haloperidol (HALDOL) tablet 10 mg  10 mg Oral Q6H PRN [name], [name], MD   10 mg at 04/11/18 1128  . haloperidol (HALDOL) tablet 10 mg  10 mg Oral Q breakfast [name], [name], MD   10 mg at 04/12/18 0839  . [START ON 04/23/2018] haloperidol decanoate (HALDOL DECANOATE) 100 MG/ML injection 250 mg  250 mg Intramuscular Q21 days [name], [name], MD      . lithium carbonate capsule 300 mg  300 mg Oral TID WC [name], [name], MD   300 mg at 04/12/18 0839  . LORazepam (ATIVAN) tablet 2 mg  2 mg Oral Q6H PRN [name], [name], MD   2 mg at 04/11/18 2325  . mirtazapine (REMERON) tablet 15 mg  15 mg Oral QHS Clapacs, Jackquline DenmarkJohn T, MD   15 mg at 04/11/18 2327  . phenylephrine (SUDAFED PE) tablet 10 mg  10 mg Oral Q4H PRN Beverly SessionsSubedi, Jagannath, MD   10 mg at 04/08/18 0909  . SUMAtriptan (IMITREX) tablet 50 mg  50 mg Oral Q2H PRN [name], [name], MD   50 mg at 04/10/18 1120  . traZODone (DESYREL) tablet 100 mg  100 mg Oral QHS [name], [name], MD   100 mg at 04/11/18 2325  . ziprasidone (GEODON) injection 20 mg  20 mg Intramuscular Q2H PRN [name], [name], MD   20 mg at 04/11/18 1453     Discharge Medications: Please see discharge summary for a list of discharge medications.  Relevant Imaging Results:  Relevant Lab Results:   Additional Information  SS# [id number]  Alease FrameSonya S Jarry Manon, [name]

## 2018-04-12 NOTE — Plan of Care (Signed)
Patient denies SI/HI at this time. Patient able to function in milieu. Patient had no complaints. Patient was receptive to information.   Problem: Spiritual Needs Goal: Ability to function at adequate level Outcome: Progressing   Problem: Education: Goal: Emotional status will improve Outcome: Progressing Goal: Mental status will improve Outcome: Progressing Goal: Verbalization of understanding the information provided will improve Outcome: Progressing

## 2018-04-12 NOTE — [facility] Suicide Risk Assessment (Signed)
[facility] INPATIENT:  Family/Significant Other Suicide Prevention Education  Suicide Prevention Education:  Family/Significant Other Refusal to Support Patient after Discharge:  Suicide Prevention Education Not Provided:  Patient initially identified the home of Larina Brasina Womack [phone]) as the place he will be residing following discharge.  With written consent of the patient, two attempts (initially on 04/05/18 and 04/12/18) were made to provide Suicide Prevention Education to Ms. Womack.  This person indicates that pt will not be allowed to come back to live with her following his discharge, but she will continue to support him financially as she is his designated payee.  Alease FrameSonya S Mahki Spikes 04/12/2018,11:49 AM

## 2018-04-12 NOTE — Progress Notes (Signed)
Patient ID: Peter Wilson, male   DOB: July 11, 1976, 42 y.o.   MRN: [id number] CSW contacted a representative at the [employer] [place]([employer]) to gather information about changing a payee.  Pt had informed CSW and his psychiatrist that he wanted to terminate his current payee [place] Wilson) and have another payee assigned. CSW spoke with Peter Wilson with the [employer].  CSW was informed that the payee could not be terminated by pt and that it was a process in place that must be completed.  If pt wanted a new payee then that person would have to come into a local [employer] office and formally apply to be pt's new payee. This person would need to bring in a photo identification. The SSA would review the application and make a decision whether or not to approve this person to become pt's new payee.  It the change is approved, the SSA will sent a notice of approval of new payee letter to current payee with a request of accounting of all expenses that have been paid for pt over a certain period to time.  CSW inquired if current payee was required to send any monies back currently that she had received for pt since he has been in the [facility].  [facility] stated not necessarily.  It would depend on if the payee has paid any expenses for pt (i.e. recurring bills, expenses) even with him being in the [facility].  The expenses would have to be listed on the accounting report that would be sent to [place] for [place].  CSW was also informed that if pt wanted to take control of his own money then his primary physician would have to write a letter that specifies that pt would be able to handle his own financial affairs.  CSW was able to get in touch with pt's current payee, Peter Wilson, after realizing that the telephone number in the chart is incorrect.  CSW inquired if [name] had spoken with pt since he has been in the [facility]. [name] informed CSW that she has been pt's payee since January or February 2019.  [name] shared that she has spoken with pt a total of three times since pt was admitted in the [facility].  CSW informed [name] that pt has informed his team that he wants to be placed in a [facility] following discharge.  [name] shared that she agrees with this decision and that pt needs to be in a place in which he can be monitored 24/7.  [name] informed CSW that pt has been in appropriately [id number] [facility] and that he has been "kicked out of all of them because of his behavior". [name] could not give CSW any names of previous [facility] [place] that pt has resided.   [name] informed CSW that pt came to live with her a little less than a year ago.  She accepted him in her house after he was [place] from a [facility] [place]  [name] informed CSW that pt has been "in and out" of several hospitals and many will not allow him to be admitted into them any more.  [name] shared with CSW that she will continue to support pt financially as she is his payee, but he cannot come back to live with her.
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
## 2018-04-12 NOTE — [facility] Group Notes (Signed)
4/19/20191PM  Type of Therapy and Topic:  Group Therapy:  Feelings around Relapse and Recovery  Participation Level:  Active   Description of Group:    Patients in this group will discuss emotions they experience before and after a relapse. They will process how experiencing these feelings, or avoidance of experiencing them, relates to having a relapse. Facilitator will guide patients to explore emotions they have related to recovery. Patients will be encouraged to process which emotions are more powerful. They will be guided to discuss the emotional reaction significant others in their lives may have to patients' relapse or recovery. Patients will be assisted in exploring ways to respond to the emotions of others without this contributing to a relapse.  Therapeutic Goals: 1. Patient will identify two or more emotions that lead to a relapse for them 2. Patient will identify two emotions that result when they relapse 3. Patient will identify two emotions related to recovery 4. Patient will demonstrate ability to communicate their needs through discussion and/or role plays   Summary of Patient Progress: Actively and appropriately engaged in the group. Patient stayed the entire time. Patient participated with the group. Patient was able to talk about other means of being able to cope with stress instead of substance abuse and relapsing. Patient in still in the process of obtaining treatment goals.      Therapeutic Modalities:   Cognitive Behavioral Therapy Solution-Focused Therapy Assertiveness Training Relapse Prevention Therapy   Peter ShearsCassandra  Lizabeth Fellner, [name] 04/12/2018 1:51 PM

## 2018-04-12 NOTE — Progress Notes (Signed)
Recreation Therapy Notes  Date: 04/12/2018  Time: 9:30 am  Location: Craft Room  Behavioral response: Appropriate, Redirection needed, restless  Intervention Topic: Leisure  Discussion/Intervention:  Group content today was focused on leisure. The group defined what leisure is and some positive leisure activities they participate in. Individuals identified the difference between good and bad leisure. Participants expressed how they feel after participating in the leisure of their choice. The group discussed how they go about picking a leisure activity and if others are involved in their leisure activities. The patient stated how many leisure activities they too choose from and reasons why it is important to have leisure time. Individuals participated in the intervention "Leisure Jeopardy" where they had a chance to identify new leisure activities as well as benefits of leisure. Clinical Observations/Feedback:  Patient came to group late due to unknown reasons. While in group he stated that he goes on dates during his leisure time. Individual was prompted to say on topic by group facilitator. He walked in and out of group several times.   Adaiah Jaskot LRT/CTRS         Hagar Sadiq 04/12/2018 11:25 AM
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
## 2018-04-12 NOTE — BHH Group Notes (Signed)
[facility] Group Notes:  (Nursing/MHT/Case Management/Adjunct)  Date:  04/12/2018  Time:  3:12 PM  Type of Therapy:  Psychoeducational Skills  Participation Level:  Did Not Attend  Peter Wilson Travis Ilian Wessell 04/12/2018, 3:12 PM 

## 2018-04-12 NOTE — Progress Notes (Signed)
D: Patient denies SI/HI/AVH. Patient verbally contracts for safety. Patient is agitated, but participates with direction. Patient is easily angered. Patient is seen in milieu interacting with peers. Patient has complaint of medications stating "I don't take these meds, I know what they are I just don't take them." Patient was compliant with medications this morning. Patient scored a 5 on DASA at this assessment.  A: Patient was assessed by this nurse. Patient was oriented to unit. Patient's safety was maintained on unit. Q x 15 minute observation checks were completed for safety. Patient care plan was reviewed. Patient was offered support and encouragement. Patient was encourage to attend groups, participate in unit activities and continue with plan of care.   R: Patient has no complaints of pain at this time. Patient is receptive to treatment and safety maintained on unit.

## 2018-04-12 NOTE — Progress Notes (Signed)
Clozapine monitoring Consult   42 yo male ordered clozapine on admission  04/19 ANC 2600  Information entered into Clozapine registry and pt eligible to receive clozapine Next labs due in a week - ordered for 04/26  Pharmacy will continue to follow.   Waldron LabsKaren K Serigne Kubicek, RPh 04/19/219
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
## 2018-04-13 MED ORDER — ACETAMINOPHEN 325 MG PO TABS
650.0000 mg | ORAL_TABLET | Freq: Four times a day (QID) | ORAL | Status: DC | PRN
  Administered 2018-04-14 – 2018-04-16 (×2): 650 mg via ORAL
  Filled 2018-04-13 (×2): qty 2
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
## 2018-04-13 NOTE — Plan of Care (Signed)
  Problem: Spiritual Needs Goal: Ability to function at adequate level Outcome: Progressing   Problem: Education: Goal: Knowledge of [facility] Education information/materials will improve Outcome: Progressing   Problem: Education: Goal: Emotional status will improve Outcome: Progressing

## 2018-04-13 NOTE — BHH Group Notes (Signed)
[name] Group Therapy Note   04/13/2018 1:15pm   Type of Therapy and Topic:  Group Therapy:  Trust and Honesty  Participation Level:  Did Not Attend  Description of Group:    In this group patients will be asked to explore the value of being honest.  Patients will be guided to discuss their thoughts, feelings, and behaviors related to honesty and trusting in others. Patients will process together how trust and honesty relate to forming relationships with peers, family members, and self. Each patient will be challenged to identify and express feelings of being vulnerable. Patients will discuss reasons why people are dishonest and identify alternative outcomes if one was truthful (to self or others). This group will be process-oriented, with patients participating in exploration of their own experiences, giving and receiving support, and processing challenge from other group members.   Therapeutic Goals: 1. Patient will identify why honesty is important to relationships and how honesty overall affects relationships.  2. Patient will identify a situation where they lied or were lied too and the  feelings, thought process, and behaviors surrounding the situation 3. Patient will identify the meaning of being vulnerable, how that feels, and how that correlates to being honest with self and others. 4. Patient will identify situations where they could have told the truth, but instead lied and explain reasons of dishonesty.   Summary of Patient Progress: Pt was invited to group but did not attend.      Therapeutic Modalities:   Cognitive Behavioral Therapy Solution Focused Therapy Motivational Interviewing Brief Therapy  Josslyn Ciolek  CUEBAS-COLON, [name] 04/13/2018 10:58 AM

## 2018-04-13 NOTE — Progress Notes (Signed)
[facility] MD Progress Note  04/13/2018 2:45 PM Peter Wilson  MRN:  [id number] Subjective: Follow-up note for 42 year old man with history of schizophrenia.  Patient remains active and very visible on the ward.  He paces around a lot.  Willingly engages in conversation with me.  He is not today showing any aggravation or violence although I do note that he made a statement to 1 of the nurses earlier that implies he still has violent thoughts.  Patient to me wanted to emphasize how well he thought he was doing and that he thinks he is already to go home with his mother.  Patient did not make any physical complaints to me although nursing reports to me that he still frequently complains about headaches. Principal Problem: Schizoaffective disorder, bipolar type (HCC) Diagnosis:   Patient Active Problem List   Diagnosis Date Noted  . Schizophrenia (HCC) [F20.9] 04/01/2018  . Noncompliance [Z91.19] 04/10/2017  . Asthma [J45.909] 07/13/2016  . Schizoaffective disorder, bipolar type (HCC) [F25.0] 06/27/2016  . Tardive dyskinesia [G24.01] 09/15/2015  . Antisocial traits [F60.2] 09/15/2015  . Tobacco use disorder [F17.200] 09/14/2015  . Cannabis use disorder, moderate, dependence (HCC) [F12.20] 09/14/2015   Total Time spent with patient: 30 minutes  Past Psychiatric History: Long history of mental health problems with schizophrenia and poor medicine compliance  Past Medical History:  Past Medical History:  Diagnosis Date  . Marijuana abuse   . Schizophrenia, paranoid (HCC)   . Tardive dyskinesia   . Tobacco abuse     Past Surgical History:  Procedure Laterality Date  . NO PAST SURGERIES     Family History: History reviewed. No pertinent family history. Family Psychiatric  History: History of chronic mental health problem Social History:  Social History   Substance and Sexual Activity  Alcohol Use Yes  . Alcohol/week: 0.0 oz     Social History   Substance and Sexual Activity  Drug Use  Yes  . Types: Marijuana    Social History   Socioeconomic History  . Marital status: Single    Spouse name: Not on file  . Number of children: Not on file  . Years of education: Not on file  . Highest education level: Not on file  Occupational History  . Not on file  Social Needs  . Financial resource strain: Not on file  . Food insecurity:    Worry: Not on file    Inability: Not on file  . Transportation needs:    Medical: Not on file    Non-medical: Not on file  Tobacco Use  . Smoking status: Current Every Day Smoker    Packs/day: 2.00    Types: Cigarettes  . Smokeless tobacco: Never Used  Substance and Sexual Activity  . Alcohol use: Yes    Alcohol/week: 0.0 oz  . Drug use: Yes    Types: Marijuana  . Sexual activity: Not on file  Lifestyle  . Physical activity:    Days per week: Not on file    Minutes per session: Not on file  . Stress: Not on file  Relationships  . Social connections:    Talks on phone: Not on file    Gets together: Not on file    Attends religious service: Not on file    Active member of club or organization: Not on file    Attends meetings of clubs or organizations: Not on file    Relationship status: Not on file  Other Topics Concern  . Not on file  Social History Narrative  . Not on file   Additional Social History:                         Sleep: Fair  Appetite:  Fair  Current Medications: Current Facility-Administered Medications  Medication Dose Route Frequency Provider Last Rate Last Dose  . cloZAPine (CLOZARIL) tablet 100 mg  100 mg Oral QHS [name], [name], MD      . diphenhydrAMINE (BENADRYL) capsule 50 mg  50 mg Oral Q6H PRN Beverly Sessions, MD   50 mg at 04/12/18 1411  . divalproex (DEPAKOTE) DR tablet 500 mg  500 mg Oral Q8H [name], [name], MD   500 mg at 04/13/18 1349  . feeding supplement (ENSURE ENLIVE) (ENSURE ENLIVE) liquid 237 mL  237 mL Oral TID BM [name], [name], MD   237 mL at  04/13/18 1015  . haloperidol (HALDOL) tablet 10 mg  10 mg Oral Q6H PRN [name], [name], MD   10 mg at 04/12/18 1620  . haloperidol (HALDOL) tablet 10 mg  10 mg Oral Q breakfast [name], [name], MD   10 mg at 04/13/18 0827  . [START ON 04/23/2018] haloperidol decanoate (HALDOL DECANOATE) 100 MG/ML injection 250 mg  250 mg Intramuscular Q21 days [name], [name], MD      . lithium carbonate capsule 300 mg  300 mg Oral TID WC [name], [name], MD   300 mg at 04/13/18 1043  . LORazepam (ATIVAN) tablet 2 mg  2 mg Oral Q6H PRN [name], [name], MD   2 mg at 04/12/18 2046  . mirtazapine (REMERON) tablet 15 mg  15 mg Oral QHS Clapacs, [name], MD   15 mg at 04/12/18 2033  . phenylephrine (SUDAFED PE) tablet 10 mg  10 mg Oral Q4H PRN Beverly Sessions, MD   10 mg at 04/08/18 0909  . SUMAtriptan (IMITREX) tablet 50 mg  50 mg Oral Q2H PRN [name], [name], MD   50 mg at 04/13/18 1043  . traZODone (DESYREL) tablet 100 mg  100 mg Oral QHS [name], [name], MD   100 mg at 04/12/18 2047  . ziprasidone (GEODON) injection 20 mg  20 mg Intramuscular Q2H PRN [name], [name], MD   20 mg at 04/11/18 1453    Lab Results:  Results for orders placed or performed during the hospital encounter of 04/01/18 (from the past 48 hour(s))  CBC with Differential/Platelet     Status: Abnormal   Collection Time: 04/12/18 10:51 AM  Result Value Ref Range   WBC 5.9 3.8 - 10.6 K/uL   RBC 4.03 (L) 4.40 - 5.90 MIL/uL   Hemoglobin 11.9 (L) 13.0 - 18.0 g/dL   HCT 16.1 (L) 09.6 - 04.5 %   MCV 88.7 80.0 - 100.0 fL   MCH 29.6 26.0 - 34.0 pg   MCHC 33.4 32.0 - 36.0 g/dL   RDW 40.9 81.1 - 91.4 %   Platelets 319 150 - 440 K/uL   Neutrophils Relative % 44 %   Neutro Abs 2.6 1.4 - 6.5 K/uL   Lymphocytes Relative 35 %   Lymphs Abs 2.0 1.0 - 3.6 K/uL   Monocytes Relative 11 %   Monocytes Absolute 0.7 0.2 - 1.0 K/uL   Eosinophils Relative 9 %   Eosinophils Absolute 0.5 0 - 0.7 K/uL   Basophils  Relative 1 %   Basophils Absolute 0.0 0 - 0.1 K/uL    Comment: Performed at  [facility], [address] [place], [place] [address]    Blood Alcohol level:  Lab Results  Component Value Date   [facility] PETH <10 04/01/2018   ETH <10 02/04/2018    Metabolic Disorder Labs: Lab Results  Component Value Date   HGBA1C 4.6 (L) 04/02/2018   MPG 85.32 04/02/2018   MPG 91 04/11/2017   Lab Results  Component Value Date   PROLACTIN 26.4 (H) 04/11/2017   PROLACTIN 40.2 (H) 01/27/2016   Lab Results  Component Value Date   CHOL 202 (H) 04/02/2018   TRIG 102 04/02/2018   HDL 46 04/02/2018   CHOLHDL 4.4 04/02/2018   VLDL 20 04/02/2018   LDLCALC 136 (H) 04/02/2018   LDLCALC 167 (H) 04/11/2017    Physical Findings: AIMS: Facial and Oral Movements Muscles of Facial Expression: None, normal Lips and Perioral Area: None, normal Jaw: None, normal Tongue: None, normal,Extremity Movements Upper (arms, wrists, hands, fingers): None, normal Lower (legs, knees, ankles, toes): None, normal, Trunk Movements Neck, shoulders, hips: None, normal, Overall Severity Severity of abnormal movements (highest score from questions above): None, normal Incapacitation due to abnormal movements: None, normal Patient's awareness of abnormal movements (rate only patient's report): No Awareness, Dental Status Current problems with teeth and/or dentures?: No Does patient usually wear dentures?: No  CIWA:    COWS:     Musculoskeletal: Strength & Muscle Tone: within normal limits Gait & Station: normal Patient leans: N/A  Psychiatric Specialty Exam: Physical Exam  Nursing note and vitals reviewed. Constitutional: He appears well-developed and well-nourished.  HENT:  Head: Normocephalic and atraumatic.  Eyes: Pupils are equal, round, and reactive to light. Conjunctivae are normal.  Neck: Normal range of motion.  Cardiovascular: Regular rhythm and normal heart sounds.  Respiratory: Effort normal. No  respiratory distress.  GI: Soft.  Musculoskeletal: Normal range of motion.  Neurological: He is alert.  Skin: Skin is warm and dry.  Psychiatric: His affect is labile. His speech is tangential. He is agitated. He is not aggressive. Thought content is not paranoid. Cognition and memory are impaired. He expresses impulsivity. He expresses no homicidal and no suicidal ideation.    Review of Systems  Constitutional: Negative.   HENT: Negative.   Eyes: Negative.   Respiratory: Negative.   Cardiovascular: Negative.   Gastrointestinal: Negative.   Musculoskeletal: Negative.   Skin: Negative.   Neurological: Negative.   Psychiatric/Behavioral: Negative for depression, hallucinations, memory loss, substance abuse and suicidal ideas. The patient is not nervous/anxious and does not have insomnia.     Blood pressure 103/78, pulse (!) 109, temperature 99.1 F (37.3 C), temperature source Oral, resp. rate 20, height 6' (1.829 m), weight 74.4 kg (164 lb), SpO2 98 %.Body mass index is 22.24 kg/m.  General Appearance: Casual  Eye Contact:  Good  Speech:  Clear and Coherent  Volume:  Decreased  Mood:  Euthymic  Affect:  Constricted  Thought Process:  Disorganized  Orientation:  Full (Time, Place, and Person)  Thought Content:  Logical, Rumination and Tangential  Suicidal Thoughts:  No  Homicidal Thoughts:  No  Memory:  Immediate;   Fair Recent;   Fair Remote;   Fair  Judgement:  Impaired  Insight:  Shallow  Psychomotor Activity:  Restlessness  Concentration:  Concentration: Fair  Recall:  [employer]  Fund of Knowledge:  Fair  Language:  Fair  Akathisia:  No  Handed:  Right  AIMS (if indicated):     Assets:  Desire for Improvement Housing Physical Health  ADL's:  Impaired  Cognition:  Impaired,  Mild  Sleep:  Number of Hours: 6.15     Treatment Plan Summary: Daily contact with patient to assess and evaluate symptoms and progress in treatment, Medication management and Plan Continue  medication management and redirection and engagement therapeutically when possible.  At the suggestion of nursing I will add an order for as needed Tylenol as well for headaches.  Mordecai Rasmussen, MD 04/13/2018, 2:45 PM

## 2018-04-13 NOTE — Progress Notes (Signed)
This Clinical research associatewriter spoke with "Drinda Buttsnnette" who was giving the pt a place to stay after he left her niece's group home in the past. Caller said she had visited [place] two [place] three days ago and said she was done with letting him stay with her, but is now reconsidering. Stated she picked him up from under a bridge and gave him a place to stay with amenities. Caller wanted to know how he was doing because she has considered taking him back if he "can stop messing with the girl that is taking his checks". Caller stated that the woman is just using [place] and [facility] straight [facility] because she didn't want to be bothered with him. Caller wanted to make sure he is progressing before she lets the pt come back to live with her. Caller asked about a possible D/C date. Support given to the caller. Pt safe on the unit. Will continue to monitor.

## 2018-04-13 NOTE — Progress Notes (Signed)
Patient is alert and oriented x 4, denies pain or discomfort, no distress noted. Patient denies SI/HIAVH, his thoughts are organized, speech is non pressured, mood is pleasant and cooperative with  treatment plan. Patient is interacting appropriately with peers and staff. Patient attended evening group, interacted appropriately with peers and staff, complaint with evening medication will continue to monitor.   

## 2018-04-13 NOTE — Progress Notes (Signed)
Patient ID: Peter Wilson, male   DOB: 1976-10-14, 42 y.o.   MRN: [id number] Per State regulations 482.30 this chart was reviewed for medical necessity with respect to the patient's admission/duration of stay.    Next review date: 04/17/18  Thurman CoyerEric Jeyda Wilson, BSN, RN-BC  Case Manager

## 2018-04-14 NOTE — Progress Notes (Signed)
Patient is cooperative, he denies pain or discomfort, or no distress noted. Patient denies SI/HIAVH, his thoughts are organized, speech is non pressured, mood is pleasant and cooperative with treatment plan. Patient is interacting appropriately with peers and staff. Patient voiced concerned about daughter having sickle cell and his concerns about her to Clinical research associatewriter. Patient attended evening group, interacted appropriately with peers and staff, complaint with evening medication will continue to monitor. He appears to be in bed resting quietly.

## 2018-04-14 NOTE — B[facility] Group Notes (Signed)
[name] Group Therapy Note 04/14/2018 1:15pm  Type of Therapy and Topic: Group Therapy: Feelings Around Returning Home & Establishing a Supportive Framework and Supporting Oneself When Supports Not Available  Participation Level: Did Not Attend  Description of Group:  Patients first processed thoughts and feelings about upcoming discharge. These included fears of upcoming changes, lack of change, new living environments, judgements and expectations from others and overall stigma of mental health issues. The group then discussed the definition of a supportive framework, what that looks and feels like, and how do to discern it from an unhealthy non-supportive network. The group identified different types of supports as well as what to do when your family/friends are less than helpful or unavailable  Therapeutic Goals  1. Patient will identify one healthy supportive network that they can use at discharge. 2. Patient will identify one factor of a supportive framework and how to tell it from an unhealthy network. 3. Patient able to identify one coping skill to use when they do not have positive supports from others. 4. Patient will demonstrate ability to communicate their needs through discussion and/or role plays.  Summary of Patient Progress:  Pt invited to group but did not attend.   Therapeutic Modalities Cognitive Behavioral Therapy Motivational Interviewing   Aleera Gilcrease  CUEBAS-COLON, [name] 04/14/2018 9:14 AM

## 2018-04-14 NOTE — Progress Notes (Signed)
[facility] MD Progress Note  04/14/2018 10:31 AM Peter Wilson  MRN:  [id number] Subjective: Follow-up patient with schizoaffective disorder.  Today he is a little more tired in the morning.  Laying in bed withdrawn but easily aroused.  Denies suicidal ideation.  He has not been aggressive hospital or making a big scene the last couple days seems to have calm down a little bit. Principal Problem: Schizoaffective disorder, bipolar type (HCC) Diagnosis:   Patient Active Problem List   Diagnosis Date Noted  . Schizophrenia (HCC) [F20.9] 04/01/2018  . Noncompliance [Z91.19] 04/10/2017  . Asthma [J45.909] 07/13/2016  . Schizoaffective disorder, bipolar type (HCC) [F25.0] 06/27/2016  . Tardive dyskinesia [G24.01] 09/15/2015  . Antisocial traits [F60.2] 09/15/2015  . Tobacco use disorder [F17.200] 09/14/2015  . Cannabis use disorder, moderate, dependence (HCC) [F12.20] 09/14/2015   Total Time spent with patient: 30 minutes  Past Psychiatric History: Long history of mental health issues and noncompliance and difficulty responding to medicine  Past Medical History:  Past Medical History:  Diagnosis Date  . Marijuana abuse   . Schizophrenia, paranoid (HCC)   . Tardive dyskinesia   . Tobacco abuse     Past Surgical History:  Procedure Laterality Date  . NO PAST SURGERIES     Family History: History reviewed. No pertinent family history. Family Psychiatric  History: None see previous notes Social History:  Social History   Substance and Sexual Activity  Alcohol Use Yes  . Alcohol/week: 0.0 oz     Social History   Substance and Sexual Activity  Drug Use Yes  . Types: Marijuana    Social History   Socioeconomic History  . Marital status: Single    Spouse name: Not on file  . Number of children: Not on file  . Years of education: Not on file  . Highest education level: Not on file  Occupational History  . Not on file  Social Needs  . Financial resource strain: Not on file  .  Food insecurity:    Worry: Not on file    Inability: Not on file  . Transportation needs:    Medical: Not on file    Non-medical: Not on file  Tobacco Use  . Smoking status: Current Every Day Smoker    Packs/day: 2.00    Types: Cigarettes  . Smokeless tobacco: Never Used  Substance and Sexual Activity  . Alcohol use: Yes    Alcohol/week: 0.0 oz  . Drug use: Yes    Types: Marijuana  . Sexual activity: Not on file  Lifestyle  . Physical activity:    Days per week: Not on file    Minutes per session: Not on file  . Stress: Not on file  Relationships  . Social connections:    Talks on phone: Not on file    Gets together: Not on file    Attends religious service: Not on file    Active member of club or organization: Not on file    Attends meetings of clubs or organizations: Not on file    Relationship status: Not on file  Other Topics Concern  . Not on file  Social History Narrative  . Not on file   Additional Social History:                         Sleep: Fair  Appetite:  Fair  Current Medications: Current Facility-Administered Medications  Medication Dose Route Frequency Provider Last Rate Last Dose  .  acetaminophen (TYLENOL) tablet 650 mg  650 mg Oral Q6H PRN Emberlynn Riggan, [name], MD   650 mg at 04/14/18 0801  . cloZAPine (CLOZARIL) tablet 100 mg  100 mg Oral QHS [name], [name], MD   100 mg at 04/13/18 2145  . diphenhydrAMINE (BENADRYL) capsule 50 mg  50 mg Oral Q6H PRN Beverly Sessions, MD   50 mg at 04/13/18 1501  . divalproex (DEPAKOTE) DR tablet 500 mg  500 mg Oral Q8H [name], [name], MD   500 mg at 04/14/18 0651  . feeding supplement (ENSURE ENLIVE) (ENSURE ENLIVE) liquid 237 mL  237 mL Oral TID BM [name], [name], MD   237 mL at 04/13/18 2149  . haloperidol (HALDOL) tablet 10 mg  10 mg Oral Q6H PRN [name], [name], MD   10 mg at 04/12/18 1620  . haloperidol (HALDOL) tablet 10 mg  10 mg Oral Q breakfast [name], [name], MD    10 mg at 04/14/18 0801  . [START ON 04/23/2018] haloperidol decanoate (HALDOL DECANOATE) 100 MG/ML injection 250 mg  250 mg Intramuscular Q21 days [name], [name], MD      . lithium carbonate capsule 300 mg  300 mg Oral TID WC [name], [name], MD   300 mg at 04/14/18 0801  . LORazepam (ATIVAN) tablet 2 mg  2 mg Oral Q6H PRN [name], [name], MD   2 mg at 04/12/18 2046  . mirtazapine (REMERON) tablet 15 mg  15 mg Oral QHS Roman Sandall, [name], MD   15 mg at 04/13/18 2145  . phenylephrine (SUDAFED PE) tablet 10 mg  10 mg Oral Q4H PRN Beverly Sessions, MD   10 mg at 04/13/18 1502  . SUMAtriptan (IMITREX) tablet 50 mg  50 mg Oral Q2H PRN [name], [name], MD   50 mg at 04/13/18 1043  . traZODone (DESYREL) tablet 100 mg  100 mg Oral QHS [name], [name], MD   100 mg at 04/13/18 2145  . ziprasidone (GEODON) injection 20 mg  20 mg Intramuscular Q2H PRN [name], [name], MD   20 mg at 04/11/18 1453    Lab Results:  Results for orders placed or performed during the hospital encounter of 04/01/18 (from the past 48 hour(s))  CBC with Differential/Platelet     Status: Abnormal   Collection Time: 04/12/18 10:51 AM  Result Value Ref Range   WBC 5.9 3.8 - 10.6 K/uL   RBC 4.03 (L) 4.40 - 5.90 MIL/uL   Hemoglobin 11.9 (L) 13.0 - 18.0 g/dL   HCT 16.1 (L) 09.6 - 04.5 %   MCV 88.7 80.0 - 100.0 fL   MCH 29.6 26.0 - 34.0 pg   MCHC 33.4 32.0 - 36.0 g/dL   RDW 40.9 81.1 - 91.4 %   Platelets 319 150 - 440 K/uL   Neutrophils Relative % 44 %   Neutro Abs 2.6 1.4 - 6.5 K/uL   Lymphocytes Relative 35 %   Lymphs Abs 2.0 1.0 - 3.6 K/uL   Monocytes Relative 11 %   Monocytes Absolute 0.7 0.2 - 1.0 K/uL   Eosinophils Relative 9 %   Eosinophils Absolute 0.5 0 - 0.7 K/uL   Basophils Relative 1 %   Basophils Absolute 0.0 0 - 0.1 K/uL    Comment: Performed at [facility], [address] [place], [place] [address]    Blood Alcohol level:  Lab Results  Component Value Date    ETH <10 04/01/2018   ETH <10 02/04/2018  Metabolic Disorder Labs: Lab Results  Component Value Date   HGBA1C 4.6 (L) 04/02/2018   MPG 85.32 04/02/2018   MPG 91 04/11/2017   Lab Results  Component Value Date   PROLACTIN 26.4 (H) 04/11/2017   PROLACTIN 40.2 (H) 01/27/2016   Lab Results  Component Value Date   CHOL 202 (H) 04/02/2018   TRIG 102 04/02/2018   HDL 46 04/02/2018   CHOLHDL 4.4 04/02/2018   VLDL 20 04/02/2018   LDLCALC 136 (H) 04/02/2018   LDLCALC 167 (H) 04/11/2017    Physical Findings: AIMS: Facial and Oral Movements Muscles of Facial Expression: None, normal Lips and Perioral Area: None, normal Jaw: None, normal Tongue: None, normal,Extremity Movements Upper (arms, wrists, hands, fingers): None, normal Lower (legs, knees, ankles, toes): None, normal, Trunk Movements Neck, shoulders, hips: None, normal, Overall Severity Severity of abnormal movements (highest score from questions above): None, normal Incapacitation due to abnormal movements: None, normal Patient's awareness of abnormal movements (rate only patient's report): No Awareness, Dental Status Current problems with teeth and/or dentures?: No Does patient usually wear dentures?: No  CIWA:    COWS:     Musculoskeletal: Strength & Muscle Tone: within normal limits Gait & Station: normal Patient leans: N/A  Psychiatric Specialty Exam: Physical Exam  Nursing note and vitals reviewed. Constitutional: He appears well-developed and well-nourished.  HENT:  Head: Normocephalic and atraumatic.  Eyes: Pupils are equal, round, and reactive to light. Conjunctivae are normal.  Neck: Normal range of motion.  Cardiovascular: Regular rhythm and normal heart sounds.  Respiratory: Effort normal. No respiratory distress.  GI: Soft.  Musculoskeletal: Normal range of motion.  Neurological: He is alert.  Skin: Skin is warm and dry.  Psychiatric: His affect is blunt. His speech is delayed. He is slowed.  Thought content is paranoid. He expresses impulsivity. He expresses no homicidal and no suicidal ideation. He exhibits abnormal recent memory.    Review of Systems  Constitutional: Negative.   HENT: Negative.   Eyes: Negative.   Respiratory: Negative.   Cardiovascular: Negative.   Gastrointestinal: Negative.   Musculoskeletal: Negative.   Skin: Negative.   Neurological: Negative.   Psychiatric/Behavioral: Negative for depression, hallucinations, memory loss, substance abuse and suicidal ideas. The patient is not nervous/anxious and does not have insomnia.     Blood pressure 112/88, pulse 96, temperature 97.8 F (36.6 C), temperature source Oral, resp. rate 20, height 6' (1.829 m), weight 74.4 kg (164 lb), SpO2 100 %.Body mass index is 22.24 kg/m.  General Appearance: Disheveled  Eye Contact:  Fair  Speech:  Slow  Volume:  Decreased  Mood:  Anxious  Affect:  Constricted  Thought Process:  Goal Directed  Orientation:  Full (Time, Place, and Person)  Thought Content:  Illogical  Suicidal Thoughts:  No  Homicidal Thoughts:  No  Memory:  Immediate;   Fair Recent;   Fair Remote;   Fair  Judgement:  Fair  Insight:  Fair  Psychomotor Activity:  Decreased  Concentration:  Concentration: Fair  Recall:  [employer] of Knowledge:  Fair  Language:  Fair  Akathisia:  No  Handed:  Right  AIMS (if indicated):     Assets:  Desire for Improvement  ADL's:  Impaired  Cognition:  Impaired,  Mild  Sleep:  Number of Hours: 8     Treatment Plan Summary: Daily contact with patient to assess and evaluate symptoms and progress in treatment, Medication management and Plan Patient gradually seems to be improving.  No longer  aggressive or agitated most of the time.  Seems to be tolerating medicine well.  No new physical complaints.  Supportive counseling no change to medicine  Mordecai RasmussenJohn Esme Durkin, MD 04/14/2018, 10:31 AM

## 2018-04-14 NOTE — Plan of Care (Signed)
Patient has the ability to function at an adequate level and has verbalized understanding of the general information that has been presented to him and all questions and concerns have been addressed and answered. Patient denies SI/HI/AVH as well as any signs/symptoms of depression and anxiety at this time. Patient has been out in the milieu for meals and he went outside to the courtyard when appropriate for him to get some fresh air. Patient stated that he didn't sleep well last night stating "hell naw". Patient has the ability to verbalize his frustrations into appropriate behaviors at times, and has been demonstrating self-control in that he has not had as many outbursts throughout the day. Patient has been in compliance with his treatment plan/medication regimen. Patient has been observed interacting with a few members on the unit without any issues and has been participating in making decisions about his care. Patient asked staff if he could go into the second dayroom so that he didn't have to be around a lot of people and he has been calm and cooperative. Patient uses fair eye contact when communicating with this Clinical research associatewriter. Patient has been free from injury thus far and remains safe on the unit at this time.  Problem: Spiritual Needs Goal: Ability to function at adequate level Outcome: Progressing   Problem: Education: Goal: Knowledge of [place] [facility] Education information/materials will improve Outcome: Progressing Goal: Emotional status will improve Outcome: Progressing Goal: Mental status will improve Outcome: Progressing Goal: Verbalization of understanding the information provided will improve Outcome: Progressing   Problem: Activity: Goal: Interest or engagement in activities will improve Outcome: Progressing Goal: Sleeping patterns will improve Outcome: Progressing   Problem: Coping: Goal: Ability to verbalize frustrations and anger appropriately will improve Outcome:  Progressing Goal: Ability to demonstrate self-control will improve Outcome: Progressing   Problem: Health Behavior/Discharge Planning: Goal: Identification of resources available to assist in meeting health care needs will improve Outcome: Progressing Goal: Compliance with treatment plan for underlying cause of condition will improve Outcome: Progressing   Problem: Safety: Goal: Periods of time without injury will increase Outcome: Progressing   Problem: Activity: Goal: Will identify at least one activity in which they can participate Outcome: Progressing   Problem: Coping: Goal: Ability to identify and develop effective coping behavior will improve Outcome: Progressing Goal: Ability to interact with others will improve Outcome: Progressing Goal: Demonstration of participation in decision-making regarding own care will improve Outcome: Progressing Goal: Ability to use eye contact when communicating with others will improve Outcome: Progressing   Problem: Self-Concept: Goal: Will verbalize positive feelings about self Outcome: Progressing   Problem: Safety: Goal: Ability to remain free from injury will improve Outcome: Progressing

## 2018-04-14 NOTE — Progress Notes (Signed)
D- Patient alert and oriented. Patient presents in a pleasant mood, although slightly irritated that he didn't sleep well last night stating to this writer "hell naw I didn't sleep well". Patient also stated to this writer that he isn't feeling well reported "muscle spasms on my right side" but he doesn't know why he hurting. Patient did request pain medication from this writer. Patient denies SI, HI, AVH, at this time. Patient also denies any signs/symptoms of depression/anxiety. Patient's goal for today is to "get my medicine and go back to sleep".  A- Scheduled medications administered to patient, per MD orders. Support and encouragement provided.  Routine safety checks conducted every 15 minutes.  Patient informed to notify staff with problems or concerns.  R- No adverse drug reactions noted. Patient contracts for safety at this time. Patient compliant with medications and treatment plan. Patient receptive, calm, and cooperative. Patient interacts well with others on the unit.  Patient remains safe at this time.

## 2018-04-15 MED ORDER — CLOZAPINE 25 MG PO TABS
150.0000 mg | ORAL_TABLET | Freq: Every day | ORAL | Status: DC
  Administered 2018-04-15 – 2018-04-16 (×2): 150 mg via ORAL
  Filled 2018-04-15 (×2): qty 1

## 2018-04-15 NOTE — Progress Notes (Signed)
Patient is alert and oriented x 4, denies pain or discomfort, no distress noted. Patient denies SI/HIAVH, his thoughts are organized, no loud outburst or bizarre behavior, mood is pleasant and cooperative with treatment plan. Patient is interacting appropriately with peers and staff. Patient attended evening group, interacted appropriately with peers and staff, complaint with evening medication will continue to monitor
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
## 2018-04-15 NOTE — Plan of Care (Addendum)
Patient found in common area upon my arrival. Patient is visible but not social this evening. Complains of headache 8/10, given Imitrex. Complains of agitation and demands HS medications early. Patient compliant with HS medications. Reports eating and voiding adequately. Patient is preoccupied with Ha. Difficulty concentrating on assessment questions. Q 15 minute checks maintained. Will continue to monitor throughout the shift. Patient is sleeping @2200 .  Patient slept 8.25 hours. No apparent distress. Patient is found sleeping on a mattress on the floor. Refused vitals. Will endorse care to oncoming shift.  Problem: Education: Goal: Knowledge of [place] [facility] Education information/materials will improve Outcome: Progressing Goal: Verbalization of understanding the information provided will improve Outcome: Progressing   Problem: Activity: Goal: Interest or engagement in activities will improve Outcome: Progressing Goal: Sleeping patterns will improve Outcome: Progressing

## 2018-04-15 NOTE — Plan of Care (Signed)
  Problem: Education: Goal: Emotional status will improve 04/15/2018 1506 by Curly RimBaker, Akeia Perot B, RN Outcome: Progressing 04/15/2018 1212 by Curly RimBaker, Makailyn Mccormick B, RN Outcome: Progressing   Problem: Coping: Goal: Ability to demonstrate self-control will improve 04/15/2018 1506 by Curly RimBaker, Kairy Folsom B, RN Outcome: Progressing 04/15/2018 1212 by Curly RimBaker, Rita Prom B, RN Outcome: Progressing   Problem: Safety: Goal: Periods of time without injury will increase 04/15/2018 1506 by Curly RimBaker, Parissa Chiao B, RN Outcome: Progressing 04/15/2018 1212 by Curly RimBaker, Naiara Lombardozzi B, RN Outcome: Progressing   Problem: Safety: Goal: Ability to remain free from injury will improve Outcome: Progressing

## 2018-04-15 NOTE — Progress Notes (Signed)
Recreation Therapy Notes  Date: 04/15/2018  Time: 9:30 am  Location: Craft Room  Behavioral response: Restless  Intervention Topic: Problem Solving  Discussion/Intervention:  Group content on today was focused on problem solving. The group described what problem solving is. Patients expressed how problems affect them and how they deal with problems. Individuals identified healthy ways to deal with problems. Patients explained what normally happens to them when they do not deal with problems. The group expressed reoccurring problems for them. The group participated in the intervention "Ways to Solve problems" where patients were given a chance to explore different ways to solve problems.  Clinical Observations/Feedback:  Patient came to group late due to unknown reasons. He walked in and out of group several times. Individual contributed nothing to group while in attendance.  Samuele Storey LRT/CTRS          Crysta Gulick 04/15/2018 12:02 PM

## 2018-04-15 NOTE — Plan of Care (Signed)
D: Pt denies SI/HI/AV hallucinations. Pt is pleasant and smiling. He is interacting with staff and in a pleasant mood. Pt goal for today is to get some sleep. A: Pt was offered support and encouragement. Pt was given scheduled medications. Pt was encourage to attend groups. Q 15 minute checks were done for safety.  R: Pt is taking medication. Pt has no complaints.Pt receptive to treatment and safety maintained on unit.

## 2018-04-15 NOTE — Progress Notes (Signed)
Patient ID: Peter Wilson AreaQuincy Kristiansen, male   DOB: December 30, 1975, 42 y.o.   MRN: [id number] CSW spoke with Pt's "grandmother" Ms. Paris LoreWiley who says that she is glad to have Pt home as long as she can talk to him about her expectations first.  CSW and [name] scheduled a meeting for Wednesday at 2 pm to review house rules and expectations with Shirlee MoreQuincy prior to discharge. She says that her preference for him is to be in a program of some kind during the day.  CSW will discuss Pt's preference and [facility] CST team's willingness to continue to follow Pt.  Jake SharkSara Pang Robers, [name]

## 2018-04-16 NOTE — [facility] Group Notes (Signed)
CSW Group Therapy Note  04/16/2018  Time:  0900  Type of Therapy and Topic: Group Therapy: Goals Group: SMART Goals    Participation Level:  Did Not Attend    Description of Group:   The purpose of a daily goals group is to assist and guide patients in setting recovery/wellness-related goals. The objective is to set goals as they relate to the crisis in which they were admitted. Patients will be using SMART goal modalities to set measurable goals. Characteristics of realistic goals will be discussed and patients will be assisted in setting and processing how one will reach their goal. Facilitator will also assist patients in applying interventions and coping skills learned in psycho-education groups to the SMART goal and process how one will achieve defined goal.    Therapeutic Goals:  -Patients will develop and document one goal related to or their crisis in which brought them into treatment.  -Patients will be guided by [name] using SMART goal setting modality in how to set a measurable, attainable, realistic and time sensitive goal.  -Patients will process barriers in reaching goal.  -Patients will process interventions in how to overcome and successful in reaching goal.    Patient's Goal:  Pt was invited to attend group but chose not to attend. CSW will continue to encourage pt to attend group throughout their admission.   Therapeutic Modalities:  Motivational Interviewing  Cognitive Behavioral Therapy  Crisis Intervention Model  SMART goals setting  Heidi DachKelsey Halil Rentz, MSW, [name] Clinical Social Worker 04/16/2018 9:51 AM
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
## 2018-04-16 NOTE — Progress Notes (Signed)
Patient ID: Peter Wilson, male   DOB: [birth year], 42 y.o.   MRN: [id number]   CS[facility] says that she will continue to serve Pt as lond as he is willing to work with her.  CSW provided her with the address and phone number where Pt will be living and informed her of family meeting to take place on Wednesday.  Also informed her that CSW would make a PSR referral to help have something to do during the day.  CSW agreed to inform her if anything changed in D/C plan.  Jake SharkSara Wanell Lorenzi, [name]

## 2018-04-16 NOTE — [facility] Group Notes (Signed)
[facility] Group Notes:  (Nursing/MHT/Case Management/Adjunct)  Date:  04/16/2018  Time:  9:02 PM  Type of Therapy:  Group Therapy  Participation Level:  Did Not Attend  Summary of Progress/Problems:  Peter Wilson 04/16/2018, 9:02 PM

## 2018-04-16 NOTE — Progress Notes (Signed)
Patient ID: Peter Wilson, male   DOB: [birth year], 42 y.o.   MRN: [id number] Referral sent for PSR to  Psychotherapeutic services.  Peter SharkSara Sorah Falkenstein, [name]

## 2018-04-16 NOTE — [facility] Group Notes (Signed)
04/16/2018 1PM  Type of Therapy/Topic:  Group Therapy:  Feelings about Diagnosis  Participation Level:  Did Not Attend   Description of Group:   This group will allow patients to explore their thoughts and feelings about diagnoses they have received. Patients will be guided to explore their level of understanding and acceptance of these diagnoses. Facilitator will encourage patients to process their thoughts and feelings about the reactions of others to their diagnosis and will guide patients in identifying ways to discuss their diagnosis with significant others in their lives. This group will be process-oriented, with patients participating in exploration of their own experiences, giving and receiving support, and processing challenge from other group members.   Therapeutic Goals: 1. Patient will demonstrate understanding of diagnosis as evidenced by identifying two or more symptoms of the disorder 2. Patient will be able to express two feelings regarding the diagnosis 3. Patient will demonstrate their ability to communicate their needs through discussion and/or role play  Summary of Patient Progress: Patient was encouraged and invited to attend group. Patient did not attend group. Social worker will continue to encourage group participation in the future.        Therapeutic Modalities:   Cognitive Behavioral Therapy Brief Therapy Feelings Identification    Johny ShearsCassandra  Tyronica Truxillo, [name] 04/16/2018 2:42 PM

## 2018-04-16 NOTE — Plan of Care (Signed)
Pleasant and cooperative at this.  Laughing and joking.  Verbalizes that "I am going to stay in my room so I don't get in any trouble because I am leaving tomorrow."  Medication compliant.  Support offered safety maintained.   Problem: Spiritual Needs Goal: Ability to function at adequate level Outcome: Progressing   Problem: Education: Goal: Knowledge of [place] [facility] Education information/materials will improve Outcome: Progressing Goal: Emotional status will improve Outcome: Progressing Goal: Mental status will improve Outcome: Progressing Goal: Verbalization of understanding the information provided will improve Outcome: Progressing   Problem: Activity: Goal: Interest or engagement in activities will improve Outcome: Progressing Goal: Sleeping patterns will improve Outcome: Progressing   Problem: Coping: Goal: Ability to verbalize frustrations and anger appropriately will improve Outcome: Progressing Goal: Ability to demonstrate self-control will improve Outcome: Progressing   Problem: Health Behavior/Discharge Planning: Goal: Identification of resources available to assist in meeting health care needs will improve Outcome: Progressing Goal: Compliance with treatment plan for underlying cause of condition will improve Outcome: Progressing   Problem: Safety: Goal: Periods of time without injury will increase Outcome: Progressing   Problem: Activity: Goal: Will identify at least one activity in which they can participate Outcome: Progressing   Problem: Coping: Goal: Ability to identify and develop effective coping behavior will improve Outcome: Progressing Goal: Ability to interact with others will improve Outcome: Progressing Goal: Demonstration of participation in decision-making regarding own care will improve Outcome: Progressing Goal: Ability to use eye contact when communicating with others will improve Outcome: Progressing   Problem:  Self-Concept: Goal: Will verbalize positive feelings about self Outcome: Progressing   Problem: Safety: Goal: Ability to remain free from injury will improve Outcome: Progressing

## 2018-04-17 LAB — CBC WITH DIFFERENTIAL/PLATELET
BASOS ABS: 0 10*3/uL (ref 0–0.1)
Basophils Relative: 1 %
EOS ABS: 0.4 10*3/uL (ref 0–0.7)
EOS PCT: 8 %
HCT: 37.6 % — ABNORMAL LOW (ref 40.0–52.0)
Hemoglobin: 12.1 g/dL — ABNORMAL LOW (ref 13.0–18.0)
Lymphocytes Relative: 32 %
Lymphs Abs: 1.7 10*3/uL (ref 1.0–3.6)
MCH: 29.2 pg (ref 26.0–34.0)
MCHC: 32.2 g/dL (ref 32.0–36.0)
MCV: 90.6 fL (ref 80.0–100.0)
Monocytes Absolute: 0.9 10*3/uL (ref 0.2–1.0)
Monocytes Relative: 16 %
Neutro Abs: 2.3 10*3/uL (ref 1.4–6.5)
Neutrophils Relative %: 43 %
PLATELETS: 315 10*3/uL (ref 150–440)
RBC: 4.15 MIL/uL — AB (ref 4.40–5.90)
RDW: 15.5 % — ABNORMAL HIGH (ref 11.5–14.5)
WBC: 5.4 10*3/uL (ref 3.8–10.6)

## 2018-04-17 LAB — LITHIUM LEVEL: LITHIUM LVL: 0.58 mmol/L — AB (ref 0.60–1.20)

## 2018-04-17 LAB — VALPROIC ACID LEVEL: Valproic Acid Lvl: 100 ug/mL (ref 50.0–100.0)

## 2018-04-17 MED ORDER — TRAZODONE HCL 100 MG PO TABS: 100.0000 mg | ORAL_TABLET | Freq: Every day | ORAL | 1 refills | Status: DC

## 2018-04-17 MED ORDER — SUMATRIPTAN SUCCINATE 50 MG PO TABS: 50.0000 mg | ORAL_TABLET | ORAL | 0 refills | Status: DC | PRN

## 2018-04-17 MED ORDER — CLOZAPINE 100 MG PO TABS: 200.0000 mg | ORAL_TABLET | Freq: Every day | ORAL | Status: DC

## 2018-04-17 MED ORDER — LITHIUM CARBONATE 300 MG PO CAPS: 300.0000 mg | ORAL_CAPSULE | Freq: Three times a day (TID) | ORAL | 1 refills | Status: DC

## 2018-04-17 MED ORDER — HALOPERIDOL DECANOATE 100 MG/ML IM SOLN: 250.0000 mg | INTRAMUSCULAR | 1 refills | Status: DC

## 2018-04-17 MED ORDER — METFORMIN HCL 500 MG PO TABS: 500.0000 mg | ORAL_TABLET | Freq: Every day | ORAL | 1 refills | Status: DC

## 2018-04-17 MED ORDER — DIVALPROEX SODIUM 500 MG PO DR TAB: 500.0000 mg | DELAYED_RELEASE_TABLET | Freq: Three times a day (TID) | ORAL | 1 refills | Status: DC

## 2018-04-17 MED ORDER — CLOZAPINE 200 MG PO TABS: 200.0000 mg | ORAL_TABLET | Freq: Every day | ORAL | 1 refills | Status: DC

## 2018-04-17 MED ORDER — METFORMIN HCL 500 MG PO TABS: 500.0000 mg | ORAL_TABLET | Freq: Every day | ORAL | Status: DC

## 2018-04-17 MED ORDER — MIRTAZAPINE 15 MG PO TABS: 15.0000 mg | ORAL_TABLET | Freq: Every day | ORAL | 1 refills | Status: DC
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
## 2018-04-17 NOTE — [facility] Suicide Risk Assessment (Signed)
[facility]   Principal Problem: Schizoaffective disorder, bipolar type [facility](HCC) Discharge Diagnoses:  Patient Active Problem List   Diagnosis Date Noted  . Schizoaffective disorder, bipolar type (HCC) [F25.0] 06/27/2016    Priority: High  . Schizophrenia (HCC) [F20.9] 04/01/2018  . Noncompliance [Z91.19] 04/10/2017  . Asthma [J45.909] 07/13/2016  . Tardive dyskinesia [G24.01] 09/15/2015  . Antisocial traits [F60.2] 09/15/2015  . Tobacco use disorder [F17.200] 09/14/2015  . Cannabis use disorder, moderate, dependence (HCC) [F12.20] 09/14/2015    Total Time spent with patient: 20 minutes plus 20 min on care coordination and documantation  Musculoskeletal: Strength & Muscle Tone: within normal limits Gait & Station: normal Patient leans: N/A  Psychiatric Specialty Exam: Review of Systems  Neurological: Positive for headaches.  Psychiatric/Behavioral: Negative.   All other systems reviewed and are negative.   Blood pressure 122/85, pulse 96, temperature (!) 97.5 F (36.4 C), temperature source Oral, resp. rate 18, height 6' (1.829 m), weight 74.4 kg (164 lb), SpO2 100 %.Body mass index is 22.24 kg/m.  General Appearance: Casual  Eye Contact::  Good  Speech:  Clear and Coherent409  Volume:  Normal  Mood:  Euthymic  Affect:  Appropriate  Thought Process:  Goal Directed and Descriptions of Associations: Intact  Orientation:  Full (Time, Place, and Person)  Thought Content:  WDL  Suicidal Thoughts:  No  Homicidal Thoughts:  No  Memory:  Immediate;   Fair Recent;   Fair Remote;   Fair  Judgement:  Impaired  Insight:  Shallow  Psychomotor Activity:  Normal  Concentration:  Fair  Recall:  [employer]  Fund of Knowledge:Fair  Language: Fair  Akathisia:  No  Handed:  Right  AIMS (if indicated):     Assets:  Communication Skills Desire for Improvement Financial Resources/Insurance Housing Physical Health Resilience Social Support  Sleep:  Number of  Hours: 6.75  Cognition: WNL  ADL's:  Intact   Mental Status Per Nursing Assessment::   On Admission:     Demographic Factors:  Male  Loss Factors: NA  Historical Factors: Family history of mental illness or substance abuse and Impulsivity  Risk Reduction Factors:   Sense of responsibility to family, Living with another person, especially a relative and Positive social support  Continued Clinical Symptoms:  Bipolar Disorder:   Mixed State  Cognitive Features That Contribute To Risk:  None    Suicide Risk:  Minimal: No identifiable suicidal ideation.  Patients presenting with no risk factors but with morbid ruminations; may be classified as minimal risk based on the severity of the depressive symptoms  Follow-up Information    [facility] Go on 04/19/2018.   Why:  12:30pm for Hospital Follow up.  At this appointment they will get you on Dr. Clent DemarkMoffit's schedule for medication management. Contact information: [address] [place] [address] [phone]           Plan Of Care/Follow-up recommendations:  Activity:  as tolerated Diet:  low sodium heart healthy Other:  keep follow up appointments  Kristine LineaJolanta Jahmeir Geisen, MD 04/17/2018, 7:00 AM

## 2018-04-17 NOTE — Plan of Care (Addendum)
Patient found in day room upon my arrival. Patient is visible and social this evening. Complains of Ha, given Tylenol with positive results. Denies SI/HI/AVH. Denies depression and anxiety. Patient is happy and excited to be returning home tomorrow. Feels ready for discharge. Patient is talkative and engaging regarding what his grandmother will cook for him upon his return. Requests and is given HS medications early. Reports eating and voiding adequately. Patient placed his mattress on the floor again tonight. Patient is unable to state why he is doing this. "I don't know why. I guess I like it." Compliant with HS medications and staff direction. Q 15 minute checks maintained. Will continue to monitor throughout the shift. Patient slept 6.75 hours. No apparent distress. Will endorse care to oncoming shift.  Problem: Spiritual Needs Goal: Ability to function at adequate level Outcome: Progressing   Problem: Education: Goal: Knowledge of [facility] Education information/materials will improve Outcome: Progressing Goal: Emotional status will improve Outcome: Progressing Goal: Mental status will improve Outcome: Progressing Goal: Verbalization of understanding the information provided will improve Outcome: Progressing   Problem: Activity: Goal: Interest or engagement in activities will improve Outcome: Progressing Goal: Sleeping patterns will improve Outcome: Progressing   Problem: Coping: Goal: Ability to demonstrate self-control will improve Outcome: Progressing   Problem: Coping: Goal: Ability to interact with others will improve Outcome: Progressing Goal: Ability to use eye contact when communicating with others will improve Outcome: Progressing

## 2018-04-17 NOTE — Progress Notes (Signed)
Recreation Therapy Notes  Date: 04/17/2018  Time: 9:30 am  Location: Craft Room  Behavioral response: Appropriate  Intervention Topic: Relaxation  Discussion/Intervention:  Group content today was focused on relaxation. The group defined relaxation and identified healthy ways to relax. Individuals expressed how much time they spend relaxing. Patients expressed how much their life would be if they did not make time for themselves to relax. The group stated ways they could improve their relaxation techniques in the future.  Individuals participated in the intervention "Time to Relax" where they had a chance to experience different relaxation techniques.  Clinical Observations/Feedback:  Patient came to group and stated relation is when your body feels care free. He expressed that relaxation is needed because it is the only way he can function and give your mind time to rest. Individual identified he deep breaths to relax and he spends at least 8 hours relaxing. Patient participated in the intervention and was social with peers and staff during group. [name] LRT/CTRS         Peter Wilson 04/17/2018 12:02 PM

## 2018-04-17 NOTE — Progress Notes (Signed)
Patient ID: Peter Wilson, male   DOB: January 24, 1976, 42 y.o.   MRN: [id number] PER STATE REGULATIONS 482.30  THIS CHART WAS REVIEWED FOR MEDICAL NECESSITY WITH RESPECT TO THE PATIENT'S ADMISSION/ DURATION OF STAY.  NEXT REVIEW DATE: 04/21/2018  Peter RoughJENNIFER JONES Zanayah Shadowens, RN, BSN CASE MANAGER

## 2018-04-17 NOTE — Progress Notes (Signed)
  [facility] :  Will you be returning to the same living situation after discharge:  No with [name] [address] [place] At discharge, do you have transportation home?: Yes,  [name] Do you have the ability to pay for your medications: Yes,     Release of information consent forms completed and in the chart;  Patient's signature needed at discharge.  Patient to Follow up at: Follow-up Information    [facility] Go on 04/19/2018.   Why:  12:30pm for Hospital Follow up.  At this appointment they will get you on Dr. Clent Wilson's schedule for medication management. Contact information: [address] [place] [address] [phone]           Next level of care provider [facility] Link:no  Safety Planning and Suicide Prevention discussed: Yes,     Have you used any form of tobacco in the last 30 days? (Cigarettes, Smokeless Tobacco, Cigars, and/or Pipes): No  Has patient been referred to the Quitline?: Patient refused referral  Patient has been referred for addiction treatment: Yes  Peter MacSara P Cadince Hilscher, [name] 04/17/2018, 3:49 PM

## 2018-04-17 NOTE — Progress Notes (Signed)
Patient ID: Peter Wilson, male   DOB: [birth year], 42 y.o.   MRN: [id number] Family meeting with Pt, RN Peter DeisShay, RN Peter Wilson, CSW, and Pt's friends Peter Wilson and Peter Wilson. Pt will be living with Peter Wilson at discharge and she wanted to make sure that she laid ground rules, No marajuana use, no leaving without telling her where he would be and when to expect him back home. He also is expected to take his medications and she would be watching him take them.  RN reviewed Pt discharge plan and expectations. Peter Wilson made it clear that if he "acts up" she plans to bring him right back to the hosptal.  Jake SharkSara Alora Gorey, [name]

## 2018-04-17 NOTE — Tx Team (Signed)
Interdisciplinary Treatment and Diagnostic Plan Update  04/17/2018 Time of Session: 10:50 am Rumi Taras MRN: [id number]  Principal Diagnosis: Schizoaffective disorder, bipolar type [facility])  Secondary Diagnoses: Principal Problem:   Schizoaffective disorder, bipolar type (HCC) Active Problems:   Tobacco use disorder   Cannabis use disorder, moderate, dependence (HCC)   Noncompliance   Current Medications:  Current Facility-Administered Medications  Medication Dose Route Frequency Provider Last Rate Last Dose  . acetaminophen (TYLENOL) tablet 650 mg  650 mg Oral Q6H PRN Clapacs, [name], MD   650 mg at 04/16/18 1939  . cloZAPine (CLOZARIL) tablet 200 mg  200 mg Oral QHS [name], [name], MD      . diphenhydrAMINE (BENADRYL) capsule 50 mg  50 mg Oral Q6H PRN Beverly Sessions, MD   50 mg at 04/14/18 2145  . divalproex (DEPAKOTE) DR tablet 500 mg  500 mg Oral Q8H [name], [name], MD   500 mg at 04/17/18 0559  . feeding supplement (ENSURE ENLIVE) (ENSURE ENLIVE) liquid 237 mL  237 mL Oral TID BM [name], [name], MD   237 mL at 04/16/18 1954  . haloperidol (HALDOL) tablet 10 mg  10 mg Oral Q6H PRN [name], [name], MD   10 mg at 04/14/18 2145  . haloperidol (HALDOL) tablet 10 mg  10 mg Oral Q breakfast [name], [name], MD   10 mg at 04/17/18 0746  . [START ON 04/23/2018] haloperidol decanoate (HALDOL DECANOATE) 100 MG/ML injection 250 mg  250 mg Intramuscular Q21 days [name], [name], MD      . lithium carbonate capsule 300 mg  300 mg Oral TID WC [name], [name], MD   300 mg at 04/17/18 1217  . LORazepam (ATIVAN) tablet 2 mg  2 mg Oral Q6H PRN [name], [name], MD   2 mg at 04/14/18 2145  . [START ON 04/18/2018] metFORMIN (GLUCOPHAGE) tablet 500 mg  500 mg Oral Q breakfast [name], [name], MD      . mirtazapine (REMERON) tablet 15 mg  15 mg Oral QHS Clapacs, [name], MD   15 mg at 04/16/18 1940  . phenylephrine (SUDAFED PE) tablet 10 mg  10 mg  Oral Q4H PRN Beverly Sessions, MD   10 mg at 04/13/18 1502  . SUMAtriptan (IMITREX) tablet 50 mg  50 mg Oral Q2H PRN [name], [name], MD   50 mg at 04/17/18 1224  . traZODone (DESYREL) tablet 100 mg  100 mg Oral QHS [name], [name], MD   100 mg at 04/16/18 1939   Current Outpatient Medications  Medication Sig Dispense Refill  . cloZAPine (CLOZARIL) 200 MG tablet Take 1 tablet (200 mg total) by mouth at bedtime. 30 tablet 1  . divalproex (DEPAKOTE) 500 MG DR tablet Take 1 tablet (500 mg total) by mouth every 8 (eight) hours. 90 tablet 1  . haloperidol decanoate (HALDOL DECANOATE) 100 MG/ML injection Inject 2.5 mLs (250 mg total) into the muscle every 21 ( twenty-one) days. Next dose on 4/30 2.5 mL 1  . lithium carbonate 300 MG capsule Take 1 capsule (300 mg total) by mouth 3 (three) times daily with meals. 90 capsule 1  . [START ON 04/18/2018] metFORMIN (GLUCOPHAGE) 500 MG tablet Take 1 tablet (500 mg total) by mouth daily with breakfast. 30 tablet 1  . mirtazapine (REMERON) 15 MG tablet Take 1 tablet (15 mg total) by mouth at bedtime. 30 tablet 1  . SUMAtriptan (IMITREX) 50 MG tablet Take 1 tablet (50 mg total) by  mouth every 2 (two) hours as needed for migraine or headache. May repeat in 2 hours if headache persists or recurs. 10 tablet 0  . traZODone (DESYREL) 100 MG tablet Take 1 tablet (100 mg total) by mouth at bedtime. 30 tablet 1   PTA Medications: No medications prior to admission.    Patient Stressors: Financial difficulties Medication change or noncompliance  Patient Strengths: Capable of independent living Motivation for treatment/growth Supportive family/friends  Treatment Modalities: Medication Management, Group therapy, Case management,  1 to 1 session with clinician, Psychoeducation, Recreational therapy.   Physician Treatment Plan for Primary Diagnosis: Schizoaffective disorder, bipolar type (HCC) Long Term Goal(s): Improvement in symptoms so as ready for  discharge Improvement in symptoms so as ready for discharge   Short Term Goals: Ability to identify changes in lifestyle to reduce recurrence of condition will improve Ability to verbalize feelings will improve Ability to disclose and discuss suicidal ideas Ability to demonstrate self-control will improve Ability to identify and develop effective coping behaviors will improve Ability to maintain clinical measurements within normal limits will improve Compliance with prescribed medications will improve Ability to identify triggers associated with substance abuse/mental health issues will improve Ability to identify changes in lifestyle to reduce recurrence of condition will improve Ability to demonstrate self-control will improve Ability to identify triggers associated with substance abuse/mental health issues will improve  Medication Management: Evaluate patient's response, side effects, and tolerance of medication regimen.  Therapeutic Interventions: 1 to 1 sessions, Unit Group sessions and Medication administration.  Evaluation of Outcomes: Progressing  Physician Treatment Plan for Secondary Diagnosis: Principal Problem:   Schizoaffective disorder, bipolar type (HCC) Active Problems:   Tobacco use disorder   Cannabis use disorder, moderate, dependence (HCC)   Noncompliance  Long Term Goal(s): Improvement in symptoms so as ready for discharge Improvement in symptoms so as ready for discharge   Short Term Goals: Ability to identify changes in lifestyle to reduce recurrence of condition will improve Ability to verbalize feelings will improve Ability to disclose and discuss suicidal ideas Ability to demonstrate self-control will improve Ability to identify and develop effective coping behaviors will improve Ability to maintain clinical measurements within normal limits will improve Compliance with prescribed medications will improve Ability to identify triggers associated with  substance abuse/mental health issues will improve Ability to identify changes in lifestyle to reduce recurrence of condition will improve Ability to demonstrate self-control will improve Ability to identify triggers associated with substance abuse/mental health issues will improve     Medication Management: Evaluate patient's response, side effects, and tolerance of medication regimen.  Therapeutic Interventions: 1 to 1 sessions, Unit Group sessions and Medication administration.  Evaluation of Outcomes: Adequate for discharge   RN Treatment Plan for Primary Diagnosis: Schizoaffective disorder, bipolar type (HCC) Long Term Goal(s): Knowledge of disease and therapeutic regimen to maintain health will improve  Short Term Goals: Ability to identify and develop effective coping behaviors will improve and Compliance with prescribed medications will improve  Medication Management: RN will administer medications as ordered by provider, will assess and evaluate patient's response and provide education to patient for prescribed medication. RN will report any adverse and/or side effects to prescribing provider.  Therapeutic Interventions: 1 on 1 counseling sessions, Psychoeducation, Medication administration, Evaluate responses to treatment, Monitor vital signs and CBGs as ordered, Perform/monitor CIWA, COWS, AIMS and Fall Risk screenings as ordered, Perform wound care treatments as ordered.  Evaluation of Outcomes: Adequate for discharge   [name] Treatment Plan for Primary Diagnosis: Schizoaffective  disorder, bipolar type [facility](HCC) Long Term Goal(s): Safe transition to appropriate next level of care at discharge, Engage patient in therapeutic group addressing interpersonal concerns.  Short Term Goals: Engage patient in aftercare planning with referrals and resources, Identify triggers associated with mental health/substance abuse issues and Increase skills for wellness and recovery  Therapeutic  Interventions: Assess for all discharge needs, 1 to 1 time with Social worker, Explore available resources and support systems, Assess for adequacy in community support network, Educate family and significant other(s) on suicide prevention, Complete Psychosocial Assessment, Interpersonal group therapy.  Evaluation of Outcomes: Adequate for discharge   Progress in Treatment: Attending groups: No. Participating in groups: No. Taking medication as prescribed: Yes. Toleration medication: Yes. Family/Significant other contact made: Yes, individual(s) contacted:  CSW has contacted ptt's payee, Larina BrasNina Womack, per his request. Patient understands diagnosis: Yes. Discussing patient identified problems/goals with staff: Yes. Medical problems stabilized or resolved: Yes. Denies suicidal/homicidal ideation: Yes. Issues/concerns per patient self-inventory: No. Other:    New problem(s) identified: No, Describe:     New Short Term/Long Term Goal(s):to get back on medicine and get out of here.  Discharge Plan or Barriers:  Discharge home with Ms, Wiley [address], [facility] and Dr.  Bard HerbertMoffit. [facility]  Reason for Continuation of Hospitalization: None  Estimated Length of Stay: 0 days  Recreational Therapy: Patient Stressors: Family, Friends, Relationship Patient Goal: Patient will successfully identify 2 ways of making healthy decisions post d/c within 5 recreation therapy group sessions  Attendees: Patient: 04/17/2018 3:54 PM  Physician: Kristine Lineajolanta [name], MD 04/17/2018 3:54 PM  Nursing: Hulan AmatoGwen Farrish, RN 04/17/2018 3:54 PM  RN Care Manager: 04/17/2018 3:54 PM  Social Worker: Jake SharkSara Akiel Fennell, [name] 04/17/2018 3:54 PM  Recreational Therapist: Garret ReddishShay Outlaw, LRT 04/17/2018 3:54 PM  Other: Heidi DachKelsey Craig, [name] 04/17/2018 3:54 PM  Other: Johny Shearsassandra Jarrett, LCSWA  04/17/2018 3:54 PM  Other: 04/17/2018 3:54 PM    Scribe for Treatment Team: Glennon MacSara P Brahm Barbeau,  [name] 04/17/2018 3:54 PM

## 2018-04-24 ENCOUNTER — Other Ambulatory Visit: Payer: Self-pay

## 2018-04-24 ENCOUNTER — Encounter: Payer: Self-pay | Admitting: *Deleted

## 2018-04-24 ENCOUNTER — Emergency Department
Admission: EM | Admit: 2018-04-24 | Discharge: 2018-04-24 | Disposition: A | Discharge status: Home / Self Care | Payer: Medicare Other | Attending: Emergency Medicine | Admitting: Emergency Medicine

## 2018-04-24 DIAGNOSIS — Z7984 Long term (current) use of oral hypoglycemic drugs: Secondary | ICD-10-CM | POA: Diagnosis not present

## 2018-04-24 DIAGNOSIS — F250 Schizoaffective disorder, bipolar type: Secondary | ICD-10-CM

## 2018-04-24 DIAGNOSIS — R251 Tremor, unspecified: Secondary | ICD-10-CM | POA: Diagnosis present

## 2018-04-24 DIAGNOSIS — Z79899 Other long term (current) drug therapy: Secondary | ICD-10-CM | POA: Insufficient documentation

## 2018-04-24 DIAGNOSIS — J45909 Unspecified asthma, uncomplicated: Secondary | ICD-10-CM | POA: Insufficient documentation

## 2018-04-24 DIAGNOSIS — F17210 Nicotine dependence, cigarettes, uncomplicated: Secondary | ICD-10-CM | POA: Insufficient documentation

## 2018-04-24 LAB — LITHIUM LEVEL: LITHIUM LVL: 0.53 mmol/L — AB (ref 0.60–1.20)

## 2018-04-24 MED ORDER — BENZTROPINE MESYLATE 1 MG PO TABS
ORAL_TABLET | ORAL | Status: AC
  Filled 2018-04-24: qty 1

## 2018-04-24 MED ORDER — BENZTROPINE MESYLATE 1 MG PO TABS
1.0000 mg | ORAL_TABLET | ORAL | Status: AC
  Administered 2018-04-24: 1 mg via ORAL

## 2018-04-24 MED ORDER — BENZTROPINE MESYLATE 1 MG PO TABS: 1.0000 mg | ORAL_TABLET | Freq: Two times a day (BID) | ORAL | 1 refills | Status: DC

## 2018-04-24 NOTE — Consult Note (Signed)
[facility] Face-to-Face Psychiatry Consult   Reason for Consult: Consult for 42 year old man with schizoaffective disorder who came to the emergency room reporting that he has had a new onset tremor in his hands Referring Physician: McShane Patient Identification: Peter Wilson MRN:  [id number] Principal Diagnosis: Schizoaffective disorder, bipolar type [facility]) Diagnosis:   Patient Active Problem List   Diagnosis Date Noted  . Tremor [R25.1] 04/24/2018  . Schizophrenia (HCC) [F20.9] 04/01/2018  . Noncompliance [Z91.19] 04/10/2017  . Asthma [J45.909] 07/13/2016  . Schizoaffective disorder, bipolar type (HCC) [F25.0] 06/27/2016  . Antisocial traits [F60.2] 09/15/2015  . Tobacco use disorder [F17.200] 09/14/2015  . Cannabis use disorder, moderate, dependence (HCC) [F12.20] 09/14/2015    Total Time spent with patient: 45 minutes  Subjective:   Peter Wilson is a 42 y.o. male patient admitted with "my hands are shaking".  HPI: Patient seen chart reviewed.  This gentleman was discharged from the hospital just about 5 days ago after a lengthy stay.  He comes back to the emergency room today saying that he is having a tremor in his hands.  He says it has been going on ever since he got his Haldol shot in the hospital and not getting better.  Patient shows me his hands which are shaking pretty grossly.  Denies any acute mental health symptoms.  Says his mood has been fine.  Denies hallucinations.  Denies suicidal or homicidal thoughts.  He says he has been compliant with his medicine and denies that he has been abusing any substances.  Social history: Patient is living with a family member.  Chronically disabled.  Medical history: Generally in pretty good health.  Has mild asthma.  Does have a history of tardive dyskinesia.  Substance abuse history: History of cannabis abuse.  Past Psychiatric History: Patient has had several prior hospitalizations and was just discharged after a lengthy stay.  Now on  clozapine and lithium.  He was given a Haldol Decanoate shot during his recent hospitalization as well.  Patient has no history of suicidal behavior.  He gets very loud and somewhat disruptive when he is agitated but is not generally violent.  Risk to Self: Is patient at risk for suicide?: No Risk to Others:   Prior Inpatient Therapy:   Prior Outpatient Therapy:    Past Medical History:  Past Medical History:  Diagnosis Date  . Marijuana abuse   . Schizophrenia, paranoid (HCC)   . Tardive dyskinesia   . Tobacco abuse     Past Surgical History:  Procedure Laterality Date  . NO PAST SURGERIES     Family History: History reviewed. No pertinent family history. Family Psychiatric  History: None Social History:  Social History   Substance and Sexual Activity  Alcohol Use Yes  . Alcohol/week: 0.0 oz     Social History   Substance and Sexual Activity  Drug Use Yes  . Types: Marijuana    Social History   Socioeconomic History  . Marital status: Single    Spouse name: Not on file  . Number of children: Not on file  . Years of education: Not on file  . Highest education level: Not on file  Occupational History  . Not on file  Social Needs  . Financial resource strain: Not on file  . Food insecurity:    Worry: Not on file    Inability: Not on file  . Transportation needs:    Medical: Not on file    Non-medical: Not on file  Tobacco Use  .  Smoking status: Current Every Day Smoker    Packs/day: 2.00    Types: Cigarettes  . Smokeless tobacco: Never Used  Substance and Sexual Activity  . Alcohol use: Yes    Alcohol/week: 0.0 oz  . Drug use: Yes    Types: Marijuana  . Sexual activity: Not on file  Lifestyle  . Physical activity:    Days per week: Not on file    Minutes per session: Not on file  . Stress: Not on file  Relationships  . Social connections:    Talks on phone: Not on file    Gets together: Not on file    Attends religious service: Not on file     Active member of club or organization: Not on file    Attends meetings of clubs or organizations: Not on file    Relationship status: Not on file  Other Topics Concern  . Not on file  Social History Narrative  . Not on file   Additional Social History:    Allergies:   Allergies  Allergen Reactions  . Penicillins Rash    Labs:  Results for orders placed or performed during the hospital encounter of 04/24/18 (from the past 48 hour(s))  Lithium level     Status: Abnormal   Collection Time: 04/24/18  3:05 PM  Result Value Ref Range   Lithium Lvl 0.53 (L) 0.60 - 1.20 mmol/L    Comment: Performed at [facility], [address] [place], [place] [address]    Current Facility-Administered Medications  Medication Dose Route Frequency Provider Last Rate Last Dose  . benztropine (COGENTIN) 1 MG tablet            Current Outpatient Medications  Medication Sig Dispense Refill  . benztropine (COGENTIN) 1 MG tablet Take 1 tablet (1 mg total) by mouth 2 (two) times daily. 60 tablet 1  . cloZAPine (CLOZARIL) 200 MG tablet Take 1 tablet (200 mg total) by mouth at bedtime. 30 tablet 1  . divalproex (DEPAKOTE) 500 MG DR tablet Take 1 tablet (500 mg total) by mouth every 8 (eight) hours. 90 tablet 1  . haloperidol decanoate (HALDOL DECANOATE) 100 MG/ML injection Inject 2.5 mLs (250 mg total) into the muscle every 21 ( twenty-one) days. Next dose on 4/30 2.5 mL 1  . lithium carbonate 300 MG capsule Take 1 capsule (300 mg total) by mouth 3 (three) times daily with meals. 90 capsule 1  . metFORMIN (GLUCOPHAGE) 500 MG tablet Take 1 tablet (500 mg total) by mouth daily with breakfast. 30 tablet 1  . mirtazapine (REMERON) 15 MG tablet Take 1 tablet (15 mg total) by mouth at bedtime. 30 tablet 1  . SUMAtriptan (IMITREX) 50 MG tablet Take 1 tablet (50 mg total) by mouth every 2 (two) hours as needed for migraine or headache. May repeat in 2 hours if headache persists or recurs. 10 tablet 0  .  traZODone (DESYREL) 100 MG tablet Take 1 tablet (100 mg total) by mouth at bedtime. 30 tablet 1    Musculoskeletal: Strength & Muscle Tone: within normal limits Gait & Station: normal Patient leans: N/A  Psychiatric Specialty Exam: Physical Exam  Nursing note and vitals reviewed. Constitutional: He appears well-developed and well-nourished.  HENT:  Head: Normocephalic and atraumatic.  Eyes: Pupils are equal, round, and reactive to light. Conjunctivae are normal.  Neck: Normal range of motion.  Cardiovascular: Regular rhythm and normal heart sounds.  Respiratory: Effort normal. No respiratory distress.  GI: Soft.  Musculoskeletal:  Normal range of motion.  Neurological: He is alert. He displays tremor.  Patient had a gross tremor in his upper extremities.  Some of it looks suspiciously exaggerated but it does look like he has a little bit of underlying tremor.  No other acute abnormality.  Skin: Skin is warm and dry.  Psychiatric: He has a normal mood and affect. His speech is normal and behavior is normal. Judgment and thought content normal. Cognition and memory are normal.    Review of Systems  Constitutional: Negative.   HENT: Negative.   Eyes: Negative.   Respiratory: Negative.   Cardiovascular: Negative.   Gastrointestinal: Negative.   Musculoskeletal: Negative.   Skin: Negative.   Neurological: Positive for tremors.  Psychiatric/Behavioral: Negative.     Blood pressure 115/74, pulse 84, temperature 99 F (37.2 C), temperature source Oral, resp. rate 18, height 6' (1.829 m), weight 81.2 kg (179 lb), SpO2 98 %.Body mass index is 24.28 kg/m.  General Appearance: Fairly Groomed  Eye Contact:  Good  Speech:  Clear and Coherent  Volume:  Normal  Mood:  Euthymic  Affect:  Appropriate  Thought Process:  Goal Directed  Orientation:  Full (Time, Place, and Person)  Thought Content:  Logical  Suicidal Thoughts:  No  Homicidal Thoughts:  No  Memory:  Immediate;    Fair Recent;   Fair Remote;   Fair  Judgement:  Good  Insight:  Good  Psychomotor Activity:  Tremor  Concentration:  Concentration: Fair  Recall:  [employer] of Knowledge:  Fair  Language:  Fair  Akathisia:  Negative  Handed:  Right  AIMS (if indicated):     Assets:  Desire for Improvement Housing Physical Health Resilience  ADL's:  Intact  Cognition:  WNL  Sleep:        Treatment Plan Summary: Medication management and Plan 69-year-old man with schizoaffective disorder.  Patient is suffering from a tremor in his hand.  His Cogentin was discontinued during his last hospitalization.  He is clear that he thinks that Cogentin is going to take care of this and he is specifically asking for it.  Patient is not showing any signs of acute psychosis or dangerousness.  Very calm and appropriate and neatly groomed.  Because he is on lithium I decided to check the lithium level to look for any other serious cause of the tremor.  Lithium level is 0.53 which is essentially what it was when he was discharged.  I have written out a prescription for 1 mg of Cogentin twice a day and also given him some Cogentin here in the emergency room.  Case reviewed with emergency room doctor.  Patient I believe can probably be discharged safely home and continue follow-up as usual.  Disposition: No evidence of imminent risk to self or others at present.   Patient does not meet criteria for psychiatric inpatient admission. Supportive therapy provided about ongoing stressors.  Mordecai Rasmussen, MD 04/24/2018 4:01 PM

## 2018-04-24 NOTE — ED Triage Notes (Addendum)
Pt to ED reporting that he has been shaking for the past 6 days. Pt verbalized that he feels as though the new medications he was started on could be causing the shaking. Pt was given a IM shot of Haldol three days ago  That he reports did not help. PT requesting Cogentin to help and reports it has helped in the past.  Pt was admitted on 4/8 to psych for agitation and schizophrenia and discharged on 4/24. Pt reports he was not shaking when he was discharged. Pt also reports he has been doing better with the agitation and "spacing out" No SI and HI. No hallucination. Pt reports he has been compliant with medication.   Pt was started on:  200 mg clozapine at bedtime Trazodone  at bedtime Mirtazapine 15 mg at bedtime Metformin 500 mg at breakfast Lithium Carb  TID and continues to take divalproex  q8hours

## 2018-04-24 NOTE — ED Provider Notes (Addendum)
[facility] Emergency Department Provider Note  ___________________________________________   First MD Initiated Contact with Patient 04/24/18 1556     (approximate)  I have reviewed the triage vital signs and the nursing notes.   HISTORY  Chief Complaint Shaking   HPI Peter Wilson is a 42 y.o. male with a history of tardive dyskinesia as well as schizophrenia was presenting to the emergency department with shaking to his bilateral upper extremities.  He says that he believes this is from an increase, recently, of his Haldol dosing.  He is not reporting any suicidal or homicidal ideation.  Dr. Toni Amend evaluated the patient and says that he should be tried with Cogentin.  However, if labs appear normal then he may be discharged home.  Past Medical History:  Diagnosis Date  . Marijuana abuse   . Schizophrenia, paranoid (HCC)   . Tardive dyskinesia   . Tobacco abuse     Patient Active Problem List   Diagnosis Date Noted  . Tremor 04/24/2018  . Schizophrenia (HCC) 04/01/2018  . Noncompliance 04/10/2017  . Asthma 07/13/2016  . Schizoaffective disorder, bipolar type (HCC) 06/27/2016  . Antisocial traits 09/15/2015  . Tobacco use disorder 09/14/2015  . Cannabis use disorder, moderate, dependence (HCC) 09/14/2015    Past Surgical History:  Procedure Laterality Date  . NO PAST SURGERIES      Prior to Admission medications   Medication Sig Start Date End Date Taking? Authorizing Provider  benztropine (COGENTIN) 1 MG tablet Take 1 tablet (1 mg total) by mouth 2 (two) times daily. 04/24/18 06/23/18  Clapacs, [name], MD  cloZAPine (CLOZARIL) 200 MG tablet Take 1 tablet (200 mg total) by mouth at bedtime. 04/17/18   [name], Braulio Conte B, MD  divalproex (DEPAKOTE) 500 MG DR tablet Take 1 tablet (500 mg total) by mouth every 8 (eight) hours. 04/17/18   [name], [name], MD  haloperidol decanoate (HALDOL DECANOATE) 100 MG/ML injection Inject 2.5 mLs (250 mg  total) into the muscle every 21 ( twenty-one) days. Next dose on 4/30 04/17/18   [name], [name], MD  lithium carbonate 300 MG capsule Take 1 capsule (300 mg total) by mouth 3 (three) times daily with meals. 04/17/18   [name], Braulio Conte B, MD  metFORMIN (GLUCOPHAGE) 500 MG tablet Take 1 tablet (500 mg total) by mouth daily with breakfast. 04/18/18   [name], [name], MD  mirtazapine (REMERON) 15 MG tablet Take 1 tablet (15 mg total) by mouth at bedtime. 04/17/18   [name], Braulio Conte B, MD  SUMAtriptan (IMITREX) 50 MG tablet Take 1 tablet (50 mg total) by mouth every 2 (two) hours as needed for migraine or headache. May repeat in 2 hours if headache persists or recurs. 04/17/18   [name], Braulio Conte B, MD  traZODone (DESYREL) 100 MG tablet Take 1 tablet (100 mg total) by mouth at bedtime. 04/17/18   [name], Ellin Goodie, MD    Allergies Penicillins  History reviewed. No pertinent family history.  Social History Social History   Tobacco Use  . Smoking status: Current Every Day Smoker    Packs/day: 2.00    Types: Cigarettes  . Smokeless tobacco: Never Used  Substance Use Topics  . Alcohol use: Yes    Alcohol/week: 0.0 oz  . Drug use: Yes    Types: Marijuana    Review of Systems  Constitutional: No fever/chills Eyes: No visual changes. ENT: No sore throat. Cardiovascular: Denies chest pain. Respiratory: Denies shortness of breath. Gastrointestinal: No abdominal pain.  No  nausea, no vomiting.  No diarrhea.  No constipation. Genitourinary: Negative for dysuria. Musculoskeletal: Negative for back pain. Skin: Negative for rash. Neurological: Negative for headaches, focal weakness or numbness.   ____________________________________________   PHYSICAL EXAM:  VITAL SIGNS: ED Triage Vitals  Enc Vitals Group     BP 04/24/18 1224 115/74     Pulse Rate 04/24/18 1224 84     Resp 04/24/18 1224 18     Temp 04/24/18 1224 99 F (37.2 C)     Temp Source 04/24/18 1224 Oral       SpO2 04/24/18 1224 98 %     Weight 04/24/18 1227 179 lb (81.2 kg)     Height 04/24/18 1227 6' (1.829 m)     Head Circumference --      Peak Flow --      Pain Score 04/24/18 1244 0     Pain Loc --      Pain Edu? --      Excl. in GC? --     Constitutional: Alert and oriented. Well appearing and in no acute distress. Eyes: Conjunctivae are normal.  Head: Atraumatic. Nose: No congestion/rhinnorhea. Mouth/Throat: Mucous membranes are moist.  Neck: No stridor.   Cardiovascular: Normal rate, regular rhythm. Grossly normal heart sounds.   Respiratory: Normal respiratory effort.  No retractions. Lungs CTAB. Gastrointestinal: Soft and nontender. No distention.  Musculoskeletal: No lower extremity tenderness nor edema.  No joint effusions. Neurologic:  Normal speech and language. No gross focal neurologic deficits are appreciated.  No tremulousness at this time. Skin:  Skin is warm, dry and intact. No rash noted. Psychiatric: Mood and affect are normal. Speech and behavior are normal.  ____________________________________________   LABS (all labs ordered are listed, but only abnormal results are displayed)  Labs Reviewed  LITHIUM LEVEL - Abnormal; Notable for the following components:      Result Value   Lithium Lvl 0.53 (*)    All other components within normal limits   ____________________________________________  EKG   ____________________________________________  RADIOLOGY   ____________________________________________   PROCEDURES  Procedure(s) performed:   Procedures  Critical Care performed:   ____________________________________________   INITIAL IMPRESSION / ASSESSMENT AND PLAN / ED COURSE  Pertinent labs & imaging results that were available during my care of the patient were reviewed by me and considered in my medical decision making (see chart for details).   DDX: Resting tremor, tardive dyskinesia, extraparametal symptoms, serotonin syndrome,  alcohol withdrawal, medication side effect As part of my medical decision making, I reviewed the following data within the electronic MEDICAL RECORD NUMBER Notes from prior ED visits  Patient, after administration of Cogentin, without complaints of tremulousness.  Will be discharged with Cogentin.  Patient understanding the plan willing to comply. ____________________________________________   FINAL CLINICAL IMPRESSION(S) / ED DIAGNOSES  Tremor.    NEW MEDICATIONS STARTED DURING THIS VISIT:  Current Discharge Medication List    START taking these medications   Details  benztropine (COGENTIN) 1 MG tablet Take 1 tablet (1 mg total) by mouth 2 (two) times daily. Qty: 60 tablet, Refills: 1         Note:  This document was prepared using Dragon voice recognition software and may include unintentional dictation errors.     [name], MD 04/24/18 1606  Discussed the case with Dr. Toni Amend he says that the patient does not had to have his Haldol adjusted or discontinued.  Says that he was taken off of his Cogentin recently and  that the patient needs to be on Cogentin chronically and that he may discuss his medications further as an outpatient but there is no change necessary at this time beyond the re-adding of his Cogentin.    [name], MD 04/24/18 [phone]

## 2018-04-24 NOTE — ED Notes (Signed)
BEHAVIORAL HEALTH ROUNDING Patient sleeping: No. Patient alert and oriented: yes Behavior appropriate: Yes.  ; If no, describe:  Nutrition and fluids offered: yes Toileting and hygiene offered: Yes  Sitter present: q15 minute observations and security monitoring Law enforcement present: Yes    

## 2018-04-24 NOTE — ED Notes (Signed)

## 2018-04-24 NOTE — ED Notes (Signed)
Urine sample sent with save label if needed.

## 2018-04-29 ENCOUNTER — Emergency Department: Payer: Medicare Other

## 2018-04-29 ENCOUNTER — Encounter: Payer: Self-pay | Admitting: Emergency Medicine

## 2018-04-29 ENCOUNTER — Other Ambulatory Visit: Payer: Self-pay

## 2018-04-29 DIAGNOSIS — R1084 Generalized abdominal pain: Secondary | ICD-10-CM | POA: Diagnosis present

## 2018-04-29 DIAGNOSIS — F200 Paranoid schizophrenia: Secondary | ICD-10-CM | POA: Insufficient documentation

## 2018-04-29 DIAGNOSIS — Z79899 Other long term (current) drug therapy: Secondary | ICD-10-CM | POA: Diagnosis not present

## 2018-04-29 DIAGNOSIS — F1210 Cannabis abuse, uncomplicated: Secondary | ICD-10-CM | POA: Insufficient documentation

## 2018-04-29 DIAGNOSIS — K5900 Constipation, unspecified: Secondary | ICD-10-CM | POA: Insufficient documentation

## 2018-04-29 DIAGNOSIS — F17210 Nicotine dependence, cigarettes, uncomplicated: Secondary | ICD-10-CM | POA: Diagnosis not present

## 2018-04-29 DIAGNOSIS — K5641 Fecal impaction: Secondary | ICD-10-CM | POA: Insufficient documentation

## 2018-04-29 DIAGNOSIS — J45909 Unspecified asthma, uncomplicated: Secondary | ICD-10-CM | POA: Diagnosis not present

## 2018-04-29 DIAGNOSIS — Z7984 Long term (current) use of oral hypoglycemic drugs: Secondary | ICD-10-CM | POA: Insufficient documentation

## 2018-04-29 NOTE — ED Notes (Signed)
No answer when called several times from lobby 

## 2018-04-29 NOTE — ED Triage Notes (Signed)
Patient ambulatory to triage with steady gait, without difficulty or distress noted; pt reports no BM x wk with generalized abd pain; denies N/V;

## 2018-04-30 ENCOUNTER — Emergency Department: Payer: Medicare Other

## 2018-04-30 ENCOUNTER — Emergency Department
Admission: EM | Admit: 2018-04-30 | Discharge: 2018-04-30 | Disposition: A | Discharge status: Home / Self Care | Payer: Medicare Other | Attending: Emergency Medicine | Admitting: Emergency Medicine

## 2018-04-30 ENCOUNTER — Encounter: Payer: Self-pay | Admitting: Radiology

## 2018-04-30 DIAGNOSIS — F17210 Nicotine dependence, cigarettes, uncomplicated: Secondary | ICD-10-CM | POA: Insufficient documentation

## 2018-04-30 DIAGNOSIS — F1210 Cannabis abuse, uncomplicated: Secondary | ICD-10-CM

## 2018-04-30 DIAGNOSIS — K5641 Fecal impaction: Secondary | ICD-10-CM | POA: Insufficient documentation

## 2018-04-30 DIAGNOSIS — K5900 Constipation, unspecified: Secondary | ICD-10-CM

## 2018-04-30 DIAGNOSIS — J45909 Unspecified asthma, uncomplicated: Secondary | ICD-10-CM

## 2018-04-30 DIAGNOSIS — F200 Paranoid schizophrenia: Secondary | ICD-10-CM | POA: Insufficient documentation

## 2018-04-30 DIAGNOSIS — R1084 Generalized abdominal pain: Secondary | ICD-10-CM

## 2018-04-30 DIAGNOSIS — Z79899 Other long term (current) drug therapy: Secondary | ICD-10-CM

## 2018-04-30 DIAGNOSIS — Z7984 Long term (current) use of oral hypoglycemic drugs: Secondary | ICD-10-CM | POA: Insufficient documentation

## 2018-04-30 LAB — COMPREHENSIVE METABOLIC PANEL
ALBUMIN: 4 g/dL (ref 3.5–5.0)
ALK PHOS: 51 U/L (ref 38–126)
ALT: 9 U/L — ABNORMAL LOW (ref 17–63)
AST: 29 U/L (ref 15–41)
Anion gap: 8 (ref 5–15)
BILIRUBIN TOTAL: 0.4 mg/dL (ref 0.3–1.2)
BUN: 8 mg/dL (ref 6–20)
CO2: 27 mmol/L (ref 22–32)
Calcium: 9.1 mg/dL (ref 8.9–10.3)
Chloride: 104 mmol/L (ref 101–111)
Creatinine, Ser: 0.91 mg/dL (ref 0.61–1.24)
GFR calc Af Amer: >60 mL/min (ref >60)
GFR calc non Af Amer: >60 mL/min (ref >60)
GLUCOSE: 108 mg/dL — AB (ref 65–99)
POTASSIUM: 3.7 mmol/L (ref 3.5–5.1)
Sodium: 139 mmol/L (ref 135–145)
TOTAL PROTEIN: 7.8 g/dL (ref 6.5–8.1)

## 2018-04-30 LAB — CBC
HEMATOCRIT: 35.3 % — AB (ref 40.0–52.0)
Hemoglobin: 11.6 g/dL — ABNORMAL LOW (ref 13.0–18.0)
MCH: 29.5 pg (ref 26.0–34.0)
MCHC: 32.9 g/dL (ref 32.0–36.0)
MCV: 89.7 fL (ref 80.0–100.0)
Platelets: 382 10*3/uL (ref 150–440)
RBC: 3.93 MIL/uL — ABNORMAL LOW (ref 4.40–5.90)
RDW: 14.8 % — AB (ref 11.5–14.5)
WBC: 5.5 10*3/uL (ref 3.8–10.6)

## 2018-04-30 LAB — LIPASE, BLOOD: Lipase: 35 U/L (ref 11–51)

## 2018-04-30 MED ORDER — CIPROFLOXACIN HCL 500 MG PO TABS: 500.0000 mg | ORAL_TABLET | Freq: Two times a day (BID) | ORAL | 0 refills | Status: AC

## 2018-04-30 MED ORDER — METRONIDAZOLE 500 MG PO TABS: 500.0000 mg | ORAL_TABLET | Freq: Two times a day (BID) | ORAL | 0 refills | Status: AC

## 2018-04-30 MED ORDER — POLYETHYLENE GLYCOL 3350 17 G PO PACK: 17.0000 g | PACK | Freq: Two times a day (BID) | ORAL | 0 refills | Status: DC

## 2018-04-30 MED ORDER — DOCUSATE SODIUM 100 MG PO CAPS: 100.0000 mg | ORAL_CAPSULE | Freq: Two times a day (BID) | ORAL | 0 refills | Status: AC

## 2018-04-30 MED ORDER — IOPAMIDOL (ISOVUE-370) INJECTION 76%
75.0000 mL | Freq: Once | INTRAVENOUS | Status: AC | PRN
  Administered 2018-04-30: 75 mL via INTRAVENOUS

## 2018-04-30 NOTE — ED Notes (Addendum)
Report off to lisa rn  

## 2018-04-30 NOTE — ED Notes (Signed)
CT tech reports pt refusing IV; explained to pt purpose of CT and reasoning for IV; pt believes that he has already had CT; explained to pt that he has had xray only, not CT; pt voices understanding and agreeance to have IV; pt st that he cont to have urge to have BM with no results; pt is requesting something to eat; explained to pt purpose of remaining NPO until results received

## 2018-04-30 NOTE — ED Provider Notes (Signed)
[facility] Emergency Department Provider Note   ____________________________________________   First MD Initiated Contact with Patient 04/30/18 0240     (approximate)  I have reviewed the triage vital signs and the nursing notes.   HISTORY  Chief Complaint Abdominal Pain and Constipation    HPI Peter Wilson is a 42 y.o. male who comes into the hospital today with some abdominal pain.  He states that he had over the last couple of days.  He reports it is from his bellybutton across to his sides.  The patient states that trazodone is making him constipated.  He states that he is taking laxatives but then states he is taking Epsom salts.  His last bowel movement was about 5 to 7 days ago.  He denies any nausea or vomiting and rates his pain a 10 out of 10 in intensity.  He has had no fevers.  He reports that he has not had this before.  He denies any chest pain or shortness of breath.  The patient is here today for evaluation of his symptoms.   Past Medical History:  Diagnosis Date  . Marijuana abuse   . Schizophrenia, paranoid (HCC)   . Tardive dyskinesia   . Tobacco abuse     Patient Active Problem List   Diagnosis Date Noted  . Tremor 04/24/2018  . Schizophrenia (HCC) 04/01/2018  . Noncompliance 04/10/2017  . Asthma 07/13/2016  . Schizoaffective disorder, bipolar type (HCC) 06/27/2016  . Antisocial traits 09/15/2015  . Tobacco use disorder 09/14/2015  . Cannabis use disorder, moderate, dependence (HCC) 09/14/2015    Past Surgical History:  Procedure Laterality Date  . NO PAST SURGERIES      Prior to Admission medications   Medication Sig Start Date End Date Taking? Authorizing Provider  benztropine (COGENTIN) 1 MG tablet Take 1 tablet (1 mg total) by mouth 2 (two) times daily. 04/24/18 06/23/18  Clapacs, [name], MD  ciprofloxacin (CIPRO) 500 MG tablet Take 1 tablet (500 mg total) by mouth 2 (two) times daily for 7 days. 04/30/18 05/07/18   Rebecka Apley, MD  cloZAPine (CLOZARIL) 200 MG tablet Take 1 tablet (200 mg total) by mouth at bedtime. 04/17/18   [name], Braulio Conte B, MD  divalproex (DEPAKOTE) 500 MG DR tablet Take 1 tablet (500 mg total) by mouth every 8 (eight) hours. 04/17/18   [name], Braulio Conte B, MD  docusate sodium (COLACE) 100 MG capsule Take 1 capsule (100 mg total) by mouth 2 (two) times daily. 04/30/18 04/30/19  Rebecka Apley, MD  haloperidol decanoate (HALDOL DECANOATE) 100 MG/ML injection Inject 2.5 mLs (250 mg total) into the muscle every 21 ( twenty-one) days. Next dose on 4/30 04/17/18   [name], [name], MD  lithium carbonate 300 MG capsule Take 1 capsule (300 mg total) by mouth 3 (three) times daily with meals. 04/17/18   [name], Braulio Conte B, MD  metFORMIN (GLUCOPHAGE) 500 MG tablet Take 1 tablet (500 mg total) by mouth daily with breakfast. 04/18/18   [name], [name], MD  metroNIDAZOLE (FLAGYL) 500 MG tablet Take 1 tablet (500 mg total) by mouth 2 (two) times daily for 7 days. 04/30/18 05/07/18  Rebecka Apley, MD  mirtazapine (REMERON) 15 MG tablet Take 1 tablet (15 mg total) by mouth at bedtime. 04/17/18   [name], [name], MD  polyethylene glycol (MIRALAX) packet Take 17 g by mouth 2 (two) times daily. 04/30/18   Rebecka Apley, MD  SUMAtriptan (IMITREX) 50 MG tablet Take  1 tablet (50 mg total) by mouth every 2 (two) hours as needed for migraine or headache. May repeat in 2 hours if headache persists or recurs. 04/17/18   [name], Braulio Conte B, MD  traZODone (DESYREL) 100 MG tablet Take 1 tablet (100 mg total) by mouth at bedtime. 04/17/18   [name], Ellin Goodie, MD    Allergies Penicillins  No family history on file.  Social History Social History   Tobacco Use  . Smoking status: Current Every Day Smoker    Packs/day: 2.00    Types: Cigarettes  . Smokeless tobacco: Never Used  Substance Use Topics  . Alcohol use: Yes    Alcohol/week: 0.0 oz  . Drug use: Yes    Types:  Marijuana    Review of Systems  Constitutional: No fever/chills Eyes: No visual changes. ENT: No sore throat. Cardiovascular: Denies chest pain. Respiratory: Denies shortness of breath. Gastrointestinal:  abdominal pain and constipation No nausea, no vomiting.  No diarrhea. Genitourinary: Negative for dysuria. Musculoskeletal: Negative for back pain. Skin: Negative for rash. Neurological: Negative for headaches, focal weakness or numbness.   ____________________________________________   PHYSICAL EXAM:  VITAL SIGNS: ED Triage Vitals [04/29/18 2342]  Enc Vitals Group     BP 130/78     Pulse Rate (!) 106     Resp 18     Temp 97.9 F (36.6 C)     Temp Source Oral     SpO2 97 %     Weight 170 lb (77.1 kg)     Height 6' (1.829 m)     Head Circumference      Peak Flow      Pain Score 10     Pain Loc      Pain Edu?      Excl. in GC?     Constitutional: Alert and oriented. Well appearing and in moderate distress. Eyes: Conjunctivae are normal. PERRL. EOMI. Head: Atraumatic. Nose: No congestion/rhinnorhea. Mouth/Throat: Mucous membranes are moist.  Oropharynx non-erythematous. Cardiovascular: Normal rate, regular rhythm. Grossly normal heart sounds.  Good peripheral circulation. Respiratory: Normal respiratory effort.  No retractions. Lungs CTAB. Gastrointestinal: Soft with some diffuse tenderness to palpation more in the upper abdomen. No distention.  Positive bowel sounds Musculoskeletal: No lower extremity tenderness nor edema.   Neurologic:  Normal speech and language.  Skin:  Skin is warm, dry and intact.  Psychiatric: Mood and affect are normal. .  ____________________________________________   LABS (all labs ordered are listed, but only abnormal results are displayed)  Labs Reviewed  COMPREHENSIVE METABOLIC PANEL - Abnormal; Notable for the following components:      Result Value   Glucose, Bld 108 (*)    ALT 9 (*)    All other components within normal  limits  CBC - Abnormal; Notable for the following components:   RBC 3.93 (*)    Hemoglobin 11.6 (*)    HCT 35.3 (*)    RDW 14.8 (*)    All other components within normal limits  LIPASE, BLOOD  URINALYSIS, COMPLETE (UACMP) WITH MICROSCOPIC   ____________________________________________  EKG  none ____________________________________________  RADIOLOGY  ED MD interpretation:  KUB: Nonobstructive bowel gas pattern, large volume of stool throughout the colon compatible with constipation.    CT abd and pelvis: Redundancy of the sigmoid colon in the left hemiabdomen with narrowed segments which may be secondary to chronic inflammation or scarring, there is air distention of the colon proximal to the sigmoid with mild infiltration inflammation of the omentum  in the left upper abdomen, no definite evidence of obstruction or twisting, large amount of stool in the rectal vault consistent with fecal impaction.  Official radiology report(s): Dg Abdomen 1 View  Result Date: 04/30/2018 CLINICAL DATA:  42 y/o M; generalized abdominal pain with no bowel movement for 1 week. EXAM: ABDOMEN - 1 VIEW COMPARISON:  None. FINDINGS: Nonobstructive bowel gas pattern. Large volume of stool throughout the colon. Bones are unremarkable. No radiopaque urinary stone disease identified. IMPRESSION: Nonobstructive bowel gas pattern. Large volume of stool throughout the colon compatible with constipation. Electronically Signed   By: Mitzi Hansen M.D.   On: 04/30/2018 01:43   Ct Abdomen Pelvis W Contrast  Result Date: 04/30/2018 CLINICAL DATA:  42 year old male with abdominal pain. EXAM: CT ABDOMEN AND PELVIS WITH CONTRAST TECHNIQUE: Multidetector CT imaging of the abdomen and pelvis was performed using the standard protocol following bolus administration of intravenous contrast. CONTRAST:  75mL ISOVUE-370 IOPAMIDOL (ISOVUE-370) INJECTION 76% COMPARISON:  Abdominal radiograph dated 04/30/2018 FINDINGS: Lower  chest: The visualized lung bases are clear. No intra-abdominal free air or free fluid. Hepatobiliary: No focal liver abnormality is seen. No gallstones, gallbladder wall thickening, or biliary dilatation. Pancreas: Unremarkable. No pancreatic ductal dilatation or surrounding inflammatory changes. Spleen: Normal in size without focal abnormality. Adrenals/Urinary Tract: Adrenal glands are unremarkable. Kidneys are normal, without renal calculi, focal lesion, or hydronephrosis. Bladder is unremarkable. Stomach/Bowel: There is large amount of stool in the rectal vault compatible with fecal impaction. There is focal area of narrowing of the sigmoid colon (series 2, image 56 and coronal series 6, image 37) which may be related to underdistention or represent an area of stricture. There is redundancy of the sigmoid colon in the left hemiabdomen with narrowed segments. There is no evidence of obstruction or twisting. There is air distention of the colon proximal to these narrow segments. The transverse colon measures approximately 8 cm in diameter. There is mild stranding of the upper omentum in the left upper quadrant (series 2 image 27) with trace fluid. No dilatation of the small bowel or evidence of obstruction. The appendix is unremarkable. Vascular/Lymphatic: No significant vascular findings are present. No enlarged abdominal or pelvic lymph nodes. Reproductive: The prostate and seminal vesicles are grossly unremarkable. Other: Small fat containing umbilical hernia. Musculoskeletal: No acute or significant osseous findings. IMPRESSION: 1. Redundancy of the sigmoid colon in the left hemiabdomen with narrowed segments which may be secondary to chronic inflammation or scarring/stricture. There is air distention of the colon proximal to the sigmoid with mild infiltration/inflammation of the omentum in the left upper abdomen. No definite evidence of obstruction or twisting. A colonic pseudo-obstruction is not entirely  excluded. Clinical correlation is recommended. There is no pneumatosis or portal venous gas. 2. Large amount of stool in the rectal vault consistent with fecal impaction. Electronically Signed   By: Elgie Collard M.D.   On: 04/30/2018 06:01    ____________________________________________   PROCEDURES  Procedure(s) performed: None  Procedures  Critical Care performed: No  ____________________________________________   INITIAL IMPRESSION / ASSESSMENT AND PLAN / ED COURSE  As part of my medical decision making, I reviewed the following data within the electronic MEDICAL RECORD NUMBER Notes from prior ED visits and [place] Controlled Substance Database  This is a 42 year old male who comes into the hospital today with abdominal pain.  He reports that he had a for a couple of days but has not had a bowel movement for a few days.  The patient is  a paranoid schizophrenic and may not be the most reliable historian.  My differential diagnosis includes colitis, pancreatitis, gastritis, constipation  We did check some blood work to include a CBC CMP and a lipase which were unremarkable.  I will send the patient for a CT scan to evaluate his abdomen and he will be reassessed.  The patient had a CT scan that showed some areas of inflammation with no overt obstruction.  The patient has a large amount of stool in the rectum.  He has not had any vomiting and I feel that we may be able to try medication to help with his constipation.  He will be discharged with some stool softeners and MiraLAX to take twice a day.  The patient should follow-up with his primary care physician.  He has no other concerns at this time.      ____________________________________________   FINAL CLINICAL IMPRESSION(S) / ED DIAGNOSES  Final diagnoses:  Generalized abdominal pain  Constipation, unspecified constipation type     ED Discharge Orders        Ordered    docusate sodium (COLACE) 100 MG capsule  2 times daily      04/30/18 0644    polyethylene glycol (MIRALAX) packet  2 times daily     04/30/18 0644    ciprofloxacin (CIPRO) 500 MG tablet  2 times daily     04/30/18 0648    metroNIDAZOLE (FLAGYL) 500 MG tablet  2 times daily     04/30/18 1610       Note:  This document was prepared using Dragon voice recognition software and may include unintentional dictation errors.    Rebecka Apley, MD 04/30/18 [phone]

## 2018-04-30 NOTE — Discharge Instructions (Addendum)
Your CT scan shows some constipation.  As you are not vomiting I feel that this may be treated at home.  Please ensure that you are drinking lots of water and eating a diet that is high in fiber.  Please take the medication as prescribed and please follow-up with your primary care physician.

## 2018-04-30 NOTE — ED Notes (Signed)
Pt reports abd pain for 1 week.  Pt started on trazadone and has been constipated since starting this medicine.  No back pain .  No diff urinating.  No n/v.  Pt alert.

## 2018-04-30 NOTE — ED Triage Notes (Signed)
Pt brought in by ems with constipation and abd pain.  Pt was seen here last night with same sx.  No bm for 1 week per pt.

## 2018-05-01 ENCOUNTER — Encounter: Payer: Self-pay | Admitting: *Deleted

## 2018-05-01 ENCOUNTER — Other Ambulatory Visit: Payer: Self-pay

## 2018-05-01 ENCOUNTER — Emergency Department
Admission: EM | Admit: 2018-05-01 | Discharge: 2018-05-01 | Disposition: A | Discharge status: Home / Self Care | Payer: Medicare Other | Source: Home / Self Care | Attending: Emergency Medicine | Admitting: Emergency Medicine

## 2018-05-01 DIAGNOSIS — R109 Unspecified abdominal pain: Secondary | ICD-10-CM

## 2018-05-01 DIAGNOSIS — K5900 Constipation, unspecified: Secondary | ICD-10-CM

## 2018-05-01 DIAGNOSIS — K5641 Fecal impaction: Secondary | ICD-10-CM

## 2018-05-01 MED ORDER — LACTULOSE 10 G PO PACK: 10.0000 g | PACK | Freq: Three times a day (TID) | ORAL | 0 refills | Status: DC | PRN

## 2018-05-01 NOTE — ED Provider Notes (Signed)
[facility] Emergency Department Provider Note  ____________________________________________   First MD Initiated Contact with Patient 05/01/18 0155     (approximate)  I have reviewed the triage vital signs and the nursing notes.   HISTORY  Chief Complaint Constipation   HPI Peter Wilson is a 42 y.o. male who self presents to the emergency department with constipation and abdominal pain.  He was seen and evaluated in our emergency department yesterday where he had a CT scan consistent with fecal impaction.  He was discharged home with MiraLAX.  He says that the MiraLAX has not helped and he has not had a bowel movement in about 7 days.  He is passing flatus.  His abdominal pain is mild to moderate cramping diffuse seems to make it better or worse.  No nausea or vomiting.  Past Medical History:  Diagnosis Date  . Marijuana abuse   . Schizophrenia, paranoid (HCC)   . Tardive dyskinesia   . Tobacco abuse     Patient Active Problem List   Diagnosis Date Noted  . Tremor 04/24/2018  . Schizophrenia (HCC) 04/01/2018  . Noncompliance 04/10/2017  . Asthma 07/13/2016  . Schizoaffective disorder, bipolar type (HCC) 06/27/2016  . Antisocial traits 09/15/2015  . Tobacco use disorder 09/14/2015  . Cannabis use disorder, moderate, dependence (HCC) 09/14/2015    Past Surgical History:  Procedure Laterality Date  . NO PAST SURGERIES      Prior to Admission medications   Medication Sig Start Date End Date Taking? Authorizing Provider  benztropine (COGENTIN) 1 MG tablet Take 1 tablet (1 mg total) by mouth 2 (two) times daily. 04/24/18 06/23/18  Clapacs, [name], MD  ciprofloxacin (CIPRO) 500 MG tablet Take 1 tablet (500 mg total) by mouth 2 (two) times daily for 7 days. 04/30/18 05/07/18  Rebecka Apley, MD  cloZAPine (CLOZARIL) 200 MG tablet Take 1 tablet (200 mg total) by mouth at bedtime. 04/17/18   [name], Braulio Conte B, MD  divalproex (DEPAKOTE) 500 MG DR  tablet Take 1 tablet (500 mg total) by mouth every 8 (eight) hours. 04/17/18   [name], Braulio Conte B, MD  docusate sodium (COLACE) 100 MG capsule Take 1 capsule (100 mg total) by mouth 2 (two) times daily. 04/30/18 04/30/19  Rebecka Apley, MD  haloperidol decanoate (HALDOL DECANOATE) 100 MG/ML injection Inject 2.5 mLs (250 mg total) into the muscle every 21 ( twenty-one) days. Next dose on 4/30 04/17/18   [name], [name], MD  lithium carbonate 300 MG capsule Take 1 capsule (300 mg total) by mouth 3 (three) times daily with meals. 04/17/18   [name], Braulio Conte B, MD  metFORMIN (GLUCOPHAGE) 500 MG tablet Take 1 tablet (500 mg total) by mouth daily with breakfast. 04/18/18   [name], [name], MD  metroNIDAZOLE (FLAGYL) 500 MG tablet Take 1 tablet (500 mg total) by mouth 2 (two) times daily for 7 days. 04/30/18 05/07/18  Rebecka Apley, MD  mirtazapine (REMERON) 15 MG tablet Take 1 tablet (15 mg total) by mouth at bedtime. 04/17/18   [name], [name], MD  polyethylene glycol (MIRALAX) packet Take 17 g by mouth 2 (two) times daily. 04/30/18   Rebecka Apley, MD  SUMAtriptan (IMITREX) 50 MG tablet Take 1 tablet (50 mg total) by mouth every 2 (two) hours as needed for migraine or headache. May repeat in 2 hours if headache persists or recurs. 04/17/18   [name], Braulio Conte B, MD  traZODone (DESYREL) 100 MG tablet Take 1 tablet (100 mg  total) by mouth at bedtime. 04/17/18   [name], Ellin Goodie, MD    Allergies Penicillins  No family history on file.  Social History Social History   Tobacco Use  . Smoking status: Current Every Day Smoker    Packs/day: 2.00    Types: Cigarettes  . Smokeless tobacco: Never Used  Substance Use Topics  . Alcohol use: Yes    Alcohol/week: 0.0 oz  . Drug use: Yes    Types: Marijuana    Review of Systems Constitutional: No fever/chills ENT: No sore throat. Cardiovascular: Denies chest pain. Respiratory: Denies shortness of  breath. Gastrointestinal: Positive for abdominal pain and constipation Musculoskeletal: Negative for back pain. Neurological: Negative for headaches   ____________________________________________   PHYSICAL EXAM:  VITAL SIGNS: ED Triage Vitals [05/01/18 0000]  Enc Vitals Group     BP (!) 134/115     Pulse Rate (!) 120     Resp 18     Temp 98 F (36.7 C)     Temp Source Oral     SpO2 99 %     Weight 170 lb (77.1 kg)     Height 6' (1.829 m)     Head Circumference      Peak Flow      Pain Score 10     Pain Loc      Pain Edu?      Excl. in GC?     Constitutional: Strange affect although no acute distress pleasant cooperative Head: Atraumatic. Nose: No congestion/rhinnorhea. Mouth/Throat: No trismus Neck: No stridor.   Cardiovascular: Tachycardic regular rhythm Respiratory: Normal respiratory effort.  No retractions. Gastrointestinal: Soft nondistended nontender no rebound or guarding or peritoneal Neurologic:  Normal speech and language. No gross focal neurologic deficits are appreciated.  Skin:  Skin is warm, dry and intact. No rash noted.    ____________________________________________  LABS (all labs ordered are listed, but only abnormal results are displayed)  Labs Reviewed - No data to display   __________________________________________  EKG   ____________________________________________  RADIOLOGY   ____________________________________________   DIFFERENTIAL includes but not limited to  Fecal impaction, dehydration, bowel obstruction   PROCEDURES  Procedure(s) performed: no  Procedures  Critical Care performed: no  Observation: no ____________________________________________   INITIAL IMPRESSION / ASSESSMENT AND PLAN / ED COURSE  Pertinent labs & imaging results that were available during my care of the patient were reviewed by me and considered in my medical decision making (see chart for details).       ----------------------------------------- 2:25 AM on 05/01/2018 -----------------------------------------  I offered the patient disimpaction to help with his fecal impaction however he declined.  I then offered him IV sedation to help with his symptoms and he once again declined stating he would prefer to take stronger medications.  I will prescribe him lactulose to refer him to GI as an outpatient.  ____________________________________________   FINAL CLINICAL IMPRESSION(S) / ED DIAGNOSES  Final diagnoses:  Fecal impaction (HCC)      NEW MEDICATIONS STARTED DURING THIS VISIT:  New Prescriptions   No medications on file     Note:  This document was prepared using Dragon voice recognition software and may include unintentional dictation errors.      Merrily Brittle, MD 05/01/18 2147

## 2018-05-01 NOTE — Discharge Instructions (Signed)
It was a pleasure to take care of you today, and thank you for coming to our emergency department.  If you have any questions or concerns before leaving please ask the nurse to grab me and I'm more than happy to go through your aftercare instructions again.  If you were prescribed any opioid pain medication today such as Norco, Vicodin, Percocet, morphine, hydrocodone, or oxycodone please make sure you do not drive when you are taking this medication as it can alter your ability to drive safely.  If you have any concerns once you are home that you are not improving or are in fact getting worse before you can make it to your follow-up appointment, please do not hesitate to call [phone] and come back for further evaluation.  Merrily Brittle, MD  Results for orders placed or performed during the hospital encounter of 04/30/18  Lipase, blood  Result Value Ref Range   Lipase 35 11 - 51 U/L  Comprehensive metabolic panel  Result Value Ref Range   Sodium 139 135 - 145 mmol/L   Potassium 3.7 3.5 - 5.1 mmol/L   Chloride 104 101 - 111 mmol/L   CO2 27 22 - 32 mmol/L   Glucose, Bld 108 (H) 65 - 99 mg/dL   BUN 8 6 - 20 mg/dL   Creatinine, Ser 4.69 0.61 - 1.24 mg/dL   Calcium 9.1 8.9 - 62.9 mg/dL   Total Protein 7.8 6.5 - 8.1 g/dL   Albumin 4.0 3.5 - 5.0 g/dL   AST 29 15 - 41 U/L   ALT 9 (L) 17 - 63 U/L   Alkaline Phosphatase 51 38 - 126 U/L   Total Bilirubin 0.4 0.3 - 1.2 mg/dL   GFR calc non Af Amer >60 >60 mL/min   GFR calc Af Amer >60 >60 mL/min   Anion gap 8 5 - 15  CBC  Result Value Ref Range   WBC 5.5 3.8 - 10.6 K/uL   RBC 3.93 (L) 4.40 - 5.90 MIL/uL   Hemoglobin 11.6 (L) 13.0 - 18.0 g/dL   HCT 52.8 (L) 41.3 - 24.4 %   MCV 89.7 80.0 - 100.0 fL   MCH 29.5 26.0 - 34.0 pg   MCHC 32.9 32.0 - 36.0 g/dL   RDW 01.0 (H) 27.2 - 53.6 %   Platelets 382 150 - 440 K/uL   Dg Abdomen 1 View  Result Date: 04/30/2018 CLINICAL DATA:  42 y/o M; generalized abdominal pain with no bowel movement for 1  week. EXAM: ABDOMEN - 1 VIEW COMPARISON:  None. FINDINGS: Nonobstructive bowel gas pattern. Large volume of stool throughout the colon. Bones are unremarkable. No radiopaque urinary stone disease identified. IMPRESSION: Nonobstructive bowel gas pattern. Large volume of stool throughout the colon compatible with constipation. Electronically Signed   By: Mitzi Hansen M.D.   On: 04/30/2018 01:43   Ct Abdomen Pelvis W Contrast  Result Date: 04/30/2018 CLINICAL DATA:  42 year old male with abdominal pain. EXAM: CT ABDOMEN AND PELVIS WITH CONTRAST TECHNIQUE: Multidetector CT imaging of the abdomen and pelvis was performed using the standard protocol following bolus administration of intravenous contrast. CONTRAST:  75mL ISOVUE-370 IOPAMIDOL (ISOVUE-370) INJECTION 76% COMPARISON:  Abdominal radiograph dated 04/30/2018 FINDINGS: Lower chest: The visualized lung bases are clear. No intra-abdominal free air or free fluid. Hepatobiliary: No focal liver abnormality is seen. No gallstones, gallbladder wall thickening, or biliary dilatation. Pancreas: Unremarkable. No pancreatic ductal dilatation or surrounding inflammatory changes. Spleen: Normal in size without focal abnormality. Adrenals/Urinary Tract: Adrenal  glands are unremarkable. Kidneys are normal, without renal calculi, focal lesion, or hydronephrosis. Bladder is unremarkable. Stomach/Bowel: There is large amount of stool in the rectal vault compatible with fecal impaction. There is focal area of narrowing of the sigmoid colon (series 2, image 56 and coronal series 6, image 37) which may be related to underdistention or represent an area of stricture. There is redundancy of the sigmoid colon in the left hemiabdomen with narrowed segments. There is no evidence of obstruction or twisting. There is air distention of the colon proximal to these narrow segments. The transverse colon measures approximately 8 cm in diameter. There is mild stranding of the upper  omentum in the left upper quadrant (series 2 image 27) with trace fluid. No dilatation of the small bowel or evidence of obstruction. The appendix is unremarkable. Vascular/Lymphatic: No significant vascular findings are present. No enlarged abdominal or pelvic lymph nodes. Reproductive: The prostate and seminal vesicles are grossly unremarkable. Other: Small fat containing umbilical hernia. Musculoskeletal: No acute or significant osseous findings. IMPRESSION: 1. Redundancy of the sigmoid colon in the left hemiabdomen with narrowed segments which may be secondary to chronic inflammation or scarring/stricture. There is air distention of the colon proximal to the sigmoid with mild infiltration/inflammation of the omentum in the left upper abdomen. No definite evidence of obstruction or twisting. A colonic pseudo-obstruction is not entirely excluded. Clinical correlation is recommended. There is no pneumatosis or portal venous gas. 2. Large amount of stool in the rectal vault consistent with fecal impaction. Electronically Signed   By: Elgie Collard M.D.   On: 04/30/2018 06:01

## 2018-05-28 ENCOUNTER — Emergency Department
Admission: EM | Admit: 2018-05-28 | Discharge: 2018-05-28 | Disposition: A | Discharge status: Home / Self Care | Payer: Medicare Other | Attending: Emergency Medicine | Admitting: Emergency Medicine

## 2018-05-28 ENCOUNTER — Emergency Department: Payer: Medicare Other

## 2018-05-28 ENCOUNTER — Other Ambulatory Visit: Payer: Self-pay

## 2018-05-28 ENCOUNTER — Encounter: Payer: Self-pay | Admitting: Emergency Medicine

## 2018-05-28 DIAGNOSIS — Y998 Other external cause status: Secondary | ICD-10-CM | POA: Insufficient documentation

## 2018-05-28 DIAGNOSIS — Z7984 Long term (current) use of oral hypoglycemic drugs: Secondary | ICD-10-CM | POA: Insufficient documentation

## 2018-05-28 DIAGNOSIS — R51 Headache: Secondary | ICD-10-CM | POA: Diagnosis not present

## 2018-05-28 DIAGNOSIS — Z79899 Other long term (current) drug therapy: Secondary | ICD-10-CM | POA: Insufficient documentation

## 2018-05-28 DIAGNOSIS — Y929 Unspecified place or not applicable: Secondary | ICD-10-CM | POA: Insufficient documentation

## 2018-05-28 DIAGNOSIS — G8929 Other chronic pain: Secondary | ICD-10-CM

## 2018-05-28 DIAGNOSIS — F1210 Cannabis abuse, uncomplicated: Secondary | ICD-10-CM | POA: Diagnosis not present

## 2018-05-28 DIAGNOSIS — J45909 Unspecified asthma, uncomplicated: Secondary | ICD-10-CM | POA: Insufficient documentation

## 2018-05-28 DIAGNOSIS — F17210 Nicotine dependence, cigarettes, uncomplicated: Secondary | ICD-10-CM | POA: Diagnosis not present

## 2018-05-28 DIAGNOSIS — R6884 Jaw pain: Secondary | ICD-10-CM | POA: Diagnosis not present

## 2018-05-28 DIAGNOSIS — Y9389 Activity, other specified: Secondary | ICD-10-CM | POA: Diagnosis not present

## 2018-05-28 MED ORDER — MELOXICAM 15 MG PO TABS: 15.0000 mg | ORAL_TABLET | Freq: Every day | ORAL | 0 refills | Status: DC

## 2018-05-28 MED ORDER — KETOROLAC TROMETHAMINE 30 MG/ML IJ SOLN
30.0000 mg | Freq: Once | INTRAMUSCULAR | Status: AC
  Administered 2018-05-28: 30 mg via INTRAMUSCULAR
  Filled 2018-05-28: qty 1

## 2018-05-28 MED ORDER — OXYCODONE-ACETAMINOPHEN 5-325 MG PO TABS
1.0000 | ORAL_TABLET | Freq: Once | ORAL | Status: AC
  Administered 2018-05-28: 1 via ORAL
  Filled 2018-05-28: qty 1

## 2018-05-28 MED ORDER — DIPHENHYDRAMINE HCL 50 MG/ML IJ SOLN
50.0000 mg | Freq: Once | INTRAMUSCULAR | Status: AC
  Administered 2018-05-28: 50 mg via INTRAMUSCULAR
  Filled 2018-05-28: qty 1

## 2018-05-28 MED ORDER — PROMETHAZINE HCL 25 MG/ML IJ SOLN
25.0000 mg | Freq: Once | INTRAMUSCULAR | Status: AC
  Administered 2018-05-28: 25 mg via INTRAMUSCULAR
  Filled 2018-05-28: qty 1
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
## 2018-05-28 NOTE — ED Notes (Addendum)
Patient left after 20 min med hold with friend
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
## 2018-05-28 NOTE — ED Triage Notes (Signed)
Per EMS report, patient has had a headache for 1 week. No other symptoms reported. VSS per EMS report. Patient appears relaxed, sitting in wheelchair with headphones on and looking at his phone.

## 2019-01-03 ENCOUNTER — Emergency Department
Admission: EM | Admit: 2019-01-03 | Discharge: 2019-01-04 | Disposition: A | Discharge status: Home / Self Care | Payer: Medicare Other | Attending: Emergency Medicine | Admitting: Emergency Medicine

## 2019-01-03 DIAGNOSIS — Z79899 Other long term (current) drug therapy: Secondary | ICD-10-CM | POA: Insufficient documentation

## 2019-01-03 DIAGNOSIS — F209 Schizophrenia, unspecified: Secondary | ICD-10-CM | POA: Diagnosis not present

## 2019-01-03 DIAGNOSIS — R4182 Altered mental status, unspecified: Secondary | ICD-10-CM | POA: Diagnosis present
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
## 2019-01-04 ENCOUNTER — Encounter: Payer: Self-pay | Admitting: Emergency Medicine

## 2019-01-04 MED ORDER — HALOPERIDOL DECANOATE 100 MG/ML IM SOLN
100.0000 mg | Freq: Once | INTRAMUSCULAR | Status: AC
  Administered 2019-01-04: 100 mg via INTRAMUSCULAR
  Filled 2019-01-04: qty 1
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
## 2019-01-04 NOTE — ED Notes (Signed)
Pt. Going home with sister. 

## 2019-03-10 ENCOUNTER — Encounter: Payer: Self-pay | Admitting: Emergency Medicine

## 2019-03-10 ENCOUNTER — Other Ambulatory Visit: Payer: Self-pay

## 2019-03-10 ENCOUNTER — Emergency Department: Payer: Medicare Other

## 2019-03-10 ENCOUNTER — Emergency Department
Admission: EM | Admit: 2019-03-10 | Discharge: 2019-03-10 | Disposition: A | Discharge status: Home / Self Care | Payer: Medicare Other | Attending: Emergency Medicine | Admitting: Emergency Medicine

## 2019-03-10 DIAGNOSIS — Z79899 Other long term (current) drug therapy: Secondary | ICD-10-CM | POA: Diagnosis not present

## 2019-03-10 DIAGNOSIS — M79641 Pain in right hand: Secondary | ICD-10-CM | POA: Diagnosis not present

## 2019-03-10 DIAGNOSIS — J45909 Unspecified asthma, uncomplicated: Secondary | ICD-10-CM | POA: Insufficient documentation

## 2019-03-10 DIAGNOSIS — F17210 Nicotine dependence, cigarettes, uncomplicated: Secondary | ICD-10-CM | POA: Diagnosis not present

## 2019-03-10 MED ORDER — NAPROXEN 500 MG PO TABS
500.0000 mg | ORAL_TABLET | Freq: Once | ORAL | Status: AC
  Administered 2019-03-10: 500 mg via ORAL
  Filled 2019-03-10: qty 1

## 2019-03-10 MED ORDER — NAPROXEN 500 MG PO TABS: 500.0000 mg | ORAL_TABLET | Freq: Two times a day (BID) | ORAL | Status: DC

## 2019-03-10 NOTE — ED Triage Notes (Signed)
Pt states he broke his second digit on his right hand about 8 months ago, states it is aching now. NAD.

## 2019-03-10 NOTE — ED Provider Notes (Signed)
[facility] Emergency Department Provider Note   ____________________________________________   First MD Initiated Contact with Patient 03/10/19 1142     (approximate)  I have reviewed the triage vital signs and the nursing notes.   HISTORY  Chief Complaint Hand Pain    HPI Peter Wilson is a 43 y.o. male patient complain of pain to the right hand secondary to to history is a fracture in the second meta carpal.  First fracture occurred in 2017.  Second fracture occurred 8 months ago.  Patient state no recent injury but awakened this morning with pain and decreased range of motion of flexion of second digit.  Patient rates his pain as 8/10.  Patient described the pain is "achy".  No palliative measure for complaint.  Patient denies loss sensation.  Patient is right-hand dominant.         Past Medical History:  Diagnosis Date  . Marijuana abuse   . Schizophrenia, paranoid (HCC)   . Tardive dyskinesia   . Tobacco abuse     Patient Active Problem List   Diagnosis Date Noted  . Tremor 04/24/2018  . Schizophrenia (HCC) 04/01/2018  . Noncompliance 04/10/2017  . Asthma 07/13/2016  . Schizoaffective disorder, bipolar type (HCC) 06/27/2016  . Antisocial traits 09/15/2015  . Tobacco use disorder 09/14/2015  . Cannabis use disorder, moderate, dependence (HCC) 09/14/2015    Past Surgical History:  Procedure Laterality Date  . NO PAST SURGERIES      Prior to Admission medications   Medication Sig Start Date End Date Taking? Authorizing Provider  benztropine (COGENTIN) 1 MG tablet Take 1 tablet (1 mg total) by mouth 2 (two) times daily. 04/24/18 06/23/18  Clapacs, [name], MD  cloZAPine (CLOZARIL) 200 MG tablet Take 1 tablet (200 mg total) by mouth at bedtime. 04/17/18   [name], Braulio Conte B, MD  divalproex (DEPAKOTE) 500 MG DR tablet Take 1 tablet (500 mg total) by mouth every 8 (eight) hours. 04/17/18   [name], Braulio Conte B, MD  docusate sodium (COLACE)  100 MG capsule Take 1 capsule (100 mg total) by mouth 2 (two) times daily. 04/30/18 04/30/19  Rebecka Apley, MD  haloperidol decanoate (HALDOL DECANOATE) 100 MG/ML injection Inject 2.5 mLs (250 mg total) into the muscle every 21 ( twenty-one) days. Next dose on 4/30 04/17/18   [name], [name], MD  lactulose (CEPHULAC) 10 g packet Take 1 packet (10 g total) by mouth 3 (three) times daily as needed (constipation). 05/01/18   Merrily Brittle, MD  lithium carbonate 300 MG capsule Take 1 capsule (300 mg total) by mouth 3 (three) times daily with meals. 04/17/18   [name], Braulio Conte B, MD  meloxicam (MOBIC) 15 MG tablet Take 1 tablet (15 mg total) by mouth daily. 05/28/18   Cuthriell, Delorise Royals, PA-C  metFORMIN (GLUCOPHAGE) 500 MG tablet Take 1 tablet (500 mg total) by mouth daily with breakfast. 04/18/18   [name], [name], MD  mirtazapine (REMERON) 15 MG tablet Take 1 tablet (15 mg total) by mouth at bedtime. 04/17/18   [name], Braulio Conte B, MD  naproxen (NAPROSYN) 500 MG tablet Take 1 tablet (500 mg total) by mouth 2 (two) times daily with a meal. 03/10/19   Joni Reining, PA-C  polyethylene glycol [facility]) packet Take 17 g by mouth 2 (two) times daily. 04/30/18   Rebecka Apley, MD  SUMAtriptan (IMITREX) 50 MG tablet Take 1 tablet (50 mg total) by mouth every 2 (two) hours as needed for migraine or headache.  May repeat in 2 hours if headache persists or recurs. 04/17/18   [name], Braulio Conte B, MD  traZODone (DESYREL) 100 MG tablet Take 1 tablet (100 mg total) by mouth at bedtime. 04/17/18   [name], Ellin Goodie, MD    Allergies Penicillins  No family history on file.  Social History Social History   Tobacco Use  . Smoking status: Current Every Day Smoker    Packs/day: 2.00    Types: Cigarettes  . Smokeless tobacco: Never Used  Substance Use Topics  . Alcohol use: Yes    Alcohol/week: 0.0 standard drinks  . Drug use: Yes    Types: Marijuana    Review of Systems  Constitutional: No fever/chills Eyes: No visual changes. ENT: No sore throat. Cardiovascular: Denies chest pain. Respiratory: Denies shortness of breath. Gastrointestinal: No abdominal pain.  No nausea, no vomiting.  No diarrhea.  No constipation. Genitourinary: Negative for dysuria. Musculoskeletal: Right hand pain.   Skin: Negative for rash. Neurological: Negative for headaches, focal weakness or numbness. Psychiatric:  Cannabis dependency and schizophrenia. Allergic/Immunilogical: Penicillin. ____________________________________________   PHYSICAL EXAM:  VITAL SIGNS: ED Triage Vitals  Enc Vitals Group     BP 03/10/19 1031 112/74     Pulse Rate 03/10/19 1031 69     Resp 03/10/19 1031 18     Temp 03/10/19 1031 97.8 F (36.6 C)     Temp Source 03/10/19 1031 Oral     SpO2 03/10/19 1031 98 %     Weight 03/10/19 1028 180 lb (81.6 kg)     Height 03/10/19 1028 6' (1.829 m)     Head Circumference --      Peak Flow --      Pain Score 03/10/19 1027 8     Pain Loc --      Pain Edu? --      Excl. in GC? --     Constitutional: Alert and oriented. Well appearing and in no acute distress. Cardiovascular: Normal rate, regular rhythm. Grossly normal heart sounds.  Good peripheral circulation. Respiratory: Normal respiratory effort.  No retractions. Lungs CTAB. Musculoskeletal: Deformity to the second metacarpal head from previous fracture.  No obvious edema or erythema.  Neurologic:  Normal speech and language. No gross focal neurologic deficits are appreciated. No gait instability. Skin:  Skin is warm, dry and intact. No rash noted. Psychiatric: Mood and affect are normal. Speech and behavior are normal.  ____________________________________________   LABS (all labs ordered are listed, but only abnormal results are displayed)  Labs Reviewed - No data to display ____________________________________________  EKG   ____________________________________________  RADIOLOGY  ED  MD interpretation:    Official radiology report(s): Dg Hand Complete Right  Result Date: 03/10/2019 CLINICAL DATA:  unable to bend his pointer finger or move it at all without pain. Pt finger is currently sticking straight up and any adjustments causes pt to have pain. First oblique image was obtained with pt finger "as is" and second image pointer finger was isolated for better visualization. Pt denies any recent injury, but states that he has broken the finger 2 times before EXAM: RIGHT HAND - COMPLETE 3+ VIEW COMPARISON:  01/28/2016 FINDINGS: There is no evidence of fracture or dislocation. Stable old fracture deformity of the second metacarpal. No significant osseous degenerative change. There is no evidence of arthropathy or other focal bone abnormality. Soft tissues are unremarkable. IMPRESSION: Negative. Electronically Signed   By: Corlis Leak M.D.   On: 03/10/2019 12:11    ____________________________________________  PROCEDURES  Procedure(s) performed (including Critical Care):  Procedures   ____________________________________________   INITIAL IMPRESSION / ASSESSMENT AND PLAN / ED COURSE  As part of my medical decision making, I reviewed the following data within the electronic MEDICAL RECORD NUMBER         Nontraumatic right hand pain.  Discussed x-ray findings showing a stable fracture of the second metacarpal head.  Patient given discharge care instruction prescription for an anti-inflammatory medication.  Patient advised follow-up orthopedic for definitive evaluation and treatment.   ____________________________________________   FINAL CLINICAL IMPRESSION(S) / ED DIAGNOSES  Final diagnoses:  Right hand pain     ED Discharge Orders         Ordered    naproxen (NAPROSYN) 500 MG tablet  2 times daily with meals     03/10/19 1229           Note:  This document was prepared using Dragon voice recognition software and may include unintentional dictation errors.     Joni Reining, PA-C 03/10/19 1237    Nita Sickle, MD 03/11/19 1525

## 2019-03-10 NOTE — ED Notes (Signed)

## 2019-03-10 NOTE — Discharge Instructions (Signed)
Advised to follow orthopedics for definitive evaluation and treatment.

## 2019-10-12 ENCOUNTER — Emergency Department (HOSPITAL_COMMUNITY)
Admission: EM | Admit: 2019-10-12 | Discharge: 2019-10-13 | Disposition: A | Discharge status: Home / Self Care | Payer: Medicare Other | Attending: Emergency Medicine | Admitting: Emergency Medicine

## 2019-10-12 ENCOUNTER — Other Ambulatory Visit: Payer: Self-pay

## 2019-10-12 ENCOUNTER — Encounter (HOSPITAL_COMMUNITY): Payer: Self-pay | Admitting: Emergency Medicine

## 2019-10-12 DIAGNOSIS — Z046 Encounter for general psychiatric examination, requested by authority: Secondary | ICD-10-CM | POA: Diagnosis not present

## 2019-10-12 DIAGNOSIS — Z7984 Long term (current) use of oral hypoglycemic drugs: Secondary | ICD-10-CM | POA: Insufficient documentation

## 2019-10-12 DIAGNOSIS — Z20828 Contact with and (suspected) exposure to other viral communicable diseases: Secondary | ICD-10-CM | POA: Insufficient documentation

## 2019-10-12 DIAGNOSIS — F1220 Cannabis dependence, uncomplicated: Secondary | ICD-10-CM | POA: Diagnosis present

## 2019-10-12 DIAGNOSIS — R4689 Other symptoms and signs involving appearance and behavior: Secondary | ICD-10-CM | POA: Diagnosis present

## 2019-10-12 DIAGNOSIS — J45909 Unspecified asthma, uncomplicated: Secondary | ICD-10-CM | POA: Insufficient documentation

## 2019-10-12 DIAGNOSIS — Z91199 Patient's noncompliance with other medical treatment and regimen due to unspecified reason: Secondary | ICD-10-CM

## 2019-10-12 DIAGNOSIS — F209 Schizophrenia, unspecified: Secondary | ICD-10-CM | POA: Diagnosis not present

## 2019-10-12 DIAGNOSIS — F17210 Nicotine dependence, cigarettes, uncomplicated: Secondary | ICD-10-CM | POA: Diagnosis not present

## 2019-10-12 DIAGNOSIS — Z9119 Patient's noncompliance with other medical treatment and regimen: Secondary | ICD-10-CM

## 2019-10-12 DIAGNOSIS — F1994 Other psychoactive substance use, unspecified with psychoactive substance-induced mood disorder: Secondary | ICD-10-CM

## 2019-10-12 DIAGNOSIS — Z79899 Other long term (current) drug therapy: Secondary | ICD-10-CM | POA: Insufficient documentation

## 2019-10-12 LAB — RAPID URINE DRUG SCREEN, HOSP PERFORMED
Amphetamines: NOT DETECTED
Barbiturates: NOT DETECTED
Benzodiazepines: NOT DETECTED
Cocaine: NOT DETECTED
Opiates: NOT DETECTED
Tetrahydrocannabinol: NOT DETECTED

## 2019-10-12 LAB — COMPREHENSIVE METABOLIC PANEL
ALT: 35 U/L (ref 0–44)
AST: 58 U/L — ABNORMAL HIGH (ref 15–41)
Albumin: 4 g/dL (ref 3.5–5.0)
Alkaline Phosphatase: 69 U/L (ref 38–126)
Anion gap: 10 (ref 5–15)
BUN: 12 mg/dL (ref 6–20)
CO2: 29 mmol/L (ref 22–32)
Calcium: 9.3 mg/dL (ref 8.9–10.3)
Chloride: 101 mmol/L (ref 98–111)
Creatinine, Ser: 0.95 mg/dL (ref 0.61–1.24)
GFR calc Af Amer: >60 mL/min (ref >60)
GFR calc non Af Amer: >60 mL/min (ref >60)
Glucose, Bld: 102 mg/dL — ABNORMAL HIGH (ref 70–99)
Potassium: 4.2 mmol/L (ref 3.5–5.1)
Sodium: 140 mmol/L (ref 135–145)
Total Bilirubin: 0.4 mg/dL (ref 0.3–1.2)
Total Protein: 8.1 g/dL (ref 6.5–8.1)

## 2019-10-12 LAB — CBC WITH DIFFERENTIAL/PLATELET
Abs Immature Granulocytes: 0.02 10*3/uL (ref 0.00–0.07)
Basophils Absolute: 0.1 10*3/uL (ref 0.0–0.1)
Basophils Relative: 2 %
Eosinophils Absolute: 0.2 10*3/uL (ref 0.0–0.5)
Eosinophils Relative: 3 %
HCT: 36.5 % — ABNORMAL LOW (ref 39.0–52.0)
Hemoglobin: 10.9 g/dL — ABNORMAL LOW (ref 13.0–17.0)
Immature Granulocytes: 0 %
Lymphocytes Relative: 31 %
Lymphs Abs: 2.3 10*3/uL (ref 0.7–4.0)
MCH: 28.3 pg (ref 26.0–34.0)
MCHC: 29.9 g/dL — ABNORMAL LOW (ref 30.0–36.0)
MCV: 94.8 fL (ref 80.0–100.0)
Monocytes Absolute: 0.8 10*3/uL (ref 0.1–1.0)
Monocytes Relative: 11 %
Neutro Abs: 4 10*3/uL (ref 1.7–7.7)
Neutrophils Relative %: 53 %
Platelets: 406 10*3/uL — ABNORMAL HIGH (ref 150–400)
RBC: 3.85 MIL/uL — ABNORMAL LOW (ref 4.22–5.81)
RDW: 13.9 % (ref 11.5–15.5)
WBC: 7.5 10*3/uL (ref 4.0–10.5)
nRBC: 0 % (ref 0.0–0.2)

## 2019-10-12 LAB — SARS CORONAVIRUS 2 BY RT PCR (HOSPITAL ORDER, PERFORMED IN ~~LOC~~ HOSPITAL LAB): SARS Coronavirus 2: NEGATIVE

## 2019-10-12 LAB — ETHANOL: Alcohol, Ethyl (B): <10 mg/dL (ref <10)

## 2019-10-12 MED ORDER — OLANZAPINE 10 MG PO TBDP: 10.0000 mg | ORAL_TABLET | Freq: Two times a day (BID) | ORAL | Status: DC | PRN

## 2019-10-12 MED ORDER — HALOPERIDOL LACTATE 5 MG/ML IJ SOLN
5.0000 mg | Freq: Once | INTRAMUSCULAR | Status: AC
  Administered 2019-10-12: 5 mg via INTRAMUSCULAR
  Filled 2019-10-12: qty 1

## 2019-10-12 MED ORDER — ZIPRASIDONE MESYLATE 20 MG IM SOLR: 20.0000 mg | Freq: Two times a day (BID) | INTRAMUSCULAR | Status: DC | PRN

## 2019-10-12 MED ORDER — LORAZEPAM 1 MG PO TABS: 1.0000 mg | ORAL_TABLET | ORAL | Status: DC | PRN

## 2019-10-12 MED ORDER — LORAZEPAM 2 MG/ML IJ SOLN
2.0000 mg | Freq: Once | INTRAMUSCULAR | Status: AC
  Administered 2019-10-12: 2 mg via INTRAMUSCULAR
  Filled 2019-10-12: qty 1

## 2019-10-12 NOTE — ED Notes (Signed)
Spoke with NP [employer] number [phone] Will reevaluate in morning

## 2019-10-12 NOTE — Progress Notes (Signed)
Peter Wilson is a 43 y.o. male who presented to [facility] under IVC (petitioner is Comptroller, a representative of Pt's ACT Team) due to non-compliance with medication,  paranoia, and aggression.  Pt is followed by [facility] Team (phone number [phone]).  Reviewed chart/PTA medications. Patient has been prescribed several antipsychotics in the past. His current medication regimen is unclear. ACT team will need to be contacted on 10/13/2019 to review medications.   Contacted patient's nurse, Jenny Reichmann. Patient received haldol and ativan earlier and is currently resting quietly.   Placed order for Geodon agitation protocol. QTC 395 on 10/12/2019.

## 2019-10-12 NOTE — ED Provider Notes (Signed)
[facility]-EMERGENCY DEPT Provider Note   CSN: [id number] Arrival date & time: 10/12/19  1541     History   Chief Complaint Chief Complaint  Patient presents with   IVC   Aggressive Behavior    HPI Peter Wilson is a 43 y.o. male.     The history is provided by the patient and medical records. No language interpreter was used.  Mental Health Problem Presenting symptoms: aggressive behavior and agitation   Presenting symptoms: no homicidal ideas and no suicidal thoughts   Patient accompanied by: ACT team IVC patient. Degree of incapacity (severity):  Severe Onset quality:  Gradual Duration:  4 days Timing:  Constant Progression:  Worsening Chronicity:  Recurrent Relieved by:  Nothing Worsened by:  Nothing Associated symptoms: irritability   Associated symptoms: no abdominal pain, no appetite change, no chest pain, no fatigue and no headaches   Risk factors: hx of mental illness     Past Medical History:  Diagnosis Date   Marijuana abuse    Schizophrenia, paranoid (HCC)    Tardive dyskinesia    Tobacco abuse     Patient Active Problem List   Diagnosis Date Noted   Tremor 04/24/2018   Schizophrenia (HCC) 04/01/2018   Noncompliance 04/10/2017   Asthma 07/13/2016   Schizoaffective disorder, bipolar type (HCC) 06/27/2016   Antisocial traits 09/15/2015   Tobacco use disorder 09/14/2015   Cannabis use disorder, moderate, dependence (HCC) 09/14/2015    Past Surgical History:  Procedure Laterality Date   NO PAST SURGERIES          Home Medications    Prior to Admission medications   Medication Sig Start Date End Date Taking? Authorizing Provider  benztropine (COGENTIN) 1 MG tablet Take 1 tablet (1 mg total) by mouth 2 (two) times daily. 04/24/18 06/23/18  Clapacs, Jackquline DenmarkJohn T, MD  cloZAPine (CLOZARIL) 200 MG tablet Take 1 tablet (200 mg total) by mouth at bedtime. 04/17/18   [name], Braulio ConteJolanta B, MD  divalproex (DEPAKOTE) 500  MG DR tablet Take 1 tablet (500 mg total) by mouth every 8 (eight) hours. 04/17/18   [name], [name], MD  haloperidol decanoate (HALDOL DECANOATE) 100 MG/ML injection Inject 2.5 mLs (250 mg total) into the muscle every 21 ( twenty-one) days. Next dose on 4/30 04/17/18   [name], [name], MD  lactulose (CEPHULAC) 10 g packet Take 1 packet (10 g total) by mouth 3 (three) times daily as needed (constipation). 05/01/18   Merrily Brittleifenbark, Neil, MD  lithium carbonate 300 MG capsule Take 1 capsule (300 mg total) by mouth 3 (three) times daily with meals. 04/17/18   [name], Braulio ConteJolanta B, MD  meloxicam (MOBIC) 15 MG tablet Take 1 tablet (15 mg total) by mouth daily. 05/28/18   Cuthriell, Delorise RoyalsJonathan D, PA-C  metFORMIN (GLUCOPHAGE) 500 MG tablet Take 1 tablet (500 mg total) by mouth daily with breakfast. 04/18/18   [name], [name], MD  mirtazapine (REMERON) 15 MG tablet Take 1 tablet (15 mg total) by mouth at bedtime. 04/17/18   [name], Braulio ConteJolanta B, MD  naproxen (NAPROSYN) 500 MG tablet Take 1 tablet (500 mg total) by mouth 2 (two) times daily with a meal. 03/10/19   Joni ReiningSmith, Ronald K, PA-C  polyethylene glycol [facility](MIRALAX) packet Take 17 g by mouth 2 (two) times daily. 04/30/18   Rebecka ApleyWebster, Allison P, MD  SUMAtriptan (IMITREX) 50 MG tablet Take 1 tablet (50 mg total) by mouth every 2 (two) hours as needed for migraine or headache. May repeat in 2  hours if headache persists or recurs. 04/17/18   [name], Herma Ard B, MD  traZODone (DESYREL) 100 MG tablet Take 1 tablet (100 mg total) by mouth at bedtime. 04/17/18   Clovis Fredrickson, MD    Family History No family history on file.  Social History Social History   Tobacco Use   Smoking status: Current Every Day Smoker    Packs/day: 2.00    Types: Cigarettes   Smokeless tobacco: Never Used  Substance Use Topics   Alcohol use: Yes    Alcohol/week: 0.0 standard drinks   Drug use: Yes    Types: Marijuana     Allergies   Penicillins   Review of  Systems Review of Systems  Constitutional: Positive for irritability. Negative for appetite change, chills, diaphoresis and fatigue.  Eyes: Negative for visual disturbance.  Respiratory: Negative for cough, chest tightness and shortness of breath.   Cardiovascular: Negative for chest pain.  Gastrointestinal: Negative for abdominal pain, constipation, diarrhea, nausea and vomiting.  Genitourinary: Negative for dysuria.  Musculoskeletal: Negative for back pain.  Neurological: Negative for headaches.  Psychiatric/Behavioral: Positive for agitation. Negative for homicidal ideas and suicidal ideas.  All other systems reviewed and are negative.    Physical Exam Updated Vital Signs BP 120/88 (BP Location: Right Arm)    Pulse 99    Temp 98 F (36.7 C) (Oral)    Resp 19    SpO2 100%   Physical Exam Vitals signs and nursing note reviewed.  Constitutional:      General: He is not in acute distress.    Appearance: Normal appearance. He is well-developed. He is not ill-appearing, toxic-appearing or diaphoretic.  HENT:     Head: Normocephalic and atraumatic.     Nose: No congestion or rhinorrhea.  Eyes:     Conjunctiva/sclera: Conjunctivae normal.  Neck:     Musculoskeletal: Neck supple.  Cardiovascular:     Rate and Rhythm: Normal rate and regular rhythm.     Heart sounds: No murmur.  Pulmonary:     Effort: Pulmonary effort is normal. No respiratory distress.     Breath sounds: Normal breath sounds.  Abdominal:     Palpations: Abdomen is soft.     Tenderness: There is no abdominal tenderness.  Musculoskeletal:        General: No tenderness.     Right lower leg: No edema.     Left lower leg: No edema.  Skin:    General: Skin is warm and dry.  Neurological:     General: No focal deficit present.     Mental Status: He is alert.  Psychiatric:        Mood and Affect: Mood is anxious. Affect is angry.        Behavior: Behavior is agitated and aggressive.        Thought Content:  Thought content does not include homicidal or suicidal ideation.      ED Treatments / Results  Labs (all labs ordered are listed, but only abnormal results are displayed) Labs Reviewed  COMPREHENSIVE METABOLIC PANEL - Abnormal; Notable for the following components:      Result Value   Glucose, Bld 102 (*)    AST 58 (*)    All other components within normal limits  CBC WITH DIFFERENTIAL/PLATELET - Abnormal; Notable for the following components:   RBC 3.85 (*)    Hemoglobin 10.9 (*)    HCT 36.5 (*)    MCHC 29.9 (*)    Platelets 406 (*)  All other components within normal limits  SARS CORONAVIRUS 2 BY RT PCR (HOSPITAL ORDER, PERFORMED IN [facility])  ETHANOL  RAPID URINE DRUG SCREEN, HOSP PERFORMED    EKG EKG Interpretation  Date/Time:  Sunday October 12 2019 16:58:23 EDT Ventricular Rate:  75 PR Interval:  130 QRS Duration: 102 QT Interval:  354 QTC Calculation: 395 R Axis:   61 Text Interpretation:  Normal sinus rhythm Moderate voltage criteria for LVH, may be normal variant Borderline ECG When compared to prior, no significant changes seen.  No STEMI Confirmed by Kajah Santizo, Chris ([id number]) on 10/12/2019 5:07:15 PM   Radiology No results found.  Procedures Procedures (including critical care time)  Medications Ordered in ED Medications  ziprasidone (GEODON) injection 20 mg (has no administration in time range)    And  LORazepam (ATIVAN) tablet 1 mg (has no administration in time range)  haloperidol lactate (HALDOL) injection 5 mg (5 mg Intramuscular Given 10/12/19 2101)  LORazepam (ATIVAN) injection 2 mg (2 mg Intramuscular Given 10/12/19 2101)     Initial Impression / Assessment and Plan / ED Course  I have reviewed the triage vital signs and the nursing notes.  Pertinent labs & imaging results that were available during my care of the patient were reviewed by me and considered in my medical decision making (see chart for details).         Peter Wilson is a 43 y.o. male with a past medical history significant for schizophrenia who presents under IVC from his act team.  According to IVC paperwork, patient was recently discharged from [facility] 4 days ago and has not been on any medications.  Patient IVC paperwork shows that he has been very aggressive and angry at staff.  Patient has been staying in a hotel.  Patient is very aggressive with conversation and loud during my evaluation but he is denying SI or HI.  He denies any physical complaints at this time.  On exam, lungs are clear and chest is nontender.  Abdomen is nontender.  Patient wants to speak with the psychiatry team to "get things figured out".  Clinically I suspect that patient has been off his medicine and his schizophrenia has worsened.  Patient is agreeable to relax in his room and wait to talk to psychiatry.  TTS will be consulted and anticipate follow-up on their recommendations as patient is under IVC from act team.      11 :14 PM Patient is medically clear for psych evaluation after reassuring labs.  Psychiatry recommends medications and reassessment morning.  I spoke with the psychiatry team and they will place the correct medications for him overnight.  Anticipate reassessment in the morning by psychiatry to determine disposition.  Final Clinical Impressions(s) / ED Diagnoses   Final diagnoses:  Involuntary commitment  Aggressive behavior    Clinical Impression: 1. Involuntary commitment   2. Aggressive behavior     Disposition: Awaiting psychiatric evaluation in the morning for further management.  This note was prepared with assistance of . Occasional wrong-word or sound-a-like substitutions may have occurred due to the inherent limitations of voice recognition software.      Leandrea Ackley, Conservation officer, historic buildings, MD 10/12/19 [phone]
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
## 2019-10-12 NOTE — ED Triage Notes (Signed)
Patient here via [employer] from hotel  IVC'd by ACT Team. Not taking meds, aggressive, mood swings, and recent hospital discharge from [facility].

## 2019-10-13 ENCOUNTER — Encounter (HOSPITAL_COMMUNITY): Payer: Self-pay | Admitting: Registered Nurse

## 2019-10-13 ENCOUNTER — Encounter: Payer: Self-pay | Admitting: Emergency Medicine

## 2019-10-13 ENCOUNTER — Emergency Department
Admission: EM | Admit: 2019-10-13 | Discharge: 2019-10-14 | Disposition: A | Discharge status: Home / Self Care | Payer: Medicare Other | Attending: Student | Admitting: Student

## 2019-10-13 DIAGNOSIS — R251 Tremor, unspecified: Secondary | ICD-10-CM | POA: Diagnosis present

## 2019-10-13 DIAGNOSIS — F1994 Other psychoactive substance use, unspecified with psychoactive substance-induced mood disorder: Secondary | ICD-10-CM

## 2019-10-13 DIAGNOSIS — F17200 Nicotine dependence, unspecified, uncomplicated: Secondary | ICD-10-CM | POA: Diagnosis present

## 2019-10-13 DIAGNOSIS — F602 Antisocial personality disorder: Secondary | ICD-10-CM | POA: Diagnosis present

## 2019-10-13 DIAGNOSIS — F17210 Nicotine dependence, cigarettes, uncomplicated: Secondary | ICD-10-CM | POA: Insufficient documentation

## 2019-10-13 DIAGNOSIS — Z046 Encounter for general psychiatric examination, requested by authority: Secondary | ICD-10-CM

## 2019-10-13 DIAGNOSIS — Z91199 Patient's noncompliance with other medical treatment and regimen due to unspecified reason: Secondary | ICD-10-CM

## 2019-10-13 DIAGNOSIS — Z79899 Other long term (current) drug therapy: Secondary | ICD-10-CM | POA: Insufficient documentation

## 2019-10-13 DIAGNOSIS — J45909 Unspecified asthma, uncomplicated: Secondary | ICD-10-CM | POA: Diagnosis not present

## 2019-10-13 DIAGNOSIS — F250 Schizoaffective disorder, bipolar type: Secondary | ICD-10-CM | POA: Diagnosis present

## 2019-10-13 DIAGNOSIS — R4689 Other symptoms and signs involving appearance and behavior: Secondary | ICD-10-CM | POA: Insufficient documentation

## 2019-10-13 DIAGNOSIS — F209 Schizophrenia, unspecified: Secondary | ICD-10-CM | POA: Insufficient documentation

## 2019-10-13 DIAGNOSIS — F1220 Cannabis dependence, uncomplicated: Secondary | ICD-10-CM | POA: Diagnosis present

## 2019-10-13 DIAGNOSIS — Z9119 Patient's noncompliance with other medical treatment and regimen: Secondary | ICD-10-CM

## 2019-10-13 DIAGNOSIS — R451 Restlessness and agitation: Secondary | ICD-10-CM

## 2019-10-13 LAB — URINE DRUG SCREEN, QUALITATIVE (ARMC ONLY)
Amphetamines, Ur Screen: NOT DETECTED
Barbiturates, Ur Screen: NOT DETECTED
Benzodiazepine, Ur Scrn: NOT DETECTED
Cannabinoid 50 Ng, Ur ~~LOC~~: NOT DETECTED
Cocaine Metabolite,Ur ~~LOC~~: NOT DETECTED
MDMA (Ecstasy)Ur Screen: NOT DETECTED
Methadone Scn, Ur: NOT DETECTED
Opiate, Ur Screen: NOT DETECTED
Phencyclidine (PCP) Ur S: NOT DETECTED
Tricyclic, Ur Screen: NOT DETECTED

## 2019-10-13 LAB — COMPREHENSIVE METABOLIC PANEL
ALT: 31 U/L (ref 0–44)
AST: 60 U/L — ABNORMAL HIGH (ref 15–41)
Albumin: 3.9 g/dL (ref 3.5–5.0)
Alkaline Phosphatase: 69 U/L (ref 38–126)
Anion gap: 9 (ref 5–15)
BUN: 18 mg/dL (ref 6–20)
CO2: 30 mmol/L (ref 22–32)
Calcium: 9.3 mg/dL (ref 8.9–10.3)
Chloride: 100 mmol/L (ref 98–111)
Creatinine, Ser: 0.85 mg/dL (ref 0.61–1.24)
GFR calc Af Amer: >60 mL/min (ref >60)
GFR calc non Af Amer: >60 mL/min (ref >60)
Glucose, Bld: 96 mg/dL (ref 70–99)
Potassium: 4.2 mmol/L (ref 3.5–5.1)
Sodium: 139 mmol/L (ref 135–145)
Total Bilirubin: 0.4 mg/dL (ref 0.3–1.2)
Total Protein: 8.1 g/dL (ref 6.5–8.1)

## 2019-10-13 LAB — CBC
HCT: 32.7 % — ABNORMAL LOW (ref 39.0–52.0)
Hemoglobin: 10.2 g/dL — ABNORMAL LOW (ref 13.0–17.0)
MCH: 28.5 pg (ref 26.0–34.0)
MCHC: 31.2 g/dL (ref 30.0–36.0)
MCV: 91.3 fL (ref 80.0–100.0)
Platelets: 397 10*3/uL (ref 150–400)
RBC: 3.58 MIL/uL — ABNORMAL LOW (ref 4.22–5.81)
RDW: 13.9 % (ref 11.5–15.5)
WBC: 7.5 10*3/uL (ref 4.0–10.5)
nRBC: 0 % (ref 0.0–0.2)

## 2019-10-13 LAB — ACETAMINOPHEN LEVEL: Acetaminophen (Tylenol), Serum: <10 ug/mL — ABNORMAL LOW (ref 10–30)

## 2019-10-13 LAB — SALICYLATE LEVEL: Salicylate Lvl: <7 mg/dL (ref 2.8–30.0)

## 2019-10-13 LAB — ETHANOL: Alcohol, Ethyl (B): <10 mg/dL (ref <10)

## 2019-10-13 NOTE — Discharge Instructions (Signed)
For your behavioral health needs, you are advised to continue treatment with the [facility] ACT Team:       [facility] ACT Team      [address] [place], [place] [address]      [phone]

## 2019-10-13 NOTE — ED Notes (Signed)
Pt DCd off unit to home per provider. Pt alert, cooperative, no s/s of distress. Belongings given to pt . Pt DCd to ACT team caregiver. Pt ambulatory off unit, escorted by NT and security. Pt transported by ACT team caregiver.

## 2019-10-13 NOTE — ED Notes (Signed)
Snack and beverage given. 

## 2019-10-13 NOTE — Discharge Instructions (Signed)

## 2019-10-13 NOTE — ED Provider Notes (Signed)
[facility] Emergency Department Provider Note  ____________________________________________   First MD Initiated Contact with Patient 10/13/19 2223     (approximate)  I have reviewed the triage vital signs and the nursing notes.  History  Chief Complaint Mental Health Problem    HPI Peter Wilson is a 43 y.o. male with history of schizophrenia, substance abuse, multiple recent psychiatric evaluations who presents via EMS for psychiatric evalution.   Patient was just at [facility] recently, after which he presented to [facility] under IVC on (10/18), and was evaluated and deemed not to meet inpatient criteria and was therefore discharged earlier this evening (10/19). Apparently he "ran away" from his care team after this and was therefore brought here.   In triage he was apparently yelling and not making sense, but denied SI or HI.   On arrival to his room, he denied SI or HI to his nurse but stated he thought someone was out to get him.   On my evaluation he is yelling and cursing. I explained to him calmly that he is not to use that kind of language, after which he refused to answer any further questions.    Past Medical Hx Past Medical History:  Diagnosis Date  . Marijuana abuse   . Schizophrenia, paranoid (HCC)   . Tardive dyskinesia   . Tobacco abuse     Problem List Patient Active Problem List   Diagnosis Date Noted  . Substance induced mood disorder (HCC) 10/13/2019  . Aggressive behavior   . Involuntary commitment   . Tremor 04/24/2018  . Schizophrenia (HCC) 04/01/2018  . Noncompliance 04/10/2017  . Asthma 07/13/2016  . Schizoaffective disorder, bipolar type (HCC) 06/27/2016  . Antisocial traits 09/15/2015  . Tobacco use disorder 09/14/2015  . Cannabis use disorder, moderate, dependence (HCC) 09/14/2015    Past Surgical Hx Past Surgical History:  Procedure Laterality Date  . NO PAST SURGERIES      Medications Prior to  Admission medications   Medication Sig Start Date End Date Taking? Authorizing Provider  benztropine (COGENTIN) 1 MG tablet Take 1 tablet (1 mg total) by mouth 2 (two) times daily. 04/24/18 06/23/18  Clapacs, [name], MD  chlorproMAZINE (THORAZINE) 200 MG tablet Take 200 mg by mouth 3 (three) times daily.    [provider]  divalproex (DEPAKOTE) 250 MG DR tablet Take 250 mg by mouth 2 (two) times daily. Takes with 500mg  tablet for a total dose of 750mg     [provider]  divalproex (DEPAKOTE) 500 MG DR tablet Take 1 tablet (500 mg total) by mouth every 8 (eight) hours. Patient taking differently: Take 500 mg by mouth 2 (two) times daily. Takes with 250mg  tablet for a total dose of 750mg  04/17/18   [name], [name], MD  haloperidol decanoate (HALDOL DECANOATE) 100 MG/ML injection Inject 2.5 mLs (250 mg total) into the muscle every 21 ( twenty-one) days. Next dose on 4/30 Patient not taking: Reported on 10/13/2019 04/17/18   [name], 04/19/18 B, MD  traZODone (DESYREL) 50 MG tablet Take 50 mg by mouth at bedtime.    [provider]    Allergies Penicillins  Family Hx History reviewed. No pertinent family history.  Social Hx Social History   Tobacco Use  . Smoking status: Current Every Day Smoker    Packs/day: 2.00    Types: Cigarettes  . Smokeless tobacco: Never Used  Substance Use Topics  . Alcohol use: Yes    Alcohol/week: 0.0 standard drinks  .  Drug use: Yes    Frequency: 7.0 times per week    Types: Marijuana     Review of Systems Unable to obtain due to lack of patient participation.   Physical Exam  Vital Signs: ED Triage Vitals  Enc Vitals Group     BP 10/13/19 2109 (!) 156/102     Pulse Rate 10/13/19 2109 (!) 115     Resp 10/13/19 2109 20     Temp 10/13/19 2109 98.8 F (37.1 C)     Temp Source 10/13/19 2109 Oral     SpO2 10/13/19 2109 99 %     Weight --      Height --      Head Circumference --      Peak Flow --      Pain  Score 10/13/19 2214 0     Pain Loc --      Pain Edu? --      Excl. in [place]? --     Constitutional: Awake and alert. Cursing loudly.  Head: Normocephalic. Atraumatic. Eyes: Conjunctivae clear.  Nose: No epistaxis. Mouth/Throat: Mucous membranes are moist.  Neck: No stridor.   Cardiovascular: Normal rate. Extremities well perfused. Respiratory: Normal respiratory effort.   Gastrointestinal: Soft. Non-tender. Non-distended.  Musculoskeletal: No deformities. Neurologic: No gross focal neurologic deficits are appreciated.  Skin: Skin is warm, dry and intact. No rash noted. Psychiatric: Yelling and cursing. Labile.   EKG  N/A    Radiology  N/A   Procedures  Procedure(s) performed (including critical care):  Procedures   Initial Impression / Assessment and Plan / ED Course  43 y.o. male who presents to the ED for psychiatric evaluation, as above.   Suspect patient's presentation is related to his known psychiatric diagnosis. No evidence of underlying metabolic, infectious, or toxicologic etiology.   Basic screen labs w/o actionable derangements.  Patient seen and evaluated by psychiatry who also reviewed his recently psychiatric evaluations. At this time he does not meet inpatient criteria and is not felt to be a danger to himself or others and is cleared for discharge. Will try to contact his ACT team to assist with pick up coordination.    Final Clinical Impression(s) / ED Diagnosis  Final diagnoses:  Schizophrenia, unspecified type [facility])       Note:  This document was prepared using Dragon voice recognition software and may include unintentional dictation errors.   Lilia Pro., MD 10/14/19 [phone]

## 2019-10-13 NOTE — ED Notes (Signed)
1 trash bag of pt personal belongings that's he brought. As well as  1 black coat 1 black shirt 1 tan pant 1 black shorts  Pt wearing own red shorts with out underpants due to no scrub pants.
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
## 2019-10-13 NOTE — ED Triage Notes (Signed)
Pt arrived via EMS from the streets where pt ran from care givers (from therapeutic service [facility] [phone].) Pt was released from [facility] this AM and has been seen at multiple facilities due to mental health. Per officer, care givers reported that pt has no where to go and refuses to take meds or take help. Pt runs on the street until picked up by police for manic behavior and brought into hospital. Pt is yelling in triage and not making since. Pt keeps stating, "They took it all from me. My daughter and brother are dead. They killed them. Everyone trying to hurt me. They took me to this place [facility] and had to wait on the bus stop for 4 days. They trying to hurt me. No one is trying to help me." Pt is calm but loud in triage. Pt denies SI and HI.

## 2019-10-13 NOTE — BH Assessment (Signed)
[place] Assessment Progress Note  At 15:41 this Probation officer called the [facility] ACT Team again and spoke to [place].  She reports that someone is en route to [facility] to pick pt up.  Pt's nurse, Eustaquio Maize, has been notified.  Jalene Mullet, [place] Coordinator [phone]

## 2019-10-13 NOTE — BH Assessment (Signed)
[place] Assessment Progress Note  Per Hampton Abbot, MD, this pt does not require psychiatric hospitalization at this time.  Pt presents under IVC initiated by pt's outpatient provider with the [facility] ACT [facility], which Dr Dwyane Dee has rescinded.  Pt is to be discharged from [facility] with recommendation to continue treatment with them.  This has been included in pt's discharge instructions.  He is also to be provided with prescriptions.  At the request of Earleen Newport, FNP this writer called the [facility] ACT Team to coordinate care and to arrange for pt to be picked up.  Call was placed at 12:45, and I spoke to [facility].  She agrees to call back with transportation arrangements.  Return call came at 12:52 while I was on another call, but Sherry's message reports that they also coordinate housing for pt, and they will call back later with an approximate time of arrival for pick up.  Pt's nurse, Eustaquio Maize, has been notified.  Jalene Mullet, [place] Triage Specialist [phone]

## 2019-10-13 NOTE — ED Notes (Signed)
Pt. Transferred from Triage to room 22 after dressing out and screening for contraband. Report to include Situation, Background, Assessment and Recommendations from RN. Patient is alert and oriented, warm and dry in no acute distress. Patient denies SI, HI, and AVH. Patient said he is afraid for his life. He thinks someone is out to get him. He said people beating on him. Pt. Encouraged to let me know if needs arise.Pt. Oriented to [employer] including Q15 minute rounds as well as Engineer, drilling for their protection.

## 2019-10-14 ENCOUNTER — Emergency Department (HOSPITAL_COMMUNITY)
Admission: EM | Admit: 2019-10-14 | Discharge: 2019-10-15 | Disposition: A | Discharge status: Home / Self Care | Payer: Medicare Other | Attending: Emergency Medicine | Admitting: Emergency Medicine

## 2019-10-14 ENCOUNTER — Encounter (HOSPITAL_COMMUNITY): Payer: Self-pay | Admitting: Family Medicine

## 2019-10-14 DIAGNOSIS — R4689 Other symptoms and signs involving appearance and behavior: Secondary | ICD-10-CM | POA: Diagnosis present

## 2019-10-14 DIAGNOSIS — Z046 Encounter for general psychiatric examination, requested by authority: Secondary | ICD-10-CM | POA: Insufficient documentation

## 2019-10-14 DIAGNOSIS — F319 Bipolar disorder, unspecified: Secondary | ICD-10-CM | POA: Diagnosis not present

## 2019-10-14 DIAGNOSIS — F17200 Nicotine dependence, unspecified, uncomplicated: Secondary | ICD-10-CM | POA: Diagnosis present

## 2019-10-14 DIAGNOSIS — F23 Brief psychotic disorder: Secondary | ICD-10-CM | POA: Insufficient documentation

## 2019-10-14 DIAGNOSIS — F17210 Nicotine dependence, cigarettes, uncomplicated: Secondary | ICD-10-CM | POA: Diagnosis not present

## 2019-10-14 DIAGNOSIS — Z91199 Patient's noncompliance with other medical treatment and regimen due to unspecified reason: Secondary | ICD-10-CM

## 2019-10-14 DIAGNOSIS — J45909 Unspecified asthma, uncomplicated: Secondary | ICD-10-CM | POA: Insufficient documentation

## 2019-10-14 DIAGNOSIS — F209 Schizophrenia, unspecified: Secondary | ICD-10-CM | POA: Diagnosis present

## 2019-10-14 DIAGNOSIS — F1994 Other psychoactive substance use, unspecified with psychoactive substance-induced mood disorder: Secondary | ICD-10-CM | POA: Diagnosis present

## 2019-10-14 DIAGNOSIS — F250 Schizoaffective disorder, bipolar type: Secondary | ICD-10-CM | POA: Diagnosis present

## 2019-10-14 DIAGNOSIS — F602 Antisocial personality disorder: Secondary | ICD-10-CM | POA: Diagnosis present

## 2019-10-14 DIAGNOSIS — Z79899 Other long term (current) drug therapy: Secondary | ICD-10-CM | POA: Diagnosis not present

## 2019-10-14 DIAGNOSIS — Z9119 Patient's noncompliance with other medical treatment and regimen: Secondary | ICD-10-CM

## 2019-10-14 DIAGNOSIS — F1220 Cannabis dependence, uncomplicated: Secondary | ICD-10-CM | POA: Diagnosis present

## 2019-10-14 LAB — BASIC METABOLIC PANEL
Anion gap: 12 (ref 5–15)
BUN: 13 mg/dL (ref 6–20)
CO2: 24 mmol/L (ref 22–32)
Calcium: 8.9 mg/dL (ref 8.9–10.3)
Chloride: 99 mmol/L (ref 98–111)
Creatinine, Ser: 0.83 mg/dL (ref 0.61–1.24)
GFR calc Af Amer: >60 mL/min (ref >60)
GFR calc non Af Amer: >60 mL/min (ref >60)
Glucose, Bld: 115 mg/dL — ABNORMAL HIGH (ref 70–99)
Potassium: 3.8 mmol/L (ref 3.5–5.1)
Sodium: 135 mmol/L (ref 135–145)

## 2019-10-14 LAB — CBC WITH DIFFERENTIAL/PLATELET
Abs Immature Granulocytes: 0.02 10*3/uL (ref 0.00–0.07)
Basophils Absolute: 0.1 10*3/uL (ref 0.0–0.1)
Basophils Relative: 2 %
Eosinophils Absolute: 0.2 10*3/uL (ref 0.0–0.5)
Eosinophils Relative: 2 %
HCT: 33 % — ABNORMAL LOW (ref 39.0–52.0)
Hemoglobin: 10.2 g/dL — ABNORMAL LOW (ref 13.0–17.0)
Immature Granulocytes: 0 %
Lymphocytes Relative: 43 %
Lymphs Abs: 2.8 10*3/uL (ref 0.7–4.0)
MCH: 29.1 pg (ref 26.0–34.0)
MCHC: 30.9 g/dL (ref 30.0–36.0)
MCV: 94 fL (ref 80.0–100.0)
Monocytes Absolute: 0.9 10*3/uL (ref 0.1–1.0)
Monocytes Relative: 13 %
Neutro Abs: 2.7 10*3/uL (ref 1.7–7.7)
Neutrophils Relative %: 40 %
Platelets: 394 10*3/uL (ref 150–400)
RBC: 3.51 MIL/uL — ABNORMAL LOW (ref 4.22–5.81)
RDW: 14.3 % (ref 11.5–15.5)
WBC: 6.7 10*3/uL (ref 4.0–10.5)
nRBC: 0 % (ref 0.0–0.2)

## 2019-10-14 LAB — ETHANOL: Alcohol, Ethyl (B): <10 mg/dL (ref <10)

## 2019-10-14 MED ORDER — LORAZEPAM 2 MG/ML IJ SOLN
2.0000 mg | Freq: Once | INTRAMUSCULAR | Status: AC
  Administered 2019-10-14: 2 mg via INTRAMUSCULAR

## 2019-10-14 MED ORDER — LORAZEPAM 2 MG/ML IJ SOLN
INTRAMUSCULAR | Status: AC
  Filled 2019-10-14: qty 1

## 2019-10-14 MED ORDER — ZIPRASIDONE MESYLATE 20 MG IM SOLR
INTRAMUSCULAR | Status: AC
  Filled 2019-10-14: qty 20

## 2019-10-14 MED ORDER — ZIPRASIDONE MESYLATE 20 MG IM SOLR
20.0000 mg | Freq: Once | INTRAMUSCULAR | Status: AC
  Administered 2019-10-14: 20 mg via INTRAMUSCULAR

## 2019-10-14 NOTE — ED Triage Notes (Signed)
Patient arrived via [employer] for aggressive behavior when attempting to serve IVC papers. IVC papers state he diagnosed with schizophrenia, delusional, and unable to meet his basic needs. Patient was calm, in hand cuffs when entering the facility. However, he was refusing to do simple commands as sitting down on the stretcher. Per [employer], he stated he would fight if taken out of the handcuffs. When patient entered the room, while he was standing, asked permission to obtain vital signs, but he didn't answer. Informed Dr. Eulis Foster to please come evaluate patient, in which he did. Patient would not answer to Dr. Eulis Foster. Orders verbalized for chemical and physical restraints. While getting the medication ready, patient sat down on the bed without aggressive behavior.

## 2019-10-14 NOTE — Consult Note (Signed)
[facility] Face-to-Face Psychiatry Consult   Reason for Consult: Medication noncompliance Referring Physician: Dr. Colon Branch Patient Identification: Peter Wilson MRN:  [id number] Principal Diagnosis: <principal problem not specified> Diagnosis:  Active Problems:   Tobacco use disorder   Cannabis use disorder, moderate, dependence (HCC)   Antisocial traits   Schizoaffective disorder, bipolar type (HCC)   Asthma   Noncompliance   Schizophrenia (HCC)   Tremor   Substance induced mood disorder (HCC)   Aggressive behavior   Total Time spent with patient: 45 minutes  Subjective: "I do remember anything.  Stop asking me questions." Peter Wilson is a 43 y.o. male patient was brought in via EMS voluntary due to his ACT team for he said that he needed to be assessed for mental health problems. The patient chart was reviewed on 10/13/19 and discussed with Dr. York Cerise that the patient does not meet the criteria for being admitted to the psychiatric inpatient unit.    On evaluation, the patient states that he does not remember anything, but earlier, he answered all questions presented to him.  "I saw people doing things that they were not supposed to be doing.  Do you get what I mean?"  The patient said, "this is somebody on the job, and I have proof of it"  Patient reports he has been compliant with his medications, states that he has outpatient psychiatric services an ACTT with [facility].  The patient says that he helps care for his 39 year old daughter.  At this time patient denies suicidal, self-harm, and homicidal ideations. He also denies psychosis and paranoia. During the evaluation, the patient is alert, oriented x 4; formerly abusive to staff, uncooperative; and the mood is congruent with affect.  He does not appear to be responding to internal or external stimuli or delusional thoughts.  The patient denies suicidal, self-harm, or homicidal ideation, psychosis, and paranoia.  The patient answered the  question appropriately.    Past Psychiatric History:   Risk to Self:  No Risk to Others:  No Prior Inpatient Therapy:  Yes Prior Outpatient Therapy:  Yes  Past Medical History:  Past Medical History:  Diagnosis Date  . Marijuana abuse   . Schizophrenia, paranoid (HCC)   . Tardive dyskinesia   . Tobacco abuse     Past Surgical History:  Procedure Laterality Date  . NO PAST SURGERIES     Family History: History reviewed. No pertinent family history. Family Psychiatric  History:  Social History:  Social History   Substance and Sexual Activity  Alcohol Use Yes  . Alcohol/week: 0.0 standard drinks     Social History   Substance and Sexual Activity  Drug Use Yes  . Frequency: 7.0 times per week  . Types: Marijuana    Social History   Socioeconomic History  . Marital status: Single    Spouse name: Not on file  . Number of children: Not on file  . Years of education: Not on file  . Highest education level: Not on file  Occupational History  . Occupation: Disability  Social Needs  . Financial resource strain: Not on file  . Food insecurity    Worry: Not on file    Inability: Not on file  . Transportation needs    Medical: Not on file    Non-medical: Not on file  Tobacco Use  . Smoking status: Current Every Day Smoker    Packs/day: 2.00    Types: Cigarettes  . Smokeless tobacco: Never Used  Substance and Sexual Activity  .  Alcohol use: Yes    Alcohol/week: 0.0 standard drinks  . Drug use: Yes    Frequency: 7.0 times per week    Types: Marijuana  . Sexual activity: Not on file  Lifestyle  . Physical activity    Days per week: Not on file    Minutes per session: Not on file  . Stress: Not on file  Relationships  . Social Herbalist on phone: Not on file    Gets together: Not on file    Attends religious service: Not on file    Active member of club or organization: Not on file    Attends meetings of clubs or organizations: Not on file     Relationship status: Not on file  Other Topics Concern  . Not on file  Social History Narrative   Pt discharged from [facility] on 10/10/2019, was assigned to a hotel in [place] by his ACTT provider, [facility] in [place], [place].     Additional Social History:    Allergies:   Allergies  Allergen Reactions  . Penicillins Rash    Labs:  Results for orders placed or performed during the hospital encounter of 10/13/19 (from the past 48 hour(s))  Comprehensive metabolic panel     Status: Abnormal   Collection Time: 10/13/19  9:20 PM  Result Value Ref Range   Sodium 139 135 - 145 mmol/L   Potassium 4.2 3.5 - 5.1 mmol/L   Chloride 100 98 - 111 mmol/L   CO2 30 22 - 32 mmol/L   Glucose, Bld 96 70 - 99 mg/dL   BUN 18 6 - 20 mg/dL   Creatinine, Ser 0.85 0.61 - 1.24 mg/dL   Calcium 9.3 8.9 - 10.3 mg/dL   Total Protein 8.1 6.5 - 8.1 g/dL   Albumin 3.9 3.5 - 5.0 g/dL   AST 60 (H) 15 - 41 U/L   ALT 31 0 - 44 U/L   Alkaline Phosphatase 69 38 - 126 U/L   Total Bilirubin 0.4 0.3 - 1.2 mg/dL   GFR calc non Af Amer >60 >60 mL/min   GFR calc Af Amer >60 >60 mL/min   Anion gap 9 5 - 15    Comment: Performed at [facility], [address] [place], [place] [address]  Ethanol     Status: None   Collection Time: 10/13/19  9:20 PM  Result Value Ref Range   Alcohol, Ethyl (B) <10 <10 mg/dL    Comment: (NOTE) Lowest detectable limit for serum alcohol is 10 mg/dL. For medical purposes only. Performed at [facility], [address] [place], [place] [address]   Salicylate level     Status: None   Collection Time: 10/13/19  9:20 PM  Result Value Ref Range   Salicylate Lvl <1.3 2.8 - 30.0 mg/dL    Comment: Performed at [facility], [address] [place], [place] [address]  Acetaminophen level     Status: Abnormal   Collection Time: 10/13/19  9:20 PM  Result Value Ref Range   Acetaminophen (Tylenol), Serum <10 (L) 10 - 30 ug/mL    Comment:  (NOTE) Therapeutic concentrations vary significantly. A range of 10-30 ug/mL  may be an effective concentration for many patients. However, some  are best treated at concentrations outside of this range. Acetaminophen concentrations >150 ug/mL at 4 hours after ingestion  and >50 ug/mL at 12 hours after ingestion are often associated with  toxic reactions. Performed at [facility],  [address] [place], [place] [address]   cbc     Status: Abnormal   Collection Time: 10/13/19  9:20 PM  Result Value Ref Range   WBC 7.5 4.0 - 10.5 K/uL   RBC 3.58 (L) 4.22 - 5.81 MIL/uL   Hemoglobin 10.2 (L) 13.0 - 17.0 g/dL   HCT 08.632.7 (L) 57.839.0 - 46.952.0 %   MCV 91.3 80.0 - 100.0 fL   MCH 28.5 26.0 - 34.0 pg   MCHC 31.2 30.0 - 36.0 g/dL   RDW 62.913.9 52.811.5 - 41.315.5 %   Platelets 397 150 - 400 K/uL   nRBC 0.0 0.0 - 0.2 %    Comment: Performed at [facility], [address] [place], [place] [address]  Urine Drug Screen, Qualitative     Status: None   Collection Time: 10/13/19  9:20 PM  Result Value Ref Range   Tricyclic, Ur Screen NONE DETECTED NONE DETECTED   Amphetamines, Ur Screen NONE DETECTED NONE DETECTED   MDMA (Ecstasy)Ur Screen NONE DETECTED NONE DETECTED   Cocaine Metabolite,Ur [place] NONE DETECTED NONE DETECTED   Opiate, Ur Screen NONE DETECTED NONE DETECTED   Phencyclidine (PCP) Ur S NONE DETECTED NONE DETECTED   Cannabinoid 50 Ng, Ur [place] NONE DETECTED NONE DETECTED   Barbiturates, Ur Screen NONE DETECTED NONE DETECTED   Benzodiazepine, Ur Scrn NONE DETECTED NONE DETECTED   Methadone Scn, Ur NONE DETECTED NONE DETECTED    Comment: (NOTE) Tricyclics + metabolites, urine    Cutoff 1000 ng/mL Amphetamines + metabolites, urine  Cutoff 1000 ng/mL MDMA (Ecstasy), urine              Cutoff 500 ng/mL Cocaine Metabolite, urine          Cutoff 300 ng/mL Opiate + metabolites, urine        Cutoff 300 ng/mL Phencyclidine (PCP), urine         Cutoff 25 ng/mL Cannabinoid, urine                  Cutoff 50 ng/mL Barbiturates + metabolites, urine  Cutoff 200 ng/mL Benzodiazepine, urine              Cutoff 200 ng/mL Methadone, urine                   Cutoff 300 ng/mL The urine drug screen provides only a preliminary, unconfirmed analytical test result and should not be used for non-medical purposes. Clinical consideration and professional judgment should be applied to any positive drug screen result due to possible interfering substances. A more specific alternate chemical method must be used in order to obtain a confirmed analytical result. Gas chromatography / mass spectrometry (GC/MS) is the preferred confirmat ory method. Performed at [facility], [address] [place], [place] [address]     No current facility-administered medications for this encounter.    Current Outpatient Medications  Medication Sig Dispense Refill  . benztropine (COGENTIN) 1 MG tablet Take 1 tablet (1 mg total) by mouth 2 (two) times daily. 60 tablet 1  . chlorproMAZINE (THORAZINE) 200 MG tablet Take 200 mg by mouth 3 (three) times daily.    . divalproex (DEPAKOTE) 250 MG DR tablet Take 250 mg by mouth 2 (two) times daily. Takes with 500mg  tablet for a total dose of 750mg     . divalproex (DEPAKOTE) 500 MG DR tablet Take 1 tablet (500 mg total) by mouth every 8 (eight) hours. (Patient taking differently: Take 500 mg by mouth  2 (two) times daily. Takes with 250mg  tablet for a total dose of 750mg ) 90 tablet 1  . haloperidol decanoate (HALDOL DECANOATE) 100 MG/ML injection Inject 2.5 mLs (250 mg total) into the muscle every 21 ( twenty-one) days. Next dose on 4/30 (Patient not taking: Reported on 10/13/2019) 2.5 mL 1  . traZODone (DESYREL) 50 MG tablet Take 50 mg by mouth at bedtime.      Musculoskeletal: Strength & Muscle Tone: within normal limits Gait & Station: normal Patient leans: N/A  Psychiatric Specialty Exam: Physical Exam  Nursing note and vitals  reviewed. Constitutional: He is oriented to person, place, and time. He appears well-developed.  Neck: Normal range of motion. Neck supple.  Cardiovascular: Normal rate.  Respiratory: Effort normal.  Musculoskeletal: Normal range of motion.  Neurological: He is alert and oriented to person, place, and time.    Review of Systems  All other systems reviewed and are negative.   Blood pressure (!) 156/102, pulse (!) 115, temperature 98.8 F (37.1 C), temperature source Oral, resp. rate 20, SpO2 99 %.There is no height or weight on file to calculate BMI.  General Appearance: Bizarre, Disheveled and Guarded  Eye Contact:  Good  Speech:  Clear and Coherent  Volume:  Increased  Mood:  Angry, Anxious and Irritable  Affect:  Congruent  Thought Process:  Coherent  Orientation:  Full (Time, Place, and Person)  Thought Content:  Logical and Obsessions  Suicidal Thoughts:  No  Homicidal Thoughts:  No  Memory:  Immediate;   Fair Recent;   Fair Remote;   Fair  Judgement:  Fair  Insight:  Fair  Psychomotor Activity:  Normal  Concentration:  Concentration: Fair and Attention Span: Fair  Recall:  5/30 of Knowledge:  Fair  Language:  Fair  Akathisia:  Negative  Handed:  Right  AIMS (if indicated):     Assets:  Desire for Improvement Housing  ADL's:  Intact  Cognition:  WNL  Sleep:        Treatment Plan Summary: Medication management and Plan The patient does not meet criteria for psychiatric inpatient admission.  Disposition: No evidence of imminent risk to self or others at present.   Patient does not meet criteria for psychiatric inpatient admission. Supportive therapy provided about ongoing stressors.  10/15/2019, NP 10/14/2019 2:10 AM

## 2019-10-14 NOTE — ED Notes (Signed)
Per NT patient walked out of the hospital. NT and officer tried to talk to him but patient said he is here voluntarily and he needs to leave. Notified charge nurse and EDP.

## 2019-10-14 NOTE — ED Notes (Signed)
Hourly rounding reveals patient in room. No complaints, stable, in no acute distress. Q15 minute rounds and monitoring via [place] and Officer to continue.   

## 2019-10-14 NOTE — Progress Notes (Signed)
2mg  Ativan and 20mg  Geodon IM given. Hard restraints started. Patient calm and cooperative. Will continue to monitor.

## 2019-10-14 NOTE — ED Notes (Signed)
TTS received a call from nurse Hewan to inform TTS that the patient has "walked out".  TTS attempted to contact ACTT team member Areta Haber [phone]  Multiple calls were made. No one answered.  TTS attempted to reach Crisis line at [facility] - [phone].  No one answered.  TTS will continue to try to reach ACTT team member.

## 2019-10-14 NOTE — ED Notes (Signed)
Patient ready to be discharged. TTS contacted Act team to let them know that patient is to be discharge. Act team said they will come pick up patient in the morning per TTS. No issues.

## 2019-10-14 NOTE — ED Notes (Addendum)
Pt yelling obsenities in his room and tyelling but not making any sense.  Pt came out of his room several times stating he needs to talk to someone about a suitcase.  Pt standing in the hallway crying saying theres a light in his room that's scaring him.  Security turned his light on in his room per pts request so he wouldn't see the light anymore.  Pt states "Im here voluntarily, Im not IVC'd and I can leave if I want."  Tried to tell pt we were trying to find a ride for him and he said hes going to walk.  Pt proceeded to leave the ED.  MD, RN, and charge nurse notified.  Pt ambulated out of ED in shorts and [place] tshirt.  Refused to wait for his belongings.

## 2019-10-14 NOTE — ED Notes (Signed)
Patient was waiting for ACT team to pick him up from hospital tomorrow morning. Patient decided to walk out of the hospital by himself.  NT and officer tried to talk to him to wait for his ride but patient said he is going to walk per NT. EDP and charge nurse aware.

## 2019-10-15 MED ORDER — DIVALPROEX SODIUM 250 MG PO DR TAB
250.0000 mg | DELAYED_RELEASE_TABLET | Freq: Two times a day (BID) | ORAL | Status: DC
  Administered 2019-10-15: 250 mg via ORAL
  Filled 2019-10-15: qty 1

## 2019-10-15 MED ORDER — TRAZODONE HCL 50 MG PO TABS: 50.0000 mg | ORAL_TABLET | Freq: Every day | ORAL | Status: DC

## 2019-10-15 MED ORDER — BENZTROPINE MESYLATE 1 MG PO TABS
1.0000 mg | ORAL_TABLET | Freq: Two times a day (BID) | ORAL | Status: DC
  Administered 2019-10-15: 1 mg via ORAL
  Filled 2019-10-15: qty 1

## 2019-10-15 MED ORDER — CHLORPROMAZINE HCL 25 MG PO TABS
200.0000 mg | ORAL_TABLET | Freq: Two times a day (BID) | ORAL | Status: DC
  Administered 2019-10-15: 200 mg via ORAL
  Filled 2019-10-15: qty 8

## 2019-10-15 NOTE — ED Provider Notes (Signed)
[facility] DEPT Provider Note   CSN: [id number] Arrival date & time: 10/14/19  2203     History   Chief Complaint Chief Complaint  Patient presents with  . Aggressive Behavior    HPI Peter Wilson is a 43 y.o. male.     HPI   He presents by law enforcement for aggressive behavior, under involuntary commitment, signed by his assessment and crisis team caregiver.  On arrival of law enforcement, he was aggressive and required to be handcuffed and still struggling for 2 prevent his being transferred.  Patient refused to talk to me.  Level 5 caveat-altered mental status  Past Medical History:  Diagnosis Date  . Marijuana abuse   . Schizophrenia, paranoid ([place])   . Tardive dyskinesia   . Tobacco abuse     Patient Active Problem List   Diagnosis Date Noted  . Substance induced mood disorder ([place]) 10/13/2019  . Aggressive behavior   . Involuntary commitment   . Tremor 04/24/2018  . Schizophrenia ([place]) 04/01/2018  . Noncompliance 04/10/2017  . Asthma 07/13/2016  . Schizoaffective disorder, bipolar type ([place]) 06/27/2016  . Antisocial traits 09/15/2015  . Tobacco use disorder 09/14/2015  . Cannabis use disorder, moderate, dependence ([place]) 09/14/2015    Past Surgical History:  Procedure Laterality Date  . NO PAST SURGERIES          Home Medications    Prior to Admission medications   Medication Sig Start Date End Date Taking? Authorizing Provider  chlorproMAZINE (THORAZINE) 200 MG tablet Take 200 mg by mouth 2 (two) times daily.    Yes [provider]  divalproex (DEPAKOTE) 250 MG DR tablet Take 250 mg by mouth 2 (two) times daily. Takes with 540m tablet for a total dose of 7555m  Yes [provider]  divalproex (DEPAKOTE) 500 MG DR tablet Take 1 tablet (500 mg total) by mouth every 8 (eight) hours. Patient taking differently: Take 500 mg by mouth 2 (two) times daily. Takes with 25063mablet for a total dose of  750m61m24/19  Yes [name], [name], MD  traZODone (DESYREL) 50 MG tablet Take 50 mg by mouth at bedtime.   Yes [provider]  benztropine (COGENTIN) 1 MG tablet Take 1 tablet (1 mg total) by mouth 2 (two) times daily. 04/24/18 06/23/18  Clapacs, JohnMadie Reno  haloperidol decanoate (HALDOL DECANOATE) 100 MG/ML injection Inject 2.5 mLs (250 mg total) into the muscle every 21 ( twenty-one) days. Next dose on 4/30 Patient not taking: Reported on 10/13/2019 04/17/18   PuciClovis Fredrickson    Family History History reviewed. No pertinent family history.  Social History Social History   Tobacco Use  . Smoking status: Current Every Day Smoker    Packs/day: 2.00    Types: Cigarettes  . Smokeless tobacco: Never Used  . Tobacco comment: Unknown   Substance Use Topics  . Alcohol use: Yes    Alcohol/week: 0.0 standard drinks    Comment: Unknown   . Drug use: Yes    Frequency: 7.0 times per week    Types: Marijuana    Comment: Unknown      Allergies   Penicillins   Review of Systems Review of Systems  Unable to perform ROS: Mental status change     Physical Exam Updated Vital Signs BP 108/69   Pulse 88   Temp 98 F (36.7 C) (Axillary)   Resp 20   SpO2 96%   Physical Exam Vitals signs  and nursing note reviewed.  Constitutional:      General: He is not in acute distress.    Appearance: He is well-developed. He is not ill-appearing or diaphoretic.  HENT:     Head: Normocephalic and atraumatic.     Right Ear: External ear normal.     Left Ear: External ear normal.  Neck:     Musculoskeletal: Normal range of motion and neck supple.     Trachea: Phonation normal.  Cardiovascular:     Rate and Rhythm: Normal rate.  Pulmonary:     Effort: Pulmonary effort is normal.  Musculoskeletal: Normal range of motion.  Skin:    General: Skin is warm and dry.  Neurological:     Mental Status: He is alert.     Cranial Nerves: No cranial nerve deficit.     Motor: No  abnormal muscle tone.     Comments: Patient standing erect, he is handcuffed, he does not exhibit ataxia.  Psychiatric:        Attention and Perception: He is attentive.        Mood and Affect: Affect is blunt.        Speech: He is noncommunicative.        Behavior: Behavior is not agitated, aggressive or hyperactive.        Cognition and Memory: Cognition is impaired.        Judgment: Judgment is inappropriate.      ED Treatments / Results  Labs (all labs ordered are listed, but only abnormal results are displayed) Labs Reviewed  BASIC METABOLIC PANEL - Abnormal; Notable for the following components:      Result Value   Glucose, Bld 115 (*)    All other components within normal limits  CBC WITH DIFFERENTIAL/PLATELET - Abnormal; Notable for the following components:   RBC 3.51 (*)    Hemoglobin 10.2 (*)    HCT 33.0 (*)    All other components within normal limits  ETHANOL  RAPID URINE DRUG SCREEN, HOSP PERFORMED    EKG None  Radiology No results found.  Procedures Procedures (including critical care time)  Medications Ordered in ED Medications  ziprasidone (GEODON) injection 20 mg (20 mg Intramuscular Given 10/14/19 2246)  LORazepam (ATIVAN) injection 2 mg (2 mg Intramuscular Given 10/14/19 2246)     Initial Impression / Assessment and Plan / ED Course  I have reviewed the triage vital signs and the nursing notes.  Pertinent labs & imaging results that were available during my care of the patient were reviewed by me and considered in my medical decision making (see chart for details).  Clinical Course as of Oct 15 23  Wed Oct 15, 2019  0014 Normal  Ethanol [EW]  0014 Normal except glucose high  Basic metabolic panel(!) [EW]  9390 Normal except hemoglobin low  CBC with Differential(!) [EW]  0023 At this point the patient is medically cleared for treatment by psychiatry.   [EW]    Clinical Course User Index [EW] Daleen Bo, MD        Patient  Vitals for the past 24 hrs:  BP Temp Temp src Pulse Resp SpO2  10/14/19 2345 108/69 98 F (36.7 C) Axillary 88 20 -  10/14/19 2240 115/73 98.7 F (37.1 C) Oral 82 20 96 %     Medical Decision Making: Abnormal behavior, patient with bipolar disorder and she is a refractive disorder.  He was assessed in the outpatient setting by his assessment crisis team who felt  that he met criteria for involuntary commitment.  IVC commitment upheld here.  Patient has had 2 prior evaluations in the last 72 hours for similar problems.  CRITICAL CARE-no Performed by: Daleen Bo   Nursing Notes Reviewed/ Care Coordinated Applicable Imaging Reviewed Interpretation of Laboratory Data incorporated into ED treatment   Plan-as per TTS in conjunction with oncoming provider team    Final Clinical Impressions(s) / ED Diagnoses   Final diagnoses:  Acute psychosis ([place])  Bipolar affective disorder, remission status unspecified [facility])    ED Discharge Orders    None       Daleen Bo, MD 10/15/19 [phone]

## 2019-10-15 NOTE — Progress Notes (Addendum)
CSW contacted Judeen Hammans, RN with [facility] [phone]) and informed her that pt was currently at [facility] ED. She will send ACTT staff to assess pt in ED and will call CSW back with scheduled time/details.   Audree Camel, [name], [place] Disposition CSW [facility]/TTS [phone]   UPDATE: ACTT staff will arrive at [facility] ED to assess pt at 130pm.

## 2019-10-15 NOTE — BHH Counselor (Signed)
Clinician spoke to Junior, RN pt is still sleeping and unable to engage in TTS assessment. Per RN note: "pt was given 2mg  Ativan and 20mg  Geodon IM." Clinician to assess pt once he's able to engage.    Vertell Novak, [place], [facility], [facility] Triage Specialist [phone] or [phone].

## 2019-10-15 NOTE — BHH Suicide Risk Assessment (Cosign Needed)
Suicide Risk Assessment  Discharge Assessment   [facility] Discharge Suicide Risk Assessment   Principal Problem: Antisocial personality disorder [facility]) Discharge Diagnoses: Principal Problem:   Antisocial traits Active Problems:   Tobacco use disorder   Cannabis use disorder, moderate, dependence (HCC)   Schizoaffective disorder, bipolar type ([place])   Noncompliance   Schizophrenia ([place])   Substance induced mood disorder (H[facility])   Aggressive behavior   Total Time spent with patient: 30 minutes  Musculoskeletal: Strength & Muscle Tone: within normal limits Gait & Station: normal Patient leans: N/A  Psychiatric Specialty Exam:   Blood pressure 100/65, pulse 61, temperature 98 F (36.7 C), temperature source Axillary, resp. rate 20, SpO2 98 %.There is no height or weight on file to calculate BMI.  General Appearance: Casual  Eye Contact::  Good  Speech:  Clear and Coherent and Normal Rate409  Volume:  Normal  Mood:  "Good"  Appropriate  Affect:  Appropriate and Congruent  Thought Process:  Coherent, Goal Directed and Descriptions of Associations: Intact  Orientation:  Full (Time, Place, and Person)  Thought Content:  WDL and Logical  Suicidal Thoughts:  No  Homicidal Thoughts:  No  Memory:  Immediate;   Good Recent;   Good  Judgement:  Intact  Insight:  Present  Psychomotor Activity:  Normal  Concentration:  Good  Recall:  Good  Fund of Knowledge:Fair  Language: Good  Akathisia:  No  Handed:  Right  AIMS (if indicated):     Assets:  Communication Skills Desire for Improvement Social Support  Sleep:     Cognition: WNL  ADL's:  Intact   Mental Status Per Nursing Assessment::   On Admission:     Demographic Factors:  Male  Loss Factors: NA  Historical Factors: NA  Risk Reduction Factors:   Sense of responsibility to family and Religious beliefs about death  Continued Clinical Symptoms:  Previous Psychiatric Diagnoses and Treatments  Cognitive Features That  Contribute To Risk:  None    Suicide Risk:  Minimal: No identifiable suicidal ideation.  Patients presenting with no risk factors but with morbid ruminations; may be classified as minimal risk based on the severity of the depressive symptoms   Plan Of Care/Follow-up recommendations:  Activity:  As tolerated Diet:  Heart healthy Other:  Follow up with ACTT team and psychiatric provider     Lima Rankin, NP 10/15/2019, 12:27 PM
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
## 2019-10-15 NOTE — ED Provider Notes (Signed)
Vitals:   10/15/19 0145 10/15/19 0643  BP: 108/70 100/65  Pulse: 62 61  Resp: 20 20  Temp: 98 F (36.7 C) 98 F (36.7 C)  SpO2:  98%   Patient presents to the ED for evaluation of [place] behavior.  Patient was placed under IVC.  Patient was given Ativan and Geodon last evening.  Patient is pending psychiatric assessment as he has been sleeping.  We will continue to monitor.   Dorie Rank, MD 10/15/19 [phone]

## 2019-10-15 NOTE — ED Notes (Signed)
Pt DC d off unit to home per MD. Pt alert, cooperative, no s/s of distress. Pt DC information given to ACT team member. Belongings given to pt. Pt ambulatory off unit. Pt transported by ACT team member.

## 2019-10-15 NOTE — BHH Counselor (Signed)
Per Junior, RN pt is currently sleeping and unable to engage. Clinician asked Junior, RN to call once pt is able to engage in TTS assessment.    Vertell Novak, MS, [facility], [facility] Triage Specialist [phone].

## 2019-10-15 NOTE — ED Notes (Signed)
Pt out of restraints by previous nurse. Pt in bed, resting comfortably.

## 2019-10-18 ENCOUNTER — Other Ambulatory Visit: Payer: Self-pay

## 2019-10-18 ENCOUNTER — Encounter (HOSPITAL_COMMUNITY): Payer: Self-pay | Admitting: Emergency Medicine

## 2019-10-18 ENCOUNTER — Emergency Department (HOSPITAL_COMMUNITY)
Admission: EM | Admit: 2019-10-18 | Discharge: 2019-10-19 | Disposition: A | Discharge status: Home / Self Care | Payer: Medicare Other | Attending: Emergency Medicine | Admitting: Emergency Medicine

## 2019-10-18 DIAGNOSIS — Z789 Other specified health status: Secondary | ICD-10-CM

## 2019-10-18 DIAGNOSIS — F10929 Alcohol use, unspecified with intoxication, unspecified: Secondary | ICD-10-CM | POA: Diagnosis not present

## 2019-10-18 DIAGNOSIS — Z79899 Other long term (current) drug therapy: Secondary | ICD-10-CM | POA: Diagnosis not present

## 2019-10-18 DIAGNOSIS — F17210 Nicotine dependence, cigarettes, uncomplicated: Secondary | ICD-10-CM | POA: Insufficient documentation

## 2019-10-18 DIAGNOSIS — Y900 Blood alcohol level of less than 20 mg/100 ml: Secondary | ICD-10-CM | POA: Insufficient documentation

## 2019-10-18 DIAGNOSIS — Z590 Homelessness: Secondary | ICD-10-CM | POA: Insufficient documentation

## 2019-10-18 DIAGNOSIS — Z7289 Other problems related to lifestyle: Secondary | ICD-10-CM

## 2019-10-18 DIAGNOSIS — F200 Paranoid schizophrenia: Secondary | ICD-10-CM | POA: Insufficient documentation

## 2019-10-18 DIAGNOSIS — R440 Auditory hallucinations: Secondary | ICD-10-CM | POA: Diagnosis present

## 2019-10-18 LAB — CBC
HCT: 30.9 % — ABNORMAL LOW (ref 39.0–52.0)
Hemoglobin: 9.6 g/dL — ABNORMAL LOW (ref 13.0–17.0)
MCH: 29.2 pg (ref 26.0–34.0)
MCHC: 31.1 g/dL (ref 30.0–36.0)
MCV: 93.9 fL (ref 80.0–100.0)
Platelets: 440 10*3/uL — ABNORMAL HIGH (ref 150–400)
RBC: 3.29 MIL/uL — ABNORMAL LOW (ref 4.22–5.81)
RDW: 14.6 % (ref 11.5–15.5)
WBC: 6.5 10*3/uL (ref 4.0–10.5)
nRBC: 0 % (ref 0.0–0.2)

## 2019-10-18 LAB — ACETAMINOPHEN LEVEL: Acetaminophen (Tylenol), Serum: <10 ug/mL — ABNORMAL LOW (ref 10–30)

## 2019-10-18 LAB — RAPID URINE DRUG SCREEN, HOSP PERFORMED
Amphetamines: NOT DETECTED
Barbiturates: NOT DETECTED
Benzodiazepines: NOT DETECTED
Cocaine: NOT DETECTED
Opiates: NOT DETECTED
Tetrahydrocannabinol: POSITIVE — AB

## 2019-10-18 LAB — ETHANOL: Alcohol, Ethyl (B): 14 mg/dL — ABNORMAL HIGH (ref <10)

## 2019-10-18 LAB — SALICYLATE LEVEL: Salicylate Lvl: <7 mg/dL (ref 2.8–30.0)

## 2019-10-18 NOTE — ED Triage Notes (Signed)
Pt arrived via [facility].  C/o heavy ETOH and feeling woozy after taking 2 Thorazine pills that belonged to his friend.  Denies SI/HI.

## 2019-10-18 NOTE — ED Notes (Signed)
Pt wandering around in lobby, asking people for money and why they are here. Security asked pt to not do so. Pt became agitated and began yelling. Pt left but then came back in stating that he was going to walk in front of a car. Pt walked out of the building. [employer] went after patient.

## 2019-10-19 ENCOUNTER — Encounter (HOSPITAL_COMMUNITY): Payer: Self-pay | Admitting: Behavioral Health

## 2019-10-19 LAB — COMPREHENSIVE METABOLIC PANEL
ALT: 16 U/L (ref 0–44)
AST: 34 U/L (ref 15–41)
Albumin: 3.5 g/dL (ref 3.5–5.0)
Alkaline Phosphatase: 70 U/L (ref 38–126)
Anion gap: 10 (ref 5–15)
BUN: 14 mg/dL (ref 6–20)
CO2: 26 mmol/L (ref 22–32)
Calcium: 9.1 mg/dL (ref 8.9–10.3)
Chloride: 104 mmol/L (ref 98–111)
Creatinine, Ser: 0.93 mg/dL (ref 0.61–1.24)
GFR calc Af Amer: >60 mL/min (ref >60)
GFR calc non Af Amer: >60 mL/min (ref >60)
Glucose, Bld: 75 mg/dL (ref 70–99)
Potassium: 3.7 mmol/L (ref 3.5–5.1)
Sodium: 140 mmol/L (ref 135–145)
Total Bilirubin: 0.2 mg/dL — ABNORMAL LOW (ref 0.3–1.2)
Total Protein: 7.2 g/dL (ref 6.5–8.1)

## 2019-10-19 MED ORDER — BENZTROPINE MESYLATE 1 MG PO TABS: 1.0000 mg | ORAL_TABLET | Freq: Every day | ORAL | Status: DC

## 2019-10-19 MED ORDER — DIVALPROEX SODIUM 250 MG PO DR TAB: 750.0000 mg | DELAYED_RELEASE_TABLET | Freq: Two times a day (BID) | ORAL | Status: DC

## 2019-10-19 MED ORDER — LORAZEPAM 1 MG PO TABS
0.0000 mg | ORAL_TABLET | Freq: Four times a day (QID) | ORAL | Status: DC
  Administered 2019-10-19: 08:00:00 2 mg via ORAL
  Filled 2019-10-19: qty 2

## 2019-10-19 MED ORDER — HALOPERIDOL 1 MG PO TABS: 2.0000 mg | ORAL_TABLET | Freq: Two times a day (BID) | ORAL | Status: DC

## 2019-10-19 MED ORDER — LORAZEPAM 2 MG/ML IJ SOLN: 0.0000 mg | Freq: Two times a day (BID) | INTRAMUSCULAR | Status: DC

## 2019-10-19 MED ORDER — THIAMINE HCL 100 MG/ML IJ SOLN: 100.0000 mg | Freq: Every day | INTRAMUSCULAR | Status: DC

## 2019-10-19 MED ORDER — BENZTROPINE MESYLATE 1 MG PO TABS: 1.0000 mg | ORAL_TABLET | Freq: Two times a day (BID) | ORAL | Status: DC

## 2019-10-19 MED ORDER — LORAZEPAM 2 MG/ML IJ SOLN: 0.0000 mg | Freq: Four times a day (QID) | INTRAMUSCULAR | Status: DC

## 2019-10-19 MED ORDER — LORAZEPAM 1 MG PO TABS: 0.0000 mg | ORAL_TABLET | Freq: Two times a day (BID) | ORAL | Status: DC

## 2019-10-19 MED ORDER — VITAMIN B-1 100 MG PO TABS
100.0000 mg | ORAL_TABLET | Freq: Every day | ORAL | Status: DC
  Administered 2019-10-19: 100 mg via ORAL
  Filled 2019-10-19: qty 1
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
## 2019-10-19 NOTE — ED Triage Notes (Signed)
PT transported to [facility] via WC  With NT for TTS eval.
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
## 2019-10-19 NOTE — ED Triage Notes (Signed)
Meal ordered

## 2019-10-21 ENCOUNTER — Emergency Department (HOSPITAL_COMMUNITY)
Admission: EM | Admit: 2019-10-21 | Discharge: 2019-10-22 | Disposition: A | Discharge status: Other Acute Inpatient Hospital | Payer: Medicare Other | Attending: Emergency Medicine | Admitting: Emergency Medicine

## 2019-10-21 ENCOUNTER — Encounter (HOSPITAL_COMMUNITY): Payer: Self-pay

## 2019-10-21 ENCOUNTER — Other Ambulatory Visit: Payer: Self-pay

## 2019-10-21 DIAGNOSIS — J45909 Unspecified asthma, uncomplicated: Secondary | ICD-10-CM | POA: Insufficient documentation

## 2019-10-21 DIAGNOSIS — Z20828 Contact with and (suspected) exposure to other viral communicable diseases: Secondary | ICD-10-CM | POA: Insufficient documentation

## 2019-10-21 DIAGNOSIS — F209 Schizophrenia, unspecified: Secondary | ICD-10-CM | POA: Insufficient documentation

## 2019-10-21 DIAGNOSIS — F17210 Nicotine dependence, cigarettes, uncomplicated: Secondary | ICD-10-CM | POA: Insufficient documentation

## 2019-10-21 DIAGNOSIS — F23 Brief psychotic disorder: Secondary | ICD-10-CM

## 2019-10-21 DIAGNOSIS — Z79899 Other long term (current) drug therapy: Secondary | ICD-10-CM | POA: Insufficient documentation

## 2019-10-21 DIAGNOSIS — F203 Undifferentiated schizophrenia: Secondary | ICD-10-CM | POA: Insufficient documentation

## 2019-10-21 LAB — CBC WITH DIFFERENTIAL/PLATELET
Abs Immature Granulocytes: 0.01 10*3/uL (ref 0.00–0.07)
Basophils Absolute: 0.1 10*3/uL (ref 0.0–0.1)
Basophils Relative: 1 %
Eosinophils Absolute: 0.2 10*3/uL (ref 0.0–0.5)
Eosinophils Relative: 3 %
HCT: 32.5 % — ABNORMAL LOW (ref 39.0–52.0)
Hemoglobin: 9.8 g/dL — ABNORMAL LOW (ref 13.0–17.0)
Immature Granulocytes: 0 %
Lymphocytes Relative: 41 %
Lymphs Abs: 2.2 10*3/uL (ref 0.7–4.0)
MCH: 28.9 pg (ref 26.0–34.0)
MCHC: 30.2 g/dL (ref 30.0–36.0)
MCV: 95.9 fL (ref 80.0–100.0)
Monocytes Absolute: 0.5 10*3/uL (ref 0.1–1.0)
Monocytes Relative: 9 %
Neutro Abs: 2.5 10*3/uL (ref 1.7–7.7)
Neutrophils Relative %: 46 %
Platelets: 402 10*3/uL — ABNORMAL HIGH (ref 150–400)
RBC: 3.39 MIL/uL — ABNORMAL LOW (ref 4.22–5.81)
RDW: 14.8 % (ref 11.5–15.5)
WBC: 5.4 10*3/uL (ref 4.0–10.5)
nRBC: 0 % (ref 0.0–0.2)

## 2019-10-21 LAB — COMPREHENSIVE METABOLIC PANEL
ALT: 14 U/L (ref 0–44)
AST: 29 U/L (ref 15–41)
Albumin: 3.5 g/dL (ref 3.5–5.0)
Alkaline Phosphatase: 72 U/L (ref 38–126)
Anion gap: 7 (ref 5–15)
BUN: 13 mg/dL (ref 6–20)
CO2: 29 mmol/L (ref 22–32)
Calcium: 8.9 mg/dL (ref 8.9–10.3)
Chloride: 105 mmol/L (ref 98–111)
Creatinine, Ser: 0.85 mg/dL (ref 0.61–1.24)
GFR calc Af Amer: >60 mL/min (ref >60)
GFR calc non Af Amer: >60 mL/min (ref >60)
Glucose, Bld: 94 mg/dL (ref 70–99)
Potassium: 3.8 mmol/L (ref 3.5–5.1)
Sodium: 141 mmol/L (ref 135–145)
Total Bilirubin: 0.3 mg/dL (ref 0.3–1.2)
Total Protein: 7 g/dL (ref 6.5–8.1)

## 2019-10-21 LAB — ETHANOL: Alcohol, Ethyl (B): <10 mg/dL (ref <10)

## 2019-10-21 MED ORDER — ACETAMINOPHEN 500 MG PO TABS
1000.0000 mg | ORAL_TABLET | Freq: Once | ORAL | Status: AC
  Administered 2019-10-21: 21:00:00 1000 mg via ORAL
  Filled 2019-10-21: qty 2

## 2019-10-21 NOTE — ED Notes (Signed)
Pt A&O x 4, sleeping at present, no distress noted, calm at present.  Monitoring for safety.  Pending TTS assessment.

## 2019-10-21 NOTE — ED Notes (Signed)
Pt made aware urine needed  

## 2019-10-21 NOTE — ED Provider Notes (Signed)
[facility]-EMERGENCY DEPT Provider Note   CSN: [id number] Arrival date & time: 10/21/19  1737     History   Chief Complaint Chief Complaint  Patient presents with  . Psychiatric Evaluation    HPI Peter Wilson is a 43 y.o. male.     43 y.o male with a PMH of Marijuana abuse, Schizophrenia, tardive dyskinesia presents to the ED with a chief complaint of hallucinations.  Patient reports he was rubbed off by his Child psychotherapistsocial worker [place]  He reports he has been without food, shelter for the past couple days.  He reports has been out of medications and feels like he is going to have a psychotic breakdown.  Patient is also talking to other people that are not present in the room.  He reports that he is unable to look at the light as this causes head pain.  He is laughing throughout my whole interview.  Level 5 caveat due to psychiatric behavior  The history is provided by the patient.    Past Medical History:  Diagnosis Date  . Marijuana abuse   . Schizophrenia, paranoid ([facility])   . Tardive dyskinesia   . Tobacco abuse     Patient Active Problem List   Diagnosis Date Noted  . Substance induced mood disorder ([facility]) 10/13/2019  . Aggressive behavior   . Involuntary commitment   . Tremor 04/24/2018  . Schizophrenia ([facility]) 04/01/2018  . Noncompliance 04/10/2017  . Asthma 07/13/2016  . Schizoaffective disorder, bipolar type ([facility]) 06/27/2016  . Antisocial traits 09/15/2015  . Tobacco use disorder 09/14/2015  . Cannabis use disorder, moderate, dependence ([facility]) 09/14/2015    Past Surgical History:  Procedure Laterality Date  . NO PAST SURGERIES          Home Medications    Prior to Admission medications   Medication Sig Start Date End Date Taking? Authorizing Provider  benztropine (COGENTIN) 1 MG tablet Take 1 tablet (1 mg total) by mouth 2 (two) times daily. 04/24/18 06/23/18  Clapacs, Jackquline DenmarkJohn T, MD  chlorproMAZINE (THORAZINE) 200 MG tablet Take 200  mg by mouth 2 (two) times daily.     [provider]  divalproex (DEPAKOTE) 250 MG DR tablet Take 250 mg by mouth 2 (two) times daily. Takes with 500mg  tablet for a total dose of 750mg     [provider]  divalproex (DEPAKOTE) 500 MG DR tablet Take 1 tablet (500 mg total) by mouth every 8 (eight) hours. Patient taking differently: Take 500 mg by mouth 2 (two) times daily. Takes with 250mg  tablet for a total dose of 750mg  04/17/18   [name], [name], MD  haloperidol decanoate (HALDOL DECANOATE) 100 MG/ML injection Inject 2.5 mLs (250 mg total) into the muscle every 21 ( twenty-one) days. Next dose on 4/30 Patient not taking: Reported on 10/13/2019 04/17/18   [name], Braulio ConteJolanta B, MD  traZODone (DESYREL) 50 MG tablet Take 50 mg by mouth at bedtime.    [provider]    Family History History reviewed. No pertinent family history.  Social History Social History   Tobacco Use  . Smoking status: Current Every Day Smoker    Packs/day: 2.00    Types: Cigarettes  . Smokeless tobacco: Never Used  . Tobacco comment: Unknown   Substance Use Topics  . Alcohol use: Yes    Alcohol/week: 0.0 standard drinks    Comment: Unknown   . Drug use: Yes    Frequency: 7.0 times per week  Types: Marijuana    Comment: Unknown      Allergies   Penicillins   Review of Systems Review of Systems  Unable to perform ROS: Psychiatric disorder     Physical Exam Updated Vital Signs BP 126/61 (BP Location: Left Arm)   Pulse 71   Temp 98.2 F (36.8 C) (Oral)   Resp 16   Wt 59 kg   SpO2 97%   BMI 17.63 kg/m   Physical Exam Vitals signs and nursing note reviewed.  HENT:     Mouth/Throat:     Mouth: Mucous membranes are moist.  Eyes:     Pupils: Pupils are equal, round, and reactive to light.  Cardiovascular:     Rate and Rhythm: Normal rate.  Pulmonary:     Effort: Pulmonary effort is normal.  Abdominal:     General: Abdomen is flat.  Neurological:      Mental Status: He is alert. Mental status is at baseline.  Psychiatric:        Mood and Affect: Mood is anxious. Affect is labile.        Speech: Speech is tangential.        Behavior: Behavior is hyperactive.        Thought Content: Thought content is delusional.      ED Treatments / Results  Labs (all labs ordered are listed, but only abnormal results are displayed) Labs Reviewed  CBC WITH DIFFERENTIAL/PLATELET - Abnormal; Notable for the following components:      Result Value   RBC 3.39 (*)    Hemoglobin 9.8 (*)    HCT 32.5 (*)    Platelets 402 (*)    All other components within normal limits  COMPREHENSIVE METABOLIC PANEL  ETHANOL  RAPID URINE DRUG SCREEN, HOSP PERFORMED    EKG None  Radiology No results found.  Procedures Procedures (including critical care time)  Medications Ordered in ED Medications - No data to display   Initial Impression / Assessment and Plan / ED Course  I have reviewed the triage vital signs and the nursing notes.  Pertinent labs & imaging results that were available during my care of the patient were reviewed by me and considered in my medical decision making (see chart for details).       With a past medical history of schizophrenia presents to the ED with complaints of hallucinations.  Patient was dropped off by his Education officer, museum, patient is currently talking in the room with a person that is currently not present.  His speech is rapid and tangential.  His affect is labile.  Vitals are within normal limits, does not report any complaints but difficulty obtaining ROS due to mental state.  Patient was seen in the emergency department 3 days ago for a similar episode.  He is denying SI, HI on today's visit.  He does report he has not been taking his medication for several months.  He reports being on Ativan, Haldol, several other medications to help with his mental state.  Physical exam is reassuring.  Will obtain labs along with TTS  consult for further management of his likely psychosis episode.  CMP is unremarkable.  CBC without any leukocytosis.  Hemoglobin slightly decreased but within his normal limits.  Ethanol level is within normal limit.  TTS consult is currently pending. UDS pending.   Patient care signed out to [facility] at shift change pending TTS.    Portions of this note were generated with Lobbyist. Dictation  errors may occur despite best attempts at proofreading.  Final Clinical Impressions(s) / ED Diagnoses   Final diagnoses:  Acute psychosis [facility])    ED Discharge Orders    None       Claude Manges, Cordelia Poche 10/21/19 2044    Linwood Dibbles, MD 10/23/19 1440

## 2019-10-21 NOTE — ED Notes (Signed)
Pt provided with [place] sandwich per PA

## 2019-10-21 NOTE — ED Notes (Signed)
TTS consult in progress at present. 
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
## 2019-10-21 NOTE — BH Assessment (Addendum)
Pt provided clinician verbal consent to contact the Team Lead of his ACT Team, Alysha, through [facility]. Pt's Team Lead shared the ACT Team has been working with pt for one year; she shares pt had been doing "great" but that then, 2 months ago, "it shifted." Pt's Team Lead states pt had been living with his sister but that then, approximately 2 months ago, they decided pt should move out and pt has been homeless since that time. Pt's Team Lead stated pt is currently unstable, as he hasn't been on his medication for 1 week. She shares pt was d/c from [facility] 2 weeks ago and that he continues to be threatening, delusional, and paranoid. Pt's Team Lead states pt has also come to [facility] ED several times due to mental health concerns but that, as of yet, he is always d/c. She shares that pt has been a nomad the last 2 months and that pt had even gone missing at one point, eventually re-appearing in [place], [place]. Pt's Team Lead states they have attempted to assist pt in finding housing but that pt has been "burning every bridge" every time services are set up for him, including housing at 2 shelters.  Pt's Team Lead shares pt is currently prescribed:  Thorazine, 200mg , 2x/day Depakote DR, 750mg , 2x/day Trazadone, 50mg , at night   Alysha, ACT Team Lead through [facility]: [phone]

## 2019-10-21 NOTE — ED Provider Notes (Signed)
  Physical Exam  BP 126/61 (BP Location: Left Arm)   Pulse 71   Temp 98.2 F (36.8 C) (Oral)   Resp 16   Wt 59 kg   SpO2 97%   BMI 17.63 kg/m   Physical Exam  ED Course/Procedures     Procedures  MDM  Care assumed from [facility] due to change of shift.  43 year old guy with history of schizophrenia and psych admissions in the past presenting to the emergency department for hallucinations.  Patient is currently voluntary.  Patient evaluated by behavioral health counselor and patient will be observed overnight.      Kristine Royal 10/21/19 2311    Lacretia Leigh, MD 10/23/19 1345
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
## 2019-10-21 NOTE — ED Notes (Signed)
Pt immediately requesting food and ativan

## 2019-10-21 NOTE — ED Notes (Signed)
As per TTS Samantha, pt pending overnight observation for re-eval in am.

## 2019-10-22 ENCOUNTER — Encounter (HOSPITAL_COMMUNITY): Payer: Self-pay

## 2019-10-22 ENCOUNTER — Inpatient Hospital Stay (HOSPITAL_COMMUNITY)
Admission: AD | Admit: 2019-10-22 | Discharge: 2019-10-28 | DRG: 885 | Disposition: A | Discharge status: Home / Self Care | Payer: Medicare Other | Source: Intra-hospital | Attending: Psychiatry | Admitting: Psychiatry

## 2019-10-22 ENCOUNTER — Encounter (HOSPITAL_COMMUNITY): Payer: Self-pay | Admitting: Registered Nurse

## 2019-10-22 DIAGNOSIS — Z20828 Contact with and (suspected) exposure to other viral communicable diseases: Secondary | ICD-10-CM | POA: Diagnosis present

## 2019-10-22 DIAGNOSIS — F209 Schizophrenia, unspecified: Secondary | ICD-10-CM | POA: Diagnosis present

## 2019-10-22 DIAGNOSIS — F17210 Nicotine dependence, cigarettes, uncomplicated: Secondary | ICD-10-CM | POA: Diagnosis present

## 2019-10-22 DIAGNOSIS — Z765 Malingerer [conscious simulation]: Secondary | ICD-10-CM | POA: Diagnosis not present

## 2019-10-22 DIAGNOSIS — Z7289 Other problems related to lifestyle: Secondary | ICD-10-CM

## 2019-10-22 DIAGNOSIS — R450 Nervousness: Secondary | ICD-10-CM | POA: Diagnosis not present

## 2019-10-22 DIAGNOSIS — F200 Paranoid schizophrenia: Principal | ICD-10-CM

## 2019-10-22 LAB — RAPID URINE DRUG SCREEN, HOSP PERFORMED
Amphetamines: NOT DETECTED
Barbiturates: NOT DETECTED
Benzodiazepines: NOT DETECTED
Cocaine: NOT DETECTED
Opiates: NOT DETECTED
Tetrahydrocannabinol: POSITIVE — AB

## 2019-10-22 LAB — SARS CORONAVIRUS 2 BY RT PCR (HOSPITAL ORDER, PERFORMED IN ~~LOC~~ HOSPITAL LAB): SARS Coronavirus 2: NEGATIVE

## 2019-10-22 MED ORDER — DIVALPROEX SODIUM 500 MG PO DR TAB
750.0000 mg | DELAYED_RELEASE_TABLET | Freq: Two times a day (BID) | ORAL | Status: DC
  Administered 2019-10-22 – 2019-10-24 (×5): 750 mg via ORAL
  Administered 2019-10-25: 500 mg via ORAL
  Administered 2019-10-25 – 2019-10-28 (×6): 750 mg via ORAL
  Filled 2019-10-22 (×15): qty 1

## 2019-10-22 MED ORDER — TEMAZEPAM 30 MG PO CAPS
30.0000 mg | ORAL_CAPSULE | Freq: Every day | ORAL | Status: DC
  Administered 2019-10-23 – 2019-10-27 (×3): 30 mg via ORAL
  Filled 2019-10-22 (×4): qty 1

## 2019-10-22 MED ORDER — BENZTROPINE MESYLATE 1 MG PO TABS
1.0000 mg | ORAL_TABLET | Freq: Two times a day (BID) | ORAL | Status: DC
  Administered 2019-10-22: 1 mg via ORAL
  Filled 2019-10-22 (×5): qty 1

## 2019-10-22 MED ORDER — CHLORPROMAZINE HCL 100 MG PO TABS
200.0000 mg | ORAL_TABLET | Freq: Two times a day (BID) | ORAL | Status: DC
  Administered 2019-10-22: 200 mg via ORAL
  Filled 2019-10-22: qty 8
  Filled 2019-10-22 (×4): qty 2

## 2019-10-22 MED ORDER — LORAZEPAM 1 MG PO TABS: 2.0000 mg | ORAL_TABLET | ORAL | Status: DC | PRN

## 2019-10-22 MED ORDER — LORAZEPAM 2 MG/ML IJ SOLN: 2.0000 mg | INTRAMUSCULAR | Status: DC | PRN

## 2019-10-22 MED ORDER — TRAZODONE HCL 50 MG PO TABS
50.0000 mg | ORAL_TABLET | Freq: Every day | ORAL | Status: DC
  Filled 2019-10-22: qty 1

## 2019-10-22 NOTE — ED Notes (Signed)
Successfully contacted [facility]. Pt ready to go.

## 2019-10-22 NOTE — ED Notes (Signed)
Transported to [facility] by [employer]. All belongings given to [employer]. Pt was cooperative.
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
## 2019-10-22 NOTE — Discharge Summary (Addendum)
  Patient to be transferred to [facility] for inpatient psychiatric treatment  Attest to NP note 

## 2019-10-22 NOTE — BH Assessment (Signed)
[facility] Assessment Progress Note  Per Hampton Abbot, MD, this pt requires psychiatric hospitalization at this time.  Pt has been assigned pt to [facility] Rm 505-2.  Pt has signed Voluntary Admission and Consent for Treatment, but refuses to sign Consent to Release Information to anyone, and is adamant that the [facility] ACT Team, pt's outpatient provider, not be notified.  Signed form has been faxed to [facility].  Pt's nurse has been notified, and agrees to send original paperwork along with pt via [facility], and to call report to [phone].  Jalene Mullet, [place] Coordinator [phone]

## 2019-10-22 NOTE — Progress Notes (Signed)
Patient ID: Peter Wilson, male   DOB: 17-May-1976, 43 y.o.   MRN: [id number] Patient admitted to the unit due to increased paranoia, auditory hallucinations and homelessness.  Patient found to be free of all self harm an contraband.  Patient admitted to the unit without incident.

## 2019-10-22 NOTE — ED Notes (Signed)
Spoke with Edd Arbour RN who asked that pt wait for about a hour before transport because their admission room is backed up. Edd Arbour will call when the room is ready.

## 2019-10-22 NOTE — ED Notes (Signed)
Tried to Research officer, political party - no answer.

## 2019-10-22 NOTE — Tx Team (Signed)
Initial Treatment Plan 10/22/2019 6:55 PM [name] B[facility]:[id number]    PATIENT STRESSORS: Medication change or noncompliance   PATIENT STRENGTHS: Capable of independent living   PATIENT IDENTIFIED PROBLEMS: Patient is hearing voices  Patient has no living arrangements                   DISCHARGE CRITERIA:  Safe-care adequate arrangements made  PRELIMINARY DISCHARGE PLAN: Placement in alternative living arrangements  PATIENT/FAMILY INVOLVEMENT: This treatment plan has been presented to and reviewed with the patient, Peter Wilson, and/or family member. The patient and family have been given the opportunity to ask questions and make suggestions.  Clarita Crane, RN 10/22/2019, 6:55 PM

## 2019-10-23 DIAGNOSIS — F200 Paranoid schizophrenia: Secondary | ICD-10-CM | POA: Diagnosis not present

## 2019-10-23 MED ORDER — HALOPERIDOL LACTATE 5 MG/ML IJ SOLN: 10.0000 mg | Freq: Four times a day (QID) | INTRAMUSCULAR | Status: DC | PRN

## 2019-10-23 MED ORDER — HALOPERIDOL 5 MG PO TABS
10.0000 mg | ORAL_TABLET | Freq: Four times a day (QID) | ORAL | Status: DC | PRN
  Administered 2019-10-23 – 2019-10-26 (×3): 10 mg via ORAL
  Filled 2019-10-23 (×4): qty 2

## 2019-10-23 MED ORDER — DIPHENHYDRAMINE HCL 50 MG/ML IJ SOLN
50.0000 mg | Freq: Four times a day (QID) | INTRAMUSCULAR | Status: DC | PRN
  Administered 2019-10-25: 50 mg via INTRAMUSCULAR
  Filled 2019-10-23: qty 1

## 2019-10-23 MED ORDER — CHLORPROMAZINE HCL 50 MG PO TABS
200.0000 mg | ORAL_TABLET | Freq: Three times a day (TID) | ORAL | Status: DC
  Administered 2019-10-23 – 2019-10-24 (×3): 200 mg via ORAL
  Filled 2019-10-23 (×6): qty 4

## 2019-10-23 NOTE — BHH Group Notes (Signed)
[facility] [name] Group Therapy Note   Date and Time: 10/23/2019 @ 1:30pm  Type of Group and Topic: Psychoeducational Group: Discharge Planning and Individual Wellness  Participation Level: [facility] PARTICIPATION LEVEL: Active    Description of Group: Discharge planning group reviews patient's anticipated discharge plans and assists patients to anticipate and address any barriers to wellness/recovery in the community. Suicide prevention education is reviewed with patients in group. Therapeutic Goals 1. Patients will state their anticipated discharge plan and mental health aftercare 2. Patients will identify potential barriers to wellness in the community setting 3. Patients will engage in problem solving, solution focused discussion of ways to anticipate and address barriers to wellness/recovery     Summary of Patient Progress:  Patient was active and engaged throughout group therapy today. Patient was talkative throughout group therapy. Patient talked about the barriers of his ACTT team, not having housing, and being hospitalized. Patient stated that Beverly Sessions helped him get some help and get stabilized at the hospital. Patient got upset during group and took a few minutes to go walk to calm himself down before coming back to the group. Patient was pleasant throughout.    Plan for Discharge/Comments:  Patient is not sure where he is going to go post discharge. Patient wants to meet with the CSW to discuss that.     Transportation Means:   Pending   Supports:  Some family members      Therapeutic Modalities: Motivational Interviewing and Psycho-Education

## 2019-10-23 NOTE — Progress Notes (Signed)
   10/23/19 2200  Psych Admission Type (Psych Patients Only)  Admission Status Voluntary  Psychosocial Assessment  Patient Complaints Worrying;Agitation;Irritability  Eye Contact Fair  Facial Expression Anxious  Affect Anxious;Irritable;Preoccupied  Speech Pressured;Loud  Interaction Guarded;Avoidant  Motor Activity Slow  Appearance/Hygiene Disheveled  Behavior Characteristics Anxious;Agressive verbally;Agitated  Mood Labile;Anxious  Aggressive Behavior  Effect No apparent injury  Thought Process  Coherency Disorganized;Flight of ideas;Loose associations  Content Delusions;Blaming others  Delusions Persecutory  Perception Depersonalization  Hallucination Auditory  Judgment Poor  Confusion WDL  Danger to Self  Current suicidal ideation? Denies  Danger to Others  Danger to Others None reported or observed   D: Pt denies SI/HI/AVH. Pt irritable this evening getting worked up, aggressive verbally and threatening. Pt given night medication early, and PRN Haldol per MAR.   A: Pt was offered support and encouragement. Pt was given scheduled medications. Pt was encourage to attend groups. Q 15 minute checks were done for safety.   R: safety maintained on unit.
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
## 2019-10-23 NOTE — H&P (Signed)
Psychiatric Admission Assessment Adult  Patient Identification: Peter Wilson MRN:  [id number] Date of Evaluation:  10/23/2019 Chief Complaint:  PSYCHOTIC DISORDER Principal Diagnosis: Exacerbation of psychotic disorder complicated by poor compliance and substance abuse Diagnosis:  Active Problems:   Schizophrenia ([place])  History of Present Illness:   This is the latest of multiple encounters with our healthcare system for Peter Wilson, he is 48, this admission was initiated on 10/27 however he had presented to the emergency department on 10/21 later on 10/24 and 25 with intoxication.  The patient is normally followed by his act team, [facility], and he has been with him approximately a year.  About 2 months ago he moved out of his sister's home and has been homeless since that time, off of his medication.  He was recently at [facility] discharge approximately 2 weeks ago but continued to be threatening, delusional and paranoid according to the assessment team notes.  On my interview he is manic pressured and rambling very much seeking benzodiazepine stating "I been on Klonopin since I was in prison" and "I need Ativan"-however current med list only includes Thorazine 400 mg a day, Depakote 750 mg twice daily and trazodone 50 mg at bedtime. Clearly he is a candidate for long-acting injectable medication  Currently alert oriented pressured and rambling drug-seeking knows the date but not the date knows the month and year is somewhat paranoid and talks in disparaging terms about healthcare providers current and past but no obvious delusional material expressed and denies auditory or visual hallucinations denies thoughts of harming self or others/drug screen positive for cannabis  Associated Signs/Symptoms: Depression Symptoms:  psychomotor agitation, (Hypo) Manic Symptoms:  Delusions, Distractibility, Anxiety Symptoms:  Drug-seeking behaviors Psychotic Symptoms:  Delusions, PTSD  Symptoms: NA Total Time spent with patient: 45 minutes  Past Psychiatric History: Multiple admissions even earlier in the month at [facility] but was discharged typically undertreated and not stable  Is the patient at risk to self? Yes.    Has the patient been a risk to self in the past 6 months? No.  Has the patient been a risk to self within the distant past? Yes.    Is the patient a risk to others? No.  Has the patient been a risk to others in the past 6 months? No.  Has the patient been a risk to others within the distant past? Yes.     Prior Inpatient Therapy:  Multiple admissions Prior Outpatient Therapy:  Currently acting patient  Alcohol Screening: 1. How often do you have a drink containing alcohol?: Never 2. How many drinks containing alcohol do you have on a typical day when you are drinking?: 1 or 2 3. How often do you have six or more drinks on one occasion?: Never AUDIT-C Score: 0 4. How often during the last year have you found that you were not able to stop drinking once you had started?: Never 5. How often during the last year have you failed to do what was normally expected from you becasue of drinking?: Never 6. How often during the last year have you needed a first drink in the morning to get yourself going after a heavy drinking session?: Never 7. How often during the last year have you had a feeling of guilt of remorse after drinking?: Never 8. How often during the last year have you been unable to remember what happened the night before because you had been drinking?: Never 9. Have you or someone else been  injured as a result of your drinking?: No 10. Has a relative or friend or a doctor or another health worker been concerned about your drinking or suggested you cut down?: No Alcohol Use Disorder Identification Test Final Score (AUDIT): 0 Substance Abuse History in the last 12 months:  Yes.   Consequences of Substance Abuse: Medical Consequences:  Prompting ER  visits while intoxicated Previous Psychotropic Medications: Yes  Psychological Evaluations: No  Past Medical History:  Past Medical History:  Diagnosis Date  . Marijuana abuse   . Schizophrenia, paranoid (HCC)   . Tardive dyskinesia   . Tobacco abuse     Past Surgical History:  Procedure Laterality Date  . NO PAST SURGERIES     Family History: History reviewed. No pertinent family history. Family Psychiatric  History: Patient denies Tobacco Screening:   Social History:  Social History   Substance and Sexual Activity  Alcohol Use Yes  . Alcohol/week: 0.0 standard drinks   Comment: Unknown      Social History   Substance and Sexual Activity  Drug Use Yes  . Frequency: 7.0 times per week  . Types: Marijuana   Comment: Unknown     Additional Social History:                           Allergies:   Allergies  Allergen Reactions  . Penicillins Rash    Did it involve swelling of the face/tongue/throat, SOB, or low BP? U Did it involve sudden or severe rash/hives, skin peeling, or any reaction on the inside of your mouth or nose? Y  Did you need to seek medical attention at a hospital or doctor's office? Y When did it last happen?childhood If all above answers are "NO", may proceed with cephalosporin use.    Lab Results:  Results for orders placed or performed during the hospital encounter of 10/21/19 (from the past 48 hour(s))  Comprehensive metabolic panel     Status: None   Collection Time: 10/21/19  6:17 PM  Result Value Ref Range   Sodium 141 135 - 145 mmol/L   Potassium 3.8 3.5 - 5.1 mmol/L   Chloride 105 98 - 111 mmol/L   CO2 29 22 - 32 mmol/L   Glucose, Bld 94 70 - 99 mg/dL   BUN 13 6 - 20 mg/dL   Creatinine, Ser 4.74 0.61 - 1.24 mg/dL   Calcium 8.9 8.9 - 25.9 mg/dL   Total Protein 7.0 6.5 - 8.1 g/dL   Albumin 3.5 3.5 - 5.0 g/dL   AST 29 15 - 41 U/L   ALT 14 0 - 44 U/L   Alkaline Phosphatase 72 38 - 126 U/L   Total Bilirubin 0.3 0.3 - 1.2  mg/dL   GFR calc non Af Amer >60 >60 mL/min   GFR calc Af Amer >60 >60 mL/min   Anion gap 7 5 - 15    Comment: Performed at [facility], [address], [place], [place] [address]  Ethanol     Status: None   Collection Time: 10/21/19  6:17 PM  Result Value Ref Range   Alcohol, Ethyl (B) <10 <10 mg/dL    Comment: (NOTE) Lowest detectable limit for serum alcohol is 10 mg/dL. For medical purposes only. Performed at [facility], [address], [place], [place] [address]   CBC with Diff     Status: Abnormal   Collection Time: 10/21/19  6:17 PM  Result  Value Ref Range   WBC 5.4 4.0 - 10.5 K/uL   RBC 3.39 (L) 4.22 - 5.81 MIL/uL   Hemoglobin 9.8 (L) 13.0 - 17.0 g/dL   HCT 16.1 (L) 09.6 - 04.5 %   MCV 95.9 80.0 - 100.0 fL   MCH 28.9 26.0 - 34.0 pg   MCHC 30.2 30.0 - 36.0 g/dL   RDW 40.9 81.1 - 91.4 %   Platelets 402 (H) 150 - 400 K/uL   nRBC 0.0 0.0 - 0.2 %   Neutrophils Relative % 46 %   Neutro Abs 2.5 1.7 - 7.7 K/uL   Lymphocytes Relative 41 %   Lymphs Abs 2.2 0.7 - 4.0 K/uL   Monocytes Relative 9 %   Monocytes Absolute 0.5 0.1 - 1.0 K/uL   Eosinophils Relative 3 %   Eosinophils Absolute 0.2 0.0 - 0.5 K/uL   Basophils Relative 1 %   Basophils Absolute 0.1 0.0 - 0.1 K/uL   Immature Granulocytes 0 %   Abs Immature Granulocytes 0.01 0.00 - 0.07 K/uL    Comment: Performed at [facility], [address], [place], [place] [address]  Urine rapid drug screen (hosp performed)     Status: Abnormal   Collection Time: 10/22/19  6:31 AM  Result Value Ref Range   Opiates NONE DETECTED NONE DETECTED   Cocaine NONE DETECTED NONE DETECTED   Benzodiazepines NONE DETECTED NONE DETECTED   Amphetamines NONE DETECTED NONE DETECTED   Tetrahydrocannabinol POSITIVE (A) NONE DETECTED   Barbiturates NONE DETECTED NONE DETECTED    Comment: (NOTE) DRUG SCREEN FOR MEDICAL PURPOSES ONLY.  IF CONFIRMATION IS NEEDED FOR ANY PURPOSE, NOTIFY  LAB WITHIN 5 DAYS. LOWEST DETECTABLE LIMITS FOR URINE DRUG SCREEN Drug Class                     Cutoff (ng/mL) Amphetamine and metabolites    1000 Barbiturate and metabolites    200 Benzodiazepine                 200 Tricyclics and metabolites     300 Opiates and metabolites        300 Cocaine and metabolites        300 THC                            50 Performed at [facility], [address], [place], [place] [address]   SARS Coronavirus 2 by RT PCR (hospital order, performed in [facility] hospital lab) Nasopharyngeal Nasopharyngeal Swab     Status: None   Collection Time: 10/22/19 10:35 AM   Specimen: Nasopharyngeal Swab  Result Value Ref Range   SARS Coronavirus 2 NEGATIVE NEGATIVE    Comment: (NOTE) If result is NEGATIVE SARS-CoV-2 target nucleic acids are NOT DETECTED. The SARS-CoV-2 RNA is generally detectable in upper and lower  respiratory specimens during the acute phase of infection. The lowest  concentration of SARS-CoV-2 viral copies this assay can detect is 250  copies / mL. A negative result does not preclude SARS-CoV-2 infection  and should not be used as the sole basis for treatment or other  patient management decisions.  A negative result may occur with  improper specimen collection / handling, submission of specimen other  than nasopharyngeal swab, presence of viral mutation(s) within the  areas targeted by this assay, and inadequate number of viral copies  (<250 copies / mL). A negative result must  be combined with clinical  observations, patient history, and epidemiological information. If result is POSITIVE SARS-CoV-2 target nucleic acids are DETECTED. The SARS-CoV-2 RNA is generally detectable in upper and lower  respiratory specimens dur ing the acute phase of infection.  Positive  results are indicative of active infection with SARS-CoV-2.  Clinical  correlation with patient history and other diagnostic information is  necessary  to determine patient infection status.  Positive results do  not rule out bacterial infection or co-infection with other viruses. If result is PRESUMPTIVE POSTIVE SARS-CoV-2 nucleic acids MAY BE PRESENT.   A presumptive positive result was obtained on the submitted specimen  and confirmed on repeat testing.  While 2019 novel coronavirus  (SARS-CoV-2) nucleic acids may be present in the submitted sample  additional confirmatory testing may be necessary for epidemiological  and / or clinical management purposes  to differentiate between  SARS-CoV-2 and other Sarbecovirus currently known to infect humans.  If clinically indicated additional testing with an alternate test  methodology [phone]) is advised. The SARS-CoV-2 RNA is generally  detectable in upper and lower respiratory sp ecimens during the acute  phase of infection. The expected result is Negative. Fact Sheet for Patients:  BoilerBrush.com.cy Fact Sheet for Healthcare Providers: https://pope.com/ This test is not yet approved or cleared by the [place] FDA and has been authorized for detection and/or diagnosis of SARS-CoV-2 by FDA under an Emergency Use Authorization (EUA).  This EUA will remain in effect (meaning this test can be used) for the duration of the COVID-19 declaration under Section 564(b)(1) of the Act, 21 U.S.C. section 360bbb-3(b)(1), unless the authorization is terminated or revoked sooner. Performed at [facility], [address], [place], [place] [address]     Blood Alcohol level:  Lab Results  Component Value Date   ETH <10 10/21/2019   ETH 14 (H) 10/18/2019    Metabolic Disorder Labs:  Lab Results  Component Value Date   HGBA1C 4.6 (L) 04/02/2018   MPG 85.32 04/02/2018   MPG 91 04/11/2017   Lab Results  Component Value Date   PROLACTIN 26.4 (H) 04/11/2017   PROLACTIN 40.2 (H) 01/27/2016   Lab Results  Component Value  Date   CHOL 202 (H) 04/02/2018   TRIG 102 04/02/2018   HDL 46 04/02/2018   CHOLHDL 4.4 04/02/2018   VLDL 20 04/02/2018   LDLCALC 136 (H) 04/02/2018   LDLCALC 167 (H) 04/11/2017    Current Medications: Current Facility-Administered Medications  Medication Dose Route Frequency Provider Last Rate Last Dose  . benztropine (COGENTIN) tablet 1 mg  1 mg Oral BID Rankin, Shuvon B, NP   1 mg at 10/22/19 1742  . chlorproMAZINE (THORAZINE) tablet 200 mg  200 mg Oral BID Rankin, Shuvon B, NP   200 mg at 10/22/19 1743  . divalproex (DEPAKOTE) DR tablet 750 mg  750 mg Oral BID Rankin, Shuvon B, NP   750 mg at 10/22/19 1743  . LORazepam (ATIVAN) tablet 2 mg  2 mg Oral Q4H PRN Malvin Johns, MD       Or  . LORazepam (ATIVAN) injection 2 mg  2 mg Intramuscular Q4H PRN Malvin Johns, MD      . temazepam (RESTORIL) capsule 30 mg  30 mg Oral QHS Malvin Johns, MD       PTA Medications: Medications Prior to Admission  Medication Sig Dispense Refill Last Dose  . benztropine (COGENTIN) 1 MG tablet Take 1 tablet (1 mg total) by mouth 2 (two)  times daily. 60 tablet 1   . chlorproMAZINE (THORAZINE) 200 MG tablet Take 200 mg by mouth 2 (two) times daily.      . divalproex (DEPAKOTE) 250 MG DR tablet Take 250 mg by mouth 2 (two) times daily. Takes with 500mg  tablet for a total dose of 750mg      . divalproex (DEPAKOTE) 500 MG DR tablet Take 1 tablet (500 mg total) by mouth every 8 (eight) hours. (Patient taking differently: Take 500 mg by mouth 2 (two) times daily. Takes with 250mg  tablet for a total dose of 750mg ) 90 tablet 1   . haloperidol decanoate (HALDOL DECANOATE) 100 MG/ML injection Inject 2.5 mLs (250 mg total) into the muscle every 21 ( twenty-one) days. Next dose on 4/30 (Patient not taking: Reported on 10/13/2019) 2.5 mL 1   . traZODone (DESYREL) 50 MG tablet Take 50 mg by mouth at bedtime.       Musculoskeletal: Strength & Muscle Tone: within normal limits Gait & Station: normal Patient leans:  N/A  Psychiatric Specialty Exam: Physical Exam  Nursing note and vitals reviewed. Constitutional: He appears well-developed and well-nourished.  Cardiovascular: Normal rate and regular rhythm.    Review of Systems  Constitutional: Negative.   Eyes: Negative.   Respiratory: Negative.   Cardiovascular: Negative.   Genitourinary: Negative.   Neurological: Negative.   Endo/Heme/Allergies: Negative.     Blood pressure 105/73, pulse 74, temperature 97.6 F (36.4 C), temperature source Oral, resp. rate 18, height 6' (1.829 m), weight 68 kg, SpO2 100 %.Body mass index is 20.34 kg/m.  General Appearance: Casual  Eye Contact:  Fair  Speech:  Pressured  Volume:  Increased  Mood:  manic  Affect:  Full Range  Thought Process:  Irrelevant and Descriptions of Associations: Loose  Orientation:  Full (Time, Place, and Person)  Thought Content:  Illogical and Delusions  Suicidal Thoughts:  No  Homicidal Thoughts:  No  Memory:  Immediate;   Poor Recent;   Poor Remote;   Fair  Judgement:  Impaired  Insight:  Lacking  Psychomotor Activity:  Increased  Concentration:  Concentration: Fair and Attention Span: Poor  Recall:  Poor  Fund of Knowledge:  Fair  Language:  Good  Akathisia:  Negative  Handed:  Right  AIMS (if indicated):     Assets:  Physical Health Resilience  ADL's:  Intact  Cognition:  WNL  Sleep:  Number of Hours: 8.75    Treatment Plan Summary: Daily contact with patient to assess and evaluate symptoms and progress in treatment and Medication management  Observation Level/Precautions:  15 minute checks  Laboratory:  UDS  Psychotherapy: Reality based  Medications: Needs long-acting injectable  Consultations: None necessary  Discharge Concerns: Housing and longer term stability and compliance  Estimated LOS: 7-10  Other: 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 alcohol abuse   Physician Treatment Plan for Primary Diagnosis: Schizophrenia  chronic paranoid type cannabis and alcohol abuse We will contact act team  Long Term Goal(s): Improvement in symptoms so as ready for discharge  Short Term Goals: Ability to verbalize feelings will improve, Ability to disclose and discuss suicidal ideas, Ability to demonstrate self-control will improve, Ability to identify and develop effective coping behaviors will improve, Ability to maintain clinical measurements within normal limits will improve and Compliance with prescribed medications will improve  Physician Treatment Plan for Secondary Diagnosis: Active Problems:   Schizophrenia (HCC)  Long Term Goal(s): Improvement in symptoms so as ready for discharge  Short Term Goals:  Ability to demonstrate self-control will improve, Ability to identify and develop effective coping behaviors will improve, Ability to maintain clinical measurements within normal limits will improve and Compliance with prescribed medications will improve  I certify that inpatient services furnished can reasonably be expected to improve the patient's condition.    Malvin Johns, MD 10/29/20207:54 AM

## 2019-10-23 NOTE — Progress Notes (Signed)
Recreation Therapy Notes  Date: 10.29.20 Time: 1000 Location: 500 Hall Dayroom  Group Topic: Leisure Education  Goal Area(s) Addresses:  Patient will identify positive leisure activities.  Patient will identify one positive benefit of participation in leisure activities.   Intervention: Leisure Group Game  Activity: Pictionary.  Patients were split into 2 groups.  One person from the group would pick a word from the container and draw it on the board.  Their team has one minute to guess what the picture is.  If they guess correctly, they get the point.  If they don't guess it, the other team gets a chance to steal the point.  Education:  Leisure Education, Dentist  Education Outcome: Acknowledges education/In group clarification offered/Needs additional education  Clinical Observations/Feedback: Pt did not attend group session.    Victorino Sparrow, LRT/CTRS         Victorino Sparrow A 10/23/2019 11:57 AM

## 2019-10-23 NOTE — Progress Notes (Signed)
Recreation Therapy Notes  10.29.20  LRT attempted to conduct recreation therapy assessment with patient.  Pt was frustrated and agitated because "them people just left me here with no way to food or nothing".  Pt became tearful when talking.  Pt was talking about how all his family is deceased and he feels like God left him here to suffer.  Pt was becoming more agitated and frustrated.  LRT will attempt to conduct assessment at a later time.   Victorino Sparrow, LRT/CTRS      Victorino Sparrow A 10/23/2019 12:14 PM

## 2019-10-24 DIAGNOSIS — F200 Paranoid schizophrenia: Secondary | ICD-10-CM | POA: Diagnosis not present

## 2019-10-24 MED ORDER — LORAZEPAM 1 MG PO TABS
4.0000 mg | ORAL_TABLET | Freq: Once | ORAL | Status: AC
  Administered 2019-10-24: 4 mg via ORAL
  Filled 2019-10-24: qty 4

## 2019-10-24 MED ORDER — BENZTROPINE MESYLATE 1 MG PO TABS
1.0000 mg | ORAL_TABLET | Freq: Two times a day (BID) | ORAL | Status: DC
  Administered 2019-10-24 – 2019-10-28 (×8): 1 mg via ORAL
  Filled 2019-10-24 (×11): qty 1

## 2019-10-24 MED ORDER — GABAPENTIN 300 MG PO CAPS
600.0000 mg | ORAL_CAPSULE | Freq: Three times a day (TID) | ORAL | Status: DC
  Administered 2019-10-24 – 2019-10-25 (×4): 600 mg via ORAL
  Administered 2019-10-26: 300 mg via ORAL
  Administered 2019-10-27: 600 mg via ORAL
  Filled 2019-10-24 (×14): qty 2

## 2019-10-24 MED ORDER — LORAZEPAM 2 MG/ML IJ SOLN: 4.0000 mg | Freq: Once | INTRAMUSCULAR | Status: DC

## 2019-10-24 MED ORDER — PALIPERIDONE PALMITATE ER 234 MG/1.5ML IM SUSY
234.0000 mg | PREFILLED_SYRINGE | Freq: Once | INTRAMUSCULAR | Status: AC
  Administered 2019-10-24: 234 mg via INTRAMUSCULAR
  Filled 2019-10-24: qty 1.5

## 2019-10-24 MED ORDER — RISPERIDONE 3 MG PO TABS
3.0000 mg | ORAL_TABLET | Freq: Three times a day (TID) | ORAL | Status: DC
  Administered 2019-10-24 – 2019-10-28 (×12): 3 mg via ORAL
  Filled 2019-10-24 (×16): qty 1

## 2019-10-24 MED ORDER — HALOPERIDOL 5 MG PO TABS: 10.0000 mg | ORAL_TABLET | Freq: Three times a day (TID) | ORAL | Status: DC

## 2019-10-24 NOTE — Progress Notes (Signed)
Pt stated he felt good about being here, " I've never been around as many positive as I have since I've been here"
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
## 2019-10-24 NOTE — Plan of Care (Signed)
Became irritable and agitated in the afternoon, threatening staff. Yelling and resistant to care. Had Ativan 4mg  as prescribed.

## 2019-10-24 NOTE — Progress Notes (Signed)
Pt remembered Probation officer from [facility] and stated he wanted Probation officer to help the other patients in [place].

## 2019-10-24 NOTE — Care Management (Signed)
CMA sent ACTT referral to [facility]. According to Pace, Ardelle Anton, patient has ACTT with [facility]. However, patient wants ACTT with [facility] instead.   [facility] has declined ACTT services for patient due to insurance. Patient does not have Medicaid. Intake at [facility] recommended [facility] and [facility] for ACTT services for patient.    CMA will notify CSW.       Doxie Augenstein Care Management Assistant  Email:[email or web address] Office: [phone]

## 2019-10-24 NOTE — Progress Notes (Signed)
Recreation Therapy Notes  INPATIENT RECREATION THERAPY ASSESSMENT  Patient Details Name: Lamarcus Spira MRN: [id number] DOB: 01-26-1976 Today's Date: 10/24/2019       Information Obtained From: Patient  Able to Participate in Assessment/Interview: Yes  Patient Presentation: Alert  Reason for Admission (Per Patient): Other (Comments)(Pt stated he had issues.)  Patient Stressors: Other (Comment)(Housing; Food)  Coping Skills:   Isolation, Write, Sports, TV, Arguments, Music, Exercise, Meditate, Substance Abuse, Talk, Prayer, Avoidance  Leisure Interests (2+):  Individual - Other (Comment), Music - Listen(Ride around)  Frequency of Recreation/Participation: Weekly  Awareness of Community Resources:  No  Expressed Interest in [place]: No  County of Residence:  [place]urance under[place]  Patient Main Form of Transportation: Walk  Patient Strengths:  Civil Service fast streamer; Love to make music  Patient Identified Areas of Improvement:  Picking friends; Dealing with the wrong people  Patient Goal for Hospitalization:  "I want everything I had back and this is the only way I can get it back"  Current SI (including self-harm):  No  Current HI:  No  Current AVH: No  Staff Intervention Plan: Group Attendance, Collaborate with Interdisciplinary Treatment Team  Consent to Intern Participation: N/A    Victorino Sparrow, LRT/CTRS  Victorino Sparrow A 10/24/2019, 12:43 PM

## 2019-10-24 NOTE — Progress Notes (Signed)
Recreation Therapy Notes  Date: 10.30.20 Time: 1000 Location:  500 Hall Dayroom  Group Topic: Communication, Team Building, Problem Solving  Goal Area(s) Addresses:  Patient will effectively work with peer towards shared goal.  Patient will identify skills used to make activity successful.  Patient will identify how skills used during activity can be used to reach post d/c goals.   Intervention: STEM Activity  Activity: Straw Bridge.  Patients were divided into groups.  Patients were given 15 straws and a long piece of masking tape.  Patients were to build an elevated bridge that could hold a small puzzle box.  Education:Social Skills, Discharge Planning   Education Outcome: Acknowledges education/In group clarification offered/Needs additional education.   Clinical Observations/Feedback:  Pt did not attend group.    Victorino Sparrow, LRT/CTRS    Victorino Sparrow A 10/24/2019 12:18 PM
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
## 2019-10-24 NOTE — Progress Notes (Signed)
Patient ID: Peter Wilson, male   DOB: 03-Jan-1976, 43 y.o.   MRN: [id number]   CSW met with patient to discuss his social security, housing, and after plan steps. Patient was agarviated because he was not being seen.  CSW sat with patient while he called social security about his checks. Patient grew upset with the social security represensatives but was easily redirected.   CSW printed out several lists of housing options for the patient and left for the weekend social worker to give those to the patient.
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
## 2019-10-24 NOTE — Progress Notes (Signed)
   10/24/19 2100  Psych Admission Type (Psych Patients Only)  Admission Status Voluntary  Psychosocial Assessment  Patient Complaints Anxiety;Agitation  Eye Contact Fair  Facial Expression Anxious  Affect Anxious;Irritable;Preoccupied  Speech Pressured;Loud  Interaction Guarded;Avoidant  Motor Activity Slow  Appearance/Hygiene Disheveled  Behavior Characteristics Agressive verbally;Agitated;Anxious;Irritable;Intrusive  Mood Angry;Labile;Anxious  Thought Process  Coherency Disorganized;Flight of ideas;Loose associations  Content Delusions;Blaming others  Delusions Persecutory  Perception Depersonalization  Hallucination Auditory  Judgment Poor  Confusion WDL  Danger to Self  Current suicidal ideation? Denies  Danger to Others  Danger to Others None reported or observed   Pt agitated this evening. Pt continued to express his displeasure with his roommate. Pt request his medications early so he could fall asleep before the roommate comes in the room. Pt has no insight into his anger, but pt does understand there is an issue. Pt confides in Probation officer and stated he felt happy when Probation officer was working. Pt seen pacing the unit this evening and irritable.

## 2019-10-24 NOTE — Tx Team (Signed)
Interdisciplinary Treatment and Diagnostic Plan Update  10/24/2019 Time of Session: 2:40 pm  Peter Wilson MRN: [id number]  Principal Diagnosis: <principal problem not specified>  Secondary Diagnoses: Active Problems:   Schizophrenia (HCC)   Current Medications:  Current Facility-Administered Medications  Medication Dose Route Frequency Provider Last Rate Last Dose  . benztropine (COGENTIN) tablet 1 mg  1 mg Oral BID Malvin JohnsFarah, Brian, MD   1 mg at 10/24/19 1444  . diphenhydrAMINE (BENADRYL) injection 50 mg  50 mg Intramuscular Q6H PRN Malvin JohnsFarah, Brian, MD      . divalproex (DEPAKOTE) DR tablet 750 mg  750 mg Oral BID Rankin, Shuvon B, NP   750 mg at 10/24/19 0824  . gabapentin (NEURONTIN) capsule 600 mg  600 mg Oral TID Malvin JohnsFarah, Brian, MD      . haloperidol (HALDOL) tablet 10 mg  10 mg Oral Q6H PRN Malvin JohnsFarah, Brian, MD   10 mg at 10/23/19 1958   Or  . haloperidol lactate (HALDOL) injection 10 mg  10 mg Intramuscular Q6H PRN Malvin JohnsFarah, Brian, MD      . LORazepam (ATIVAN) injection 2 mg  2 mg Intramuscular Q4H PRN Malvin JohnsFarah, Brian, MD      . LORazepam (ATIVAN) injection 4 mg  4 mg Intramuscular Once Malvin JohnsFarah, Brian, MD      . risperiDONE (RISPERDAL) tablet 3 mg  3 mg Oral TID Malvin JohnsFarah, Brian, MD   3 mg at 10/24/19 1312  . temazepam (RESTORIL) capsule 30 mg  30 mg Oral QHS Malvin JohnsFarah, Brian, MD   30 mg at 10/23/19 1959   PTA Medications: Medications Prior to Admission  Medication Sig Dispense Refill Last Dose  . benztropine (COGENTIN) 1 MG tablet Take 1 tablet (1 mg total) by mouth 2 (two) times daily. 60 tablet 1   . chlorproMAZINE (THORAZINE) 200 MG tablet Take 200 mg by mouth 2 (two) times daily.      . divalproex (DEPAKOTE) 250 MG DR tablet Take 250 mg by mouth 2 (two) times daily. Takes with 500mg  tablet for a total dose of 750mg      . divalproex (DEPAKOTE) 500 MG DR tablet Take 1 tablet (500 mg total) by mouth every 8 (eight) hours. (Patient taking differently: Take 500 mg by mouth 2 (two) times daily. Takes with  250mg  tablet for a total dose of 750mg ) 90 tablet 1   . haloperidol decanoate (HALDOL DECANOATE) 100 MG/ML injection Inject 2.5 mLs (250 mg total) into the muscle every 21 ( twenty-one) days. Next dose on 4/30 (Patient not taking: Reported on 10/13/2019) 2.5 mL 1   . traZODone (DESYREL) 50 MG tablet Take 50 mg by mouth at bedtime.       Patient Stressors: Medication change or noncompliance  Patient Strengths: Capable of independent living  Treatment Modalities: Medication Management, Group therapy, Case management,  1 to 1 session with clinician, Psychoeducation, Recreational therapy.   Physician Treatment Plan for Primary Diagnosis: <principal problem not specified> Long Term Goal(s): Improvement in symptoms so as ready for discharge Improvement in symptoms so as ready for discharge   Short Term Goals: Ability to verbalize feelings will improve Ability to disclose and discuss suicidal ideas Ability to demonstrate self-control will improve Ability to identify and develop effective coping behaviors will improve Ability to maintain clinical measurements within normal limits will improve Compliance with prescribed medications will improve Ability to demonstrate self-control will improve Ability to identify and develop effective coping behaviors will improve Ability to maintain clinical measurements within normal limits will improve Compliance with  prescribed medications will improve  Medication Management: Evaluate patient's response, side effects, and tolerance of medication regimen.  Therapeutic Interventions: 1 to 1 sessions, Unit Group sessions and Medication administration.  Evaluation of Outcomes: Progressing  Physician Treatment Plan for Secondary Diagnosis: Active Problems:   Schizophrenia (HCC)  Long Term Goal(s): Improvement in symptoms so as ready for discharge Improvement in symptoms so as ready for discharge   Short Term Goals: Ability to verbalize feelings will  improve Ability to disclose and discuss suicidal ideas Ability to demonstrate self-control will improve Ability to identify and develop effective coping behaviors will improve Ability to maintain clinical measurements within normal limits will improve Compliance with prescribed medications will improve Ability to demonstrate self-control will improve Ability to identify and develop effective coping behaviors will improve Ability to maintain clinical measurements within normal limits will improve Compliance with prescribed medications will improve     Medication Management: Evaluate patient's response, side effects, and tolerance of medication regimen.  Therapeutic Interventions: 1 to 1 sessions, Unit Group sessions and Medication administration.  Evaluation of Outcomes: Progressing   RN Treatment Plan for Primary Diagnosis: <principal problem not specified> Long Term Goal(s): Knowledge of disease and therapeutic regimen to maintain health will improve  Short Term Goals: Ability to participate in decision making will improve, Ability to verbalize feelings will improve, Ability to disclose and discuss suicidal ideas, Ability to identify and develop effective coping behaviors will improve and Compliance with prescribed medications will improve  Medication Management: RN will administer medications as ordered by provider, will assess and evaluate patient's response and provide education to patient for prescribed medication. RN will report any adverse and/or side effects to prescribing provider.  Therapeutic Interventions: 1 on 1 counseling sessions, Psychoeducation, Medication administration, Evaluate responses to treatment, Monitor vital signs and CBGs as ordered, Perform/monitor CIWA, COWS, AIMS and Fall Risk screenings as ordered, Perform wound care treatments as ordered.  Evaluation of Outcomes: Progressing   [name] Treatment Plan for Primary Diagnosis: <principal problem not  specified> Long Term Goal(s): Safe transition to appropriate next level of care at discharge, Engage patient in therapeutic group addressing interpersonal concerns.  Short Term Goals: Engage patient in aftercare planning with referrals and resources and Increase skills for wellness and recovery  Therapeutic Interventions: Assess for all discharge needs, 1 to 1 time with Social worker, Explore available resources and support systems, Assess for adequacy in community support network, Educate family and significant other(s) on suicide prevention, Complete Psychosocial Assessment, Interpersonal group therapy.  Evaluation of Outcomes: Progressing   Progress in Treatment: Attending groups: Yes. Participating in groups: Yes. Taking medication as prescribed: Yes. Toleration medication: Yes. Family/Significant other contact made: No, will contact:  pt's cousin  Patient understands diagnosis: No. Discussing patient identified problems/goals with staff: Yes. Medical problems stabilized or resolved: Yes. Denies suicidal/homicidal ideation: Yes. Issues/concerns per patient self-inventory: No. Other:   New problem(s) identified: No, Describe:  none  New Short Term/Long Term Goal(s): Medication stabilization, elimination of SI thoughts, and development of a comprehensive mental wellness plan.   Patient Goals:  "Housing and I want  to get treated right"  Discharge Plan or Barriers: CSW will continue to follow up for appropriate referrals and possible discharge planning  Reason for Continuation of Hospitalization: Aggression Delusions  Medication stabilization  Estimated Length of Stay: 3-5 days   Attendees: Patient: Peter Wilson  10/24/2019 3:43 PM  Physician: Dr. Jeannine Kitten, MD 10/24/2019 3:43 PM  Nursing: Orlene Erm 10/24/2019 3:43 PM  RN Care Manager: 10/24/2019  3:43 PM  Social Worker: Ardelle Anton, [place] 10/24/2019 3:43 PM  Recreational Therapist:  10/24/2019 3:43 PM  Other: Ovidio Kin, MSW intern  10/24/2019 3:43 PM  Other:  10/24/2019 3:43 PM  Other: 10/24/2019 3:43 PM    Scribe for Treatment Team: Billey Chang, [place] [facility] 10/24/2019 3:43 PM

## 2019-10-24 NOTE — [facility] Group Notes (Signed)
[name] Group Therapy Note    Date/Time: 10/24/2019 @ 11:30am  Mood: Pleasant  Type of Therapy and Topic: Group Therapy: Avoiding Self-Sabotaging and Enabling Behaviors  Participation Level: Active   Description of Group:  In this group, patients will learn how to identify obstacles, self-sabotaging and enabling behaviors, as well as: what are they, why do we do them and what needs these behaviors meet. Discuss unhealthy relationships and how to have positive healthy boundaries with those that sabotage and enable. Explore aspects of self-sabotage and enabling in yourself and how to limit these self-destructive behaviors in everyday life.   Therapeutic Goals: 1. Patient will identify one obstacle that relates to self-sabotage and enabling behaviors 2. Patient will identify one personal self-sabotaging or enabling behavior they did prior to admission 3. Patient will state a plan to change the above identified behavior 4. Patient will demonstrate ability to communicate their needs through discussion and/or role play.    Summary of Patient Progress:   Patient was active and engaged throughout group therapy. Patient did not talk at first but then opened up about how he self-sabotages himself. Patient stated that he allows others to take over his life and he often blames others. Patient stated that he just needs to take control of his own life because he can trust himself. Patient stated that he often blames professionals for doing him wrong and he shouldn't even have to allow them to get close enough to do him wrong.    Therapeutic Modalities:  Cognitive Behavioral Therapy Person-Centered Therapy Motivational Interviewing    [place], [place]

## 2019-10-24 NOTE — Progress Notes (Signed)
Patient became more and more agitated, yelling, saying that he is not being treated right. Received Ativan 4 mg by mouth per MD order.

## 2019-10-25 DIAGNOSIS — R450 Nervousness: Secondary | ICD-10-CM | POA: Diagnosis not present

## 2019-10-25 DIAGNOSIS — F209 Schizophrenia, unspecified: Secondary | ICD-10-CM | POA: Diagnosis not present

## 2019-10-25 MED ORDER — LORAZEPAM 1 MG PO TABS
ORAL_TABLET | ORAL | Status: AC
  Filled 2019-10-25: qty 2

## 2019-10-25 MED ORDER — DIPHENHYDRAMINE HCL 25 MG PO CAPS
ORAL_CAPSULE | ORAL | Status: AC
  Filled 2019-10-25: qty 2

## 2019-10-25 MED ORDER — LORAZEPAM 1 MG PO TABS
2.0000 mg | ORAL_TABLET | ORAL | Status: DC | PRN
  Administered 2019-10-25 – 2019-10-27 (×4): 2 mg via ORAL
  Filled 2019-10-25 (×3): qty 2

## 2019-10-25 MED ORDER — DIPHENHYDRAMINE HCL 25 MG PO CAPS
50.0000 mg | ORAL_CAPSULE | Freq: Four times a day (QID) | ORAL | Status: DC | PRN
  Administered 2019-10-25 – 2019-10-26 (×3): 50 mg via ORAL
  Filled 2019-10-25 (×2): qty 2

## 2019-10-25 MED ORDER — DIPHENHYDRAMINE HCL 50 MG/ML IJ SOLN: 50.0000 mg | Freq: Four times a day (QID) | INTRAMUSCULAR | Status: DC | PRN

## 2019-10-25 MED ORDER — LORAZEPAM 2 MG/ML IJ SOLN: 2.0000 mg | INTRAMUSCULAR | Status: DC | PRN

## 2019-10-25 NOTE — Plan of Care (Signed)
  Problem: Safety: Goal: Periods of time without injury will increase Outcome: Progressing   Problem: Coping: Goal: Ability to verbalize frustrations and anger appropriately will improve Outcome: Not Progressing   D: Pt alert and oriented on the unit. Pt engaging with RN staff and other pts. Pt denies SI/HI, A/VH. Pt was irritable and wanted to go home because "I got my Social Security check, and I don't want my sister to cash my check and steal my money." Pt also cursed at staff and became verbally aggressive.  A: Education, support and encouragement provided, q15 minute safety checks remain in effect. Medications administered per MD orders. R: No reactions/side effects to medicine noted. Pt denies any concerns at this time, and verbally contracts for safety. Pt ambulating on the unit with no issues. Pt remains safe on and off the unit.

## 2019-10-25 NOTE — Progress Notes (Signed)
Patient ID: Peter Wilson, male   DOB: 03/08/1976, 43 y.o.   MRN: [id number]   [place] NOVEL CORONAVIRUS (COVID-19) DAILY CHECK-OFF SYMPTOMS - answer yes or no to each - every day NO YES  Have you had a fever in the past 24 hours?  . Fever (Temp > 37.80C / 100F) X   Have you had any of these symptoms in the past 24 hours? . New Cough .  Sore Throat  .  Shortness of Breath .  Difficulty Breathing .  Unexplained Body Aches   X   Have you had any one of these symptoms in the past 24 hours not related to allergies?   . Runny Nose .  Nasal Congestion .  Sneezing   X   If you have had runny nose, nasal congestion, sneezing in the past 24 hours, has it worsened?  X   EXPOSURES - check yes or no X   Have you traveled outside the state in the past 14 days?  X   Have you been in contact with someone with a confirmed diagnosis of COVID-19 or PUI in the past 14 days without wearing appropriate PPE?  X   Have you been living in the same home as a person with confirmed diagnosis of COVID-19 or a PUI (household contact)?    X   Have you been diagnosed with COVID-19?    X              What to do next: Answered NO to all: Answered YES to anything:   Proceed with unit schedule Follow the [facility] Inpatient Flowsheet.   

## 2019-10-25 NOTE — Progress Notes (Addendum)
[facility]  10/25/2019 11:23 AM Peter Wilson  MRN:  [id number]   Subjective:  Peter Wilson reported " I need to find housing the social worker says she was going get me the information."  Evaluation: Patient observed sitting in dayroom interacting with nursing students.  He is awake, alert and oriented x3.  Denies suicidal or homicidal ideations.  Denies auditory or visual hallucinations.  Patient presents tangental with loose association and delusional throughout this assessment.  Unable to follow conversation as patient topic jumping and rambling.  Reports [facility] and has an act team which he states has not been following up with him daily.  Patient appears focused on finding shelter as he has requested resources 3 different times throughout this assessment.  Patient had to be redirected regarding sexual inappropriate comments.  We will continue to monitor for safety.  Support, encouragement and reassurance was provided.  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 Marijuana abuse   . Schizophrenia, paranoid (HCC)   . Tardive dyskinesia   . Tobacco abuse     Past Surgical History:  Procedure Laterality Date  . NO PAST SURGERIES     Family History: History reviewed. No pertinent family history. Family Psychiatric  History: No new data Social History:  Social History   Substance and Sexual Activity  Alcohol Use Yes  . Alcohol/week: 0.0 standard drinks   Comment: Unknown      Social History   Substance and Sexual Activity  Drug Use Yes  . Frequency: 7.0 times per week  . Types: Marijuana   Comment: Unknown     Social History    Socioeconomic History  . Marital status: Single    Spouse name: Not on file  . Number of children: Not on file  . Years of education: Not on file  . Highest education level: Not on file  Occupational History  . Occupation: Disability  Social Needs  . Financial resource strain: Not on file  . Food insecurity    Worry: Not on file    Inability: Not on file  . Transportation needs    Medical: Not on file    Non-medical: Not on file  Tobacco Use  . Smoking status: Current Every Day Smoker    Packs/day: 2.00    Types: Cigarettes  . Smokeless tobacco: Never Used  . Tobacco comment: Unknown   Substance and Sexual Activity  . Alcohol use: Yes    Alcohol/week: 0.0 standard drinks    Comment: Unknown   . Drug use: Yes    Frequency: 7.0 times per week    Types: Marijuana    Comment: Unknown   . Sexual activity: Not on file  Lifestyle  . Physical activity    Days per week: Not on file    Minutes per session: Not on file  . Stress: Not on file  Relationships  . Social Musicianconnections    Talks on phone: Not on file    Gets together: Not on file    Attends religious service: Not on file    Active member of club or organization: Not on file    Attends meetings of clubs or organizations: Not on file  Relationship status: Not on file  Other Topics Concern  . Not on file  Social History Narrative   Pt [facility], was assigned to a hotel in [place] by his [facility] provider[facility] [place], [place].     Additional Social History:                         Sleep: Fair  Appetite:  Fair  Current Medications: Current Facility-Administered Medications  Medication Dose Route Frequency Provider Last Rate Last Dose  . benztropine (COGENTIN) tablet 1 mg  1 mg Oral BID [name], MD   1 mg at 10/25/19 0867  . diphenhydrAMINE (BENADRYL) injection 50 mg  50 mg Intramuscular Q6H PRN [name], MD   50 mg at 10/25/19 0804  .  divalproex (DEPAKOTE) DR tablet 750 mg  750 mg Oral BID Rankin, Shuvon B, NP   750 mg at 10/25/19 0752  . gabapentin (NEURONTIN) capsule 600 mg  600 mg Oral TID [name], MD   600 mg at 10/25/19 0751  . haloperidol (HALDOL) tablet 10 mg  10 mg Oral Q6H PRN [name], MD   10 mg at 10/23/19 1958   Or  . haloperidol lactate (HALDOL) injection 10 mg  10 mg Intramuscular Q6H PRN [name], MD      . LORazepam (ATIVAN) injection 2 mg  2 mg Intramuscular Q4H PRN [name], MD      . LORazepam (ATIVAN) injection 4 mg  4 mg Intramuscular Once [name], MD      . risperiDONE (RISPERDAL) tablet 3 mg  3 mg Oral TID [name], MD   3 mg at 10/25/19 0754  . temazepam (RESTORIL) capsule 30 mg  30 mg Oral QHS [name], MD   30 mg at 10/24/19 1948    Lab Results:  No results found for this or any previous visit (from the past 55 hour(s)).  Blood Alcohol level:  Lab Results  Component Value Date   ETH <10 10/21/2019   ETH 14 (H) 61/95/0932    Metabolic Disorder Labs: Lab Results  Component Value Date   HGBA1C 4.6 (L) 04/02/2018   MPG 85.32 04/02/2018   MPG 91 04/11/2017   Lab Results  Component Value Date   PROLACTIN 26.4 (H) 04/11/2017   PROLACTIN 40.2 (H) 01/27/2016   Lab Results  Component Value Date   CHOL 202 (H) 04/02/2018   TRIG 102 04/02/2018   HDL 46 04/02/2018   CHOLHDL 4.4 04/02/2018   VLDL 20 04/02/2018   LDLCALC 136 (H) 04/02/2018   LDLCALC 167 (H) 04/11/2017    Physical Findings: AIMS:  , ,  ,  ,    CIWA:    COWS:     Musculoskeletal: Strength & Muscle Tone: within normal limits Gait & Station: normal Patient leans: N/A  Psychiatric Specialty Exam: Physical Exam  Vitals reviewed. Constitutional: He appears well-developed.  Psychiatric: He has a normal mood and affect. His behavior is normal.    Review of Systems  Psychiatric/Behavioral: Positive for depression. The patient is nervous/anxious.   All other systems reviewed and are  negative.   Blood pressure 115/81, pulse 80, temperature (!) 97.5 F (36.4 C), temperature source Oral, resp. rate 18, height 6' (1.829 m), weight 68 kg, SpO2 100 %.Body mass index is 20.34 kg/m.  General Appearance: Disheveled  Eye Contact:  Good  Speech:  Clear and Coherent and Pressured  Volume:  Increased  Mood:  Hypomanic  Affect:  Congruent  Thought Process:  Goal Directed and Descriptions of Associations: Loose  Orientation:  Full (Time, Place, and Person)  Thought Content:  Delusions and Tangential  Suicidal Thoughts:  No  Homicidal Thoughts:  No  Memory:  Immediate;   Fair Recent;   Fair Remote;   Fair  Judgement:  Fair  Insight:  Fair  Psychomotor Activity:  Increased  Concentration:  Concentration: Poor and Attention Span: Poor  Recall:  Poor  Fund of Knowledge:  Poor  Language:  Fair  Akathisia:  Negative  Handed:  Right  AIMS (if indicated):     Assets:  Physical Health Resilience  ADL's:  Intact  Cognition:  WNL  Sleep:  Number of Hours: 8.25     Treatment Plan Summary: Daily contact with patient to assess and evaluate symptoms and progress in treatment and Medication management   Continue current treatment plan on 10/25/2019 as listed below except were noted  Continue Depakote 750 mg p.o. twice daily Continue Neurontin 600 mg p.o. 3 times daily Continue Cogentin 1 mg p.o. twice daily Continue Resporal 3 mg p.o. 3 times daily [place] to 34 mg received 10/24/2011 IM Continue Restoril 30 mg p.o. nightly   CSW to continue working on discharge disposition Patient encouraged to participate with therapeutic milieu  Oneta Rack, NP 10/25/2019, 11:23 AM   Attest to NP progress note

## 2019-10-25 NOTE — [facility] Group Notes (Signed)
  [place] [name] Group Therapy Note  Date/Time:  10/25/2019 11:15AM-12:00PM  Type of Therapy and Topic:  Group Therapy:  Avoiding Rehospitalization  Participation Level:  Minimal   Description of Group This process group involved patients discussing their plans related to taking care of themselves at discharge in order to avoid, if at all possible, being rehospitalized in the future.  The group brainstormed specific ways in which they will be able to implement these plans.    Therapeutic Goals 1. Patient will identify and describe 2-3 specific tasks they plan to undertake in order to maximize their wellness and avoid coming back to a hospital. 2. Patient will verbalize benefits of each task described, as well as barriers to implementation. 3. Patients will brainstorm ways to implement their plans.  Summary of Patient Progress:  The patient expressed his plans for self-care at discharge include getting his own place to live and learning how to cook.  The way in which this will help is it would get him away from his family of whom he is very paranoid.  The patient participated sporadically with an affect that was blunted.  He was in and out of the room and interrupted the group each time he returned, as he wanted to talk.  Therapeutic Modalities Stages of Change theory Motivational Interviewing    Selmer Domin[place], [name] 10/25/2019, 9:01 AM

## 2019-10-26 NOTE — Progress Notes (Signed)
Patient ID: Peter Wilson, male   DOB: 28-Dec-1975, 43 y.o.   MRN: [id number]    NOVEL CORONAVIRUS (COVID-19) DAILY CHECK-OFF SYMPTOMS - answer yes or no to each - every day NO YES  Have you had a fever in the past 24 hours?  . Fever (Temp > 37.80C / 100F) X   Have you had any of these symptoms in the past 24 hours? . New Cough .  Sore Throat  .  Shortness of Breath .  Difficulty Breathing .  Unexplained Body Aches   X   Have you had any one of these symptoms in the past 24 hours not related to allergies?   . Runny Nose .  Nasal Congestion .  Sneezing   X   If you have had runny nose, nasal congestion, sneezing in the past 24 hours, has it worsened?  X   EXPOSURES - check yes or no X   Have you traveled outside the state in the past 14 days?  X   Have you been in contact with someone with a confirmed diagnosis of COVID-19 or PUI in the past 14 days without wearing appropriate PPE?  X   Have you been living in the same home as a person with confirmed diagnosis of COVID-19 or a PUI (household contact)?    X   Have you been diagnosed with COVID-19?    X              What to do next: Answered NO to all: Answered YES to anything:   Proceed with unit schedule Follow the [facility] Inpatient Flowsheet.

## 2019-10-26 NOTE — Progress Notes (Addendum)
[facility] MD Progress Note  10/26/2019 8:57 AM Peter Wilson  MRN:  [id number]   Subjective:  Peter Wilson reported " I am ready to go call Dr. Jake Samples, he known me and will say that I can go get my check from my sister"  Evaluation: [place] observed standing at the nurses station.  He continues to be intrusive and needing constant redirection.  He denies suicidal or homicidal ideations.  Denies auditory or visual hallucinations.  Staff reports patient is taken to tolerating medications well.  Continues to to present with pressure speech and delusional ideations.  He is awake,alert and oriented x3.  Reports taking and tolerating medications well.  Reports a good appetite.  States he is resting well throughout the night.  Patient remains focused on disability check and sister stealing from him.  Reports he is hopeful to get housing.  Patient reports worsening nightmares with medications.  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 Marijuana abuse   . Schizophrenia, paranoid ([place])   . Tardive dyskinesia   . Tobacco abuse     Past Surgical History:  Procedure Laterality Date  . NO PAST SURGERIES     Family History: History reviewed. No pertinent family history. Family Psychiatric  History: No new data Social History:  Social History   Substance and Sexual Activity  Alcohol Use Yes  . Alcohol/week: 0.0 standard drinks   Comment: Unknown      Social History   Substance and Sexual Activity  Drug Use Yes  . Frequency: 7.0 times per week  . Types: Marijuana   Comment: Unknown     Social History   Socioeconomic History  .  Marital status: Single    Spouse name: Not on file  . Number of children: Not on file  . Years of education: Not on file  . Highest education level: Not on file  Occupational History  . Occupation: Disability  Social Needs  . Financial resource strain: Not on file  . Food insecurity    Worry: Not on file    Inability: Not on file  . Transportation needs    Medical: Not on file    Non-medical: Not on file  Tobacco Use  . Smoking status: Current Every Day Smoker    Packs/day: 2.00    Types: Cigarettes  . Smokeless tobacco: Never Used  . Tobacco comment: Unknown   Substance and Sexual Activity  . Alcohol use: Yes    Alcohol/week: 0.0 standard drinks    Comment: Unknown   . Drug use: Yes    Frequency: 7.0 times per week    Types: Marijuana    Comment: Unknown   . Sexual activity: Not on file  Lifestyle  . Physical activity    Days per week: Not on file    Minutes per session: Not on file  . Stress: Not on file  Relationships  . Social Herbalist on phone: Not on file    Gets together: Not on file    Attends religious service: Not on file    Active member of club or organization: Not on file    Attends meetings of clubs or organizations: Not on file  Relationship status: Not on file  Other Topics Concern  . Not on file  Social History Narrative   Pt [facility], was [place] to a hotel in [place] by [facility] provider[facility] [place], [place].     Additional Social History:                         Sleep: Fair  Appetite:  Fair  Current Medications: Current Facility-Administered Medications  Medication Dose Route Frequency Provider Last Rate Last Dose  . benztropine (COGENTIN) tablet 1 mg  1 mg Oral BID Malvin JohnsFarah, Brian, MD   1 mg at 10/26/19 [id number]  . diphenhydrAMINE (BENADRYL) capsule 50 mg  50 mg Oral Q6H PRN Malvin JohnsFarah, Brian, MD   50 mg at 10/26/19 [id number]   Or  . diphenhydrAMINE (BENADRYL) injection 50 mg   50 mg Intramuscular Q6H PRN Malvin JohnsFarah, Brian, MD      . divalproex (DEPAKOTE) DR tablet 750 mg  750 mg Oral BID Rankin, Shuvon B, NP   750 mg at 10/26/19 0722  . gabapentin (NEURONTIN) capsule 600 mg  600 mg Oral TID Malvin JohnsFarah, Brian, MD   600 mg at 10/25/19 1650  . haloperidol (HALDOL) tablet 10 mg  10 mg Oral Q6H PRN Malvin JohnsFarah, Brian, MD   10 mg at 10/23/19 1958   Or  . haloperidol lactate (HALDOL) injection 10 mg  10 mg Intramuscular Q6H PRN Malvin JohnsFarah, Brian, MD      . LORazepam (ATIVAN) tablet 2 mg  2 mg Oral Q4H PRN Malvin JohnsFarah, Brian, MD   2 mg at 10/25/19 1319   Or  . LORazepam (ATIVAN) injection 2 mg  2 mg Intramuscular Q4H PRN Malvin JohnsFarah, Brian, MD      . LORazepam (ATIVAN) injection 4 mg  4 mg Intramuscular Once Malvin JohnsFarah, Brian, MD      . risperiDONE (RISPERDAL) tablet 3 mg  3 mg Oral TID Malvin JohnsFarah, Brian, MD   3 mg at 10/26/19 [id number]  . temazepam (RESTORIL) capsule 30 mg  30 mg Oral QHS Malvin JohnsFarah, Brian, MD   30 mg at 10/24/19 1948    Lab Results:  No results found for this or any previous visit (from the past 48 hour(s)).  Blood Alcohol level:  Lab Results  Component Value Date   ETH <10 10/21/2019   ETH 14 (H) 10/18/2019    Metabolic Disorder Labs: Lab Results  Component Value Date   HGBA1C 4.6 (L) 04/02/2018   MPG 85.32 04/02/2018   MPG 91 04/11/2017   Lab Results  Component Value Date   PROLACTIN 26.4 (H) 04/11/2017   PROLACTIN 40.2 (H) 01/27/2016   Lab Results  Component Value Date   CHOL 202 (H) 04/02/2018   TRIG 102 04/02/2018   HDL 46 04/02/2018   CHOLHDL 4.4 04/02/2018   VLDL 20 04/02/2018   LDLCALC 136 (H) 04/02/2018   LDLCALC 167 (H) 04/11/2017    Physical Findings: AIMS:  , ,  ,  ,    CIWA:    COWS:     Musculoskeletal: Strength & Muscle Tone: within normal limits Gait & Station: normal Patient leans: N/A  Psychiatric Specialty Exam: Physical Exam  Vitals reviewed. Constitutional: He appears well-developed.  Skin: Skin is warm.  Psychiatric: He has a normal mood and  affect. His behavior is normal.    Review of Systems  Psychiatric/Behavioral: Positive for depression. The patient is nervous/anxious.   All other systems reviewed and are negative.  Blood pressure 115/81, pulse 80, temperature (!) 97.5 F (36.4 C), temperature source Oral, resp. rate 18, height 6' (1.829 m), weight 68 kg, SpO2 100 %.Body mass index is 20.34 kg/m.  General Appearance: Disheveled  Eye Contact:  Good  Speech:  Clear and Coherent and Pressured  Volume:  Increased  Mood:  Hypomanic  Affect:  Congruent  Thought Process:  Goal Directed and Descriptions of Associations: Loose  Orientation:  Full (Time, Place, and Person)  Thought Content:  Delusions and Tangential  Suicidal Thoughts:  No  Homicidal Thoughts:  No  Memory:  Immediate;   Fair Recent;   Fair Remote;   Fair  Judgement:  Fair  Insight:  Fair  Psychomotor Activity:  Increased  Concentration:  Concentration: Poor and Attention Span: Poor  Recall:  Poor  Fund of Knowledge:  Poor  Language:  Fair  Akathisia:  Negative  Handed:  Right  AIMS (if indicated):     Assets:  Physical Health Resilience  ADL's:  Intact  Cognition:  WNL  Sleep:  Number of Hours: 8.25     Treatment Plan Summary: Daily contact with patient to assess and evaluate symptoms and progress in treatment and Medication management   Continue current treatment plan on 10/26/2019 as listed below except were noted  Schizophrenia:  Continue Depakote 750 mg p.o. twice daily Continue Neurontin 600 mg p.o. 3 times daily Continue Cogentin 1 mg p.o. twice daily Continue Risperdal 3 mg p.o. 3 times daily [place] to 34 mg received 10/24/2011 IM Continue Restoril 30 mg p.o. nightly   CSW to continue working on discharge disposition Patient encouraged to participate with therapeutic milieu  Oneta Rack, NP 10/26/2019, 8:57 AM   Attest to NP progress note

## 2019-10-26 NOTE — Plan of Care (Signed)
  Problem: Activity: Goal: Interest or engagement in activities will improve Outcome: Progressing   Problem: Coping: Goal: Ability to demonstrate self-control will improve Outcome: Not Progressing   Problem: Safety: Goal: Periods of time without injury will increase Outcome: Progressing

## 2019-10-26 NOTE — BHH Group Notes (Signed)
[facility] [name] Group Therapy Note  Date/Time:  10/26/2019  11:00AM-12:00PM  Type of Therapy and Topic:  Group Therapy:  Music and Mood  Participation Level:  Did Not Attend   Description of Group: In this process group, members listened to a variety of genres of music and identified that different types of music evoke different responses.  Patients were encouraged to identify music that was soothing for them and music that was energizing for them.  Patients discussed how this knowledge can help with wellness and recovery in various ways including managing depression and anxiety as well as encouraging healthy sleep habits.    Therapeutic Goals: 1. Patients will explore the impact of different varieties of music on mood 2. Patients will verbalize the thoughts they have when listening to different types of music 3. Patients will identify music that is soothing to them as well as music that is energizing to them 4. Patients will discuss how to use this knowledge to assist in maintaining wellness and recovery 5. Patients will explore the use of music as a coping skill  Summary of Patient Progress:  The patient was invited to attend group but chose not to do so initially.  He later came in and listened to a song but left again.  At one point, he came back into the room, bent over to speak to CSW and apologized if he had been offensive in any way.  CSW explained that he had not, and before it was possible to invite him to stay for the music, another patient told him "get your a-- out my face, dude."  This was upsetting to patient, who left quickly and angrily.  Therapeutic Modalities: Solution Focused Brief Therapy Activity   Selmer Dominion, [name]

## 2019-10-27 MED ORDER — PALIPERIDONE PALMITATE ER 156 MG/ML IM SUSY: 156.0000 mg | PREFILLED_SYRINGE | Freq: Once | INTRAMUSCULAR | Status: DC

## 2019-10-27 MED ORDER — PALIPERIDONE PALMITATE ER 156 MG/ML IM SUSY
156.0000 mg | PREFILLED_SYRINGE | Freq: Once | INTRAMUSCULAR | Status: DC
  Filled 2019-10-27: qty 1

## 2019-10-27 MED FILL — INVEGA SUSTENNA 156 MG PREF: 156 | 28 days supply | Qty: 1 | Fill #0

## 2019-10-27 NOTE — BHH Group Notes (Signed)
[facility] [name] Group Therapy Note  Date/Time: 10/27/2019 @ 1:30pm  Type of Therapy and Topic:  Group Therapy:  Overcoming Obstacles  Participation Level:  B[facility] PARTICIPATION LEVEL: Active  Description of Group:    In this group patients will be encouraged to explore what they see as obstacles to their own wellness and recovery. They will be guided to discuss their thoughts, feelings, and behaviors related to these obstacles. The group will process together ways to cope with barriers, with attention given to specific choices patients can make. Each patient will be challenged to identify changes they are motivated to make in order to overcome their obstacles. This group will be process-oriented, with patients participating in exploration of their own experiences as well as giving and receiving support and challenge from other group members.  Therapeutic Goals: 1. Patient will identify personal and current obstacles as they relate to admission. 2. Patient will identify barriers that currently interfere with their wellness or overcoming obstacles.  3. Patient will identify feelings, thought process and behaviors related to these barriers. 4. Patient will identify two changes they are willing to make to overcome these obstacles:    Summary of Patient Progress   Patient was active and engaged throughout group therapy today. Patient was able to identify his current obstacle being not being able to trust people. Patient was able to identify barriers that currently interfere with overcoming of his obstacle which is professionals and other people getting in the way of his success. Patient was able to identify two changes that he is willing to make to overcome his obstacle which is to get out of the hospital and go get his licenses/SSI checks together.    Therapeutic Modalities:   Cognitive Behavioral Therapy Solution Focused Therapy Motivational Interviewing Relapse Prevention Therapy   Ardelle Anton,  [name]

## 2019-10-27 NOTE — Progress Notes (Signed)
DAR NOTE:  D:Pt presents with pressured speech and his mood is labile.  Pt denies SI/HI. Pt States that he sees his family in his dreams who got killed at a party because his family was shot 5 times at the party.  Pt said that he was at the party and  feels guilty because he lived and his family did not survive.  Pt is preoccupied with "his check" and  is afraid someone is going to steal his money.  Pt is able to be redirected and calms down with reassurance.   A: RN actively listened to pt describe his feelings and concerns.  RN provided reassurance and emotional support.  RN administered medications per physician orders.  15 minute safety checks remained in place.   R: Pt interacts with his peers on the unit without incident  Pt stated that he might be able to go home tomorrow if everything "works out" with his check.  Pt tolerated medications without any adverse reactions. Pt is expressive and makes his needs known.

## 2019-10-27 NOTE — Progress Notes (Signed)
Adult Psychoeducational Group Note  Date:  10/27/2019 Time:  9:10 PM  Group Topic/Focus:  Wrap-Up Group:   The focus of this group is to help patients review their daily goal of treatment and discuss progress on daily workbooks.  Participation Level:  Active  Participation Quality:  Appropriate  Affect:  Appropriate  Cognitive:  Appropriate  Insight: Appropriate  Engagement in Group:  Engaged  Modes of Intervention:  Discussion  Additional Comments:  Pt said his day was a 3. The one positive thing that happen to him he goes home tomorrow.  Lenice Llamas Long 10/27/2019, 9:10 PM

## 2019-10-27 NOTE — Progress Notes (Signed)
[facility] MD Progress Note  10/27/2019 12:04 PM Peter Wilson  MRN:  [id number] Subjective:    Patient continues to be intrusive not particular argumentative but very insistent upon discharge he is sluggish on his medication but still is manic pressured and intrusive.  He denies thoughts of harming self or others.  No acute dangerousness and he has signed a 72-hour form and he does have a long-acting injectable on board so we discussed the plan individually and in team of a discharge tomorrow Principal Problem: Schizoaffective manic with psychosis Diagnosis: Active Problems:   Schizophrenia ([facility])  Total Time spent with patient: 20 minutes  Past Psychiatric History: see eval  Past Medical History:  Past Medical History:  Diagnosis Date  . Marijuana abuse   . Schizophrenia, paranoid ([facility])   . Tardive dyskinesia   . Tobacco abuse     Past Surgical History:  Procedure Laterality Date  . NO PAST SURGERIES     Family History: History reviewed. No pertinent family history. Family Psychiatric  History: see eval Social History:  Social History   Substance and Sexual Activity  Alcohol Use Yes  . Alcohol/week: 0.0 standard drinks   Comment: Unknown      Social History   Substance and Sexual Activity  Drug Use Yes  . Frequency: 7.0 times per week  . Types: Marijuana   Comment: Unknown     Social History   Socioeconomic History  . Marital status: Single    Spouse name: Not on file  . Number of children: Not on file  . Years of education: Not on file  . Highest education level: Not on file  Occupational History  . Occupation: Disability  Social Needs  . Financial resource strain: Not on file  . Food insecurity    Worry: Not on file    Inability: Not on file  . Transportation needs    Medical: Not on file    Non-medical: Not on file  Tobacco Use  . Smoking status: Current Every Day Smoker    Packs/day: 2.00    Types: Cigarettes  . Smokeless tobacco: Never Used  . Tobacco  comment: Unknown   Substance and Sexual Activity  . Alcohol use: Yes    Alcohol/week: 0.0 standard drinks    Comment: Unknown   . Drug use: Yes    Frequency: 7.0 times per week    Types: Marijuana    Comment: Unknown   . Sexual activity: Not on file  Lifestyle  . Physical activity    Days per week: Not on file    Minutes per session: Not on file  . Stress: Not on file  Relationships  . Social Musician on phone: Not on file    Gets together: Not on file    Attends religious service: Not on file    Active member of club or organization: Not on file    Attends meetings of clubs or organizations: Not on file    Relationship status: Not on file  Other Topics Concern  . Not on file  Social History Narrative   Pt discharged from [facility] on 10/10/2019, was assigned to a hotel in [place] by his ACTT provider, [facility] in [place], [place].     Additional Social History:                         Sleep: Good  Appetite:  Good  Current Medications: Current Facility-Administered Medications  Medication  Dose Route Frequency Provider Last Rate Last Dose  . benztropine (COGENTIN) tablet 1 mg  1 mg Oral BID [name], MD   1 mg at 10/27/19 0809  . diphenhydrAMINE (BENADRYL) capsule 50 mg  50 mg Oral Q6H PRN [name], MD   50 mg at 10/26/19 1347   Or  . diphenhydrAMINE (BENADRYL) injection 50 mg  50 mg Intramuscular Q6H PRN [name], MD      . divalproex (DEPAKOTE) DR tablet 750 mg  750 mg Oral BID Rankin, Shuvon B, NP   750 mg at 10/27/19 0809  . gabapentin (NEURONTIN) capsule 600 mg  600 mg Oral TID [name], MD   600 mg at 10/27/19 0809  . haloperidol (HALDOL) tablet 10 mg  10 mg Oral Q6H PRN [name], MD   10 mg at 10/26/19 1543   Or  . haloperidol lactate (HALDOL) injection 10 mg  10 mg Intramuscular Q6H PRN [name], MD      . LORazepam (ATIVAN) tablet 2 mg  2 mg Oral Q4H PRN [name], MD   2 mg at 10/27/19 0109   Or  .  LORazepam (ATIVAN) injection 2 mg  2 mg Intramuscular Q4H PRN [name], MD      . LORazepam (ATIVAN) injection 4 mg  4 mg Intramuscular Once [name], MD      . risperiDONE (RISPERDAL) tablet 3 mg  3 mg Oral TID [name], MD   3 mg at 10/27/19 1142  . temazepam (RESTORIL) capsule 30 mg  30 mg Oral QHS [name], MD   30 mg at 10/24/19 1948    Lab Results: No results found for this or any previous visit (from the past 46 hour(s)).  Blood Alcohol level:  Lab Results  Component Value Date   ETH <10 10/21/2019   ETH 14 (H) 32/35/5732    Metabolic Disorder Labs: Lab Results  Component Value Date   HGBA1C 4.6 (L) 04/02/2018   MPG 85.32 04/02/2018   MPG 91 04/11/2017   Lab Results  Component Value Date   PROLACTIN 26.4 (H) 04/11/2017   PROLACTIN 40.2 (H) 01/27/2016   Lab Results  Component Value Date   CHOL 202 (H) 04/02/2018   TRIG 102 04/02/2018   HDL 46 04/02/2018   CHOLHDL 4.4 04/02/2018   VLDL 20 04/02/2018   LDLCALC 136 (H) 04/02/2018   LDLCALC 167 (H) 04/11/2017    Physical Findings: AIMS:  , ,  ,  ,    CIWA:    COWS:     Musculoskeletal: Strength & Muscle Tone: within normal limits Gait & Station: normal Patient leans: N/A  Psychiatric Specialty Exam: Physical Exam  ROS  Blood pressure 115/81, pulse 80, temperature (!) 97.5 F (36.4 C), temperature source Oral, resp. rate 18, height 6' (1.829 m), weight 68 kg, SpO2 100 %.Body mass index is 20.34 kg/m.  General Appearance: Casual  Eye Contact:  Good  Speech:  Pressured  Volume:  Increased  Mood:  Hypomanic intrusive lack of boundaries  Affect:  Congruent  Thought Process:  Linear and Descriptions of Associations: Loose  Orientation:  Full (Time, Place, and Person)  Thought Content: Denies hallucinations  Suicidal Thoughts:  No  Homicidal Thoughts:  No  Memory:  Immediate;   Fair Recent;   Fair Remote;   Fair  Judgement:  Fair  Insight:  Fair  Psychomotor Activity:  Normal   Concentration:  Concentration: Fair and Attention Span: Fair  Recall:  Fair  Fund of Knowledge:  Fair  Language:  Fair  Akathisia:  Negative  Handed:  Right  AIMS (if indicated):     Assets:  Physical Health Resilience  ADL's:  Intact  Cognition:  WNL  Sleep:  Number of Hours: 8.25     Treatment Plan Summary: Daily contact with patient to assess and evaluate symptoms and progress in treatment and Medication management  Continue reality based therapy current meds long-acting injectable administered no change in precautions probable discharge tomorrow  Malvin JohnsFARAH,Angy Swearengin, MD 10/27/2019, 12:04 PM

## 2019-10-27 NOTE — Progress Notes (Signed)
Recreation Therapy Notes  Date: 11.2.20 Time: 1000 Location: 500 [address] Dayroom  Group Topic: Goal Setting  Goal Area(s) Addresses:  Patient will be able to identify at least 3 life goals.  Patient will be able to identify benefit of investing in life goals.  Patient will be able to identify benefit of setting life goals.   Intervention: Worksheet, pencils  Activity: Goal Planning.  Patients were to identify goals they want to accomplish in the next week, month, year and 5 years.  Patients were to then identify obstacles to reaching goals, what would be needed to reach those goals and what they can start doing now to work towards goals.  Education:  Discharge Planning, Coping Skills, Leisure Education   Education Outcome: Acknowledges Education/In Group Clarification Provided/Needs Additional Education  Clinical Observations: Pt did not attend group.    Peter Wilson, LRT/CTRS         Peter Wilson 10/27/2019 11:51 AM 

## 2019-10-28 MED ORDER — BENZTROPINE MESYLATE 1 MG PO TABS: 1.0000 mg | ORAL_TABLET | Freq: Two times a day (BID) | ORAL | 0 refills | Status: DC

## 2019-10-28 MED ORDER — TEMAZEPAM 30 MG PO CAPS: 30.0000 mg | ORAL_CAPSULE | Freq: Every day | ORAL | 0 refills | Status: DC

## 2019-10-28 MED ORDER — RISPERIDONE 3 MG PO TABS: 3.0000 mg | ORAL_TABLET | Freq: Three times a day (TID) | ORAL | 0 refills | Status: DC

## 2019-10-28 MED ORDER — DIVALPROEX SODIUM 250 MG PO DR TAB: 750.0000 mg | DELAYED_RELEASE_TABLET | Freq: Two times a day (BID) | ORAL | 0 refills | Status: DC

## 2019-10-28 MED ORDER — GABAPENTIN 300 MG PO CAPS: 600.0000 mg | ORAL_CAPSULE | Freq: Three times a day (TID) | ORAL | 0 refills | Status: DC

## 2019-10-28 NOTE — Progress Notes (Signed)
Pt discharged to lobby. Pt was stable and appreciative at that time. All papers and prescriptions were given and valuables returned. Verbal understanding expressed. Denies SI/HI and A/VH. Pt given opportunity to express concerns and ask questions.  

## 2019-10-28 NOTE — Progress Notes (Signed)
Patient ID: Peter Wilson, male   DOB: [birth year], 43 y.o.   MRN: [id number]   CSW met with patient before discharge to discuss his discharge. CSW asked the patient where he was going to go. Patient stated that his "case worker" has a hotel for him and has his check in [place].  CSW received a call from [facility] ACTT team stating that they have not received any information regarding the patient and his discharge. CSW explained that patient refused to give consent for CSW to talk to [facility] team. [facility] ACTT team, [name], stated that the doctor has been talking to her. CSW explained that social workers also have to have consent and the patient informed CSW multiple times during his stay that he does not want CSW to talk to his [facility] ACTT team.  Patient also stated during his stay that he does not want to go back to [facility] ACT[facility] team and to look into a different ACTT team that takes his insurance. [facility] was contacted and a referral was sent over by [facility].   CSW talked with patient regarding what the [facility] ACTT team stated and patient gave permission to talk to his ACTT team with him there. CSW and patient talked to [place] on the phone. Patient told [name] that he is going to drop [facility] team and go with [facility] and plans to get his check and get a phone. [name] stated that she is NOT putting the patient in a hotel and that he can come to [place], [place] to get his check and sign discharge paperwork. [name] stated that she would not come pick him up. CSW printed off the phone number and information to [facility] so that the patient could contact them once he gets a new phone. CSW also gave patient information on check cashing locations in [place], [place] and how to get back to [place] via the [employer] bus (and gave him a pass).   [facility] ACTT team is aware that the patient is switching to [facility] ACTT team and that they are still reviewing his case and the patient will need to call  [facility] once he obtains his new phone to start services.   Patient was put into a LYFT to the [facility] team office in [place] to sign his discharge paperwork and to obtain his [facility] check.

## 2019-10-28 NOTE — [facility] Suicide Risk Assessment (Addendum)
[facility] INPATIENT:  Family/Significant Other Suicide Prevention Education  Suicide Prevention Education:  Contact Attempts: Pt's cousin, Morrie Sheldon,  has been identified by the patient as the family member/significant other with whom the patient will be residing, and identified as the person(s) who will aid the patient in the event of a mental health crisis.  With written consent from the patient, two attempts were made to provide suicide prevention education, prior to and/or following the patient's discharge.  We were unsuccessful in providing suicide prevention education.  A suicide education pamphlet was given to the patient to share with family/significant other.  Date and time of first attempt:10/28/2019 @ 8:05am Date and time of second attempt: 10/28/2019 @ 9:25am  Trecia Rogers 10/28/2019, 8:07 AM

## 2019-10-28 NOTE — Care Management (Signed)
CMA was told by CSW Ardelle Anton, that patient no longer wanted ACTT services with [facility]. CMA sent out a referral to [facility] on Monday 10/27/2019. CMA followed with facility regarding referral. Intake stated ACTT would follow up with patient after discharge.   On Tuesday 10/28/2019 CMA contacted [facility] again regarding referral. Per Neoma Laming, with [facility] they have received the referral for patient and will follow up with patient after discharge. CMA provided patient's discharge date to [facility] as well.   CMA notified CSW, Ardelle Anton with all the information.     Stepheny Canal Care Management Assistant  Email:[email or web address] Office: [phone]

## 2019-10-28 NOTE — Progress Notes (Signed)
Pt present with irritability at the beginning of the shift, but the writer talked with pt, gave him his evening meds. Pt then went to bed without problems, pt has been asleep since, will continue to monitor.

## 2019-10-28 NOTE — [facility] Suicide Risk Assessment (Signed)
[facility] Discharge Suicide Risk Assessment   Principal Problem: <principal problem not specified> Discharge Diagnoses: Active Problems:   Schizophrenia ([place])   Total Time spent with patient: 15 minutes  Musculoskeletal: Strength & Muscle Tone: within normal limits Gait & Station: normal Patient leans: N/A  Psychiatric Specialty Exam: Review of Systems  All other systems reviewed and are negative.   Blood pressure 117/82, pulse 85, temperature (!) 97.5 F (36.4 C), temperature source Oral, resp. rate 18, height 6' (1.829 m), weight 68 kg, SpO2 100 %.Body mass index is 20.34 kg/m.  General Appearance: Casual  Eye Contact::  Fair  Speech:  Normal Rate409  Volume:  Normal  Mood:  Angry  Affect:  Congruent  Thought Process:  Coherent and Descriptions of Associations: Circumstantial  Orientation:  Full (Time, Place, and Person)  Thought Content:  Logical  Suicidal Thoughts:  No  Homicidal Thoughts:  No  Memory:  Immediate;   Fair Recent;   Fair Remote;   Fair  Judgement:  Fair  Insight:  Fair  Psychomotor Activity:  Increased  Concentration:  Fair  Recall:  [employer] of Knowledge:Fair  Language: Good  Akathisia:  Negative  Handed:  Right  AIMS (if indicated):     Assets:  Desire for Improvement Resilience  Sleep:  Number of Hours: 6  Cognition: WNL  ADL's:  Intact   Mental Status Per Nursing Assessment::   On Admission:  NA  Demographic Factors:  Male, Low socioeconomic status and Unemployed  Loss Factors: Financial problems/change in socioeconomic status  Historical Factors: Impulsivity  Risk Reduction Factors:   Positive therapeutic relationship  Continued Clinical Symptoms:  Alcohol/Substance Abuse/Dependencies Schizophrenia:   Paranoid or undifferentiated type  Cognitive Features That Contribute To Risk:  None    Suicide Risk:  Minimal: No identifiable suicidal ideation.  Patients presenting with no risk factors but with morbid ruminations; may be  classified as minimal risk based on the severity of the depressive symptoms  Follow-up Information    [facility], [facility] Follow up.   Why: A referral for ACTT services has been made. ACTT Admissions will contact you after discharge with follow up information.  Contact information: [address] [place] [address] [phone]           Plan Of Care/Follow-up recommendations:  Activity:  ad lib  Sharma Covert, MD 10/28/2019, 9:57 AM

## 2019-10-28 NOTE — Discharge Summary (Signed)
Physician Discharge Summary Note  Patient:  Peter Wilson is an 43 y.o., male MRN:  [id number] DOB:  [birth year] Patient phone:  [phone] (home)  Patient address:   [address] [place] [id number],  Total Time spent with patient: 15 minutes  Date of Admission:  10/22/2019 Date of Discharge: 10/28/2019  Reason for Admission:  Manic and psychotic behaviors  Principal Problem: <principal problem not specified> Discharge Diagnoses: Active Problems:   Schizophrenia [facility](HCC)   Past Psychiatric History: History of schizophrenia and polysubstance abuse with multiple inpatient admissions. Followed by [facility] ACT team.  Past Medical History:  Past Medical History:  Diagnosis Date  . Marijuana abuse   . Schizophrenia, paranoid (HCC)   . Tardive dyskinesia   . Tobacco abuse     Past Surgical History:  Procedure Laterality Date  . NO PAST SURGERIES     Family History: History reviewed. No pertinent family history. Family Psychiatric  History: Denies Social History:  Social History   Substance and Sexual Activity  Alcohol Use Yes  . Alcohol/week: 0.0 standard drinks   Comment: Unknown      Social History   Substance and Sexual Activity  Drug Use Yes  . Frequency: 7.0 times per week  . Types: Marijuana   Comment: Unknown     Social History   Socioeconomic History  . Marital status: Single    Spouse name: Not on file  . Number of children: Not on file  . Years of education: Not on file  . Highest education level: Not on file  Occupational History  . Occupation: Disability  Social Needs  . Financial resource strain: Not on file  . Food insecurity    Worry: Not on file    Inability: Not on file  . Transportation needs    Medical: Not on file    Non-medical: Not on file  Tobacco Use  . Smoking status: Current Every Day Smoker    Packs/day: 2.00    Types: Cigarettes  . Smokeless tobacco: Never Used  . Tobacco comment: Unknown   Substance and Sexual Activity   . Alcohol use: Yes    Alcohol/week: 0.0 standard drinks    Comment: Unknown   . Drug use: Yes    Frequency: 7.0 times per week    Types: Marijuana    Comment: Unknown   . Sexual activity: Not on file  Lifestyle  . Physical activity    Days per week: Not on file    Minutes per session: Not on file  . Stress: Not on file  Relationships  . Social Musicianconnections    Talks on phone: Not on file    Gets together: Not on file    Attends religious service: Not on file    Active member of club or organization: Not on file    Attends meetings of clubs or organizations: Not on file    Relationship status: Not on file  Other Topics Concern  . Not on file  Social History Narrative   Pt discharged from [facility] on 10/10/[facility], was [place] to a hotel in [place] by [facility]T provider[facility] [place], [place].      Hospital Course:  From admission H&P: This is the latest of multiple encounters with our healthcare system for [name], he is [age], this admission was initiated on 10/27 however he had presented to the emergency department on 10/21 later on 10/24 and 25 with intoxication. The patient is normally followed by his act team,  [facility], and he has been with him approximately a year.  About 2 months ago he moved out of his sister's home and has been homeless since that time, off of his medication.  He was recently at [facility] discharge approximately 2 weeks ago but continued to be threatening, delusional and paranoid according to the assessment team notes. On my interview he is manic pressured and rambling very much seeking benzodiazepine stating "I been on Klonopin since I was in prison" and "I need Ativan"-however current med list only includes Thorazine 400 mg a day, Depakote 750 mg twice daily and trazodone 50 mg at bedtime. Clearly he is a candidate for long-acting injectable medication. Currently alert oriented pressured and rambling drug-seeking knows the date but not the  date knows the month and year is somewhat paranoid and talks in disparaging terms about healthcare providers current and past but no obvious delusional material expressed and denies auditory or visual hallucinations denies thoughts of harming self or others/drug screen positive for cannabis.  Peter Wilson was admitted for manic and psychotic behaviors. UDS positive for THC. He remained on the [facility] unit for six days. He was started on Depakote, Risperdal, Cogentin, Neurontin, and Restoril. He participated in group therapy on the unit. He responded well to treatment with no adverse effects reported. He has shown improvement with calmer mood and affect and improved sleep and interaction. He denies any SI/HI/AVH and contracts for safety. He is discharging on the medications listed below. Patient requested referral for new ACT team during admission. He agrees to follow up at [facility] ACT team (see below). Patient is provided with prescriptions for medications upon discharge. His case worker is picking him up for discharge home.  Physical Findings: AIMS:  , ,  ,  ,    CIWA:    COWS:     Musculoskeletal: Strength & Muscle Tone: within normal limits Gait & Station: normal Patient leans: N/A  Psychiatric Specialty Exam: Physical Exam  Nursing note and vitals reviewed. Constitutional: He is oriented to person, place, and time. He appears well-developed and well-nourished.  Cardiovascular: Normal rate.  Respiratory: Effort normal.  Neurological: He is alert and oriented to person, place, and time.    Review of Systems  Constitutional: Negative.   Respiratory: Negative for cough and shortness of breath.   Cardiovascular: Negative for chest pain.  Psychiatric/Behavioral: Positive for substance abuse. Negative for depression, hallucinations and suicidal ideas. The patient is not nervous/anxious and does not have insomnia.     Blood pressure 117/82, pulse 85, temperature (!) 97.5 F (36.4 C),  temperature source Oral, resp. rate 18, height 6' (1.829 m), weight 68 kg, SpO2 100 %.Body mass index is 20.34 kg/m.  See MD's discharge SRA      Has this patient used any form of tobacco in the last 30 days? (Cigarettes, Smokeless Tobacco, Cigars, and/or Pipes)  No  Blood Alcohol level:  Lab Results  Component Value Date   ETH <10 10/21/2019   ETH 14 (H) 10/18/2019    Metabolic Disorder Labs:  Lab Results  Component Value Date   HGBA1C 4.6 (L) 04/02/2018   MPG 85.32 04/02/2018   MPG 91 04/11/2017   Lab Results  Component Value Date   PROLACTIN 26.4 (H) 04/11/2017   PROLACTIN 40.2 (H) 01/27/2016   Lab Results  Component Value Date   CHOL 202 (H) 04/02/2018   TRIG 102 04/02/2018   HDL 46 04/02/2018   CHOLHDL 4.4 04/02/2018   VLDL  20 04/02/2018   LDLCALC 136 (H) 04/02/2018   LDLCALC 167 (H) 04/11/2017    See Psychiatric Specialty Exam and Suicide Risk Assessment completed by Attending Physician prior to discharge.  Discharge destination:  Home  Is patient on multiple antipsychotic therapies at discharge:  No   Has Patient had three or more failed trials of antipsychotic monotherapy by history:  No  Recommended Plan for Multiple Antipsychotic Therapies: NA  Discharge Instructions    Discharge instructions   Complete by: As directed    Patient is instructed to take all prescribed medications as recommended. Report any side effects or adverse reactions to your outpatient psychiatrist. Patient is instructed to abstain from alcohol and illegal drugs while on prescription medications. In the event of worsening symptoms, patient is instructed to call the crisis hotline, 911, or go to the nearest emergency department for evaluation and treatment.     Allergies as of 10/28/2019      Reactions   Penicillins Rash   Did it involve swelling of the face/tongue/throat, SOB, or low BP? U Did it involve sudden or severe rash/hives, skin peeling, or any reaction on the inside  of your mouth or nose? Y  Did you need to seek medical attention at a hospital or doctor's office? Y When did it last happen?childhood If all above answers are "NO", may proceed with cephalosporin use.      Medication List    STOP taking these medications   chlorproMAZINE 200 MG tablet Commonly known as: THORAZINE   haloperidol decanoate 100 MG/ML injection Commonly known as: HALDOL DECANOATE   traZODone 50 MG tablet Commonly known as: DESYREL     TAKE these medications     Indication  benztropine 1 MG tablet Commonly known as: COGENTIN Take 1 tablet (1 mg total) by mouth 2 (two) times daily.  Indication: Extrapyramidal Reaction caused by Medications   divalproex 250 MG DR tablet Commonly known as: DEPAKOTE Take 3 tablets (750 mg total) by mouth 2 (two) times daily. What changed:   medication strength  how much to take  when to take this  Another medication with the same name was removed. Continue taking this medication, and follow the directions you see here.  Indication: Manic Phase of Manic-Depression   gabapentin 300 MG capsule Commonly known as: NEURONTIN Take 2 capsules (600 mg total) by mouth 3 (three) times daily.  Indication: Neuropathic Pain   risperiDONE 3 MG tablet Commonly known as: RISPERDAL Take 1 tablet (3 mg total) by mouth 3 (three) times daily.  Indication: Schizophrenia   temazepam 30 MG capsule Commonly known as: RESTORIL Take 1 capsule (30 mg total) by mouth at bedtime.  Indication: [place], [facility] Follow up.   Why: A referral for ACTT services has been made. ACTT Admissions will contact you after discharge with follow up information.  Contact information: [address] [place] [address] [phone]           Follow-up recommendations: Activity as tolerated. Diet as recommended by primary care physician. Keep all scheduled follow-up appointments as  recommended.   Comments:   Patient is instructed to take all prescribed medications as recommended. Report any side effects or adverse reactions to your outpatient psychiatrist. Patient is instructed to abstain from alcohol and illegal drugs while on prescription medications. In the event of worsening symptoms, patient is instructed to call the crisis hotline, 911, or go to  the nearest emergency department for evaluation and treatment.  Signed: Aldean Baker, NP 10/28/2019, 10:19 AM

## 2019-10-28 NOTE — Progress Notes (Signed)
  [facility] Adult Case Management Discharge Plan :  Will you be returning to the same living situation after discharge:  No.; Patient stated that his case worker is going to put him up in a hotel until he can find more long-term housing. At discharge, do you have transportation home?: Yes,  Patient's case worker is going to pick up the patient Do you have the ability to pay for your medications: Yes,  medicare  Release of information consent forms completed and in the chart;  Patient's signature needed at discharge.  Patient to Follow up at: Follow-up Information    [facility], [facility] Follow up.   Why: A referral for ACTT services has been made. ACTT Admissions will contact you after discharge with follow up information.  Contact information: [address] [place] [address] [phone]           Next level of care provider has access to [place] and Suicide Prevention discussed: No.; CSW attempted twice with patient's cousin     Has patient been referred to the Quitline?: Patient refused referral  Patient has been referred for addiction treatment: Yes  Trecia Rogers, [name] 10/28/2019, 9:28 AM

## 2019-11-05 ENCOUNTER — Encounter (HOSPITAL_COMMUNITY): Payer: Self-pay | Admitting: Emergency Medicine

## 2019-11-05 ENCOUNTER — Observation Stay (HOSPITAL_COMMUNITY)
Admission: RE | Admit: 2019-11-05 | Discharge: 2019-11-06 | Disposition: A | Discharge status: Home / Self Care | Payer: Medicare Other | Source: Home / Self Care | Attending: Psychiatry | Admitting: Psychiatry

## 2019-11-05 ENCOUNTER — Other Ambulatory Visit: Payer: Self-pay

## 2019-11-05 DIAGNOSIS — F17210 Nicotine dependence, cigarettes, uncomplicated: Secondary | ICD-10-CM | POA: Diagnosis not present

## 2019-11-05 DIAGNOSIS — Z20828 Contact with and (suspected) exposure to other viral communicable diseases: Secondary | ICD-10-CM | POA: Diagnosis not present

## 2019-11-05 DIAGNOSIS — F250 Schizoaffective disorder, bipolar type: Secondary | ICD-10-CM

## 2019-11-05 DIAGNOSIS — J45909 Unspecified asthma, uncomplicated: Secondary | ICD-10-CM | POA: Diagnosis not present

## 2019-11-05 DIAGNOSIS — F259 Schizoaffective disorder, unspecified: Secondary | ICD-10-CM | POA: Diagnosis present

## 2019-11-05 DIAGNOSIS — R202 Paresthesia of skin: Secondary | ICD-10-CM | POA: Diagnosis not present

## 2019-11-05 DIAGNOSIS — F1210 Cannabis abuse, uncomplicated: Secondary | ICD-10-CM | POA: Diagnosis not present

## 2019-11-05 DIAGNOSIS — Z79899 Other long term (current) drug therapy: Secondary | ICD-10-CM | POA: Diagnosis not present

## 2019-11-05 DIAGNOSIS — R0789 Other chest pain: Secondary | ICD-10-CM | POA: Diagnosis not present

## 2019-11-05 DIAGNOSIS — F1994 Other psychoactive substance use, unspecified with psychoactive substance-induced mood disorder: Secondary | ICD-10-CM | POA: Diagnosis present

## 2019-11-05 DIAGNOSIS — R519 Headache, unspecified: Secondary | ICD-10-CM | POA: Diagnosis present

## 2019-11-05 LAB — SARS CORONAVIRUS 2 BY RT PCR (HOSPITAL ORDER, PERFORMED IN ~~LOC~~ HOSPITAL LAB): SARS Coronavirus 2: NEGATIVE

## 2019-11-05 MED ORDER — ZIPRASIDONE MESYLATE 20 MG IM SOLR
20.0000 mg | INTRAMUSCULAR | Status: DC | PRN
  Filled 2019-11-05: qty 20

## 2019-11-05 MED ORDER — RISPERIDONE 3 MG PO TABS
3.0000 mg | ORAL_TABLET | Freq: Three times a day (TID) | ORAL | Status: DC
  Administered 2019-11-05 – 2019-11-06 (×2): 3 mg via ORAL
  Filled 2019-11-05 (×2): qty 1

## 2019-11-05 MED ORDER — TEMAZEPAM 15 MG PO CAPS
30.0000 mg | ORAL_CAPSULE | Freq: Every day | ORAL | Status: DC
  Administered 2019-11-05: 22:00:00 30 mg via ORAL
  Filled 2019-11-05: qty 2

## 2019-11-05 MED ORDER — DIVALPROEX SODIUM 500 MG PO DR TAB
750.0000 mg | DELAYED_RELEASE_TABLET | Freq: Two times a day (BID) | ORAL | Status: DC
  Administered 2019-11-05 – 2019-11-06 (×2): 750 mg via ORAL
  Filled 2019-11-05 (×2): qty 1

## 2019-11-05 MED ORDER — GABAPENTIN 300 MG PO CAPS
600.0000 mg | ORAL_CAPSULE | Freq: Three times a day (TID) | ORAL | Status: DC
  Administered 2019-11-05 – 2019-11-06 (×2): 600 mg via ORAL
  Filled 2019-11-05 (×2): qty 2

## 2019-11-05 MED ORDER — BENZTROPINE MESYLATE 1 MG PO TABS
1.0000 mg | ORAL_TABLET | Freq: Two times a day (BID) | ORAL | Status: DC
  Administered 2019-11-05 – 2019-11-06 (×2): 1 mg via ORAL
  Filled 2019-11-05 (×2): qty 1

## 2019-11-05 MED ORDER — OLANZAPINE 10 MG PO TBDP
10.0000 mg | ORAL_TABLET | Freq: Three times a day (TID) | ORAL | Status: DC | PRN
  Filled 2019-11-05: qty 1

## 2019-11-05 MED ORDER — LORAZEPAM 1 MG PO TABS
1.0000 mg | ORAL_TABLET | ORAL | Status: DC | PRN
  Filled 2019-11-05: qty 1
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
## 2019-11-05 NOTE — Plan of Care (Signed)
[place] Observation Crisis Plan  Reason for Crisis Plan:  Crisis Stabilization   Plan of Care:  Referral for Inpatient Hospitalization  Family Support:      Current Living Environment:  Living Arrangements: Alone  Insurance:   Hospital Account    Name Acct ID Class Status Primary Coverage   [place], [place] [id number] [place] - MEDICARE PART A AND B        Guarantor Account (for Hospital Account [id number])    Name Relation to Pt Service Area Active? Acct Type   [name] Self [facility] Yes Behavioral Health   Address Phone       [address] [place],  [place] [address] [phone])          Coverage Information (for Hospital Account [id number])    F/O Payor/Plan Precert #   MEDICARE/MEDICARE PART A AND B    Subscriber Subscriber #   [place], [place] [id number]   Address Phone   [address] [place], [place] [address]       Legal Guardian:     Primary Care Provider:  Jodi Marble, MD  Current Outpatient Providers:  ACT  Psychiatrist:  Name of Psychiatrist: Neither [facility] or [facility] crisis line would claim pt. He is stuck inbetween & has neither  Counselor/Therapist:  Name of Therapist: Neither [facility] or [facility] crisis line would claim pt. He is stuck inbetween & has neither  Compliant with Medications:  No  Additional Information:   Jesusita Oka 11/11/202011:40 PM

## 2019-11-06 ENCOUNTER — Emergency Department (HOSPITAL_COMMUNITY): Payer: Medicare Other

## 2019-11-06 ENCOUNTER — Emergency Department (HOSPITAL_COMMUNITY)
Admission: EM | Admit: 2019-11-06 | Discharge: 2019-11-06 | Disposition: A | Discharge status: Home / Self Care | Payer: Medicare Other | Attending: Emergency Medicine | Admitting: Emergency Medicine

## 2019-11-06 ENCOUNTER — Other Ambulatory Visit: Payer: Self-pay

## 2019-11-06 ENCOUNTER — Encounter (HOSPITAL_COMMUNITY): Payer: Self-pay | Admitting: Obstetrics and Gynecology

## 2019-11-06 DIAGNOSIS — J45909 Unspecified asthma, uncomplicated: Secondary | ICD-10-CM | POA: Insufficient documentation

## 2019-11-06 DIAGNOSIS — F419 Anxiety disorder, unspecified: Secondary | ICD-10-CM | POA: Diagnosis present

## 2019-11-06 DIAGNOSIS — R202 Paresthesia of skin: Secondary | ICD-10-CM | POA: Insufficient documentation

## 2019-11-06 DIAGNOSIS — Z79899 Other long term (current) drug therapy: Secondary | ICD-10-CM | POA: Diagnosis not present

## 2019-11-06 DIAGNOSIS — Z880 Allergy status to penicillin: Secondary | ICD-10-CM | POA: Diagnosis not present

## 2019-11-06 DIAGNOSIS — F259 Schizoaffective disorder, unspecified: Secondary | ICD-10-CM | POA: Insufficient documentation

## 2019-11-06 DIAGNOSIS — F17210 Nicotine dependence, cigarettes, uncomplicated: Secondary | ICD-10-CM | POA: Insufficient documentation

## 2019-11-06 DIAGNOSIS — Z20828 Contact with and (suspected) exposure to other viral communicable diseases: Secondary | ICD-10-CM | POA: Insufficient documentation

## 2019-11-06 DIAGNOSIS — Z791 Long term (current) use of non-steroidal anti-inflammatories (NSAID): Secondary | ICD-10-CM | POA: Diagnosis not present

## 2019-11-06 DIAGNOSIS — R0789 Other chest pain: Secondary | ICD-10-CM | POA: Insufficient documentation

## 2019-11-06 DIAGNOSIS — F1210 Cannabis abuse, uncomplicated: Secondary | ICD-10-CM | POA: Insufficient documentation

## 2019-11-06 LAB — RAPID URINE DRUG SCREEN, HOSP PERFORMED
Amphetamines: NOT DETECTED
Barbiturates: NOT DETECTED
Benzodiazepines: POSITIVE — AB
Cocaine: NOT DETECTED
Opiates: NOT DETECTED
Tetrahydrocannabinol: POSITIVE — AB

## 2019-11-06 LAB — CBC
HCT: 41.3 % (ref 39.0–52.0)
HCT: 42.5 % (ref 39.0–52.0)
Hemoglobin: 12.4 g/dL — ABNORMAL LOW (ref 13.0–17.0)
Hemoglobin: 12.7 g/dL — ABNORMAL LOW (ref 13.0–17.0)
MCH: 29 pg (ref 26.0–34.0)
MCH: 28.8 pg (ref 26.0–34.0)
MCHC: 30 g/dL (ref 30.0–36.0)
MCHC: 29.9 g/dL — ABNORMAL LOW (ref 30.0–36.0)
MCV: 96.5 fL (ref 80.0–100.0)
MCV: 96.4 fL (ref 80.0–100.0)
Platelets: 251 10*3/uL (ref 150–400)
Platelets: 263 10*3/uL (ref 150–400)
RBC: 4.28 MIL/uL (ref 4.22–5.81)
RBC: 4.41 MIL/uL (ref 4.22–5.81)
RDW: 14.5 % (ref 11.5–15.5)
RDW: 14.4 % (ref 11.5–15.5)
WBC: 5 10*3/uL (ref 4.0–10.5)
WBC: 6 10*3/uL (ref 4.0–10.5)
nRBC: 0 % (ref 0.0–0.2)
nRBC: 0 % (ref 0.0–0.2)

## 2019-11-06 LAB — BASIC METABOLIC PANEL
Anion gap: 10 (ref 5–15)
BUN: 12 mg/dL (ref 6–20)
CO2: 29 mmol/L (ref 22–32)
Calcium: 9.5 mg/dL (ref 8.9–10.3)
Chloride: 102 mmol/L (ref 98–111)
Creatinine, Ser: 0.84 mg/dL (ref 0.61–1.24)
GFR calc Af Amer: >60 mL/min (ref >60)
GFR calc non Af Amer: >60 mL/min (ref >60)
Glucose, Bld: 83 mg/dL (ref 70–99)
Potassium: 4.6 mmol/L (ref 3.5–5.1)
Sodium: 141 mmol/L (ref 135–145)

## 2019-11-06 LAB — URINALYSIS, COMPLETE (UACMP) WITH MICROSCOPIC
Bilirubin Urine: NEGATIVE
Glucose, UA: NEGATIVE mg/dL
Hgb urine dipstick: NEGATIVE
Ketones, ur: NEGATIVE mg/dL
Leukocytes,Ua: NEGATIVE
Nitrite: NEGATIVE
Protein, ur: NEGATIVE mg/dL
Specific Gravity, Urine: 1.01 (ref 1.005–1.030)
pH: 6 (ref 5.0–8.0)

## 2019-11-06 LAB — ETHANOL: Alcohol, Ethyl (B): <10 mg/dL (ref <10)

## 2019-11-06 LAB — COMPREHENSIVE METABOLIC PANEL
ALT: 15 U/L (ref 0–44)
AST: 25 U/L (ref 15–41)
Albumin: 3.6 g/dL (ref 3.5–5.0)
Alkaline Phosphatase: 59 U/L (ref 38–126)
Anion gap: 8 (ref 5–15)
BUN: 12 mg/dL (ref 6–20)
CO2: 27 mmol/L (ref 22–32)
Calcium: 9.1 mg/dL (ref 8.9–10.3)
Chloride: 104 mmol/L (ref 98–111)
Creatinine, Ser: 0.86 mg/dL (ref 0.61–1.24)
GFR calc Af Amer: >60 mL/min (ref >60)
GFR calc non Af Amer: >60 mL/min (ref >60)
Glucose, Bld: 91 mg/dL (ref 70–99)
Potassium: 4.4 mmol/L (ref 3.5–5.1)
Sodium: 139 mmol/L (ref 135–145)
Total Bilirubin: 0.4 mg/dL (ref 0.3–1.2)
Total Protein: 7.2 g/dL (ref 6.5–8.1)

## 2019-11-06 LAB — VALPROIC ACID LEVEL: Valproic Acid Lvl: 57 ug/mL (ref 50.0–100.0)

## 2019-11-06 LAB — TROPONIN I (HIGH SENSITIVITY): Troponin I (High Sensitivity): 4 ng/L (ref <18)

## 2019-11-06 MED ORDER — DIVALPROEX SODIUM 250 MG PO DR TAB: 750.0000 mg | DELAYED_RELEASE_TABLET | Freq: Two times a day (BID) | ORAL | 0 refills | Status: DC

## 2019-11-06 MED ORDER — IBUPROFEN 800 MG PO TABS: 800.0000 mg | ORAL_TABLET | Freq: Three times a day (TID) | ORAL | 0 refills | Status: DC

## 2019-11-06 MED ORDER — IBUPROFEN 800 MG PO TABS
800.0000 mg | ORAL_TABLET | Freq: Once | ORAL | Status: AC
  Administered 2019-11-06: 800 mg via ORAL
  Filled 2019-11-06: qty 1

## 2019-11-06 MED ORDER — SODIUM CHLORIDE 0.9% FLUSH: 3.0000 mL | Freq: Once | INTRAVENOUS | Status: DC
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
## 2019-11-06 NOTE — BH Assessment (Signed)
[place] Assessment Progress Note  Per Letitia Libra, NP, this pt does not require psychiatric hospitalization at this time.  Pt is to be discharged from the [facility] Observation Unit.  Pt's EPIC record shows that he was recently discharged from [facility] and was to follow up with [facility] for an intake appointment for ACT Team services.  At 11:45 I called [facility] and spoke to [place].  She reports that his appointment was scheduled for this morning, but they will see him any time before 15:00 today.  Discharge instructions advise pt to go directly to [facility] from the Observation Unit..  Pt's nurse, [place], has been notified.  Jalene Mullet, [place] Triage Specialist [phone]
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
## 2019-11-06 NOTE — Discharge Instructions (Addendum)
Follow-up next week if any problems.  Your tests are unremarkable and the Motrin will help with the muscle pain in your chest

## 2019-11-06 NOTE — ED Notes (Signed)
Patient requested something for a headache.
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
## 2019-11-06 NOTE — Progress Notes (Signed)
Transportation has been arranged for pt to be transported to [facility] by [facility]. Pt is scheduled to be pickup up within the next 20 min.   Audree Camel, [name], [place] Disposition [place]/TTS [phone]

## 2019-11-06 NOTE — Discharge Instructions (Signed)
For your behavioral health needs, you advised to follow up with [facility].  You have an intake appointment with them today.  They will see you any time before 3:00 pm:       [facility]      [address], [address], [place] [address]      [phone]

## 2019-11-06 NOTE — Progress Notes (Signed)
D: Pt A & O X 4. Presents calm with pressured and tangential speech. Denies SI, HI, AVH and pain at this time. Pt d/c as ordered. Picked up by an Mining engineer outside facility. A: D/C instructions reviewed with pt including instructions to follow up with his ACTT ([facility]) compliance encouraged. All belongings from locker 59 returned to pt at time of departure. Scheduled medications given with verbal education and effects monitored. Safety checks maintained without incident till time of d/c.  R: Pt receptive to care. Compliant with medications when offered. Denies adverse drug reactions when assessed. Verbalized understanding related to d/c instructions. Signed belonging sheet in agreement with items received from locker. Ambulatory with a steady gait. Appears to be in no physical distress at time of departure.
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
## 2019-11-06 NOTE — ED Provider Notes (Signed)
[place] DEPT Provider Note   CSN: [id number] Arrival date & time: 11/06/19  1730     History   Chief Complaint Chief Complaint  Patient presents with  . Headache  . knot of chest    HPI Peter Wilson is a 43 y.o. male.     Patient complains of pain in his left chest.  He also says he has numbness on his left side  The history is provided by the patient. No language interpreter was used.  Chest Pain Pain location:  L chest Pain quality: aching   Pain radiates to:  Does not radiate Pain severity:  Moderate Onset quality:  Sudden Timing:  Constant Progression:  Worsening Chronicity:  New Context: not breathing   Relieved by:  Nothing Associated symptoms: no abdominal pain, no back pain, no cough, no fatigue and no headache     Past Medical History:  Diagnosis Date  . Marijuana abuse   . Schizophrenia, paranoid ([place])   . Tardive dyskinesia   . Tobacco abuse     Patient Active Problem List   Diagnosis Date Noted  . Schizoaffective disorder ([place]) 11/05/2019  . Substance induced mood disorder ([place]) 10/13/2019  . Aggressive behavior   . Involuntary commitment   . Tremor 04/24/2018  . Schizophrenia ([place]) 04/01/2018  . Noncompliance 04/10/2017  . Asthma 07/13/2016  . Schizoaffective disorder, bipolar type ([place]) 06/27/2016  . Antisocial traits 09/15/2015  . Tobacco use disorder 09/14/2015  . Cannabis use disorder, moderate, dependence ([place]) 09/14/2015    Past Surgical History:  Procedure Laterality Date  . NO PAST SURGERIES          Home Medications    Prior to Admission medications   Medication Sig Start Date End Date Taking? Authorizing Provider  benztropine (COGENTIN) 1 MG tablet Take 1 tablet (1 mg total) by mouth 2 (two) times daily. 10/28/19 12/27/19 Yes Connye Burkitt, NP  chlorproMAZINE (THORAZINE) 200 MG tablet Take 200 mg by mouth 3 (three) times daily. 10/28/19  Yes [provider]  divalproex  (DEPAKOTE) 250 MG DR tablet Take 3 tablets (750 mg total) by mouth 2 (two) times daily. 11/06/19  Yes Emmaline Kluver, FNP  divalproex (DEPAKOTE) 500 MG DR tablet Take 500 mg by mouth 2 (two) times daily. 10/28/19  Yes [provider]  gabapentin (NEURONTIN) 300 MG capsule Take 2 capsules (600 mg total) by mouth 3 (three) times daily. 10/28/19  Yes Connye Burkitt, NP  temazepam (RESTORIL) 30 MG capsule Take 1 capsule (30 mg total) by mouth at bedtime. 10/28/19  Yes Connye Burkitt, NP  traZODone (DESYREL) 50 MG tablet Take 50 mg by mouth at bedtime. 10/28/19  Yes [provider]  ibuprofen (ADVIL) 800 MG tablet Take 1 tablet (800 mg total) by mouth 3 (three) times daily. 11/06/19   Milton Ferguson, MD  risperiDONE (RISPERDAL) 3 MG tablet Take 1 tablet (3 mg total) by mouth 3 (three) times daily. 10/28/19   Connye Burkitt, NP    Family History No family history on file.  Social History Social History   Tobacco Use  . Smoking status: Current Every Day Smoker    Packs/day: 2.00    Types: Cigarettes  . Smokeless tobacco: Never Used  . Tobacco comment: Unknown   Substance Use Topics  . Alcohol use: Yes    Alcohol/week: 0.0 standard drinks    Comment: Unknown   . Drug use: Yes    Frequency: 7.0 times per week  Types: Marijuana    Comment: Unknown      Allergies   Penicillins   Review of Systems Review of Systems  Constitutional: Negative for appetite change and fatigue.  HENT: Negative for congestion, ear discharge and sinus pressure.   Eyes: Negative for discharge.  Respiratory: Negative for cough.   Cardiovascular: Positive for chest pain.  Gastrointestinal: Negative for abdominal pain and diarrhea.  Genitourinary: Negative for frequency and hematuria.  Musculoskeletal: Negative for back pain.  Skin: Negative for rash.  Neurological: Negative for seizures and headaches.  Psychiatric/Behavioral: Negative for hallucinations.     Physical Exam Updated Vital  Signs BP 108/84   Pulse 66   Temp 98.6 F (37 C) (Oral)   Resp 16   SpO2 98%   Physical Exam Vitals signs reviewed.  Constitutional:      Appearance: He is well-developed.  HENT:     Head: Normocephalic.     Nose: Nose normal.  Eyes:     General: No scleral icterus.    Conjunctiva/sclera: Conjunctivae normal.  Neck:     Musculoskeletal: Neck supple.     Thyroid: No thyromegaly.  Cardiovascular:     Rate and Rhythm: Normal rate and regular rhythm.     Heart sounds: No murmur. No friction rub. No gallop.   Pulmonary:     Breath sounds: No stridor. No wheezing or rales.  Chest:     Chest wall: No tenderness.  Abdominal:     General: There is no distension.     Tenderness: There is no abdominal tenderness. There is no rebound.  Musculoskeletal: Normal range of motion.     Comments: Tenderness to left chest  Lymphadenopathy:     Cervical: No cervical adenopathy.  Skin:    Findings: No erythema or rash.  Neurological:     Mental Status: He is oriented to person, place, and time.     Motor: No abnormal muscle tone.     Coordination: Coordination normal.  Psychiatric:        Behavior: Behavior normal.      ED Treatments / Results  Labs (all labs ordered are listed, but only abnormal results are displayed) Labs Reviewed  CBC - Abnormal; Notable for the following components:      Result Value   Hemoglobin 12.7 (*)    MCHC 29.9 (*)    All other components within normal limits  BASIC METABOLIC PANEL  TROPONIN I (HIGH SENSITIVITY)  TROPONIN I (HIGH SENSITIVITY)    EKG EKG Interpretation  Date/Time:  Thursday November 06 2019 17:47:16 EST Ventricular Rate:  80 PR Interval:    QRS Duration: 95 QT Interval:  360 QTC Calculation: 416 R Axis:   -11 Text Interpretation: Sinus rhythm since last tracing no significant change Confirmed by Mancel BaleWentz, Elliott [phone]([id number]) on 11/06/2019 5:52:26 PM   Radiology Dg Chest 2 View  Result Date: 11/06/2019 CLINICAL DATA:  Chest  pain. EXAM: CHEST - 2 VIEW COMPARISON:  Radiograph 10/22/2018 FINDINGS: The cardiomediastinal contours are normal. Minimal streaky opacity in the left mid lung. Pulmonary vasculature is normal. No consolidation, pleural effusion, or pneumothorax. No acute osseous abnormalities are seen. IMPRESSION: Minimal streaky opacity in the left mid lung, suggesting atelectasis. Otherwise negative. Electronically Signed   By: Narda RutherfordMelanie  Sanford M.D.   On: 11/06/2019 18:13    Procedures Procedures (including critical care time)  Medications Ordered in ED Medications  sodium chloride flush (NS) 0.9 % injection 3 mL (has no administration in time range)  ibuprofen (ADVIL)  tablet 800 mg (800 mg Oral Given 11/06/19 2207)     Initial Impression / Assessment and Plan / ED Course  I have reviewed the triage vital signs and the nursing notes.  Pertinent labs & imaging results that were available during my care of the patient were reviewed by me and considered in my medical decision making (see chart for details).        Labs unremarkable chest x-ray negative.  Patient has chest wall pain.  He will be given Motrin and will follow-up as needed  Final Clinical Impressions(s) / ED Diagnoses   Final diagnoses:  Chest wall pain  Paresthesias    ED Discharge Orders         Ordered    ibuprofen (ADVIL) 800 MG tablet  3 times daily     11/06/19 2208           Bethann Berkshire, MD 11/06/19 2211
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
## 2019-11-07 ENCOUNTER — Other Ambulatory Visit: Payer: Self-pay

## 2019-11-07 ENCOUNTER — Observation Stay (HOSPITAL_COMMUNITY)
Admission: RE | Admit: 2019-11-07 | Discharge: 2019-11-07 | Disposition: A | Discharge status: Home / Self Care | Payer: Medicare Other | Attending: Psychiatry | Admitting: Psychiatry

## 2019-11-07 ENCOUNTER — Encounter (HOSPITAL_COMMUNITY): Payer: Self-pay | Admitting: Emergency Medicine

## 2019-11-07 DIAGNOSIS — Z79899 Other long term (current) drug therapy: Secondary | ICD-10-CM | POA: Insufficient documentation

## 2019-11-07 DIAGNOSIS — F259 Schizoaffective disorder, unspecified: Secondary | ICD-10-CM | POA: Diagnosis not present

## 2019-11-07 DIAGNOSIS — F250 Schizoaffective disorder, bipolar type: Secondary | ICD-10-CM | POA: Diagnosis not present

## 2019-11-07 DIAGNOSIS — Z20828 Contact with and (suspected) exposure to other viral communicable diseases: Secondary | ICD-10-CM | POA: Insufficient documentation

## 2019-11-07 DIAGNOSIS — Z791 Long term (current) use of non-steroidal anti-inflammatories (NSAID): Secondary | ICD-10-CM | POA: Insufficient documentation

## 2019-11-07 DIAGNOSIS — Z880 Allergy status to penicillin: Secondary | ICD-10-CM | POA: Insufficient documentation

## 2019-11-07 DIAGNOSIS — F17210 Nicotine dependence, cigarettes, uncomplicated: Secondary | ICD-10-CM | POA: Insufficient documentation

## 2019-11-07 LAB — PROLACTIN: Prolactin: 28 ng/mL — ABNORMAL HIGH (ref 4.0–15.2)

## 2019-11-07 LAB — SARS CORONAVIRUS 2 BY RT PCR (HOSPITAL ORDER, PERFORMED IN ~~LOC~~ HOSPITAL LAB): SARS Coronavirus 2: NEGATIVE

## 2019-11-07 MED ORDER — HYDROXYZINE HCL 50 MG PO TABS
50.0000 mg | ORAL_TABLET | Freq: Once | ORAL | Status: AC
  Administered 2019-11-07: 50 mg via ORAL
  Filled 2019-11-07: qty 1

## 2019-11-07 MED ORDER — TRAZODONE HCL 100 MG PO TABS
100.0000 mg | ORAL_TABLET | Freq: Once | ORAL | Status: AC
  Administered 2019-11-07: 100 mg via ORAL
  Filled 2019-11-07: qty 1

## 2019-11-07 MED ORDER — DIVALPROEX SODIUM 250 MG PO DR TAB
750.0000 mg | DELAYED_RELEASE_TABLET | Freq: Two times a day (BID) | ORAL | Status: DC
  Filled 2019-11-07: qty 30

## 2019-11-07 MED ORDER — MAGNESIUM HYDROXIDE 400 MG/5ML PO SUSP: 30.0000 mL | Freq: Every day | ORAL | Status: DC | PRN

## 2019-11-07 MED ORDER — TEMAZEPAM 15 MG PO CAPS: 30.0000 mg | ORAL_CAPSULE | Freq: Every day | ORAL | Status: DC

## 2019-11-07 MED ORDER — BENZTROPINE MESYLATE 1 MG PO TABS
1.0000 mg | ORAL_TABLET | Freq: Two times a day (BID) | ORAL | Status: DC
  Filled 2019-11-07: qty 10

## 2019-11-07 MED ORDER — ACETAMINOPHEN 325 MG PO TABS: 650.0000 mg | ORAL_TABLET | Freq: Four times a day (QID) | ORAL | Status: DC | PRN

## 2019-11-07 MED ORDER — ALUM & MAG HYDROXIDE-SIMETH 200-200-20 MG/5ML PO SUSP: 30.0000 mL | ORAL | Status: DC | PRN

## 2019-11-07 MED ORDER — RISPERIDONE 3 MG PO TABS
3.0000 mg | ORAL_TABLET | Freq: Three times a day (TID) | ORAL | Status: DC
  Filled 2019-11-07: qty 15

## 2019-11-07 MED ORDER — GABAPENTIN 600 MG PO TABS
600.0000 mg | ORAL_TABLET | Freq: Three times a day (TID) | ORAL | Status: DC
  Filled 2019-11-07: qty 15

## 2019-11-07 NOTE — H&P (Addendum)
Behavioral Health Medical Screening Exam  Peter Wilson is an 43 y.o. male came in as a walk-in with complains of anxiety. Pt reports "I don't feel quite right, I need to be checked". Pt was discharged from [facility] yesterday after seeing Dr. Sheppard Evens. Pt reports he took ecstasy pills last night. Pt is tearful and states he has not slept much and has been walking down the highway. Reports that he sees bullets flying all over the place when he tries to sleep. Pt states that he feels guilty about his family getting killed in 2016. States that he is having issues getting his medications from his ACTT team. States that someone is out to get him. He states that he cannot contract for safety at this time.   Total Time spent with patient: 30 minutes  Psychiatric Specialty Exam: Physical Exam  Constitutional: He is oriented to person, place, and time. He appears well-developed.  HENT:  Head: Normocephalic.  Eyes: Pupils are equal, round, and reactive to light.  Neck: Normal range of motion.  Respiratory: Effort normal.  Musculoskeletal: Normal range of motion.  Neurological: He is alert and oriented to person, place, and time.  Skin: Skin is warm and dry.  Psychiatric: His mood appears anxious. His affect is angry. His speech is tangential. He is hyperactive. Thought content is paranoid. Cognition and memory are normal. He expresses impulsivity.    Review of Systems  Psychiatric/Behavioral: Positive for hallucinations and substance abuse. The patient is nervous/anxious and has insomnia.   All other systems reviewed and are negative.   There were no vitals taken for this visit.There is no height or weight on file to calculate BMI.  General Appearance: Fairly Groomed  Eye Contact:  Good  Speech:  Pressured  Volume:  Increased  Mood:  Angry, Anxious, Hopeless, Irritable and Worthless  Affect:  Congruent and Tearful  Thought Process:  Descriptions of Associations: Tangential  Orientation:  Full (Time,  Place, and Person)  Thought Content:  Hallucinations: Auditory  Suicidal Thoughts:  Yes.  without intent/plan  Homicidal Thoughts:  Yes.  without intent/plan  Memory:  Recent;   Fair  Judgement:  Fair  Insight:  Fair  Psychomotor Activity:  Increased  Concentration: Concentration: Good  Recall:  [place] of Knowledge:Good  Language: Good  Akathisia:  No  Handed:  Right  AIMS (if indicated):     Assets:  Communication Skills  Sleep:   Poor    Musculoskeletal: Strength & Muscle Tone: within normal limits Gait & Station: normal Patient leans: Right  There were no vitals taken for this visit.  Recommendations:  Based on my evaluation the patient does not appear to have an emergency medical condition.   Disposition: Supportive therapy provided about ongoing stressors. Recommend overnight obs and reasses in the morning.  Mliss Fritz, NP 11/07/2019, 2:09 AM

## 2019-11-07 NOTE — Plan of Care (Signed)
[facility] Observation Crisis Plan  Reason for Crisis Plan:  Crisis Stabilization   Plan of Care:  Referral for Inpatient Hospitalization  Family Support:      Current Living Environment:  Living Arrangements: Alone  Insurance:   Hospital Account    Name Acct ID Class Status Primary Coverage   Peter Wilson, Peter Wilson [id number] [place] - MEDICARE PART A AND B        Guarantor Account (for Hospital Account [id number])    Name Relation to Pt Service Area Active? Acct Type   Peter Wilson Self [facility] Yes Behavioral Health   Address Phone       [address], [place] [address] [phone])          Coverage Information (for Hospital Account [id number])    F/O Payor/Plan Precert #   MEDICARE/MEDICARE PART A AND B    Subscriber Subscriber #   Peter Wilson [id number]   Address Phone   [address] [place], [place] [address]       Legal Guardian:     Primary Care Provider:  Jodi Marble, MD  Current Outpatient Providers:  ACT  Psychiatrist:  Name of Psychiatrist: Neither [facility] or [facility] crisis line would claim pt. He is stuck inbetween & has neither  Counselor/Therapist:  Name of Therapist: Neither [facility] or [facility] crisis line would claim pt. He is stuck inbetween & has neither  Compliant with Medications:  Yes  Additional Information:   Peter Wilson 11/13/20205:10 AM

## 2019-11-07 NOTE — Discharge Instructions (Signed)
For your behavioral health needs, you are advised to continue treatment with [facility]: ° °     [facility] °     [address] °     [place], [place] [address] °     [phone] °

## 2019-11-07 NOTE — Progress Notes (Signed)
D: Pt A & O X 4. Denies SI, HI, AVH and pain at this time. Verbalized concerns about medication management, housing and disability check "I can't be alright and nobody wants me to stay at their place if I don't have my medications; I don't have nowhere to go, I need my check back". D/C as ordered. Instructed to bus stop. A: D/C instructions reviewed with pt including medication samples and instructions to follow up with [facility] (ACTT team); compliance encouraged. Writer spoke with [place] at [facility] who confirmed that pt's new intake assessment was done on 11/06/19. She stated it takes approximately 2 weeks for pt to be approved by [facility] to join their team, however, he was assisted by a staff to contact social security office on 11/06/19 in reference to his check.    All belongings from locker # given to pt at time of departure. Safety checks maintained without incident till time of d/c.  R: Pt receptive to care. Verbalized understanding related to d/c instructions. Signed belonging sheet in agreement with items received from locker. Ambulatory with a steady gait. Appears to be in no physical distress at time of departure.

## 2019-11-07 NOTE — Discharge Summary (Signed)
[facility] Psych Observation Discharge  11/07/2019 11:32 AM Peter Wilson  MRN:  [id number] Principal Problem: Schizoaffective disorder [facility]) Discharge Diagnoses: Principal Problem:   Schizoaffective disorder ([place]) Active Problems:   Schizo affective schizophrenia ([place])   Subjective: Peter Wilson, 43 y.o., male patient seen face to face by this provider, Dr. Dwyane Dee; and chart reviewed on 11/07/19.  On evaluation Peter Wilson reports that he came back to the hospital because he was having trouble getting his medication states that he is also having a problem with his check and that he is unable to get any medications filled.  Patient reports that he has spoken with [facility] which is the ACT team that would be picking him up since he is no longer with [facility].  Patient denies suicidal/self-harm/homicidal ideation, psychosis, and paranoia.  States that he lives with his mother but cannot go back until he can get his medication.  Informed patient that we would be able to give him a 5-day sample of his medications and that when he is ACT team came out to see him they would be bringing him his other medication that they are waiting on approval through [place]. During evaluation Peter Wilson is alert/oriented x 4; calm/cooperative; and mood is congruent with affect; but voicing some agitation related to feeling that he is getting the run around from Social Security related to not having his check since July 28, 2019.  Stating that is why he cannot purchase his medication.  He does not appear to be responding to internal/external stimuli or delusional thoughts.  Patient denies suicidal/self-harm/homicidal ideation, psychosis, and paranoia.  Patient answered question appropriately.   Also informed patient at the [facility] there was a Education officer, museum who could probably help him with the issues concerning his Social Security income. Social work spoke with [facility] and informed that patient was in the  hospital again they informed that they would be glad to see the patient on Tuesday and will be able to assist with his medications then but had to wait on the approval from [place].   Total Time spent with patient: 30 minutes  Past Psychiatric History: See above  Past Medical History:  Past Medical History:  Diagnosis Date  . Marijuana abuse   . Schizophrenia, paranoid ([place])   . Tardive dyskinesia   . Tobacco abuse     Past Surgical History:  Procedure Laterality Date  . NO PAST SURGERIES     Family History: History reviewed. No pertinent family history. Family Psychiatric  History: Unaware Social History:  Social History   Substance and Sexual Activity  Alcohol Use Yes  . Alcohol/week: 0.0 standard drinks   Comment: Unknown      Social History   Substance and Sexual Activity  Drug Use Yes  . Frequency: 7.0 times per week  . Types: Marijuana   Comment: Unknown     Social History   Socioeconomic History  . Marital status: Single    Spouse name: Not on file  . Number of children: Not on file  . Years of education: Not on file  . Highest education level: Not on file  Occupational History  . Occupation: Disability  Social Needs  . Financial resource strain: Not on file  . Food insecurity    Worry: Not on file    Inability: Not on file  . Transportation needs    Medical: Not on file    Non-medical: Not on file  Tobacco Use  . Smoking status: Current  Every Day Smoker    Packs/day: 2.00    Types: Cigarettes  . Smokeless tobacco: Never Used  . Tobacco comment: Unknown   Substance and Sexual Activity  . Alcohol use: Yes    Alcohol/week: 0.0 standard drinks    Comment: Unknown   . Drug use: Yes    Frequency: 7.0 times per week    Types: Marijuana    Comment: Unknown   . Sexual activity: Not on file  Lifestyle  . Physical activity    Days per week: Not on file    Minutes per session: Not on file  . Stress: Not on file  Relationships  . Social  Musicianconnections    Talks on phone: Not on file    Gets together: Not on file    Attends religious service: Not on file    Active member of club or organization: Not on file    Attends meetings of clubs or organizations: Not on file    Relationship status: Not on file  Other Topics Concern  . Not on file  Social History Narrative   [facility], was [place] a hotel in [place] by [facility] ACTT provider[facility] [place], [place].      Has this patient used any form of tobacco in the last 30 days? (Cigarettes, Smokeless Tobacco, Cigars, and/or Pipes) A prescription for an FDA-approved tobacco cessation medication was offered at discharge and the patient refused  Current Medications: Current Facility-Administered Medications  Medication Dose Route Frequency Provider Last Rate Last Dose  . acetaminophen (TYLENOL) tablet 650 mg  650 mg Oral Q6H PRN Anike, Adaku C, NP      . alum & mag hydroxide-simeth (MAALOX/MYLANTA) 200-200-20 MG/5ML suspension 30 mL  30 mL Oral Q4H PRN Anike, Adaku C, NP      . benztropine (COGENTIN) tablet 1 mg  1 mg Oral BID Rankin, Shuvon B, NP      . divalproex (DEPAKOTE) DR tablet 750 mg  750 mg Oral BID Rankin, Shuvon B, NP      . gabapentin (NEURONTIN) tablet 600 mg  600 mg Oral TID Rankin, Shuvon B, NP      . magnesium hydroxide (MILK OF MAGNESIA) suspension 30 mL  30 mL Oral Daily PRN Anike, Adaku C, NP      . risperiDONE (RISPERDAL) tablet 3 mg  3 mg Oral TID Rankin, Shuvon B, NP      . temazepam (RESTORIL) capsule 30 mg  30 mg Oral QHS Rankin, Shuvon B, NP       PTA Medications: Medications Prior to Admission  Medication Sig Dispense Refill Last Dose  . benztropine (COGENTIN) 1 MG tablet Take 1 tablet (1 mg total) by mouth 2 (two) times daily. 60 tablet 0   . chlorproMAZINE (THORAZINE) 200 MG tablet Take 200 mg by mouth 3 (three) times daily.     . divalproex (DEPAKOTE) 250 MG DR tablet Take 3 tablets (750 mg total) by  mouth 2 (two) times daily. 180 tablet 0   . divalproex (DEPAKOTE) 500 MG DR tablet Take 500 mg by mouth 2 (two) times daily.     Marland Kitchen. gabapentin (NEURONTIN) 300 MG capsule Take 2 capsules (600 mg total) by mouth 3 (three) times daily. 180 capsule 0   . ibuprofen (ADVIL) 800 MG tablet Take 1 tablet (800 mg total) by mouth 3 (three) times daily. 21 tablet 0   . risperiDONE (RISPERDAL) 3 MG tablet Take 1 tablet (3 mg total)  by mouth 3 (three) times daily. 90 tablet 0   . temazepam (RESTORIL) 30 MG capsule Take 1 capsule (30 mg total) by mouth at bedtime. 15 capsule 0   . traZODone (DESYREL) 50 MG tablet Take 50 mg by mouth at bedtime.       Musculoskeletal: Strength & Muscle Tone: within normal limits Gait & Station: normal Patient leans: N/A  Psychiatric Specialty Exam: Physical Exam  ROS  There were no vitals taken for this visit.There is no height or weight on file to calculate BMI.  General Appearance: Casual  Eye Contact:  Good  Speech:  Clear and Coherent and Normal Rate  Volume:  Normal  Mood:  Anxious and " I am fine just so I can get the run around."  Appropriate  Affect:  Appropriate and Congruent  Thought Process:  Coherent, Goal Directed and Descriptions of Associations: Intact  Orientation:  Full (Time, Place, and Person)  Thought Content:  WDL  Suicidal Thoughts:  No  Homicidal Thoughts:  No  Memory:  Immediate;   Good Recent;   Good  Judgement:  Intact  Insight:  Present  Psychomotor Activity:  Normal  Concentration:  Concentration: Good and Attention Span: Good  Recall:  Good  Fund of Knowledge:  Good  Language:  Good  Akathisia:  No  Handed:  Right  AIMS (if indicated):     Assets:  Communication Skills Desire for Improvement Financial Resources/Insurance Social Support  ADL's:  Intact  Cognition:  WNL  Sleep:        Demographic Factors:  Male  Loss Factors: NA  Historical Factors: Impulsivity  Risk Reduction Factors:   Religious beliefs about  death and Living with another person, especially a relative  Continued Clinical Symptoms:  Previous Psychiatric Diagnoses and Treatments  Cognitive Features That Contribute To Risk:  None    Suicide Risk:  Minimal: No identifiable suicidal ideation.  Patients presenting with no risk factors but with morbid ruminations; may be classified as minimal risk based on the severity of the depressive symptoms    Plan Of Care/Follow-up recommendations:  Activity:  As tolerated Diet:  Heart healthy Other:  Follow-up with [facility] ACT team  Disposition: No evidence of imminent risk to self or others at present.   Patient does not meet criteria for psychiatric inpatient admission. Supportive therapy provided about ongoing stressors. Discussed crisis plan, support from social network, calling 911, coming to the Emergency Department, and calling Suicide Hotline.   Discharge Instructions     For your behavioral health needs, you are advised to continue treatment with [facility]:       [facility]      [address]      [place], [place] [address]      [phone]    Assunta Found, NP 11/07/2019, 11:32 AM

## 2019-11-07 NOTE — BH Assessment (Addendum)
[facility] Assessment Progress Note  Per Shuvon Rankin, FNP, this pt does not require psychiatric hospitalization at this time.  Pt is to be discharged from the [facility] Observation Unit with recommendation to follow up with [facility], where he is currently under consideration for ACT Team services.  This has been included in pt's discharge instructions.  Pt's nurse, Louanne Skye, has been notified.  Jalene Mullet, [place] Triage Specialist [phone]
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
## 2019-11-07 NOTE — Progress Notes (Signed)
Patient ID: Peter Wilson, male   DOB: 07-05-1976, 43 y.o.   MRN: [id number] Pt A&O x 4, presents agitated, paranoid and anxious after using Ecstasy earlier tonight.  Pt requesting his meds stating he need his meds refilled.  Pt discharged earlier today from facility.  Pt SI, HI and AVH.  Skin search completed.  Meds given for anxiety.  Monitoring for safety.

## 2019-11-12 ENCOUNTER — Other Ambulatory Visit: Payer: Self-pay

## 2019-11-12 ENCOUNTER — Emergency Department (HOSPITAL_COMMUNITY)
Admission: EM | Admit: 2019-11-12 | Discharge: 2019-11-13 | Disposition: A | Discharge status: Home / Self Care | Payer: Medicare Other | Attending: Emergency Medicine | Admitting: Emergency Medicine

## 2019-11-12 ENCOUNTER — Encounter (HOSPITAL_COMMUNITY): Payer: Self-pay | Admitting: Emergency Medicine

## 2019-11-12 DIAGNOSIS — Z20828 Contact with and (suspected) exposure to other viral communicable diseases: Secondary | ICD-10-CM | POA: Insufficient documentation

## 2019-11-12 DIAGNOSIS — F17210 Nicotine dependence, cigarettes, uncomplicated: Secondary | ICD-10-CM | POA: Insufficient documentation

## 2019-11-12 DIAGNOSIS — Z79899 Other long term (current) drug therapy: Secondary | ICD-10-CM | POA: Diagnosis not present

## 2019-11-12 DIAGNOSIS — F1210 Cannabis abuse, uncomplicated: Secondary | ICD-10-CM | POA: Insufficient documentation

## 2019-11-12 DIAGNOSIS — J45909 Unspecified asthma, uncomplicated: Secondary | ICD-10-CM | POA: Diagnosis not present

## 2019-11-12 DIAGNOSIS — Z046 Encounter for general psychiatric examination, requested by authority: Secondary | ICD-10-CM | POA: Diagnosis present

## 2019-11-12 DIAGNOSIS — F200 Paranoid schizophrenia: Secondary | ICD-10-CM | POA: Diagnosis not present

## 2019-11-12 DIAGNOSIS — R45850 Homicidal ideations: Secondary | ICD-10-CM | POA: Diagnosis not present

## 2019-11-12 LAB — COMPREHENSIVE METABOLIC PANEL
ALT: 14 U/L (ref 0–44)
AST: 29 U/L (ref 15–41)
Albumin: 4.4 g/dL (ref 3.5–5.0)
Alkaline Phosphatase: 68 U/L (ref 38–126)
Anion gap: 11 (ref 5–15)
BUN: 14 mg/dL (ref 6–20)
CO2: 26 mmol/L (ref 22–32)
Calcium: 9.9 mg/dL (ref 8.9–10.3)
Chloride: 104 mmol/L (ref 98–111)
Creatinine, Ser: 0.89 mg/dL (ref 0.61–1.24)
GFR calc Af Amer: >60 mL/min (ref >60)
GFR calc non Af Amer: >60 mL/min (ref >60)
Glucose, Bld: 115 mg/dL — ABNORMAL HIGH (ref 70–99)
Potassium: 3.9 mmol/L (ref 3.5–5.1)
Sodium: 141 mmol/L (ref 135–145)
Total Bilirubin: 0.6 mg/dL (ref 0.3–1.2)
Total Protein: 8.4 g/dL — ABNORMAL HIGH (ref 6.5–8.1)

## 2019-11-12 LAB — CBC
HCT: 44.3 % (ref 39.0–52.0)
Hemoglobin: 13.8 g/dL (ref 13.0–17.0)
MCH: 29.4 pg (ref 26.0–34.0)
MCHC: 31.2 g/dL (ref 30.0–36.0)
MCV: 94.3 fL (ref 80.0–100.0)
Platelets: 349 10*3/uL (ref 150–400)
RBC: 4.7 MIL/uL (ref 4.22–5.81)
RDW: 13.5 % (ref 11.5–15.5)
WBC: 6.2 10*3/uL (ref 4.0–10.5)
nRBC: 0 % (ref 0.0–0.2)

## 2019-11-12 LAB — SALICYLATE LEVEL: Salicylate Lvl: <7 mg/dL (ref 2.8–30.0)

## 2019-11-12 LAB — ETHANOL: Alcohol, Ethyl (B): <10 mg/dL (ref <10)

## 2019-11-12 LAB — ACETAMINOPHEN LEVEL: Acetaminophen (Tylenol), Serum: <10 ug/mL — ABNORMAL LOW (ref 10–30)

## 2019-11-12 NOTE — ED Notes (Signed)
Belongings placed in locker #5 

## 2019-11-12 NOTE — ED Notes (Signed)
Pt changed into purple scrubs. Pt refusing to give up phone, states it is the only thing keeping him calm. Pt in line of sight

## 2019-11-12 NOTE — ED Notes (Signed)
Pt placed in room for TTS to be completed
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
## 2019-11-12 NOTE — ED Notes (Signed)
Pt still had cell phone and charger, pt was on phone talking about how he wanted to kill people and how others were trying to kill him, belongings taken from pt and placed in his bag.

## 2019-11-12 NOTE — ED Triage Notes (Signed)
Pt here threatening to kill someone. He is playing a voice recording of a lady and repeating that he wants to "kill that bitch." States he is hearing voices.

## 2019-11-13 ENCOUNTER — Other Ambulatory Visit: Payer: Self-pay

## 2019-11-13 LAB — SARS CORONAVIRUS 2 (TAT 6-24 HRS): SARS Coronavirus 2: NEGATIVE

## 2019-11-13 MED ORDER — DIVALPROEX SODIUM 250 MG PO DR TAB
750.0000 mg | DELAYED_RELEASE_TABLET | Freq: Two times a day (BID) | ORAL | Status: DC
  Administered 2019-11-13: 750 mg via ORAL
  Filled 2019-11-13: qty 3

## 2019-11-13 MED ORDER — TEMAZEPAM 15 MG PO CAPS: 30.0000 mg | ORAL_CAPSULE | Freq: Every day | ORAL | Status: DC

## 2019-11-13 MED ORDER — ACETAMINOPHEN 325 MG PO TABS
650.0000 mg | ORAL_TABLET | Freq: Once | ORAL | Status: AC
  Administered 2019-11-13: 650 mg via ORAL
  Filled 2019-11-13: qty 2

## 2019-11-13 MED ORDER — RISPERIDONE 3 MG PO TABS
3.0000 mg | ORAL_TABLET | Freq: Three times a day (TID) | ORAL | Status: DC
  Administered 2019-11-13: 3 mg via ORAL
  Filled 2019-11-13: qty 1

## 2019-11-13 MED ORDER — GABAPENTIN 300 MG PO CAPS
600.0000 mg | ORAL_CAPSULE | Freq: Three times a day (TID) | ORAL | Status: DC
  Administered 2019-11-13: 600 mg via ORAL
  Filled 2019-11-13: qty 2

## 2019-11-13 MED ORDER — BENZTROPINE MESYLATE 1 MG PO TABS
1.0000 mg | ORAL_TABLET | Freq: Two times a day (BID) | ORAL | Status: DC
  Administered 2019-11-13: 1 mg via ORAL
  Filled 2019-11-13: qty 1

## 2019-11-13 NOTE — ED Notes (Signed)
Pt c/o headache and pt requesting repeatedly to be given cell phone as he has his ACT Team member's phone number in it. Advised pt [facility] will contact [facility] for pt. Voiced understanding.

## 2019-11-13 NOTE — ED Notes (Signed)
Pt. Took a shower and brushed his teeth.

## 2019-11-13 NOTE — ED Notes (Signed)
Pt ate states came to ED d/t was homicidal toward bus driver and someone else. States he has issues w/controlling his anger and has no place to go. States slept in "[place]" d/t his mother "can't handle me right now" and states "neither can my cousins because I blank out sometimes". States has been off his meds - states last Lorayne Bender was early October 2020.
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
## 2019-11-13 NOTE — Progress Notes (Addendum)
CSW called [facility] to see if he has a Care Coodinater; he doesn't. Pt has Medicare and would not have a Care Coordinator or need approval from [place] for ACT services at [facility]. [facility] were contacted. Staff provided the number to the team lead who is working on getting approval for pt to receive ACT services: Salonda [phone]. CSW attempted to leave team lead a message but her "mailbox is full" and no message could be left. A text was sent to this number requesting a return phone call.   Pt has been psychiatrically cleared per Mordecai Maes, NP. Information on [facility] crisis services have been faxed to [facility] ED. Pt will be encouraged to go to [facility] to receive and crisis assessment to begin outpatient services with them, until he can begin receiving ACT services with [facility].    Audree Camel, [name], [place] Disposition [facility]/TTS [phone]

## 2019-11-13 NOTE — Discharge Instructions (Signed)
Follow up with your doctor
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
## 2019-11-13 NOTE — ED Notes (Signed)
Urine specimen cup given to pt d/t need for UDS.
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
## 2019-11-13 NOTE — ED Notes (Signed)
ALL belongings - 2 labeled belongings bags - returned to pt - including pt's cell phone and charger. Pt noted to be upset d/t states he is missing headphones. No headphones noted in belongings bags or documented on Inventory Sheet. Pt voiced understanding to follow up w/[facility] as directed.

## 2019-11-13 NOTE — ED Notes (Signed)
Patient was given A Snack and Drink. A Diet was ordered for Lunch.

## 2019-11-13 NOTE — ED Notes (Signed)
Pt.s lunch has arrived.  

## 2019-11-13 NOTE — ED Notes (Signed)
Pt. Given a decaf coffee, with packets of sugar, creamer and half&half.

## 2019-11-13 NOTE — ED Notes (Signed)
Pt. Currently talking to TTS

## 2019-11-13 NOTE — ED Notes (Signed)
Pt.s breakfast has arrived. 

## 2019-11-13 NOTE — ED Notes (Signed)
When went in room to retrieve TTS cart. Pt. Told this writer, Sharyn Lull NT, he wanted to talk for a bit. He began getting tears an told me "if these bitches... don't leave me alone, then I'm going to shoot them". I told him that that wasn't going to be necessary, that we would do everything to help him. Pt. Understood and calmed down from crying.

## 2019-11-13 NOTE — ED Notes (Signed)
Pt. Called 2 times for 5 minutes today. Pt. Was made aware, before both his phone calls that he could only make 2 phone calls a day. 5 minutes each. Pt. Voiced understanding.

## 2019-11-14 ENCOUNTER — Encounter (HOSPITAL_COMMUNITY): Payer: Self-pay | Admitting: Emergency Medicine

## 2019-11-14 ENCOUNTER — Emergency Department (HOSPITAL_COMMUNITY)
Admission: EM | Admit: 2019-11-14 | Discharge: 2019-11-15 | Disposition: A | Discharge status: Home / Self Care | Payer: Medicare Other | Attending: Family Medicine | Admitting: Family Medicine

## 2019-11-14 DIAGNOSIS — R45850 Homicidal ideations: Secondary | ICD-10-CM | POA: Insufficient documentation

## 2019-11-14 DIAGNOSIS — J45909 Unspecified asthma, uncomplicated: Secondary | ICD-10-CM | POA: Insufficient documentation

## 2019-11-14 DIAGNOSIS — F17210 Nicotine dependence, cigarettes, uncomplicated: Secondary | ICD-10-CM | POA: Insufficient documentation

## 2019-11-14 DIAGNOSIS — F259 Schizoaffective disorder, unspecified: Secondary | ICD-10-CM | POA: Diagnosis present

## 2019-11-14 DIAGNOSIS — Z046 Encounter for general psychiatric examination, requested by authority: Secondary | ICD-10-CM | POA: Diagnosis present

## 2019-11-14 DIAGNOSIS — F200 Paranoid schizophrenia: Secondary | ICD-10-CM | POA: Insufficient documentation

## 2019-11-14 DIAGNOSIS — R45851 Suicidal ideations: Secondary | ICD-10-CM | POA: Diagnosis not present

## 2019-11-14 MED ORDER — STERILE WATER FOR INJECTION IJ SOLN
INTRAMUSCULAR | Status: AC
  Administered 2019-11-15: 10 mL
  Filled 2019-11-14: qty 10

## 2019-11-14 MED ORDER — ZIPRASIDONE MESYLATE 20 MG IM SOLR
20.0000 mg | Freq: Once | INTRAMUSCULAR | Status: AC
  Administered 2019-11-15: 20 mg via INTRAMUSCULAR
  Filled 2019-11-14: qty 20

## 2019-11-14 MED ORDER — LORAZEPAM 2 MG/ML IJ SOLN
2.0000 mg | Freq: Once | INTRAMUSCULAR | Status: AC
  Administered 2019-11-15: 2 mg via INTRAMUSCULAR
  Filled 2019-11-14: qty 1

## 2019-11-14 MED ORDER — DIPHENHYDRAMINE HCL 50 MG/ML IJ SOLN
50.0000 mg | Freq: Once | INTRAMUSCULAR | Status: AC
  Administered 2019-11-15: 50 mg via INTRAMUSCULAR
  Filled 2019-11-14: qty 1

## 2019-11-14 NOTE — ED Triage Notes (Signed)
Sent over by [facility] and escorted by police. Patient is IVC'd. He became agitated and aggressive earlier, threatening to kill staff and other patients at [facility].

## 2019-11-15 ENCOUNTER — Other Ambulatory Visit: Payer: Self-pay

## 2019-11-15 LAB — CBC
HCT: 41.3 % (ref 39.0–52.0)
Hemoglobin: 12.6 g/dL — ABNORMAL LOW (ref 13.0–17.0)
MCH: 29.2 pg (ref 26.0–34.0)
MCHC: 30.5 g/dL (ref 30.0–36.0)
MCV: 95.8 fL (ref 80.0–100.0)
Platelets: 321 10*3/uL (ref 150–400)
RBC: 4.31 MIL/uL (ref 4.22–5.81)
RDW: 13.6 % (ref 11.5–15.5)
WBC: 6.2 10*3/uL (ref 4.0–10.5)
nRBC: 0 % (ref 0.0–0.2)

## 2019-11-15 LAB — COMPREHENSIVE METABOLIC PANEL
ALT: 14 U/L (ref 0–44)
AST: 30 U/L (ref 15–41)
Albumin: 4.1 g/dL (ref 3.5–5.0)
Alkaline Phosphatase: 73 U/L (ref 38–126)
Anion gap: 7 (ref 5–15)
BUN: 15 mg/dL (ref 6–20)
CO2: 29 mmol/L (ref 22–32)
Calcium: 9.2 mg/dL (ref 8.9–10.3)
Chloride: 102 mmol/L (ref 98–111)
Creatinine, Ser: 0.9 mg/dL (ref 0.61–1.24)
GFR calc Af Amer: >60 mL/min (ref >60)
GFR calc non Af Amer: >60 mL/min (ref >60)
Glucose, Bld: 99 mg/dL (ref 70–99)
Potassium: 4.2 mmol/L (ref 3.5–5.1)
Sodium: 138 mmol/L (ref 135–145)
Total Bilirubin: 0.2 mg/dL — ABNORMAL LOW (ref 0.3–1.2)
Total Protein: 8.1 g/dL (ref 6.5–8.1)

## 2019-11-15 LAB — ETHANOL: Alcohol, Ethyl (B): <10 mg/dL (ref <10)

## 2019-11-15 LAB — ACETAMINOPHEN LEVEL: Acetaminophen (Tylenol), Serum: <10 ug/mL — ABNORMAL LOW (ref 10–30)

## 2019-11-15 LAB — SALICYLATE LEVEL: Salicylate Lvl: <7 mg/dL (ref 2.8–30.0)
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
## 2019-11-15 NOTE — ED Notes (Signed)
Pt discharged home. Discharged instructions read to pt who verbalized understanding. All belongings returned to pt who signed for same. Denies SI/HI, is not delusional and not responding to internal stimuli. Escorted pt to the ED exit.    

## 2019-11-15 NOTE — BH Assessment (Signed)
Received TTS consult request. Per [facility] staff, Pt has been given medication and is unable to participate in assessment at this time. [facility] staff will contact TTS when Pt is alert.   Evelena Peat, [facility], [facility] Triage Specialist [phone]

## 2019-11-15 NOTE — ED Provider Notes (Signed)
Emergency Department Provider Note   I have reviewed the triage vital signs and the nursing notes.   HISTORY  Chief Complaint Psychiatric Evaluation   HPI Peter Wilson is a 43 y.o. male with PMH of Schizophrenia presents to the emergency department under IVC from [facility].  Patient apparently arrived there for his medication and was very agitated and aggressive with staff.  He apparently said that he was going to kill them and "go back to jail for murder." Police arrived and escorted the patient to the ED. Patient tells me "I just want to die." He is acutely agitated and that limits my history.   Level 5 caveat: Acute agitation  Past Medical History:  Diagnosis Date  . Marijuana abuse   . Schizophrenia, paranoid ([place])   . Tardive dyskinesia   . Tobacco abuse     Patient Active Problem List   Diagnosis Date Noted  . Schizo affective schizophrenia ([place]) 11/07/2019  . Schizoaffective disorder ([place]) 11/05/2019  . Substance induced mood disorder ([place]) 10/13/2019  . Aggressive behavior   . Involuntary commitment   . Tremor 04/24/2018  . Schizophrenia ([place]) 04/01/2018  . Noncompliance 04/10/2017  . Asthma 07/13/2016  . Schizoaffective disorder, bipolar type ([place]) 06/27/2016  . Antisocial traits 09/15/2015  . Tobacco use disorder 09/14/2015  . Cannabis use disorder, moderate, dependence ([place]) 09/14/2015    Past Surgical History:  Procedure Laterality Date  . NO PAST SURGERIES      Allergies Penicillins  History reviewed. No pertinent family history.  Social History Social History   Tobacco Use  . Smoking status: Current Every Day Smoker    Packs/day: 2.00    Types: Cigarettes  . Smokeless tobacco: Never Used  . Tobacco comment: Unknown   Substance Use Topics  . Alcohol use: Yes    Alcohol/week: 0.0 standard drinks    Comment: Unknown   . Drug use: Yes    Frequency: 7.0 times per week    Types: Marijuana    Comment: Unknown     Review of Systems   Level 5 caveat: Psych emergency/agitation.   ____________________________________________   PHYSICAL EXAM:  VITAL SIGNS: ED Triage Vitals  Enc Vitals Group     BP 11/14/19 2301 122/86     Pulse Rate 11/14/19 2301 72     Resp 11/14/19 2301 18     Temp 11/14/19 2301 98 F (36.7 C)     Temp Source 11/14/19 2301 Oral     SpO2 11/14/19 2301 100 %   Constitutional: Alert but tangential. He is raising his voice and very agitated. He is pacing.  Eyes: Conjunctivae are normal.  Head: Atraumatic. Nose: No congestion/rhinnorhea. Mouth/Throat: Mucous membranes are moist.   Neck: No stridor.  Cardiovascular: Good peripheral circulation.  Respiratory: Normal respiratory effort.   Gastrointestinal: No distention.  Musculoskeletal: No gross deformities of extremities. Neurologic:  Normal speech and language. Moving all extremities.  Psychiatric: Mood and affect are agitated. Patient not responding to internal stimulus.   ____________________________________________   LABS (all labs ordered are listed, but only abnormal results are displayed)  Labs Reviewed  COMPREHENSIVE METABOLIC PANEL - Abnormal; Notable for the following components:      Result Value   Total Bilirubin 0.2 (*)    All other components within normal limits  ACETAMINOPHEN LEVEL - Abnormal; Notable for the following components:   Acetaminophen (Tylenol), Serum <10 (*)    All other components within normal limits  CBC - Abnormal; Notable for the following components:  Hemoglobin 12.6 (*)    All other components within normal limits  ETHANOL  SALICYLATE LEVEL  RAPID URINE DRUG SCREEN, HOSP PERFORMED   ____________________________________________   PROCEDURES  Procedure(s) performed:   Procedures  CRITICAL CARE Performed by: Maia Plan Total critical care time: 35 minutes Critical care time was exclusive of separately billable procedures and treating other patients. Critical care was necessary to treat  or prevent imminent or life-threatening deterioration. Critical care was time spent personally by me on the following activities: development of treatment plan with patient and/or surrogate as well as nursing, discussions with consultants, evaluation of patient's response to treatment, examination of patient, obtaining history from patient or surrogate, ordering and performing treatments and interventions, ordering and review of laboratory studies, ordering and review of radiographic studies, pulse oximetry and re-evaluation of patient's condition.  Alona Bene, MD Emergency Medicine  ____________________________________________   INITIAL IMPRESSION / ASSESSMENT AND PLAN / ED COURSE  Pertinent labs & imaging results that were available during my care of the patient were reviewed by me and considered in my medical decision making (see chart for details).   Patient arrives to the emergency department escorted by police after threatening to kill staff and patients at [facility] when he showed up for his medication.  He is acutely agitated and expressing both suicidal and homicidal ideation to me.  I have reviewed the IVC paperwork taken out by [facility] staff and completed the first exam paperwork to uphold the IVC.  I am unable to verbally redirect the patient.  Staff also attempting to verbally redirect and de-escalate the situation but ultimately patient required chemical sedation.  Will monitor closely and reevaluate when more calm. Will review labs and reassess.   Labs reviewed. Patient medically clear. PLaced TTS consult. Hold home meds until patient awakens and med-rec can be completed.  ____________________________________________  FINAL CLINICAL IMPRESSION(S) / ED DIAGNOSES  Final diagnoses:  Suicidal ideation  Homicidal ideation     MEDICATIONS GIVEN DURING THIS VISIT:  Medications  ziprasidone (GEODON) injection 20 mg (20 mg Intramuscular Given 11/15/19 0013)  diphenhydrAMINE  (BENADRYL) injection 50 mg (50 mg Intramuscular Given 11/15/19 0013)  LORazepam (ATIVAN) injection 2 mg (2 mg Intramuscular Given 11/15/19 0014)  sterile water (preservative free) injection (10 mLs  Given 11/15/19 0014)     Note:  This document was prepared using Dragon voice recognition software and may include unintentional dictation errors.  Alona Bene, MD, [facility] Emergency Medicine    Long, Arlyss Repress, MD 11/15/19 [phone]
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
## 2019-11-15 NOTE — BH Assessment (Signed)
[place] Assessment Progress Note    Patient's ACT Team has been identified as [facility] and not [facility] as patient reported.  TTS attempted to call them and left a message for the on-call person, but TTS has not received a call back to see what their plan for this patient would be.  Letitia Libra, NP, indicated that patient could be discharged even if the ACT Team did not call back.  She did not feel like patient was a danger to himself or others.

## 2019-11-15 NOTE — BH Assessment (Addendum)
Tele Assessment Note   Patient Name: Peter Wilson MRN: [id number] Referring Physician: Nanda Quinton Location of Patient: [facility] Location of Provider: [place] Department  Peter Wilson is an 43 y.o. male who presented to [facility] on IVC initiated at [facility] because he threatened to harm staff.  Patient states that he has not had his shot of Invega in the past month and a half.  He states that [employer] lied to him and told him that they were not working with him anymore, but he states that he received a letter from [facility] stating that they were paying for these services with [facility].  He states that they also have his ATM card and he states that he has not had any money because of it.  He was asked if [facility] was his payee and he stated that they were not.  He states that he got really angry with them yesterday and states that he threatened to "fuck them up."  When asked what he meant by that, he stated, "fight them."  Patient is not homicidal and does not have any plan of how he would kill anyone.  He denies SI and states that he is not currently experiencing any hallucinations.  Patient states that he continues to smoke marijuana, but denies any other current drug use.  Patient states that he is currently sleeping and eating well.  When asked if he felt like he needed to be in a hospital, he stated, "what do you think?"  Patient states that he is currently living with his "homeboy."  However, patient has a chronic history of homelessness and was just assessed approximately a week ago and indicated that he was homeless.  Patient presented as alert and oriented, his mood was irritable and his affect was unremarkable.  He did not appear to be responding to any internal stimuli. His judgment, insight and impulse control are characteristically impaired at his baseline.  Patient's thoughts were organized and his memory intact.  His speech was coherent, but pressured.  His psycho-motor  activity was unremarkable.  His eye contact was good.  Diagnosis: F20.0 Schizophrenia  Past Medical History:  Past Medical History:  Diagnosis Date  . Marijuana abuse   . Schizophrenia, paranoid ([place])   . Tardive dyskinesia   . Tobacco abuse     Past Surgical History:  Procedure Laterality Date  . NO PAST SURGERIES      Family History: History reviewed. No pertinent family history.  Social History:  reports that he has been smoking cigarettes. He has been smoking about 2.00 packs per day. He has never used smokeless tobacco. He reports current alcohol use. He reports current drug use. Frequency: 7.00 times per week. Drug: Marijuana.  Additional Social History:  Alcohol / Drug Use Pain Medications: see MAR Prescriptions: see MAR Over the Counter: see MAR History of alcohol / drug use?: Yes Longest period of sobriety (when/how long): None Reported Negative Consequences of Use: Financial, Personal relationships Substance #2 Name of Substance 2: Marijuana 2 - Age of First Use: 43 years of age 8 - Amount (size/oz): Varies 2 - Frequency: Varies 2 - Duration: ongoing 2 - Last Use / Amount: Few days ago.  CIWA: CIWA-Ar BP: 116/80 Pulse Rate: 69 COWS:    Allergies:  Allergies  Allergen Reactions  . Penicillins Rash    Did it involve swelling of the face/tongue/throat, SOB, or low BP? U Did it involve sudden or severe rash/hives, skin peeling, or any reaction on  the inside of your mouth or nose? Y  Did you need to seek medical attention at a hospital or doctor's office? Y When did it last happen?childhood If all above answers are "NO", may proceed with cephalosporin use.     Home Medications: (Not in a hospital admission)   OB/GYN Status:  No LMP for male patient.  General Assessment Data Assessment unable to be completed: Yes Reason for not completing assessment: Pt somnolent and unable to participate Location of Assessment: [facility] TTS Assessment: In  system Is this a Tele or Face-to-Face Assessment?: Tele Assessment Is this an Initial Assessment or a Re-assessment for this encounter?: Initial Assessment Patient Accompanied by:: Other(police) Language Other than English: No Living Arrangements: Homeless/Shelter What gender do you identify as?: Male Marital status: Single Living Arrangements: Alone Can pt return to current living arrangement?: Yes Admission Status: Involuntary Petitioner: Other([facility]) Is patient capable of signing voluntary admission?: Yes Referral Source: [facility] type: Medicare     Crisis Care Plan Living Arrangements: Alone Legal Guardian: Other:(self) Name of Psychiatrist: [place] Name of Therapist: [facility]  Education Status Is patient currently in school?: No Highest grade of school patient has completed: 1 year of college Is the patient employed, unemployed or receiving disability?: Receiving disability income  Risk to self with the past 6 months Suicidal Ideation: No Has patient been a risk to self within the past 6 months prior to admission? : No Suicidal Intent: No Has patient had any suicidal intent within the past 6 months prior to admission? : No Is patient at risk for suicide?: No Suicidal Plan?: No Has patient had any suicidal plan within the past 6 months prior to admission? : No Specify Current Suicidal Plan: none Access to Means: No What has been your use of drugs/alcohol within the last 12 months?: abusing THC Previous Attempts/Gestures: No How many times?: 0 Other Self Harm Risks: homeless Triggers for Past Attempts: None known Intentional Self Injurious Behavior: None Comment - Self Injurious Behavior: (none) Family Suicide History: No Recent stressful life event(s): Conflict (Comment)(with [facility]) Persecutory voices/beliefs?: No Depression: No Substance abuse history and/or treatment for substance abuse?: No Suicide prevention information  given to non-admitted patients: Not applicable  Risk to Others within the past 6 months Homicidal Ideation: Yes-Currently Present Does patient have any lifetime risk of violence toward others beyond the six months prior to admission? : Yes (comment) Thoughts of Harm to Others: Yes-Currently Present Comment - Thoughts of Harm to Others: (states that he wants to "fuck-up" the people on the ACT Team) Current Homicidal Intent: No(just wants to fight them) Current Homicidal Plan: No Describe Current Homicidal Plan: (po plan) Access to Homicidal Means: No Describe Access to Homicidal Means: none Identified Victim: ACTT Team History of harm to others?: No Assessment of Violence: On admission Violent Behavior Description: mostly just threatens to hurt Does patient have access to weapons?: No Criminal Charges Pending?: No Does patient have a court date: No Is patient on probation?: No  Psychosis Hallucinations: None noted Delusions: None noted  Mental Status Report Appearance/Hygiene: Disheveled, Poor hygiene Eye Contact: Good Motor Activity: Freedom of movement Speech: Pressured Level of Consciousness: Alert Mood: Labile Affect: Appropriate to circumstance Anxiety Level: Minimal Thought Processes: Coherent, Relevant Judgement: Partial Orientation: Person, Place, Time, Situation Obsessive Compulsive Thoughts/Behaviors: None  Cognitive Functioning Concentration: Normal Memory: Recent Intact, Remote Intact Is patient IDD: No Insight: Poor Impulse Control: Poor Appetite: Good Have you had any weight changes? : No Change  Sleep: No Change Total Hours of Sleep: 8 Vegetative Symptoms: Decreased grooming  ADLScreening [facility] Assessment Services) Patient's cognitive ability adequate to safely complete daily activities?: Yes Patient able to express need for assistance with ADLs?: Yes Independently performs ADLs?: Yes (appropriate for developmental age)  Prior Inpatient  Therapy Prior Inpatient Therapy: Yes Prior Therapy Dates: 10/20, multiple hospitalizations Prior Therapy Facilty/Provider(s): [facility], [facility] Reason for Treatment: schizophrenia  Prior Outpatient Therapy Prior Outpatient Therapy: Yes Prior Therapy Dates: active Prior Therapy Facilty/Provider(s): [facility] Reason for Treatment: schizophrenia Does patient have an ACCT team?: No Does patient have Intensive In-House Services?  : No Does patient have [facility] services? : No Does patient have P4CC services?: No  ADL Screening (condition at time of admission) Patient's cognitive ability adequate to safely complete daily activities?: Yes Is the patient deaf or have difficulty hearing?: No Does the patient have difficulty seeing, even when wearing glasses/contacts?: No Does the patient have difficulty concentrating, remembering, or making decisions?: Yes Patient able to express need for assistance with ADLs?: Yes Does the patient have difficulty dressing or bathing?: No Independently performs ADLs?: Yes (appropriate for developmental age) Does the patient have difficulty walking or climbing stairs?: No Weakness of Legs: None Weakness of Arms/Hands: None  Home Assistive Devices/Equipment Home Assistive Devices/Equipment: None  Therapy Consults (therapy consults require a physician order) PT Evaluation Needed: No OT Evalulation Needed: No SLP Evaluation Needed: No Abuse/Neglect Assessment (Assessment to be complete while patient is alone) Abuse/Neglect Assessment Can Be Completed: Yes Physical Abuse: Denies Verbal Abuse: Yes, past (Comment) Sexual Abuse: Denies Exploitation of patient/patient's resources: Denies Self-Neglect: Denies Values / Beliefs Cultural Requests During Hospitalization: None Spiritual Requests During Hospitalization: None Consults Spiritual Care Consult Needed: No Social Work Consult Needed: No Regulatory affairs officer (For Healthcare) Does Patient Have a  Medical Advance Directive?: No Would patient like information on creating a medical advance directive?: No - Patient declined Nutrition Screen- [facility] Adult/[facility] Has the patient recently lost weight without trying?: No Has the patient been eating poorly because of a decreased appetite?: No Malnutrition Screening Tool Score: 0        Disposition: Per Letitia Libra, NP, patient does not meet inpatient admission criteria and can follow-up with his ACT Team Disposition Initial Assessment Completed for this Encounter: Yes  This service was provided via telemedicine using a 2-way, interactive audio and video technology.  Names of all persons participating in this telemedicine service and their role in this encounter. Name: Peter Wilson Role: patient  Name: Kasandra Knudsen Marguerita Stapp Role: TTS  Name:  Role:   Name:  Role:     Reatha Armour 11/15/2019 8:20 AM

## 2019-11-15 NOTE — ED Provider Notes (Signed)
  Physical Exam  BP 116/80 (BP Location: Left Arm)   Pulse 69   Temp 98.7 F (37.1 C) (Oral)   Resp 17   SpO2 98%   Physical Exam  ED Course/Procedures     Procedures  MDM  Patient seen by psychiatry and cleared for discharge.  I reversed the IVC under the recommendation.       Davonna Belling, MD 11/15/19 1213

## 2019-11-17 ENCOUNTER — Ambulatory Visit (HOSPITAL_COMMUNITY)
Admission: RE | Admit: 2019-11-17 | Discharge: 2019-11-17 | Disposition: A | Discharge status: Home / Self Care | Payer: Medicare Other | Attending: Psychiatry | Admitting: Psychiatry

## 2019-11-17 ENCOUNTER — Emergency Department (HOSPITAL_COMMUNITY)
Admission: EM | Admit: 2019-11-17 | Discharge: 2019-11-18 | Disposition: A | Discharge status: Home / Self Care | Payer: Medicare Other | Source: Home / Self Care

## 2019-11-17 ENCOUNTER — Encounter (HOSPITAL_COMMUNITY): Payer: Self-pay

## 2019-11-17 ENCOUNTER — Emergency Department (HOSPITAL_COMMUNITY)
Admission: EM | Admit: 2019-11-17 | Discharge: 2019-11-17 | Disposition: A | Discharge status: Home / Self Care | Payer: Medicare Other | Source: Home / Self Care

## 2019-11-17 ENCOUNTER — Other Ambulatory Visit: Payer: Self-pay

## 2019-11-17 DIAGNOSIS — F17210 Nicotine dependence, cigarettes, uncomplicated: Secondary | ICD-10-CM | POA: Diagnosis not present

## 2019-11-17 DIAGNOSIS — Z5321 Procedure and treatment not carried out due to patient leaving prior to being seen by health care provider: Secondary | ICD-10-CM | POA: Insufficient documentation

## 2019-11-17 DIAGNOSIS — F200 Paranoid schizophrenia: Secondary | ICD-10-CM | POA: Diagnosis not present

## 2019-11-17 DIAGNOSIS — Z880 Allergy status to penicillin: Secondary | ICD-10-CM | POA: Diagnosis not present

## 2019-11-17 NOTE — ED Notes (Signed)
Pt requesting "something for my head". Pt informed he needed to wait to see a physician to be prescribed anything for pain. Triage RN [facility] aware.

## 2019-11-17 NOTE — H&P (Signed)
Behavioral Health Medical Screening Exam  Peter Wilson is an 43 y.o. male.  Total Time spent with patient: 15 minutes  Psychiatric Specialty Exam: Physical Exam  Constitutional: He is oriented to person, place, and time. He appears well-developed and well-nourished. No distress.  HENT:  Head: Normocephalic and atraumatic.  Right Ear: External ear normal.  Left Ear: External ear normal.  Eyes: Pupils are equal, round, and reactive to light. Right eye exhibits no discharge. Left eye exhibits no discharge. No scleral icterus.  Respiratory: Effort normal. No respiratory distress.  Musculoskeletal: Normal range of motion.  Neurological: He is alert and oriented to person, place, and time.  Skin: He is not diaphoretic.  Psychiatric: His mood appears anxious. He is not withdrawn and not actively hallucinating. Thought content is not paranoid and not delusional. He expresses no homicidal and no suicidal ideation.    ROS  There were no vitals taken for this visit.There is no height or weight on file to calculate BMI.  General Appearance: Casual and Well Groomed  Eye Contact:  Good  Speech:  Clear and Coherent and Normal Rate  Volume:  Normal  Mood:  Anxious  Affect:  Blunt  Thought Process:  Coherent, Goal Directed, Linear and Descriptions of Associations: Intact  Orientation:  Full (Time, Place, and Person)  Thought Content:  Logical and Hallucinations: None  Suicidal Thoughts:  No  Homicidal Thoughts:  Denies current HI  Memory:  Immediate;   Good Recent;   Good  Judgement:  Fair  Insight:  Fair  Psychomotor Activity:  Normal  Concentration: Concentration: Good  Recall:  Good  Fund of Knowledge:Good  Language: Good  Akathisia:  Negative  Handed:  Right  AIMS (if indicated):     Assets:  Communication Skills Desire for Improvement Financial Resources/Insurance Housing Leisure Time Physical Health  Sleep:       Musculoskeletal: Strength & Muscle Tone: within normal  limits Gait & Station: normal Patient leans: N/A  There were no vitals taken for this visit.  Recommendations:  Based on my evaluation the patient does not appear to have an emergency medical condition.  Disposition: No evidence of imminent risk to self or others at present.   Patient does not meet criteria for psychiatric inpatient admission. Supportive therapy provided about ongoing stressors. Discussed crisis plan, support from social network, calling 911, coming to the Emergency Department, and calling Suicide Hotline.  Rozetta Nunnery, NP 11/17/2019, 10:43 PM
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
## 2019-11-19 ENCOUNTER — Emergency Department (HOSPITAL_COMMUNITY)
Admission: EM | Admit: 2019-11-19 | Discharge: 2019-11-20 | Disposition: A | Discharge status: Home / Self Care | Payer: Medicare Other | Attending: Emergency Medicine | Admitting: Emergency Medicine

## 2019-11-19 ENCOUNTER — Encounter (HOSPITAL_COMMUNITY): Payer: Self-pay | Admitting: Emergency Medicine

## 2019-11-19 ENCOUNTER — Other Ambulatory Visit: Payer: Self-pay

## 2019-11-19 DIAGNOSIS — Y999 Unspecified external cause status: Secondary | ICD-10-CM | POA: Diagnosis not present

## 2019-11-19 DIAGNOSIS — R519 Headache, unspecified: Secondary | ICD-10-CM | POA: Insufficient documentation

## 2019-11-19 DIAGNOSIS — Y939 Activity, unspecified: Secondary | ICD-10-CM | POA: Diagnosis not present

## 2019-11-19 DIAGNOSIS — N5082 Scrotal pain: Secondary | ICD-10-CM | POA: Diagnosis present

## 2019-11-19 DIAGNOSIS — F200 Paranoid schizophrenia: Secondary | ICD-10-CM | POA: Diagnosis not present

## 2019-11-19 DIAGNOSIS — Y929 Unspecified place or not applicable: Secondary | ICD-10-CM | POA: Diagnosis not present

## 2019-11-19 DIAGNOSIS — F17210 Nicotine dependence, cigarettes, uncomplicated: Secondary | ICD-10-CM | POA: Insufficient documentation
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
## 2019-11-20 ENCOUNTER — Emergency Department (HOSPITAL_COMMUNITY): Payer: Medicare Other

## 2019-11-20 ENCOUNTER — Encounter (HOSPITAL_COMMUNITY): Payer: Self-pay | Admitting: Obstetrics and Gynecology

## 2019-11-20 ENCOUNTER — Other Ambulatory Visit: Payer: Self-pay

## 2019-11-20 ENCOUNTER — Encounter: Payer: Self-pay | Admitting: Emergency Medicine

## 2019-11-20 ENCOUNTER — Emergency Department (HOSPITAL_COMMUNITY)
Admission: EM | Admit: 2019-11-20 | Discharge: 2019-11-20 | Disposition: A | Discharge status: Home / Self Care | Payer: Medicare Other | Source: Home / Self Care | Attending: Emergency Medicine | Admitting: Emergency Medicine

## 2019-11-20 ENCOUNTER — Emergency Department
Admission: EM | Admit: 2019-11-20 | Discharge: 2019-11-21 | Disposition: A | Discharge status: Home / Self Care | Payer: Medicare Other | Attending: Student | Admitting: Student

## 2019-11-20 DIAGNOSIS — F17210 Nicotine dependence, cigarettes, uncomplicated: Secondary | ICD-10-CM | POA: Insufficient documentation

## 2019-11-20 DIAGNOSIS — F259 Schizoaffective disorder, unspecified: Secondary | ICD-10-CM | POA: Diagnosis not present

## 2019-11-20 DIAGNOSIS — Z79899 Other long term (current) drug therapy: Secondary | ICD-10-CM | POA: Diagnosis not present

## 2019-11-20 DIAGNOSIS — Y999 Unspecified external cause status: Secondary | ICD-10-CM | POA: Insufficient documentation

## 2019-11-20 DIAGNOSIS — Z9119 Patient's noncompliance with other medical treatment and regimen: Secondary | ICD-10-CM

## 2019-11-20 DIAGNOSIS — F209 Schizophrenia, unspecified: Secondary | ICD-10-CM

## 2019-11-20 DIAGNOSIS — F1220 Cannabis dependence, uncomplicated: Secondary | ICD-10-CM | POA: Diagnosis present

## 2019-11-20 DIAGNOSIS — S3021XA Contusion of penis, initial encounter: Secondary | ICD-10-CM | POA: Diagnosis not present

## 2019-11-20 DIAGNOSIS — Y939 Activity, unspecified: Secondary | ICD-10-CM | POA: Diagnosis not present

## 2019-11-20 DIAGNOSIS — S3021XD Contusion of penis, subsequent encounter: Secondary | ICD-10-CM

## 2019-11-20 DIAGNOSIS — Y929 Unspecified place or not applicable: Secondary | ICD-10-CM | POA: Insufficient documentation

## 2019-11-20 DIAGNOSIS — F602 Antisocial personality disorder: Secondary | ICD-10-CM | POA: Diagnosis present

## 2019-11-20 DIAGNOSIS — J45909 Unspecified asthma, uncomplicated: Secondary | ICD-10-CM | POA: Insufficient documentation

## 2019-11-20 DIAGNOSIS — Z20828 Contact with and (suspected) exposure to other viral communicable diseases: Secondary | ICD-10-CM | POA: Insufficient documentation

## 2019-11-20 DIAGNOSIS — Z91199 Patient's noncompliance with other medical treatment and regimen due to unspecified reason: Secondary | ICD-10-CM

## 2019-11-20 DIAGNOSIS — F17200 Nicotine dependence, unspecified, uncomplicated: Secondary | ICD-10-CM | POA: Diagnosis present

## 2019-11-20 DIAGNOSIS — Y9389 Activity, other specified: Secondary | ICD-10-CM | POA: Insufficient documentation

## 2019-11-20 DIAGNOSIS — S3093XA Unspecified superficial injury of penis, initial encounter: Secondary | ICD-10-CM | POA: Diagnosis present

## 2019-11-20 DIAGNOSIS — R4689 Other symptoms and signs involving appearance and behavior: Secondary | ICD-10-CM | POA: Diagnosis present

## 2019-11-20 DIAGNOSIS — F250 Schizoaffective disorder, bipolar type: Secondary | ICD-10-CM | POA: Diagnosis not present

## 2019-11-20 DIAGNOSIS — R251 Tremor, unspecified: Secondary | ICD-10-CM | POA: Diagnosis present

## 2019-11-20 DIAGNOSIS — W278XXA Contact with other nonpowered hand tool, initial encounter: Secondary | ICD-10-CM | POA: Insufficient documentation

## 2019-11-20 DIAGNOSIS — Z046 Encounter for general psychiatric examination, requested by authority: Secondary | ICD-10-CM

## 2019-11-20 DIAGNOSIS — Y9289 Other specified places as the place of occurrence of the external cause: Secondary | ICD-10-CM | POA: Insufficient documentation

## 2019-11-20 DIAGNOSIS — F1994 Other psychoactive substance use, unspecified with psychoactive substance-induced mood disorder: Secondary | ICD-10-CM | POA: Diagnosis present

## 2019-11-20 LAB — CBC WITH DIFFERENTIAL/PLATELET
Abs Immature Granulocytes: 0.01 10*3/uL (ref 0.00–0.07)
Basophils Absolute: 0.1 10*3/uL (ref 0.0–0.1)
Basophils Relative: 1 %
Eosinophils Absolute: 0.2 10*3/uL (ref 0.0–0.5)
Eosinophils Relative: 3 %
HCT: 41.3 % (ref 39.0–52.0)
Hemoglobin: 12.9 g/dL — ABNORMAL LOW (ref 13.0–17.0)
Immature Granulocytes: 0 %
Lymphocytes Relative: 50 %
Lymphs Abs: 3.7 10*3/uL (ref 0.7–4.0)
MCH: 29.1 pg (ref 26.0–34.0)
MCHC: 31.2 g/dL (ref 30.0–36.0)
MCV: 93 fL (ref 80.0–100.0)
Monocytes Absolute: 0.5 10*3/uL (ref 0.1–1.0)
Monocytes Relative: 7 %
Neutro Abs: 2.9 10*3/uL (ref 1.7–7.7)
Neutrophils Relative %: 39 %
Platelets: 326 10*3/uL (ref 150–400)
RBC: 4.44 MIL/uL (ref 4.22–5.81)
RDW: 13.5 % (ref 11.5–15.5)
WBC: 7.5 10*3/uL (ref 4.0–10.5)
nRBC: 0 % (ref 0.0–0.2)

## 2019-11-20 LAB — URINALYSIS, ROUTINE W REFLEX MICROSCOPIC
Bacteria, UA: NONE SEEN
Bilirubin Urine: NEGATIVE
Bilirubin Urine: NEGATIVE
Glucose, UA: NEGATIVE mg/dL
Glucose, UA: NEGATIVE mg/dL
Hgb urine dipstick: NEGATIVE
Ketones, ur: NEGATIVE mg/dL
Ketones, ur: NEGATIVE mg/dL
Leukocytes,Ua: NEGATIVE
Leukocytes,Ua: NEGATIVE
Nitrite: NEGATIVE
Nitrite: NEGATIVE
Protein, ur: NEGATIVE mg/dL
Protein, ur: NEGATIVE mg/dL
Specific Gravity, Urine: 1.027 (ref 1.005–1.030)
Specific Gravity, Urine: 1.026 (ref 1.005–1.030)
pH: 5 (ref 5.0–8.0)
pH: 6 (ref 5.0–8.0)

## 2019-11-20 LAB — BASIC METABOLIC PANEL
Anion gap: 9 (ref 5–15)
BUN: 11 mg/dL (ref 6–20)
CO2: 27 mmol/L (ref 22–32)
Calcium: 9.4 mg/dL (ref 8.9–10.3)
Chloride: 103 mmol/L (ref 98–111)
Creatinine, Ser: 0.87 mg/dL (ref 0.61–1.24)
GFR calc Af Amer: >60 mL/min (ref >60)
GFR calc non Af Amer: >60 mL/min (ref >60)
Glucose, Bld: 93 mg/dL (ref 70–99)
Potassium: 4 mmol/L (ref 3.5–5.1)
Sodium: 139 mmol/L (ref 135–145)

## 2019-11-20 LAB — URINE DRUG SCREEN, QUALITATIVE (ARMC ONLY)
Amphetamines, Ur Screen: NOT DETECTED
Barbiturates, Ur Screen: NOT DETECTED
Benzodiazepine, Ur Scrn: NOT DETECTED
Cannabinoid 50 Ng, Ur ~~LOC~~: NOT DETECTED
Cocaine Metabolite,Ur ~~LOC~~: NOT DETECTED
MDMA (Ecstasy)Ur Screen: NOT DETECTED
Methadone Scn, Ur: NOT DETECTED
Opiate, Ur Screen: NOT DETECTED
Phencyclidine (PCP) Ur S: NOT DETECTED
Tricyclic, Ur Screen: NOT DETECTED

## 2019-11-20 MED ORDER — IBUPROFEN 800 MG PO TABS: 800.0000 mg | ORAL_TABLET | Freq: Three times a day (TID) | ORAL | 0 refills | Status: DC | PRN

## 2019-11-20 MED ORDER — DIVALPROEX SODIUM 250 MG PO DR TAB: 750.0000 mg | DELAYED_RELEASE_TABLET | Freq: Two times a day (BID) | ORAL | 0 refills | Status: DC

## 2019-11-20 MED ORDER — ACETAMINOPHEN 500 MG PO TABS: 1000.0000 mg | ORAL_TABLET | Freq: Once | ORAL | Status: DC

## 2019-11-20 MED ORDER — TEMAZEPAM 30 MG PO CAPS: 30.0000 mg | ORAL_CAPSULE | Freq: Every day | ORAL | 0 refills | Status: DC

## 2019-11-20 MED ORDER — FENTANYL CITRATE (PF) 100 MCG/2ML IJ SOLN
50.0000 ug | Freq: Once | INTRAMUSCULAR | Status: AC
  Administered 2019-11-20: 50 ug via INTRAVENOUS
  Filled 2019-11-20: qty 2

## 2019-11-20 MED ORDER — RISPERIDONE 3 MG PO TABS: 3.0000 mg | ORAL_TABLET | Freq: Three times a day (TID) | ORAL | 0 refills | Status: DC

## 2019-11-20 MED ORDER — TEMAZEPAM 7.5 MG PO CAPS: 30.0000 mg | ORAL_CAPSULE | Freq: Every day | ORAL | Status: DC

## 2019-11-20 MED ORDER — RISPERIDONE 3 MG PO TABS
3.0000 mg | ORAL_TABLET | Freq: Three times a day (TID) | ORAL | Status: DC
  Administered 2019-11-21: 3 mg via ORAL
  Filled 2019-11-20: qty 3

## 2019-11-20 MED ORDER — GABAPENTIN 300 MG PO CAPS
300.0000 mg | ORAL_CAPSULE | Freq: Once | ORAL | Status: AC
  Administered 2019-11-20: 300 mg via ORAL
  Filled 2019-11-20: qty 1

## 2019-11-20 MED ORDER — ACETAMINOPHEN 325 MG PO TABS
650.0000 mg | ORAL_TABLET | Freq: Once | ORAL | Status: AC
  Administered 2019-11-20: 650 mg via ORAL
  Filled 2019-11-20: qty 2

## 2019-11-20 MED ORDER — BENZTROPINE MESYLATE 1 MG PO TABS
1.0000 mg | ORAL_TABLET | Freq: Two times a day (BID) | ORAL | Status: DC
  Administered 2019-11-21: 1 mg via ORAL
  Filled 2019-11-20: qty 1

## 2019-11-20 MED ORDER — DIVALPROEX SODIUM 500 MG PO DR TAB
750.0000 mg | DELAYED_RELEASE_TABLET | Freq: Two times a day (BID) | ORAL | Status: DC
  Administered 2019-11-21: 10:00:00 750 mg via ORAL
  Filled 2019-11-20: qty 1

## 2019-11-20 MED ORDER — BENZTROPINE MESYLATE 1 MG PO TABS: 1.0000 mg | ORAL_TABLET | Freq: Two times a day (BID) | ORAL | 0 refills | Status: DC

## 2019-11-20 MED ORDER — GABAPENTIN 300 MG PO CAPS: 600.0000 mg | ORAL_CAPSULE | Freq: Three times a day (TID) | ORAL | 0 refills | Status: DC

## 2019-11-20 MED ORDER — RISPERIDONE 3 MG PO TABS
3.0000 mg | ORAL_TABLET | Freq: Once | ORAL | Status: AC
  Administered 2019-11-20: 3 mg via ORAL
  Filled 2019-11-20: qty 1

## 2019-11-20 MED ORDER — BENZTROPINE MESYLATE 1 MG/ML IJ SOLN
1.0000 mg | Freq: Once | INTRAMUSCULAR | Status: AC
  Administered 2019-11-20: 1 mg via INTRAMUSCULAR
  Filled 2019-11-20: qty 1

## 2019-11-20 MED ORDER — DIVALPROEX SODIUM 250 MG PO DR TAB
250.0000 mg | DELAYED_RELEASE_TABLET | Freq: Once | ORAL | Status: AC
  Administered 2019-11-20: 250 mg via ORAL
  Filled 2019-11-20: qty 1

## 2019-11-20 MED ORDER — IBUPROFEN 800 MG PO TABS
800.0000 mg | ORAL_TABLET | Freq: Three times a day (TID) | ORAL | Status: DC | PRN
  Administered 2019-11-21: 800 mg via ORAL
  Filled 2019-11-20: qty 1

## 2019-11-20 MED ORDER — GABAPENTIN 300 MG PO CAPS
600.0000 mg | ORAL_CAPSULE | Freq: Three times a day (TID) | ORAL | Status: DC
  Administered 2019-11-21: 600 mg via ORAL
  Filled 2019-11-20: qty 2

## 2019-11-20 NOTE — ED Notes (Signed)
This RN and Jenise Pa at bedside to assess pt's head and penis. Not currently bleeding. No major swelling noted. Pt attempting to provide urine sample now.

## 2019-11-20 NOTE — ED Notes (Signed)
Pt to switch to bed 20H once dressed out.
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
## 2019-11-20 NOTE — ED Notes (Signed)
Pt being transported to Ultrasound

## 2019-11-20 NOTE — ED Notes (Signed)
Charge RN notified pt requesting psych consult. Jenise PA aware. Will medically clear then switch rooms. Pt given ice pack and soda as requested; verbal okay from Felton PA.
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
## 2019-11-20 NOTE — ED Notes (Signed)
Pt seen by NP

## 2019-11-20 NOTE — ED Notes (Signed)
Meal tray provided with drink  

## 2019-11-20 NOTE — ED Notes (Signed)
ED Provider Steinl at bedside.

## 2019-11-20 NOTE — ED Notes (Signed)
Pt may eat and drink per EDP. Pt given crackers and soda. Tolerating well.
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
## 2019-11-20 NOTE — ED Notes (Addendum)
Pt's belongings include: jacket, credit card, 11 cigarettes, belt, jeans, shirt, sneakers, socks, underwear, court documents/paperwork, and a lighter. All placed into belongings bags and pt sticker applied. Pt leaving for [facility] now; accompanied by Gwenette Greet RN with pt's belongings bags.

## 2019-11-20 NOTE — Discharge Instructions (Signed)
It was our pleasure to provide your ER care today - we hope that you feel better.  Take acetaminophen or ibuprofen as need.   Follow up with urologist in the coming week if recurrent/persistent blood in urine.   Return to ER if worse, new symptoms, fevers, unable to void, or other concern.

## 2019-11-20 NOTE — ED Notes (Signed)
Two RNs at bedside while dressing out.

## 2019-11-20 NOTE — ED Notes (Signed)
Jenise, PA to bedside again.

## 2019-11-20 NOTE — ED Notes (Signed)
Patient verbalizes understanding of discharge instructions. Opportunity for questioning and answers were provided. Armband removed by staff, pt discharged from ED ambulatory.   
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
## 2019-11-21 DIAGNOSIS — F250 Schizoaffective disorder, bipolar type: Secondary | ICD-10-CM

## 2019-11-21 LAB — SARS CORONAVIRUS 2 (TAT 6-24 HRS): SARS Coronavirus 2: NEGATIVE

## 2019-11-21 MED ORDER — BENZTROPINE MESYLATE 1 MG PO TABS: 1.0000 mg | ORAL_TABLET | Freq: Two times a day (BID) | ORAL | 0 refills | Status: DC

## 2019-11-21 MED ORDER — TEMAZEPAM 30 MG PO CAPS: 30.0000 mg | ORAL_CAPSULE | Freq: Every day | ORAL | 0 refills | Status: DC

## 2019-11-21 MED ORDER — GABAPENTIN 300 MG PO CAPS: 600.0000 mg | ORAL_CAPSULE | Freq: Three times a day (TID) | ORAL | 0 refills | Status: DC

## 2019-11-21 MED ORDER — DIVALPROEX SODIUM 250 MG PO DR TAB: 750.0000 mg | DELAYED_RELEASE_TABLET | Freq: Two times a day (BID) | ORAL | 0 refills | Status: DC

## 2019-11-21 MED ORDER — RISPERIDONE 3 MG PO TABS: 3.0000 mg | ORAL_TABLET | Freq: Three times a day (TID) | ORAL | 0 refills | Status: DC

## 2019-11-21 NOTE — ED Notes (Signed)
Pt resting quietly in the hall, appears to be attempting to cont to sleep, no distress noted, cont to monitor

## 2019-11-21 NOTE — ED Notes (Signed)
Pt given ice water.

## 2019-11-21 NOTE — ED Notes (Signed)
Pt given bus pass. PT verbalizes understanding of dicharge instructions and RX given as well as RX to pick up at pharmacy. Pt in NAD, VS, ambulatory
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
## 2019-11-21 NOTE — Discharge Instructions (Signed)
Managing Schizoaffective Disorder °If you have been diagnosed with schizoaffective disorder (ScAD), you may be relieved to know why you have felt or behaved a certain way. You may also feel overwhelmed about the treatment ahead, how to get the support you need, and how to deal with the condition day-to-day. With care and support, you can learn to manage your symptoms and live with ScAD. °ScAD is a chronic, lifelong condition that may occur in cycles. Periods of severe symptoms may be followed by periods of less severe symptoms or improvement. There are steps you can take to help manage ScAD and make your life better. °How to manage lifestyle changes °Managing stress °Stress is your body's reaction to life changes and events, both good and bad. For people with ScAD, stress can cause more severe symptoms to start (can be a trigger), so it is important to learn ways to deal with stress. Your health care provider, therapist, or counselor may suggest techniques such as: °· Meditation, muscle relaxation, and breathing exercises. °· Music therapy. This can include creating music or listening to music. °· Life skills training. This training is focused on work, self-care, money, house management, and social skills. °Other things you can do to manage stress include: °· Keeping a stress diary. This can help you learn what causes your stress to start and how you can control your response to those triggers. °· Exercising. Even a short daily walk can help. °· Getting enough sleep. °· Making a schedule to manage your time. Knowing what you will do from day to day helps you avoid feeling overwhelmed by tasks and deadlines. °· Spending time on hobbies you enjoy that help you relax. ° °Medicines °Your health care provider is likely to prescribe various types of medicine depending on your symptoms. These may include one or more of the following types: °· Antipsychotics. °· Mood stabilizers. °· Antidepressants. °Make sure you: °· Talk  with your pharmacist or health care provider about all medicines that you take, the possible side effects, and which medicines are safe to take together. °· Make it your goal to take part in all treatment decisions (shared decision-making). Ask about possible side effects of medicines that your health care provider recommends, and tell him or her how you feel about having those side effects. It is best if shared decision-making with your health care provider is part of your total treatment plan. °Relationships °Having the support of your family and friends can play a major role in the success of your treatment. The following steps can help you maintain healthy relationships: °· Think about going to couples therapy, family therapy, or family education classes. °· Create a written plan for your treatment, and include close family members and friends in the process. °· Consider bringing your partner or another family member or friend to the appointments you have with your health care provider. °How to recognize changes in your condition °If you find that your condition is getting worse, talk to your health care provider right away. Watch for these signs: °· Your mood becomes extreme with either emotional highs or the intense lows of depression. °· Your speech becomes unclear. °· You are disorganized, show the wrong social behaviors, or withdraw from social activities. °· You have racing thoughts and have trouble thinking clearly or staying focused. °· You hear, see, taste, and believe things that others do not. °· You have poor personal hygiene, weight gain or weight loss, or changes in how you are sleeping or eating. °  Follow these instructions at home: °· Take over-the-counter and prescription medicines only as told by your health care provider. Do not start new medicines or stop taking medicines before you ask your health care provider if it is safe to make those changes. °· Avoid caffeine, alcohol, and drugs. They  can affect how your medicine works and can make your symptoms worse. °· Eat a healthy diet. °· Look for support groups in your area so you can meet other people with your condition. You can learn new methods of managing ScAD by listening to others. °· Keep all follow-up visits as told by your health care provider, therapist, or counselor. This is important. °Where to find support °Talking to others °· Reach out to trusted friends or family members, explain your condition, and let them know that you are working with a health care team. °· Consider giving educational materials to friends and family. °· If you are having trouble telling your friends and family about your condition, keep in mind that honest and open communication can make these conversations easier. °Finances °Be sure to check with your insurance carrier to find out what treatment options are covered by your plan. You may also be able to find financial assistance through not-for-profit organizations or with local government-based resources. °If you are taking medicines, you may be able to get the generic form, which may be less expensive than brand-name medicine. Some makers of prescription medicines also offer help to patients who cannot afford the medicines that they need. °Therapy and support groups °· Make sure you find a counselor or therapist who is familiar with ScAD. Meet with your counselor or therapist once a week or more often if needed. °· Find support programs for people with ScAD. °Where to find more information °· National Alliance on Mental Illness: www.nami.org °Contact a health care provider if: °· You are not able to take your medicines as prescribed. °· Your symptoms get worse. °Get help right away if: °· You have serious thoughts about hurting yourself or others. °If you ever feel like you may hurt yourself or others, or have thoughts about taking your own life, get help right away. You can go to your nearest emergency department or  call: °· Your local emergency services (911 in the [place] °· A suicide crisis helpline, such as the National Suicide Prevention Lifeline at 1-800-273-8255. This is open 24 hours a day. °Summary °· Schizoaffective disorder (ScAD) is a chronic, lifelong illness. It is best controlled with continuous treatment that includes medicine and therapy. °· Learning ways to manage stress may help your treatment to work better. °· Having the support of your family and friends can be a key to making your treatment a success. °· If you find that your condition is getting worse, talk to your health care provider right away. °This information is not intended to replace advice given to you by your health care provider. Make sure you discuss any questions you have with your health care provider. °Document Released: 04/12/2017 Document Revised: 04/04/2019 Document Reviewed: 04/12/2017 °Elsevier Patient Education © 2020 [employer] Inc. ° °

## 2019-11-21 NOTE — ED Notes (Signed)
VOL/Consult Completed/Moved to BHU-6

## 2019-11-21 NOTE — ED Notes (Signed)
Lunch tray provided at this time
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
## 2019-11-21 NOTE — ED Notes (Signed)
Pt ambulated to the restroom, NAD noted. Calm and cooperative.

## 2019-11-27 ENCOUNTER — Emergency Department (HOSPITAL_COMMUNITY)
Admission: EM | Admit: 2019-11-27 | Discharge: 2019-11-27 | Disposition: A | Discharge status: Home / Self Care | Payer: Medicare Other | Attending: Emergency Medicine | Admitting: Emergency Medicine

## 2019-11-27 ENCOUNTER — Other Ambulatory Visit: Payer: Self-pay

## 2019-11-27 ENCOUNTER — Encounter (HOSPITAL_COMMUNITY): Payer: Self-pay

## 2019-11-27 ENCOUNTER — Ambulatory Visit (HOSPITAL_COMMUNITY)
Admission: RE | Admit: 2019-11-27 | Discharge: 2019-11-27 | Disposition: A | Discharge status: Home / Self Care | Payer: Medicare Other | Source: Home / Self Care | Attending: Psychiatry | Admitting: Psychiatry

## 2019-11-27 ENCOUNTER — Encounter (HOSPITAL_COMMUNITY): Payer: Self-pay | Admitting: Emergency Medicine

## 2019-11-27 ENCOUNTER — Ambulatory Visit (HOSPITAL_COMMUNITY)
Admission: AD | Admit: 2019-11-27 | Discharge: 2019-11-27 | Disposition: A | Discharge status: Home / Self Care | Payer: Medicare Other | Source: Home / Self Care | Attending: Psychiatry | Admitting: Psychiatry

## 2019-11-27 ENCOUNTER — Emergency Department (HOSPITAL_COMMUNITY)
Admission: EM | Admit: 2019-11-27 | Discharge: 2019-11-27 | Disposition: A | Discharge status: Home / Self Care | Payer: Medicare Other | Source: Home / Self Care | Attending: Emergency Medicine | Admitting: Emergency Medicine

## 2019-11-27 DIAGNOSIS — Z79899 Other long term (current) drug therapy: Secondary | ICD-10-CM | POA: Insufficient documentation

## 2019-11-27 DIAGNOSIS — R4689 Other symptoms and signs involving appearance and behavior: Secondary | ICD-10-CM

## 2019-11-27 DIAGNOSIS — Z5321 Procedure and treatment not carried out due to patient leaving prior to being seen by health care provider: Secondary | ICD-10-CM | POA: Diagnosis not present

## 2019-11-27 DIAGNOSIS — R45850 Homicidal ideations: Secondary | ICD-10-CM | POA: Insufficient documentation

## 2019-11-27 DIAGNOSIS — J45909 Unspecified asthma, uncomplicated: Secondary | ICD-10-CM | POA: Insufficient documentation

## 2019-11-27 LAB — COMPREHENSIVE METABOLIC PANEL
ALT: 9 U/L (ref 0–44)
AST: 30 U/L (ref 15–41)
Albumin: 3.9 g/dL (ref 3.5–5.0)
Alkaline Phosphatase: 74 U/L (ref 38–126)
Anion gap: 10 (ref 5–15)
BUN: 8 mg/dL (ref 6–20)
CO2: 27 mmol/L (ref 22–32)
Calcium: 9.4 mg/dL (ref 8.9–10.3)
Chloride: 102 mmol/L (ref 98–111)
Creatinine, Ser: 0.99 mg/dL (ref 0.61–1.24)
GFR calc Af Amer: >60 mL/min (ref >60)
GFR calc non Af Amer: >60 mL/min (ref >60)
Glucose, Bld: 126 mg/dL — ABNORMAL HIGH (ref 70–99)
Potassium: 3.1 mmol/L — ABNORMAL LOW (ref 3.5–5.1)
Sodium: 139 mmol/L (ref 135–145)
Total Bilirubin: 0.3 mg/dL (ref 0.3–1.2)
Total Protein: 7.2 g/dL (ref 6.5–8.1)

## 2019-11-27 LAB — RAPID URINE DRUG SCREEN, HOSP PERFORMED
Amphetamines: NOT DETECTED
Barbiturates: NOT DETECTED
Benzodiazepines: NOT DETECTED
Cocaine: NOT DETECTED
Opiates: NOT DETECTED
Tetrahydrocannabinol: POSITIVE — AB

## 2019-11-27 LAB — CBC
HCT: 41.3 % (ref 39.0–52.0)
Hemoglobin: 12.9 g/dL — ABNORMAL LOW (ref 13.0–17.0)
MCH: 29.1 pg (ref 26.0–34.0)
MCHC: 31.2 g/dL (ref 30.0–36.0)
MCV: 93 fL (ref 80.0–100.0)
Platelets: 321 10*3/uL (ref 150–400)
RBC: 4.44 MIL/uL (ref 4.22–5.81)
RDW: 13.3 % (ref 11.5–15.5)
WBC: 7.7 10*3/uL (ref 4.0–10.5)
nRBC: 0 % (ref 0.0–0.2)

## 2019-11-27 LAB — SALICYLATE LEVEL: Salicylate Lvl: <7 mg/dL (ref 2.8–30.0)

## 2019-11-27 LAB — ETHANOL: Alcohol, Ethyl (B): <10 mg/dL (ref <10)

## 2019-11-27 LAB — ACETAMINOPHEN LEVEL: Acetaminophen (Tylenol), Serum: <10 ug/mL — ABNORMAL LOW (ref 10–30)
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
## 2019-11-27 NOTE — ED Notes (Signed)
Pt states he doesn't like how he was treated in triage. States he is paranoid and he is just going to call a cab and leave
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
## 2019-11-27 NOTE — ED Triage Notes (Addendum)
Patient reports homicidal ideation , he did not specify the person and his plan , flight of ideas / unable to focus at arrival , denies hallucinations . No suicidal ideation . Patient refused to change to paper scrubs .
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
## 2019-12-25 ENCOUNTER — Other Ambulatory Visit: Payer: Self-pay

## 2019-12-25 ENCOUNTER — Emergency Department
Admission: EM | Admit: 2019-12-25 | Discharge: 2019-12-25 | Disposition: A | Discharge status: Home / Self Care | Payer: Medicare Other | Attending: Emergency Medicine | Admitting: Emergency Medicine

## 2019-12-25 DIAGNOSIS — Z760 Encounter for issue of repeat prescription: Secondary | ICD-10-CM | POA: Insufficient documentation

## 2019-12-25 DIAGNOSIS — Z5321 Procedure and treatment not carried out due to patient leaving prior to being seen by health care provider: Secondary | ICD-10-CM | POA: Insufficient documentation

## 2019-12-25 NOTE — ED Triage Notes (Signed)
Pt states he needs his invega shot. States he was getting it through ACT team but some "higher ups got fired." pt states hasn't had his shot in 3 months. Pt A&O, ambulatory.

## 2019-12-25 NOTE — ED Notes (Signed)
Called with no answer 
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
## 2019-12-25 NOTE — ED Triage Notes (Signed)
FIRST NURSE NOTE-here for invega shot. No SI or other complaints.
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
## 2020-04-06 ENCOUNTER — Encounter: Payer: Self-pay | Admitting: Emergency Medicine

## 2020-04-06 ENCOUNTER — Emergency Department
Admission: EM | Admit: 2020-04-06 | Discharge: 2020-04-08 | Disposition: A | Discharge status: Home / Self Care | Payer: Medicare Other | Attending: Emergency Medicine | Admitting: Emergency Medicine

## 2020-04-06 ENCOUNTER — Other Ambulatory Visit: Payer: Self-pay

## 2020-04-06 DIAGNOSIS — F602 Antisocial personality disorder: Secondary | ICD-10-CM | POA: Diagnosis present

## 2020-04-06 DIAGNOSIS — F209 Schizophrenia, unspecified: Secondary | ICD-10-CM | POA: Diagnosis present

## 2020-04-06 DIAGNOSIS — R45850 Homicidal ideations: Secondary | ICD-10-CM | POA: Insufficient documentation

## 2020-04-06 DIAGNOSIS — F1220 Cannabis dependence, uncomplicated: Secondary | ICD-10-CM | POA: Diagnosis present

## 2020-04-06 DIAGNOSIS — J45909 Unspecified asthma, uncomplicated: Secondary | ICD-10-CM | POA: Insufficient documentation

## 2020-04-06 DIAGNOSIS — F419 Anxiety disorder, unspecified: Secondary | ICD-10-CM | POA: Diagnosis not present

## 2020-04-06 DIAGNOSIS — F329 Major depressive disorder, single episode, unspecified: Secondary | ICD-10-CM | POA: Diagnosis present

## 2020-04-06 DIAGNOSIS — F17210 Nicotine dependence, cigarettes, uncomplicated: Secondary | ICD-10-CM | POA: Diagnosis not present

## 2020-04-06 DIAGNOSIS — R4689 Other symptoms and signs involving appearance and behavior: Secondary | ICD-10-CM | POA: Diagnosis present

## 2020-04-06 DIAGNOSIS — Z9119 Patient's noncompliance with other medical treatment and regimen: Secondary | ICD-10-CM

## 2020-04-06 DIAGNOSIS — F1994 Other psychoactive substance use, unspecified with psychoactive substance-induced mood disorder: Secondary | ICD-10-CM | POA: Diagnosis present

## 2020-04-06 DIAGNOSIS — Z046 Encounter for general psychiatric examination, requested by authority: Secondary | ICD-10-CM | POA: Diagnosis not present

## 2020-04-06 DIAGNOSIS — F250 Schizoaffective disorder, bipolar type: Secondary | ICD-10-CM | POA: Diagnosis present

## 2020-04-06 DIAGNOSIS — F17200 Nicotine dependence, unspecified, uncomplicated: Secondary | ICD-10-CM | POA: Diagnosis present

## 2020-04-06 DIAGNOSIS — Z91199 Patient's noncompliance with other medical treatment and regimen due to unspecified reason: Secondary | ICD-10-CM

## 2020-04-06 DIAGNOSIS — R251 Tremor, unspecified: Secondary | ICD-10-CM | POA: Diagnosis present

## 2020-04-06 DIAGNOSIS — Z9114 Patient's other noncompliance with medication regimen: Secondary | ICD-10-CM | POA: Diagnosis not present

## 2020-04-06 DIAGNOSIS — Z79899 Other long term (current) drug therapy: Secondary | ICD-10-CM | POA: Diagnosis not present

## 2020-04-06 LAB — COMPREHENSIVE METABOLIC PANEL
ALT: 10 U/L (ref 0–44)
AST: 34 U/L (ref 15–41)
Albumin: 4.5 g/dL (ref 3.5–5.0)
Alkaline Phosphatase: 61 U/L (ref 38–126)
Anion gap: 11 (ref 5–15)
BUN: 9 mg/dL (ref 6–20)
CO2: 25 mmol/L (ref 22–32)
Calcium: 9.2 mg/dL (ref 8.9–10.3)
Chloride: 103 mmol/L (ref 98–111)
Creatinine, Ser: 0.93 mg/dL (ref 0.61–1.24)
GFR calc Af Amer: >60 mL/min (ref >60)
GFR calc non Af Amer: >60 mL/min (ref >60)
Glucose, Bld: 124 mg/dL — ABNORMAL HIGH (ref 70–99)
Potassium: 3.6 mmol/L (ref 3.5–5.1)
Sodium: 139 mmol/L (ref 135–145)
Total Bilirubin: 0.6 mg/dL (ref 0.3–1.2)
Total Protein: 8.1 g/dL (ref 6.5–8.1)

## 2020-04-06 LAB — CBC
HCT: 37.7 % — ABNORMAL LOW (ref 39.0–52.0)
Hemoglobin: 11.8 g/dL — ABNORMAL LOW (ref 13.0–17.0)
MCH: 28.6 pg (ref 26.0–34.0)
MCHC: 31.3 g/dL (ref 30.0–36.0)
MCV: 91.5 fL (ref 80.0–100.0)
Platelets: 281 10*3/uL (ref 150–400)
RBC: 4.12 MIL/uL — ABNORMAL LOW (ref 4.22–5.81)
RDW: 13.5 % (ref 11.5–15.5)
WBC: 6 10*3/uL (ref 4.0–10.5)
nRBC: 0 % (ref 0.0–0.2)

## 2020-04-06 LAB — SALICYLATE LEVEL: Salicylate Lvl: <7 mg/dL — ABNORMAL LOW (ref 7.0–30.0)

## 2020-04-06 LAB — ACETAMINOPHEN LEVEL: Acetaminophen (Tylenol), Serum: <10 ug/mL — ABNORMAL LOW (ref 10–30)

## 2020-04-06 LAB — ETHANOL: Alcohol, Ethyl (B): <10 mg/dL (ref <10)

## 2020-04-06 MED ORDER — GABAPENTIN 600 MG PO TABS
600.0000 mg | ORAL_TABLET | Freq: Three times a day (TID) | ORAL | Status: DC
  Administered 2020-04-06 – 2020-04-08 (×5): 600 mg via ORAL
  Filled 2020-04-06 (×5): qty 1

## 2020-04-06 MED ORDER — BENZTROPINE MESYLATE 1 MG PO TABS
1.0000 mg | ORAL_TABLET | Freq: Two times a day (BID) | ORAL | Status: DC
  Administered 2020-04-06 – 2020-04-08 (×4): 1 mg via ORAL
  Filled 2020-04-06 (×4): qty 1

## 2020-04-06 MED ORDER — MIRTAZAPINE 15 MG PO TABS
15.0000 mg | ORAL_TABLET | Freq: Every day | ORAL | Status: DC
  Administered 2020-04-06 – 2020-04-07 (×2): 15 mg via ORAL
  Filled 2020-04-06 (×2): qty 1

## 2020-04-06 MED ORDER — DIVALPROEX SODIUM 500 MG PO DR TAB
750.0000 mg | DELAYED_RELEASE_TABLET | Freq: Two times a day (BID) | ORAL | Status: DC
  Administered 2020-04-06 – 2020-04-08 (×4): 750 mg via ORAL
  Filled 2020-04-06 (×4): qty 1

## 2020-04-06 MED ORDER — BUSPIRONE HCL 5 MG PO TABS
10.0000 mg | ORAL_TABLET | Freq: Two times a day (BID) | ORAL | Status: DC
  Administered 2020-04-06 – 2020-04-08 (×4): 10 mg via ORAL
  Filled 2020-04-06: qty 2
  Filled 2020-04-06 (×3): qty 1

## 2020-04-06 MED ORDER — RISPERIDONE 1 MG PO TABS
3.0000 mg | ORAL_TABLET | Freq: Three times a day (TID) | ORAL | Status: DC
  Administered 2020-04-06 – 2020-04-08 (×5): 3 mg via ORAL
  Filled 2020-04-06: qty 3
  Filled 2020-04-06 (×4): qty 1

## 2020-04-06 MED ORDER — QUETIAPINE FUMARATE 25 MG PO TABS
100.0000 mg | ORAL_TABLET | Freq: Every day | ORAL | Status: DC
  Administered 2020-04-06 – 2020-04-07 (×2): 100 mg via ORAL
  Filled 2020-04-06 (×2): qty 4

## 2020-04-06 MED ORDER — TEMAZEPAM 15 MG PO CAPS: 30.0000 mg | ORAL_CAPSULE | Freq: Every evening | ORAL | Status: DC | PRN

## 2020-04-06 MED ORDER — CLONAZEPAM 0.5 MG PO TABS
0.5000 mg | ORAL_TABLET | Freq: Two times a day (BID) | ORAL | Status: DC
  Administered 2020-04-06 – 2020-04-08 (×4): 0.5 mg via ORAL
  Filled 2020-04-06 (×4): qty 1

## 2020-04-06 NOTE — ED Notes (Signed)
Pt able to be dressed out into hospital appropriate scrubs by this RN and Kadijah, EDT back in room.  Pt belongings placed into 2 bags that included: 1 black fanny pack, cell phone cords, 1 pair blue jeans, pair shoes, 1 pair socks, 1 black shirt, 1 black jacket, 1 pair gray underwear, 1 pack cigarettes, 1 lighter, 1 $20 bill and 2 $1 bills counted by pt, this RN and Kadijah, 1 cell phone, 1 black headphones, 1 black cell phone cord and charging block.

## 2020-04-06 NOTE — ED Notes (Addendum)
Report to include Situation, Background, Assessment, and Recommendations received from RN Viviann Spare. Patient alert and oriented, warm and dry, in no acute distress. Patient reports HI towards his nephew and others that may pose as a threat, AVH of seeing his deceased brother and hearing other voices. Pt denies SI at this time. Pt st that before he gets to the point where he feels like he needs to kill himself to stop all the paranoia he comes here to be evaluated.   Pt very protective/ defiant and paranoid at this time and refusing to change in hospital attire. Pt requesting to at least keep his phone and headphones to help keep him calm and quiet the voices. ED charge, this RN and acting EDP Siadecki in agreement of this and have allowed pt to keep his phone/ headphones at this time. Pt made aware that we are making an exception and that these items will be removed from his possession at shift change. Pt agreed and has been dressed out.    Patient made aware of Q15 minute rounds and Psychologist, counselling presence for their safety. Patient instructed to come to me with needs or concerns.

## 2020-04-06 NOTE — ED Provider Notes (Signed)
[facility] Emergency Department Provider Note ____________________________________________   First MD Initiated Contact with Patient 04/06/20 2151     (approximate)  I have reviewed the triage vital signs and the nursing notes.   HISTORY  Chief Complaint Psychiatric Evaluation    HPI Peter Wilson is a 44 y.o. male with PMH as noted below including schizophrenia who presents with a complaint that his mind is not right.  The patient states that he has not been on his medications for a few months.  He reports feeling depressed, anxious, and reports homicidal ideation towards a nephew.  He also described paranoid and delusional thoughts about deceased family members.  He denies acute medical complaints.  Past Medical History:  Diagnosis Date  . Marijuana abuse   . Schizophrenia, paranoid (HCC)   . Tardive dyskinesia   . Tobacco abuse     Patient Active Problem List   Diagnosis Date Noted  . Substance induced mood disorder (HCC) 10/13/2019  . Aggressive behavior   . Tremor 04/24/2018  . Schizophrenia (HCC) 04/01/2018  . Noncompliance 04/10/2017  . Asthma 07/13/2016  . Schizoaffective disorder, bipolar type (HCC) 06/27/2016  . Antisocial traits 09/15/2015  . Tobacco use disorder 09/14/2015  . Cannabis use disorder, moderate, dependence (HCC) 09/14/2015    Past Surgical History:  Procedure Laterality Date  . NO PAST SURGERIES      Prior to Admission medications   Medication Sig Start Date End Date Taking? Authorizing Provider  benztropine (COGENTIN) 1 MG tablet Take 1 tablet (1 mg total) by mouth 2 (two) times daily. 11/21/19 01/20/20  Charm Rings, NP  busPIRone (BUSPAR) 10 MG tablet Take 10 mg by mouth 2 (two) times daily. 03/11/20   [provider]  clonazePAM (KLONOPIN) 0.5 MG tablet Take 0.5 mg by mouth 2 (two) times daily as needed. 04/05/20   [provider]  divalproex (DEPAKOTE) 250 MG DR tablet Take 3 tablets (750 mg  total) by mouth 2 (two) times daily. 11/21/19   Charm Rings, NP  gabapentin (NEURONTIN) 300 MG capsule Take 2 capsules (600 mg total) by mouth 3 (three) times daily. 11/21/19   Charm Rings, NP  haloperidol decanoate (HALDOL DECANOATE) 100 MG/ML injection  03/29/20   [provider]  ibuprofen (ADVIL) 800 MG tablet Take 1 tablet (800 mg total) by mouth every 8 (eight) hours as needed. 11/20/19   Elson Areas, PA-C  mirtazapine (REMERON) 15 MG tablet Take 15 mg by mouth at bedtime. 03/23/20   [provider]  QUEtiapine (SEROQUEL) 100 MG tablet Take 100 mg by mouth at bedtime. 03/23/20   [provider]  risperiDONE (RISPERDAL) 3 MG tablet Take 1 tablet (3 mg total) by mouth 3 (three) times daily. 11/21/19   Charm Rings, NP  temazepam (RESTORIL) 30 MG capsule Take 1 capsule (30 mg total) by mouth at bedtime. 11/21/19   Charm Rings, NP    Allergies Penicillins  History reviewed. No pertinent family history.  Social History Social History   Tobacco Use  . Smoking status: Current Every Day Smoker    Packs/day: 2.00    Types: Cigarettes  . Smokeless tobacco: Never Used  . Tobacco comment: Unknown   Substance Use Topics  . Alcohol use: Yes    Alcohol/week: 0.0 standard drinks    Comment: Unknown   . Drug use: Yes    Frequency: 7.0 times per week    Types: Marijuana    Comment: Unknown  Review of Systems  Constitutional: No fever/chills Eyes: No visual changes. ENT: No sore throat. Cardiovascular: Denies chest pain. Respiratory: Denies shortness of breath. Gastrointestinal: No vomiting or diarrhea.  Genitourinary: Negative for dysuria.  Musculoskeletal: Negative for back pain. Skin: Negative for rash. Neurological: Negative for headache.   ____________________________________________   PHYSICAL EXAM:  VITAL SIGNS: ED Triage Vitals [04/06/20 2140]  Enc Vitals Group     BP (!) 128/95     Pulse Rate 95     Resp 18     Temp  98.6 F (37 C)     Temp Source Oral     SpO2 95 %     Weight 170 lb (77.1 kg)     Height 6' (1.829 m)     Head Circumference      Peak Flow      Pain Score 0     Pain Loc      Pain Edu?      Excl. in [place]?     Constitutional: Alert and oriented. Well appearing and in no acute distress. Eyes: Conjunctivae are normal.  Head: Atraumatic. Nose: No congestion/rhinnorhea. Mouth/Throat: Mucous membranes are moist.   Neck: Normal range of motion.  Cardiovascular: Good peripheral circulation. Respiratory: Normal respiratory effort.  No retractions. Gastrointestinal:  No distention.  Musculoskeletal:  Extremities warm and well perfused.  Neurologic:  Normal speech and language. No gross focal neurologic deficits are appreciated.  Skin:  Skin is warm and dry. No rash noted. Psychiatric: Anxious appearing but cooperative, somewhat tangential and disorganized thought.  ____________________________________________   LABS (all labs ordered are listed, but only abnormal results are displayed)  Labs Reviewed  COMPREHENSIVE METABOLIC PANEL - Abnormal; Notable for the following components:      Result Value   Glucose, Bld 124 (*)    All other components within normal limits  SALICYLATE LEVEL - Abnormal; Notable for the following components:   Salicylate Lvl <6.0 (*)    All other components within normal limits  ACETAMINOPHEN LEVEL - Abnormal; Notable for the following components:   Acetaminophen (Tylenol), Serum <10 (*)    All other components within normal limits  CBC - Abnormal; Notable for the following components:   RBC 4.12 (*)    Hemoglobin 11.8 (*)    HCT 37.7 (*)    All other components within normal limits  ETHANOL  URINE DRUG SCREEN, QUALITATIVE ([facility] ONLY)   ____________________________________________  EKG   ____________________________________________  RADIOLOGY    ____________________________________________   PROCEDURES  Procedure(s) performed:  No  Procedures  Critical Care performed: No ____________________________________________   INITIAL IMPRESSION / ASSESSMENT AND PLAN / ED COURSE  Pertinent labs & imaging results that were available during my care of the patient were reviewed by me and considered in my medical decision making (see chart for details).  44 year old male with history of schizophrenia presents voluntarily due to multiple psychiatric complaints.  He states he has not been compliant with his medications.  He endorses HI towards a family member, and also demonstrated some paranoid delusions when describing his symptoms to another staff member.  He has no medical complaints.  On exam, his vital signs are normal and the physical exam is otherwise unremarkable.  Lab work-up obtained for medical clearance is within normal limits.  I have ordered psychiatry and TTS consultations.  I have placed the patient under involuntary commitment due my concern for acute danger to self and others.  Disposition will be based on psychiatry team recommendations.  _________________________  The patient has been placed in psychiatric observation due to the need to provide a safe environment for the patient while obtaining psychiatric consultation and evaluation, as well as ongoing medical and medication management to treat the patient's condition.  The patient has been placed under full IVC at this time.  ____________________________________________   FINAL CLINICAL IMPRESSION(S) / ED DIAGNOSES  Final diagnoses:  Schizophrenia, unspecified type (HCC)      NEW MEDICATIONS STARTED DURING THIS VISIT:  New Prescriptions   No medications on file     Note:  This document was prepared using Dragon voice recognition software and may include unintentional dictation errors.    Dionne Bucy, MD 04/06/20 2326

## 2020-04-06 NOTE — ED Triage Notes (Signed)
Pt to ED via EMS picked up from gas station, states took medications today but hasn't been taking them like normal, denies SI, states HI with people he lives with.  Denies alcohol or other drugs besides marijuana.  Pt all over the place in triage, not wanting to dress out.

## 2020-04-07 NOTE — ED Notes (Signed)
Hourly rounding reveals patient sleeping in room. No complaints, stable, in no acute distress. Q15 minute rounds and monitoring via Security Cameras to continue. 

## 2020-04-07 NOTE — ED Notes (Signed)
Hourly rounding reveals patient asleep in room. No complaints, stable, in no acute distress. Q15 minute rounds and monitoring via Security Cameras to continue. 

## 2020-04-07 NOTE — ED Notes (Signed)
Patient has been appropriate and cooperative. Denies SI/HI/AVH

## 2020-04-07 NOTE — ED Provider Notes (Signed)
Emergency Medicine Observation Re-evaluation Note  Peter Wilson is a 44 y.o. male, seen on rounds today.  Pt initially presented to the ED for complaints of Psychiatric Evaluation Currently, the patient is in no acute distress  Physical Exam  BP (!) 128/95 (BP Location: Left Arm)   Pulse 95   Temp 98.6 F (37 C) (Oral)   Resp 18   Ht 6' (1.829 m)   Wt 77.1 kg   SpO2 95%   BMI 23.06 kg/m  Physical Exam  ED Course / MDM  EKG:    I have reviewed the labs performed to date as well as medications administered while in observation.  Plan  Current plan is for psych eval Patient is under full IVC at this time.   Concha Se, MD 04/07/20 0730

## 2020-04-07 NOTE — ED Notes (Signed)
Pt requested and was given a ginger ale, pt has no further needs at this time but should any needs arise pt has been advised to let staff know

## 2020-04-07 NOTE — ED Notes (Signed)
Pt. To BHU from ED ambulatory without difficulty, to room  BHU4. Report from Gaffer. Pt. Is alert and oriented, warm and dry in no distress. Pt. Denies SI, HI, and AVH. Pt. Calm and cooperative. Pt. Made aware of security cameras and Q15 minute rounds. Pt. Encouraged to let Nursing staff know of any concerns or needs.

## 2020-04-07 NOTE — ED Notes (Signed)
Unable to assess. Patient cannot participate with the assessment at this time

## 2020-04-07 NOTE — ED Notes (Signed)
Report to include Situation, Background, Assessment, and Recommendations received from Jadeka RN. Patient alert, warm and dry, in no acute distress. Patient denies SI, HI, AVH and pain. Patient made aware of Q15 minute rounds and security cameras for their safety. Patient instructed to come to me with needs or concerns. 

## 2020-04-07 NOTE — ED Notes (Signed)
Hourly rounding reveals patient awake in room. No complaints, stable, in no acute distress. Q15 minute rounds and monitoring via Security Cameras to continue. 

## 2020-04-07 NOTE — BH Assessment (Signed)
Assessment Note  Peter Wilson is an 44 y.o. male who presented to [facility] ED under IVC. Per triage note, Pt presented to ED via EMS picked up from gas station, states took medications today but hasn't been taking them like normal, denies SI, states HI with people he lives with.  Denies alcohol or other drugs besides marijuana.  Pt all over the place in triage, not wanting to dress out.  During TTS assessment, Pt presented oriented x 3. Pt was just waking up at 4:30pm after being sleep all morning. Pt reported concerns with seeing "dead people". Pt reported the symptom to start yesterday and to occur only while he is awake at random moments. Pt denied any triggers or recent experience in viewing a dead body. Pt denied any substance use beside marijuana and reported his last use to be yesterday afternoon. Pt reported smoking alone and denied chances of the marijuana being laced with something. Pt reported losing 50lbs in the last two months. Pt reports to be sleeping and eating well. Pt reports to be unsure of any family mental health history. Pt confirmed to have an ACT team Colletta [place], [phone]). Pt reports the need to have more medications due to not receiving any since last night.   Pt denied any current SI or auditory hallucinations. Pt was able to confirm his safety and ability to return home.   Diagnosis:   Past Medical History:  Past Medical History:  Diagnosis Date  . Marijuana abuse   . Schizophrenia, paranoid ([place])   . Tardive dyskinesia   . Tobacco abuse     Past Surgical History:  Procedure Laterality Date  . NO PAST SURGERIES      Family History: History reviewed. No pertinent family history.  Social History:  reports that he has been smoking cigarettes. He has been smoking about 2.00 packs per day. He has never used smokeless tobacco. He reports current alcohol use. He reports current drug use. Frequency: 7.00 times per week. Drug: Marijuana.  Additional Social History:   Alcohol / Drug Use Pain Medications: see mar Prescriptions: see mar Over the Counter: see mar History of alcohol / drug use?: Yes Substance #1 Name of Substance 1: Marijuana 1 - Amount (size/oz): quarter 1 - Frequency: daily 1 - Last Use / Amount: yesterday afternoon  CIWA: CIWA-Ar BP: 107/65 Pulse Rate: 63 COWS:    Allergies:  Allergies  Allergen Reactions  . Penicillins Rash    Did it involve swelling of the face/tongue/throat, SOB, or low BP? U Did it involve sudden or severe rash/hives, skin peeling, or any reaction on the inside of your mouth or nose? Y  Did you need to seek medical attention at a hospital or doctor's office? Y When did it last happen?childhood If all above answers are "NO", may proceed with cephalosporin use.     Home Medications: (Not in a hospital admission)   OB/GYN Status:  No LMP for male patient.  General Assessment Data Location of Assessment: [facility] ED TTS Assessment: In system Is this a Tele or Face-to-Face Assessment?: Face-to-Face Is this an Initial Assessment or a Re-assessment for this encounter?: Initial Assessment Patient Accompanied by:: N/A Language Other than English: No Living Arrangements: Other (Comment)(Private home ) What gender do you identify as?: Male Marital status: Single Pregnancy Status: No Living Arrangements: Alone Can pt return to current living arrangement?: Yes Admission Status: Involuntary Petitioner: Other(EMS) Is patient capable of signing voluntary admission?: Yes Referral Source: Other Insurance type: Medicare   Medical  Screening Exam [facility] Walk-in ONLY) Medical Exam completed: Yes  Crisis Care Plan Living Arrangements: Alone Name of Therapist: ACT therapist Judeth Cornfield)  Education Status Is patient currently in school?: No  Risk to self with the past 6 months Suicidal Ideation: No-Not Currently/Within Last 6 Months Has patient been a risk to self within the past 6 months prior to admission?  : Yes Suicidal Intent: No-Not Currently/Within Last 6 Months Has patient had any suicidal intent within the past 6 months prior to admission? : No(None reported ) Is patient at risk for suicide?: No Suicidal Plan?: No(None reported ) Has patient had any suicidal plan within the past 6 months prior to admission? : No(None reported ) Access to Means: No What has been your use of drugs/alcohol within the last 12 months?: Marijuana  Previous Attempts/Gestures: Yes How many times?: 2 Triggers for Past Attempts: Unknown Intentional Self Injurious Behavior: None Family Suicide History: Unknown Recent stressful life event(s): (None reported ) Persecutory voices/beliefs?: No Depression: No(None reported ) Depression Symptoms: (None reported ) Substance abuse history and/or treatment for substance abuse?: Yes Suicide prevention information given to non-admitted patients: Yes  Risk to Others within the past 6 months Homicidal Ideation: No Does patient have any lifetime risk of violence toward others beyond the six months prior to admission? : Unknown Thoughts of Harm to Others: No-Not Currently Present/Within Last 6 Months Current Homicidal Intent: No Current Homicidal Plan: No Access to Homicidal Means: No History of harm to others?: No Assessment of Violence: In past 6-12 months Violent Behavior Description: Aggression  Does patient have access to weapons?: No(unknown ) Criminal Charges Pending?: Yes Does patient have a court date: Yes Court Date: 04/26/20 Is patient on probation?: Yes  Psychosis Hallucinations: Visual(Dead People) Delusions: None noted  Mental Status Report Appearance/Hygiene: In scrubs Eye Contact: Fair Motor Activity: Unremarkable Speech: Logical/coherent Level of Consciousness: Alert Mood: Anxious Affect: Anxious Anxiety Level: Minimal Thought Processes: Coherent, Relevant Judgement: Partial Orientation: Person, Place, Time, Situation Obsessive  Compulsive Thoughts/Behaviors: None(None reported )  Cognitive Functioning Concentration: Fair Memory: Recent Intact, Remote Intact Is patient IDD: No Insight: Fair Impulse Control: Good Appetite: Good Have you had any weight changes? : Loss(50 lbs in last 2 months ) Amount of the weight change? (lbs): 50 lbs Sleep: No Change Vegetative Symptoms: None  ADLScreening [facility] Services) Patient's cognitive ability adequate to safely complete daily activities?: Yes Patient able to express need for assistance with ADLs?: Yes Independently performs ADLs?: Yes (appropriate for developmental age)  Prior Inpatient Therapy Prior Inpatient Therapy: Yes Prior Therapy Dates: (unknown ) Prior Therapy Facilty/Provider(s): (unknown ) Reason for Treatment: (unknown )  Prior Outpatient Therapy Prior Outpatient Therapy: Yes Prior Therapy Dates: unknown Prior Therapy Facilty/Provider(s): unknown Reason for Treatment: unknown Does patient have an ACCT team?: Yes Does patient have Intensive In-House Services?  : No Does patient have [facility] services? : No Does patient have P4CC services?: No  ADL Screening (condition at time of admission) Patient's cognitive ability adequate to safely complete daily activities?: Yes Is the patient deaf or have difficulty hearing?: No Does the patient have difficulty seeing, even when wearing glasses/contacts?: No Does the patient have difficulty concentrating, remembering, or making decisions?: No Patient able to express need for assistance with ADLs?: Yes Does the patient have difficulty dressing or bathing?: No Independently performs ADLs?: Yes (appropriate for developmental age) Does the patient have difficulty walking or climbing stairs?: No Weakness of Legs: None Weakness of Arms/Hands: None  Home Assistive Devices/Equipment Home  Assistive Devices/Equipment: None  Therapy Consults (therapy consults require a physician order) PT Evaluation  Needed: No OT Evalulation Needed: No SLP Evaluation Needed: No Abuse/Neglect Assessment (Assessment to be complete while patient is alone) Abuse/Neglect Assessment Can Be Completed: Yes Physical Abuse: Denies Verbal Abuse: Denies Sexual Abuse: Denies Exploitation of patient/patient's resources: Denies Self-Neglect: Denies Values / Beliefs Cultural Requests During Hospitalization: None Spiritual Requests During Hospitalization: None Consults Spiritual Care Consult Needed: No Transition of Care Team Consult Needed: No Advance Directives (For Healthcare) Does Patient Have a Medical Advance Directive?: No Would patient like information on creating a medical advance directive?: No - Patient declined          Disposition:  Pending psych consult Disposition Initial Assessment Completed for this Encounter: Yes  On Site Evaluation by:   Reviewed with Physician:    Opal Sidles 04/07/2020 7:03 PM

## 2020-04-07 NOTE — ED Notes (Signed)

## 2020-04-08 NOTE — Consult Note (Signed)
[facility] Face-to-Face Psychiatry Consult   Reason for Consult: Psychiatric evaluation Referring Physician: Dr. Marisa SeverinSiadecki Patient Identification: Peter Wilson MRN:  [id number] Principal Diagnosis: <principal problem not specified> Diagnosis:  Active Problems:   Tobacco use disorder   Cannabis use disorder, moderate, dependence (HCC)   Antisocial traits   Schizoaffective disorder, bipolar type (HCC)   Noncompliance   Schizophrenia (HCC)   Tremor   Substance induced mood disorder (HCC)   Aggressive behavior   Total Time spent with patient: 30 minutes  Subjective: "I do not want to hear about my fu*$ing family."  Peter Wilson is a 44 y.o. male patient presented [facility] ED via EMS under involuntary commitment status (IVC).  Per the ED triage nursing note, the patient was picked up from a gas station. The patient disclosed that he took medication today but has not been taking them regularly.  The patient denies SI; he voiced HI with people he lives with. The patient prohibits alcohol or other drug use besides marijuana. During triage assessment, it was reported the patient was " Pt all over the place in triage, not wanting to dress out." During his assessment, he became irate when asked what happened at home to come to the hospital.  He began screaming, using profanity, stating, "don't ask me about those people."  "You asked me about them. I am never going to talk to you again." Seen face-to-face by this provider; chart reviewed and consulted with Dr. Mayford KnifeWilliams on 04/07/2020 due to the patient's care. It was discussed with the EDP that the patient would remain on his home medication due to him not being compliant at home and monitor until he is stable and can be discharged back home. The patient is alert and oriented x 2-3, agitated but cooperative, and mood-congruent with affect on evaluation. The patient does not appear to be responding to internal or external stimuli. Neither is presenting with  delusional thinking. The patient admits to auditory and visual hallucinations. The patient denies any suicidal ideations but voices homicidal ideation towards family members. The patient is presenting with some psychotic and paranoid behaviors. During an encounter with the patient, he was unable to answer questions appropriately.  Plan: The patient will remain on his home medication due to him not being compliant at home, and he will be monitor until he is stable and can be discharged back home  HPI: Per Dr. Marisa SeverinSiadecki : Peter Wilson is a 44 y.o. male with PMH as noted below including schizophrenia who presents with a complaint that his mind is not right.  The patient states that he has not been on his medications for a few months.  He reports feeling depressed, anxious, and reports homicidal ideation towards a nephew.  He also described paranoid and delusional thoughts about deceased family members.  He denies acute medical complaints.  Past Psychiatric History:  Substance-induced mood disorder (HCC) Aggressive behavior Tremor Schizophrenia (HCC) Noncompliance Schizoaffective disorder, bipolar type (HCC) Antisocial traits Tobacco use disorder Cannabis use disorder, moderate, dependence (HCC)  Risk to Self: Suicidal Ideation: No-Not Currently/Within Last 6 Months Suicidal Intent: No-Not Currently/Within Last 6 Months Is patient at risk for suicide?: No Suicidal Plan?: No(None reported ) Access to Means: No What has been your use of drugs/alcohol within the last 12 months?: Marijuana  How many times?: 2 Triggers for Past Attempts: Unknown Intentional Self Injurious Behavior: None Risk to Others: Homicidal Ideation: No Thoughts of Harm to Others: No-Not Currently Present/Within Last 6 Months Current Homicidal Intent: No Current  Homicidal Plan: No Access to Homicidal Means: No History of harm to others?: No Assessment of Violence: In past 6-12 months Violent Behavior Description:  Aggression  Does patient have access to weapons?: No(unknown ) Criminal Charges Pending?: Yes Does patient have a court date: Yes Court Date: 04/26/20 Prior Inpatient Therapy: Prior Inpatient Therapy: Yes Prior Therapy Dates: (unknown ) Prior Therapy Facilty/Provider(s): (unknown ) Reason for Treatment: (unknown ) Prior Outpatient Therapy: Prior Outpatient Therapy: Yes Prior Therapy Dates: unknown Prior Therapy Facilty/Provider(s): unknown Reason for Treatment: unknown Does patient have an ACCT team?: Yes Does patient have Intensive In-House Services?  : No Does patient have [facility] services? : No Does patient have P4CC services?: No  Past Medical History:  Past Medical History:  Diagnosis Date  . Marijuana abuse   . Schizophrenia, paranoid (HCC)   . Tardive dyskinesia   . Tobacco abuse     Past Surgical History:  Procedure Laterality Date  . NO PAST SURGERIES     Family History: History reviewed. No pertinent family history. Family Psychiatric  History:  Social History:  Social History   Substance and Sexual Activity  Alcohol Use Yes  . Alcohol/week: 0.0 standard drinks   Comment: Unknown      Social History   Substance and Sexual Activity  Drug Use Yes  . Frequency: 7.0 times per week  . Types: Marijuana   Comment: Unknown     Social History   Socioeconomic History  . Marital status: Single    Spouse name: Not on file  . Number of children: Not on file  . Years of education: Not on file  . Highest education level: Not on file  Occupational History  . Occupation: Disability  Tobacco Use  . Smoking status: Current Every Day Smoker    Packs/day: 2.00    Types: Cigarettes  . Smokeless tobacco: Never Used  . Tobacco comment: Unknown   Substance and Sexual Activity  . Alcohol use: Yes    Alcohol/week: 0.0 standard drinks    Comment: Unknown   . Drug use: Yes    Frequency: 7.0 times per week    Types: Marijuana    Comment: Unknown   . Sexual  activity: Not on file  Other Topics Concern  . Not on file  Social History Narrative   Pt discharged from [facility] on 10/10/2019, was assigned to a hotel in [place] by his ACT[facility] provider[facility] [place], [place].     Social Determinants of Health   Financial Resource Strain:   . Difficulty of Paying Living Expenses:   Food Insecurity:   . Worried About Programme researcher, broadcasting/film/video in the Last Year:   . Barista in the Last Year:   Transportation Needs:   . Freight forwarder (Medical):   Marland Kitchen Lack of Transportation (Non-Medical):   Physical Activity:   . Days of Exercise per Week:   . Minutes of Exercise per Session:   Stress:   . Feeling of Stress :   Social Connections:   . Frequency of Communication with Friends and Family:   . Frequency of Social Gatherings with Friends and Family:   . Attends Religious Services:   . Active Member of Clubs or Organizations:   . Attends Banker Meetings:   Marland Kitchen Marital Status:    Additional Social History:    Allergies:   Allergies  Allergen Reactions  . Penicillins Rash    Did it involve swelling of the face/tongue/throat, SOB,  or low BP? U Did it involve sudden or severe rash/hives, skin peeling, or any reaction on the inside of your mouth or nose? Y  Did you need to seek medical attention at a hospital or doctor's office? Y When did it last happen?childhood If all above answers are "NO", may proceed with cephalosporin use.     Labs:  Results for orders placed or performed during the hospital encounter of 04/06/20 (from the past 48 hour(s))  Comprehensive metabolic panel     Status: Abnormal   Collection Time: 04/06/20  9:46 PM  Result Value Ref Range   Sodium 139 135 - 145 mmol/L   Potassium 3.6 3.5 - 5.1 mmol/L   Chloride 103 98 - 111 mmol/L   CO2 25 22 - 32 mmol/L   Glucose, Bld 124 (H) 70 - 99 mg/dL    Comment: Glucose reference range applies only to samples taken after fasting for at least  8 hours.   BUN 9 6 - 20 mg/dL   Creatinine, Ser 0.93 0.61 - 1.24 mg/dL   Calcium 9.2 8.9 - 10.3 mg/dL   Total Protein 8.1 6.5 - 8.1 g/dL   Albumin 4.5 3.5 - 5.0 g/dL   AST 34 15 - 41 U/L   ALT 10 0 - 44 U/L   Alkaline Phosphatase 61 38 - 126 U/L   Total Bilirubin 0.6 0.3 - 1.2 mg/dL   GFR calc non Af Amer >60 >60 mL/min   GFR calc Af Amer >60 >60 mL/min   Anion gap 11 5 - 15    Comment: Performed at [facility], [address], [place], [place] [address]  Ethanol     Status: None   Collection Time: 04/06/20  9:46 PM  Result Value Ref Range   Alcohol, Ethyl (B) <10 <10 mg/dL    Comment: (NOTE) Lowest detectable limit for serum alcohol is 10 mg/dL. For medical purposes only. Performed at [facility], [address] [place], [place] [address]   Salicylate level     Status: Abnormal   Collection Time: 04/06/20  9:46 PM  Result Value Ref Range   Salicylate Lvl <5.2 (L) 7.0 - 30.0 mg/dL    Comment: Performed at [facility], [address] [place], [place] [address]  Acetaminophen level     Status: Abnormal   Collection Time: 04/06/20  9:46 PM  Result Value Ref Range   Acetaminophen (Tylenol), Serum <10 (L) 10 - 30 ug/mL    Comment: (NOTE) Therapeutic concentrations vary significantly. A range of 10-30 ug/mL  may be an effective concentration for many patients. However, some  are best treated at concentrations outside of this range. Acetaminophen concentrations >150 ug/mL at 4 hours after ingestion  and >50 ug/mL at 12 hours after ingestion are often associated with  toxic reactions. Performed at [facility], [address] [place],  [address]   cbc     Status: Abnormal   Collection Time: 04/06/20  9:46 PM  Result Value Ref Range   WBC 6.0 4.0 - 10.5 K/uL   RBC 4.12 (L) 4.22 - 5.81 MIL/uL   Hemoglobin 11.8 (L) 13.0 - 17.0 g/dL   HCT 37.7 (L) 39.0 - 52.0 %   MCV 91.5 80.0 - 100.0 fL   MCH 28.6 26.0 - 34.0 pg   MCHC 31.3  30.0 - 36.0 g/dL   RDW 13.5 11.5 - 15.5 %   Platelets 281 150 - 400 K/uL   nRBC 0.0 0.0 -  0.2 %    Comment: Performed at [facility], [address] [place], [place] [address]    Current Facility-Administered Medications  Medication Dose Route Frequency Provider Last Rate Last Admin  . benztropine (COGENTIN) tablet 1 mg  1 mg Oral BID Dionne Bucy, MD   1 mg at 04/07/20 2102  . busPIRone (BUSPAR) tablet 10 mg  10 mg Oral BID Dionne Bucy, MD   10 mg at 04/07/20 2102  . clonazePAM (KLONOPIN) tablet 0.5 mg  0.5 mg Oral BID Dionne Bucy, MD   0.5 mg at 04/07/20 2101  . divalproex (DEPAKOTE) DR tablet 750 mg  750 mg Oral BID Dionne Bucy, MD   750 mg at 04/07/20 2101  . gabapentin (NEURONTIN) tablet 600 mg  600 mg Oral TID Dionne Bucy, MD   600 mg at 04/07/20 2101  . mirtazapine (REMERON) tablet 15 mg  15 mg Oral QHS Dionne Bucy, MD   15 mg at 04/07/20 2102  . QUEtiapine (SEROQUEL) tablet 100 mg  100 mg Oral QHS Dionne Bucy, MD   100 mg at 04/07/20 2101  . risperiDONE (RISPERDAL) tablet 3 mg  3 mg Oral TID Dionne Bucy, MD   3 mg at 04/07/20 2102  . temazepam (RESTORIL) capsule 30 mg  30 mg Oral QHS PRN Dionne Bucy, MD       Current Outpatient Medications  Medication Sig Dispense Refill  . benztropine (COGENTIN) 1 MG tablet Take 1 tablet (1 mg total) by mouth 2 (two) times daily. 60 tablet 0  . busPIRone (BUSPAR) 10 MG tablet Take 10 mg by mouth 2 (two) times daily.    . clonazePAM (KLONOPIN) 0.5 MG tablet Take 0.5 mg by mouth 2 (two) times daily as needed.    . gabapentin (NEURONTIN) 300 MG capsule Take 2 capsules (600 mg total) by mouth 3 (three) times daily. 180 capsule 0  . haloperidol decanoate (HALDOL DECANOATE) 100 MG/ML injection     . mirtazapine (REMERON) 15 MG tablet Take 15 mg by mouth at bedtime.    Marland Kitchen QUEtiapine (SEROQUEL) 100 MG tablet Take 100 mg by mouth at bedtime.      Musculoskeletal: Strength  & Muscle Tone: within normal limits Gait & Station: normal Patient leans: N/A  Psychiatric Specialty Exam: Physical Exam  Nursing note and vitals reviewed. Constitutional: He is oriented to person, place, and time. He appears well-developed and well-nourished.  Respiratory: Effort normal.  Musculoskeletal:        General: Normal range of motion.     Cervical back: Normal range of motion and neck supple.  Neurological: He is alert and oriented to person, place, and time.    Review of Systems  Psychiatric/Behavioral: Positive for agitation, behavioral problems and hallucinations. The patient is nervous/anxious.   All other systems reviewed and are negative.   Blood pressure (!) 108/59, pulse 85, temperature 98.3 F (36.8 C), temperature source Oral, resp. rate 18, height 6' (1.829 m), weight 77.1 kg, SpO2 100 %.Body mass index is 23.06 kg/m.  General Appearance: Bizarre and Casual  Eye Contact:  Poor  Speech:  Garbled and Pressured  Volume:  Increased  Mood:  Angry, Anxious, Euphoric and Irritable  Affect:  Congruent, Inappropriate and Full Range  Thought Process:  Coherent  Orientation:  Full (Time, Place, and Person)  Thought Content:  Illogical, Delusions and Hallucinations: Auditory  Suicidal Thoughts:  No  Homicidal Thoughts:  Yes.  without intent/plan  Memory:  Immediate;   Fair Recent;   Fair  Remote;   Fair  Judgement:  Impaired  Insight:  Lacking  Psychomotor Activity:  Increased  Concentration:  Concentration: Poor and Attention Span: Poor  Recall:  [employer] of Knowledge:  Fair  Language:  Fair  Akathisia:  Negative  Handed:  Right  AIMS (if indicated):     Assets:  Manufacturing systems engineer Housing Leisure Time Resilience  ADL's:  Intact  Cognition:  Impaired,  Mild  Sleep:        Treatment Plan Summary: Medication management and Plan The patient will remain on the observation overnight restart him on his medication and monitor him as to him not being  compliant with medication which does cause him to decompensate.  Disposition: Patient does not meet criteria for psychiatric inpatient admission. Supportive therapy provided about ongoing stressors. The patient will be restarted on all home medication and remain on observation overnight and reassess to determine if he meets criteria for psychiatric inpatient admission or he could be discharged back home.  Gillermo Murdoch, NP 04/08/2020 5:09 AM

## 2020-04-08 NOTE — ED Notes (Signed)
IVC PAPERS  RESCINDED  INFORMED  RN  AMY  TEAGUE 

## 2020-04-08 NOTE — ED Provider Notes (Signed)
Emergency Medicine Observation Re-evaluation Note  Peter Wilson is a 44 y.o. male, seen on rounds today.  Pt initially presented to the ED for complaints of Psychiatric Evaluation Currently, the patient is resting.  Physical Exam  BP (!) 108/59 (BP Location: Right Arm)   Pulse 85   Temp 98.3 F (36.8 C) (Oral)   Resp 18   Ht 1.829 m (6')   Wt 77.1 kg   SpO2 100%   BMI 23.06 kg/m  Physical Exam  ED Course / MDM  EKG:    I have reviewed the labs performed to date as well as medications administered while in observation.  Recent changes in the last 24 hours include nothing. Plan  Current plan is for reassessment by behavioral medicine today to determine disposition. Patient is under full IVC at this time.   Loleta Rose, MD 04/08/20 [id number]

## 2020-04-08 NOTE — Consult Note (Signed)
[facility]   Reason for Consult: Psychiatric evaluation Referring Physician: Dr. Marisa SeverinSiadecki Patient Identification: Peter Wilson MRN:  [id number] Principal Diagnosis: <principal problem not specified> Diagnosis:  Active Problems:   Tobacco use disorder   Cannabis use disorder, moderate, dependence (HCC)   Antisocial traits   Schizoaffective disorder, bipolar type (HCC)   Noncompliance   Schizophrenia (HCC)   Tremor   Substance induced mood disorder (HCC)   Aggressive behavior   Total Time spent with patient: 15 minutes  Seen today. He is calm and pleasant. NO further need for IVC. He has ACT involvement.  Subjective: per prior psych eval yesterday Peter Wilson is a 44 y.o. male patient [facility] ED via EMS under involuntary commitment status (IVC).  Per the ED triage nursing note, the patient was picked up from a gas station. The patient disclosed that he took medication today but has not been taking them regularly.  The patient denies SI; he voiced HI with people he lives with. The patient prohibits alcohol or other drug use besides marijuana. During triage assessment, it was reported the patient was " Pt all over the place in triage, not wanting to dress out." During his assessment, he became irate when asked what happened at home to come to the hospital.  He began screaming, using profanity, stating, "don't ask me about those people."  "You asked me about them. I am never going to talk to you again." Seen face-to-face by this provider; chart reviewed and consulted with Dr. Mayford KnifeWilliams on 04/07/2020 due to the patient's care. It was discussed with the EDP that the patient would remain on his home medication due to him not being compliant at home and monitor until he is stable and can be discharged back home. The patient is alert and oriented x 2-3, agitated but cooperative, and mood-congruent with affect on evaluation. The patient does not appear to be  responding to internal or external stimuli. Neither is presenting with delusional thinking. The patient admits to auditory and visual hallucinations. The patient denies any suicidal ideations but voices homicidal ideation towards family members. The patient is presenting with some psychotic and paranoid behaviors. During an encounter with the patient, he was unable to answer questions appropriately.  Plan: The patient will remain on his home medication due to him not being compliant at home, and he will be monitor until he is stable and can be discharged back home  HPI: Per Dr. Marisa SeverinSiadecki : Peter Wilson is a 44 y.o. male with PMH as noted below including schizophrenia who presents with a complaint that his mind is not right.  The patient states that he has not been on his medications for a few months.  He reports feeling depressed, anxious, and reports homicidal ideation towards a nephew.  He also described paranoid and delusional thoughts about deceased family members.  He denies acute medical complaints.  Past Psychiatric History:  Substance-induced mood disorder (HCC) Aggressive behavior Tremor Schizophrenia (HCC) Noncompliance Schizoaffective disorder, bipolar type (HCC) Antisocial traits Tobacco use disorder Cannabis use disorder, moderate, dependence (HCC)  Risk to Self: Suicidal Ideation: No-Not Currently/Within Last 6 Months Suicidal Intent: No-Not Currently/Within Last 6 Months Is patient at risk for suicide?: No Suicidal Plan?: No(None reported ) Access to Means: No What has been your use of drugs/alcohol within the last 12 months?: Marijuana  How many times?: 2 Triggers for Past Attempts: Unknown Intentional Self Injurious Behavior: None Risk to Others: Homicidal Ideation: No Thoughts of Harm to Others:  No-Not Currently Present/Within Last 6 Months Current Homicidal Intent: No Current Homicidal Plan: No Access to Homicidal Means: No History of harm to others?: No Assessment  of Violence: In past 6-12 months Violent Behavior Description: Aggression  Does patient have access to weapons?: No(unknown ) Criminal Charges Pending?: Yes Does patient have a court date: Yes Court Date: 04/26/20 Prior Inpatient Therapy: Prior Inpatient Therapy: Yes Prior Therapy Dates: (unknown ) Prior Therapy Facilty/Provider(s): (unknown ) Reason for Treatment: (unknown ) Prior Outpatient Therapy: Prior Outpatient Therapy: Yes Prior Therapy Dates: unknown Prior Therapy Facilty/Provider(s): unknown Reason for Treatment: unknown Does patient have an ACCT team?: Yes Does patient have Intensive In-House Services?  : No Does patient have [facility] services? : No Does patient have P4CC services?: No  Past Medical History:  Past Medical History:  Diagnosis Date  . Marijuana abuse   . Schizophrenia, paranoid (HCC)   . Tardive dyskinesia   . Tobacco abuse     Past Surgical History:  Procedure Laterality Date  . NO PAST SURGERIES     Family History: History reviewed. No pertinent family history. Family Psychiatric  History:  Social History:  Social History   Substance and Sexual Activity  Alcohol Use Yes  . Alcohol/week: 0.0 standard drinks   Comment: Unknown      Social History   Substance and Sexual Activity  Drug Use Yes  . Frequency: 7.0 times per week  . Types: Marijuana   Comment: Unknown     Social History   Socioeconomic History  . Marital status: Single    Spouse name: Not on file  . Number of children: Not on file  . Years of education: Not on file  . Highest education level: Not on file  Occupational History  . Occupation: Disability  Tobacco Use  . Smoking status: Current Every Day Smoker    Packs/day: 2.00    Types: Cigarettes  . Smokeless tobacco: Never Used  . Tobacco comment: Unknown   Substance and Sexual Activity  . Alcohol use: Yes    Alcohol/week: 0.0 standard drinks    Comment: Unknown   . Drug use: Yes    Frequency: 7.0 times per  week    Types: Marijuana    Comment: Unknown   . Sexual activity: Not on file  Other Topics Concern  . Not on file  Social History Narrative   Pt discharged from [facility] on 10/10/2019, was assigned to a hotel in [place] by his ACTT provider[facility] [place], [place].     Social Determinants of Health   Financial Resource Strain:   . Difficulty of Paying Living Expenses:   Food Insecurity:   . Worried About Programme researcher, broadcasting/film/video in the Last Year:   . Barista in the Last Year:   Transportation Needs:   . Freight forwarder (Medical):   Marland Kitchen Lack of Transportation (Non-Medical):   Physical Activity:   . Days of Exercise per Week:   . Minutes of Exercise per Session:   Stress:   . Feeling of Stress :   Social Connections:   . Frequency of Communication with Friends and Family:   . Frequency of Social Gatherings with Friends and Family:   . Attends Religious Services:   . Active Member of Clubs or Organizations:   . Attends Banker Meetings:   Marland Kitchen Marital Status:    Additional Social History:    Allergies:   Allergies  Allergen Reactions  . Penicillins Rash  Did it involve swelling of the face/tongue/throat, SOB, or low BP? U Did it involve sudden or severe rash/hives, skin peeling, or any reaction on the inside of your mouth or nose? Y  Did you need to seek medical attention at a hospital or doctor's office? Y When did it last happen?childhood If all above answers are "NO", may proceed with cephalosporin use.     Labs:  Results for orders placed or performed during the hospital encounter of 04/06/20 (from the past 48 hour(s))  Comprehensive metabolic panel     Status: Abnormal   Collection Time: 04/06/20  9:46 PM  Result Value Ref Range   Sodium 139 135 - 145 mmol/L   Potassium 3.6 3.5 - 5.1 mmol/L   Chloride 103 98 - 111 mmol/L   CO2 25 22 - 32 mmol/L   Glucose, Bld 124 (H) 70 - 99 mg/dL    Comment: Glucose reference  range applies only to samples taken after fasting for at least 8 hours.   BUN 9 6 - 20 mg/dL   Creatinine, Ser 0.93 0.61 - 1.24 mg/dL   Calcium 9.2 8.9 - 10.3 mg/dL   Total Protein 8.1 6.5 - 8.1 g/dL   Albumin 4.5 3.5 - 5.0 g/dL   AST 34 15 - 41 U/L   ALT 10 0 - 44 U/L   Alkaline Phosphatase 61 38 - 126 U/L   Total Bilirubin 0.6 0.3 - 1.2 mg/dL   GFR calc non Af Amer >60 >60 mL/min   GFR calc Af Amer >60 >60 mL/min   Anion gap 11 5 - 15    Comment: Performed at [facility], [address], [place], [place] [address]  Ethanol     Status: None   Collection Time: 04/06/20  9:46 PM  Result Value Ref Range   Alcohol, Ethyl (B) <10 <10 mg/dL    Comment: (NOTE) Lowest detectable limit for serum alcohol is 10 mg/dL. For medical purposes only. Performed at [facility], [address] [place], [place] [address]   Salicylate level     Status: Abnormal   Collection Time: 04/06/20  9:46 PM  Result Value Ref Range   Salicylate Lvl <4.2 (L) 7.0 - 30.0 mg/dL    Comment: Performed at [facility], [address] [place], [place] [address]  Acetaminophen level     Status: Abnormal   Collection Time: 04/06/20  9:46 PM  Result Value Ref Range   Acetaminophen (Tylenol), Serum <10 (L) 10 - 30 ug/mL    Comment: (NOTE) Therapeutic concentrations vary significantly. A range of 10-30 ug/mL  may be an effective concentration for many patients. However, some  are best treated at concentrations outside of this range. Acetaminophen concentrations >150 ug/mL at 4 hours after ingestion  and >50 ug/mL at 12 hours after ingestion are often associated with  toxic reactions. Performed at [facility], [address] [place], [place] [address]   cbc     Status: Abnormal   Collection Time: 04/06/20  9:46 PM  Result Value Ref Range   WBC 6.0 4.0 - 10.5 K/uL   RBC 4.12 (L) 4.22 - 5.81 MIL/uL   Hemoglobin 11.8 (L) 13.0 - 17.0 g/dL   HCT 37.7 (L) 39.0 - 52.0 %    MCV 91.5 80.0 - 100.0 fL   MCH 28.6 26.0 - 34.0 pg   MCHC 31.3 30.0 - 36.0 g/dL   RDW 13.5 11.5 - 15.5 %   Platelets 281 150 -  400 K/uL   nRBC 0.0 0.0 - 0.2 %    Comment: Performed at [facility], [address] [place], [place] [address]    Current Facility-Administered Medications  Medication Dose Route Frequency Provider Last Rate Last Admin  . benztropine (COGENTIN) tablet 1 mg  1 mg Oral BID Dionne Bucy, MD   1 mg at 04/08/20 1006  . busPIRone (BUSPAR) tablet 10 mg  10 mg Oral BID Dionne Bucy, MD   10 mg at 04/08/20 1008  . clonazePAM (KLONOPIN) tablet 0.5 mg  0.5 mg Oral BID Dionne Bucy, MD   0.5 mg at 04/08/20 1007  . divalproex (DEPAKOTE) DR tablet 750 mg  750 mg Oral BID Dionne Bucy, MD   750 mg at 04/08/20 1006  . gabapentin (NEURONTIN) tablet 600 mg  600 mg Oral TID Dionne Bucy, MD   600 mg at 04/08/20 1007  . mirtazapine (REMERON) tablet 15 mg  15 mg Oral QHS Dionne Bucy, MD   15 mg at 04/07/20 2102  . QUEtiapine (SEROQUEL) tablet 100 mg  100 mg Oral QHS Dionne Bucy, MD   100 mg at 04/07/20 2101  . risperiDONE (RISPERDAL) tablet 3 mg  3 mg Oral TID Dionne Bucy, MD   3 mg at 04/08/20 1008  . temazepam (RESTORIL) capsule 30 mg  30 mg Oral QHS PRN Dionne Bucy, MD       Current Outpatient Medications  Medication Sig Dispense Refill  . benztropine (COGENTIN) 1 MG tablet Take 1 tablet (1 mg total) by mouth 2 (two) times daily. 60 tablet 0  . busPIRone (BUSPAR) 10 MG tablet Take 10 mg by mouth 2 (two) times daily.    . clonazePAM (KLONOPIN) 0.5 MG tablet Take 0.5 mg by mouth 2 (two) times daily as needed.    . gabapentin (NEURONTIN) 300 MG capsule Take 2 capsules (600 mg total) by mouth 3 (three) times daily. 180 capsule 0  . haloperidol decanoate (HALDOL DECANOATE) 100 MG/ML injection     . mirtazapine (REMERON) 15 MG tablet Take 15 mg by mouth at bedtime.    Marland Kitchen QUEtiapine (SEROQUEL) 100 MG tablet  Take 100 mg by mouth at bedtime.       Blood pressure (!) 108/59, pulse 85, temperature 98.3 F (36.8 C), temperature source Oral, resp. rate 18, height 6' (1.829 m), weight 77.1 kg, SpO2 100 %.Body mass index is 23.06 kg/m.  General Appearance: Casual  Eye Contact:  Good  Speech:  Clear and Coherent  Volume:  Increased  Mood:  Euthymic  Affect:  Congruent and Full Range  Thought Process:  Coherent  Orientation:  Full (Time, Place, and Person)  Thought Content:  WDL  Suicidal Thoughts:  No  Homicidal Thoughts:  No  Memory:  Immediate;   Good  Judgement:  Fair  Insight:  Fair and Lacking  Psychomotor Activity:  Increased  Concentration:  Concentration: Poor  Recall:  Fair  Fund of Knowledge:  Fair  Language:  Fair  Akathisia:  Negative  Handed:  Right  AIMS (if indicated):     Assets:  Manufacturing systems engineer Housing Leisure Time Resilience  ADL's:  Intact  Cognition:  Impaired,  Mild  Sleep:        Treatment Plan Summary: Coordinate with outpatient team a return to his normal treatment environment  Disposition: Patient does not meet criteria for psychiatric inpatient admission. Supportive therapy provided about ongoing stressors.   D/C back to his usual living and care environment  Cindy Hazy, MD  04/08/2020 3:08 PM

## 2020-04-11 ENCOUNTER — Emergency Department
Admission: EM | Admit: 2020-04-11 | Discharge: 2020-04-11 | Disposition: A | Discharge status: Home / Self Care | Payer: Medicare Other | Attending: Emergency Medicine | Admitting: Emergency Medicine

## 2020-04-11 ENCOUNTER — Other Ambulatory Visit: Payer: Self-pay

## 2020-04-11 ENCOUNTER — Encounter: Payer: Self-pay | Admitting: Emergency Medicine

## 2020-04-11 ENCOUNTER — Emergency Department (EMERGENCY_DEPARTMENT_HOSPITAL)
Admission: EM | Admit: 2020-04-11 | Discharge: 2020-04-12 | Disposition: A | Discharge status: Home / Self Care | Payer: Medicare Other | Source: Home / Self Care | Attending: Emergency Medicine | Admitting: Emergency Medicine

## 2020-04-11 DIAGNOSIS — F209 Schizophrenia, unspecified: Secondary | ICD-10-CM | POA: Insufficient documentation

## 2020-04-11 DIAGNOSIS — Z79899 Other long term (current) drug therapy: Secondary | ICD-10-CM | POA: Insufficient documentation

## 2020-04-11 DIAGNOSIS — R519 Headache, unspecified: Secondary | ICD-10-CM

## 2020-04-11 DIAGNOSIS — F918 Other conduct disorders: Secondary | ICD-10-CM | POA: Diagnosis not present

## 2020-04-11 DIAGNOSIS — F17210 Nicotine dependence, cigarettes, uncomplicated: Secondary | ICD-10-CM | POA: Insufficient documentation

## 2020-04-11 DIAGNOSIS — R4689 Other symptoms and signs involving appearance and behavior: Secondary | ICD-10-CM | POA: Diagnosis not present

## 2020-04-11 LAB — COMPREHENSIVE METABOLIC PANEL
ALT: 10 U/L (ref 0–44)
AST: 29 U/L (ref 15–41)
Albumin: 4.3 g/dL (ref 3.5–5.0)
Alkaline Phosphatase: 60 U/L (ref 38–126)
Anion gap: 7 (ref 5–15)
BUN: 15 mg/dL (ref 6–20)
CO2: 30 mmol/L (ref 22–32)
Calcium: 9.3 mg/dL (ref 8.9–10.3)
Chloride: 104 mmol/L (ref 98–111)
Creatinine, Ser: 1.09 mg/dL (ref 0.61–1.24)
GFR calc Af Amer: >60 mL/min (ref >60)
GFR calc non Af Amer: >60 mL/min (ref >60)
Glucose, Bld: 122 mg/dL — ABNORMAL HIGH (ref 70–99)
Potassium: 4 mmol/L (ref 3.5–5.1)
Sodium: 141 mmol/L (ref 135–145)
Total Bilirubin: 0.9 mg/dL (ref 0.3–1.2)
Total Protein: 8 g/dL (ref 6.5–8.1)

## 2020-04-11 LAB — URINALYSIS, COMPLETE (UACMP) WITH MICROSCOPIC
Bilirubin Urine: NEGATIVE
Glucose, UA: NEGATIVE mg/dL
Hgb urine dipstick: NEGATIVE
Ketones, ur: 5 mg/dL — AB
Leukocytes,Ua: NEGATIVE
Nitrite: NEGATIVE
Protein, ur: 30 mg/dL — AB
Specific Gravity, Urine: 1.019 (ref 1.005–1.030)
pH: 6 (ref 5.0–8.0)

## 2020-04-11 LAB — ETHANOL: Alcohol, Ethyl (B): <10 mg/dL (ref <10)

## 2020-04-11 LAB — CBC
HCT: 39.8 % (ref 39.0–52.0)
Hemoglobin: 12.7 g/dL — ABNORMAL LOW (ref 13.0–17.0)
MCH: 28.5 pg (ref 26.0–34.0)
MCHC: 31.9 g/dL (ref 30.0–36.0)
MCV: 89.2 fL (ref 80.0–100.0)
Platelets: 280 10*3/uL (ref 150–400)
RBC: 4.46 MIL/uL (ref 4.22–5.81)
RDW: 13.5 % (ref 11.5–15.5)
WBC: 6.2 10*3/uL (ref 4.0–10.5)
nRBC: 0 % (ref 0.0–0.2)

## 2020-04-11 LAB — URINE DRUG SCREEN, QUALITATIVE (ARMC ONLY)
Amphetamines, Ur Screen: NOT DETECTED
Barbiturates, Ur Screen: NOT DETECTED
Benzodiazepine, Ur Scrn: NOT DETECTED
Cannabinoid 50 Ng, Ur ~~LOC~~: POSITIVE — AB
Cocaine Metabolite,Ur ~~LOC~~: NOT DETECTED
MDMA (Ecstasy)Ur Screen: NOT DETECTED
Methadone Scn, Ur: NOT DETECTED
Opiate, Ur Screen: NOT DETECTED
Phencyclidine (PCP) Ur S: NOT DETECTED
Tricyclic, Ur Screen: NOT DETECTED

## 2020-04-11 LAB — SALICYLATE LEVEL: Salicylate Lvl: <7 mg/dL — ABNORMAL LOW (ref 7.0–30.0)

## 2020-04-11 LAB — ACETAMINOPHEN LEVEL: Acetaminophen (Tylenol), Serum: <10 ug/mL — ABNORMAL LOW (ref 10–30)

## 2020-04-11 MED ORDER — ASPIRIN-ACETAMINOPHEN-CAFFEINE 250-250-65 MG PO TABS
2.0000 | ORAL_TABLET | Freq: Once | ORAL | Status: AC
  Administered 2020-04-11: 2 via ORAL
  Filled 2020-04-11 (×2): qty 2

## 2020-04-11 MED ORDER — BUSPIRONE HCL 5 MG PO TABS
10.0000 mg | ORAL_TABLET | Freq: Two times a day (BID) | ORAL | Status: DC
  Administered 2020-04-11 – 2020-04-12 (×2): 10 mg via ORAL
  Filled 2020-04-11 (×2): qty 2

## 2020-04-11 MED ORDER — QUETIAPINE FUMARATE 25 MG PO TABS
100.0000 mg | ORAL_TABLET | Freq: Every day | ORAL | Status: DC
  Administered 2020-04-11: 100 mg via ORAL
  Filled 2020-04-11: qty 4

## 2020-04-11 MED ORDER — GABAPENTIN 300 MG PO CAPS
600.0000 mg | ORAL_CAPSULE | Freq: Three times a day (TID) | ORAL | Status: DC
  Administered 2020-04-11 – 2020-04-12 (×2): 600 mg via ORAL
  Filled 2020-04-11 (×2): qty 2

## 2020-04-11 MED ORDER — MIRTAZAPINE 15 MG PO TABS
15.0000 mg | ORAL_TABLET | Freq: Every day | ORAL | Status: DC
  Administered 2020-04-11: 23:00:00 15 mg via ORAL
  Filled 2020-04-11: qty 1

## 2020-04-11 MED ORDER — CLONAZEPAM 0.5 MG PO TABS: 0.5000 mg | ORAL_TABLET | Freq: Two times a day (BID) | ORAL | Status: DC | PRN

## 2020-04-11 MED ORDER — CYCLOBENZAPRINE HCL 5 MG PO TABS: 5.0000 mg | ORAL_TABLET | Freq: Three times a day (TID) | ORAL | 0 refills | Status: DC | PRN

## 2020-04-11 MED ORDER — BENZTROPINE MESYLATE 1 MG PO TABS
1.0000 mg | ORAL_TABLET | Freq: Two times a day (BID) | ORAL | Status: DC
  Administered 2020-04-11 – 2020-04-12 (×2): 1 mg via ORAL
  Filled 2020-04-11 (×2): qty 1

## 2020-04-11 NOTE — ED Notes (Signed)
This RN was outside assisting another pt, pt stopped this RN asking to speak to a psychiatrist. When asked if having thoughts of harming himself or others, pt stated he was thinking about harming his sister. This RN explained he would need to check back in to speak to a psychiatrist and assisted pt to speak to first nurse

## 2020-04-11 NOTE — ED Triage Notes (Signed)
First nurse note-arrived EMS for migraine. Ambulatory. NAD. VSS

## 2020-04-11 NOTE — ED Triage Notes (Signed)
FIRST NURSE NOTE- pt checking back in. Wants to speak with a psychiatrist and get his psych meds.

## 2020-04-11 NOTE — Discharge Instructions (Addendum)
Your exam is normal at this time. Take the OTC Excedrin Migraine Relief as directed. Take the muscle relaxant as needed. Follow-up with Dr. Ellsworth Lennox as planned.  Return if needed.

## 2020-04-11 NOTE — ED Provider Notes (Signed)
[facility] Emergency Department Provider Note   ____________________________________________    I have reviewed the triage vital signs and the nursing notes.   HISTORY  Chief Complaint Homicidal     HPI Peter Wilson is a 44 y.o. male with history of schizophrenia and aggressive behavior who complains of increasing paranoia and is also threatening to "beat the shit out of "his sister because he alleges she stole money from him.  Patient reports he needs adjustment of his medications.  Denies self injury.  Has not taken any substances today.  Paranoia started within the last week  Past Medical History:  Diagnosis Date  . Marijuana abuse   . Schizophrenia, paranoid ([place])   . Tardive dyskinesia   . Tobacco abuse     Patient Active Problem List   Diagnosis Date Noted  . Substance induced mood disorder ([place]) 10/13/2019  . Aggressive behavior   . Tremor 04/24/2018  . Schizophrenia ([place]) 04/01/2018  . Noncompliance 04/10/2017  . Asthma 07/13/2016  . Schizoaffective disorder, bipolar type ([place]) 06/27/2016  . Antisocial traits 09/15/2015  . Tobacco use disorder 09/14/2015  . Cannabis use disorder, moderate, dependence ([place]) 09/14/2015    Past Surgical History:  Procedure Laterality Date  . NO PAST SURGERIES      Prior to Admission medications   Medication Sig Start Date End Date Taking? Authorizing Provider  benztropine (COGENTIN) 1 MG tablet Take 1 tablet (1 mg total) by mouth 2 (two) times daily. 11/21/19 04/07/20  Patrecia Pour, NP  busPIRone (BUSPAR) 10 MG tablet Take 10 mg by mouth 2 (two) times daily. 03/11/20   [provider]  clonazePAM (KLONOPIN) 0.5 MG tablet Take 0.5 mg by mouth 2 (two) times daily as needed. 04/05/20   [provider]  cyclobenzaprine (FLEXERIL) 5 MG tablet Take 1 tablet (5 mg total) by mouth 3 (three) times daily as needed. 04/11/20   Menshew, Dannielle Karvonen, PA-C  gabapentin (NEURONTIN) 300 MG  capsule Take 2 capsules (600 mg total) by mouth 3 (three) times daily. 11/21/19   Patrecia Pour, NP  haloperidol decanoate (HALDOL DECANOATE) 100 MG/ML injection  03/29/20   [provider]  mirtazapine (REMERON) 15 MG tablet Take 15 mg by mouth at bedtime. 03/23/20   [provider]  QUEtiapine (SEROQUEL) 100 MG tablet Take 100 mg by mouth at bedtime. 03/23/20   [provider]     Allergies Penicillins  No family history on file.  Social History Social History   Tobacco Use  . Smoking status: Current Every Day Smoker    Packs/day: 2.00    Types: Cigarettes  . Smokeless tobacco: Never Used  . Tobacco comment: Unknown   Substance Use Topics  . Alcohol use: Yes    Alcohol/week: 0.0 standard drinks    Comment: Unknown   . Drug use: Yes    Frequency: 7.0 times per week    Types: Marijuana    Comment: Unknown     Review of Systems  Constitutional: No fever/chills Eyes: No visual changes.  ENT: No sore throat. Cardiovascular: Denies chest pain. Respiratory: Denies shortness of breath. Gastrointestinal: No abdominal pain. Genitourinary: Negative for dysuria. Musculoskeletal: Negative for back pain. Skin: Negative for rash. Neurological: Negative for headaches    ____________________________________________   PHYSICAL EXAM:  VITAL SIGNS: ED Triage Vitals [04/11/20 1858]  Enc Vitals Group     BP 116/89     Pulse Rate 98     Resp 16  Temp 98.2 F (36.8 C)     Temp Source Oral     SpO2 98 %     Weight 77 kg (169 lb 12.1 oz)     Height      Head Circumference      Peak Flow      Pain Score 0     Pain Loc      Pain Edu?      Excl. in GC?     Constitutional: Alert and oriented.  Nose: No congestion/rhinnorhea. Mouth/Throat: Mucous membranes are moist.    Cardiovascular: Normal rate, regular rhythm. Grossly normal heart sounds.  Good peripheral circulation. Respiratory: Normal respiratory effort.  No  retractions. Gastrointestinal: Soft and nontender. No distention.  No CVA tenderness. Musculoskeletal: No lower extremity tenderness nor edema.  Warm and well perfused Neurologic:  Normal speech and language. No gross focal neurologic deficits are appreciated.  Skin:  Skin is warm, dry and intact. No rash noted. Psychiatric: Mood and affect are normal. Speech and behavior are normal.  ____________________________________________   LABS (all labs ordered are listed, but only abnormal results are displayed)  Labs Reviewed  COMPREHENSIVE METABOLIC PANEL - Abnormal; Notable for the following components:      Result Value   Glucose, Bld 122 (*)    All other components within normal limits  SALICYLATE LEVEL - Abnormal; Notable for the following components:   Salicylate Lvl <7.0 (*)    All other components within normal limits  ACETAMINOPHEN LEVEL - Abnormal; Notable for the following components:   Acetaminophen (Tylenol), Serum <10 (*)    All other components within normal limits  CBC - Abnormal; Notable for the following components:   Hemoglobin 12.7 (*)    All other components within normal limits  URINE DRUG SCREEN, QUALITATIVE ([facility] ONLY) - Abnormal; Notable for the following components:   Cannabinoid 50 Ng, Ur [place] POSITIVE (*)    All other components within normal limits  RESPIRATORY PANEL BY RT PCR (FLU A&B, COVID)  ETHANOL   ____________________________________________  EKG  None ____________________________________________  RADIOLOGY  None ____________________________________________   PROCEDURES  Procedure(s) performed: No  Procedures   Critical Care performed: No ____________________________________________   INITIAL IMPRESSION / ASSESSMENT AND PLAN / ED COURSE  Pertinent labs & imaging results that were available during my care of the patient were reviewed by me and considered in my medical decision making (see chart for details).  Patient presents  with increasing paranoia, aggressive and threatening behavior.  Threatening to assault his sister.  We will place the patient under involuntary commitment, consult TTS and psychiatry.  The patient is medically cleared for psychiatric consultation    ____________________________________________   FINAL CLINICAL IMPRESSION(S) / ED DIAGNOSES  Final diagnoses:  Schizophrenia, unspecified type (HCC)  Aggressive behavior        Note:  This document was prepared using Dragon voice recognition software and may include unintentional dictation errors.   Jene Every, MD 04/11/20 [phone]

## 2020-04-11 NOTE — ED Triage Notes (Signed)
Pt arrived for triage. Walked down to cafeteria to get drink. At first nurse desk drinking ginger ale without difficulty.  + photophobia, "that's why I squint my eyes". Hx of migraines.  "one time I had a migraine for 6 months.  NAD.

## 2020-04-11 NOTE — ED Notes (Signed)
Pt refused to stay for discharge vitals 

## 2020-04-11 NOTE — ED Notes (Signed)
Pt wanting to go sit in sun. Explained he needs to wait inside so does not miss room. Pt given blanket.

## 2020-04-11 NOTE — ED Notes (Signed)
Patient dressed out by this RN and [place], EDT in hospital provided scrubs. Pt belongings placed in belongings bag and labeled with pt label and green tag. Pt had with them the following belongings:   1 pair of shoes gray and green tennis shoes 1 pair of white and black socks 1 pair of blue jean pants  1 pair of gray boxers 1 black t shirt 1 black and brown belt 1 pack of Time cigarettes  1 pair of black headphones 1 black fanny pack 1 lighter 1 black facemask 1 cell phone with a blue and gray case  Pt belongings taken with patient to quad. Pt calm and cooperative.

## 2020-04-11 NOTE — ED Provider Notes (Signed)
[facility] Emergency Department Provider Note ____________________________________________  Time seen: 1632  I have reviewed the triage vital signs and the nursing notes.  HISTORY  Chief Complaint  Migraine  HPI Peter Wilson is a 44 y.o. male with a history of schizophrenia, schizoaffective of bipolar disorder, and chronic cannabis use, presents for migraine headache pain.  Patient denies any nausea, vomiting, photophobia or phonophobia.   Past Medical History:  Diagnosis Date  . Marijuana abuse   . Schizophrenia, paranoid (HCC)   . Tardive dyskinesia   . Tobacco abuse     Patient Active Problem List   Diagnosis Date Noted  . Substance induced mood disorder (HCC) 10/13/2019  . Aggressive behavior   . Tremor 04/24/2018  . Schizophrenia (HCC) 04/01/2018  . Noncompliance 04/10/2017  . Asthma 07/13/2016  . Schizoaffective disorder, bipolar type (HCC) 06/27/2016  . Antisocial traits 09/15/2015  . Tobacco use disorder 09/14/2015  . Cannabis use disorder, moderate, dependence (HCC) 09/14/2015    Past Surgical History:  Procedure Laterality Date  . NO PAST SURGERIES      Prior to Admission medications   Medication Sig Start Date End Date Taking? Authorizing Provider  benztropine (COGENTIN) 1 MG tablet Take 1 tablet (1 mg total) by mouth 2 (two) times daily. 11/21/19 04/07/20  Charm Rings, NP  busPIRone (BUSPAR) 10 MG tablet Take 10 mg by mouth 2 (two) times daily. 03/11/20   [provider]  clonazePAM (KLONOPIN) 0.5 MG tablet Take 0.5 mg by mouth 2 (two) times daily as needed. 04/05/20   [provider]  cyclobenzaprine (FLEXERIL) 5 MG tablet Take 1 tablet (5 mg total) by mouth 3 (three) times daily as needed. 04/11/20   Syana Degraffenreid, Charlesetta Ivory, PA-C  gabapentin (NEURONTIN) 300 MG capsule Take 2 capsules (600 mg total) by mouth 3 (three) times daily. 11/21/19   Charm Rings, NP  haloperidol decanoate (HALDOL DECANOATE) 100 MG/ML  injection  03/29/20   [provider]  mirtazapine (REMERON) 15 MG tablet Take 15 mg by mouth at bedtime. 03/23/20   [provider]  QUEtiapine (SEROQUEL) 100 MG tablet Take 100 mg by mouth at bedtime. 03/23/20   [provider]    Allergies Penicillins  History reviewed. No pertinent family history.  Social History Social History   Tobacco Use  . Smoking status: Current Every Day Smoker    Packs/day: 2.00    Types: Cigarettes  . Smokeless tobacco: Never Used  . Tobacco comment: Unknown   Substance Use Topics  . Alcohol use: Yes    Alcohol/week: 0.0 standard drinks    Comment: Unknown   . Drug use: Yes    Frequency: 7.0 times per week    Types: Marijuana    Comment: Unknown     Review of Systems  Constitutional: Negative for fever. Eyes: Negative for visual changes. ENT: Negative for sore throat. Cardiovascular: Negative for chest pain. Respiratory: Negative for shortness of breath. Gastrointestinal: Negative for abdominal pain, vomiting and diarrhea. Genitourinary: Positive for dysuria. Musculoskeletal: Negative for back pain. Skin: Negative for rash. Neurological: Positive for headaches. No focal weakness or numbness. ____________________________________________  PHYSICAL EXAM:  VITAL SIGNS: ED Triage Vitals  Enc Vitals Group     BP 04/11/20 1617 103/83     Pulse Rate 04/11/20 1617 98     Resp 04/11/20 1617 18     Temp 04/11/20 1617 97.8 F (36.6 C)     Temp Source 04/11/20 1617 Oral  SpO2 04/11/20 1617 99 %     Weight 04/11/20 1613 170 lb (77.1 kg)     Height 04/11/20 1613 6' (1.829 m)     Head Circumference --      Peak Flow --      Pain Score 04/11/20 1613 9     Pain Loc --      Pain Edu? --      Excl. in [place]? --     Constitutional: Alert and oriented. Well appearing and in no distress. GCS=15 Head: Normocephalic and atraumatic. Eyes: Conjunctivae are normal. Normal extraocular movements and fundi bilaterally.   Cardiovascular: Normal rate, regular rhythm. Normal distal pulses. Respiratory: Normal respiratory effort. No wheezes/rales/rhonchi. Gastrointestinal: Soft and nontender. No distention. Musculoskeletal: Nontender with normal range of motion in all extremities.  Neurologic:  Normal gait without ataxia. Normal speech and language. No gross focal neurologic deficits are appreciated. Skin:  Skin is warm, dry and intact. No rash noted. Psychiatric: Mood and affect are normal. Patient exhibits appropriate insight and judgment. ____________________________________________   LABS (pertinent positives/negatives) Labs Reviewed  URINALYSIS, COMPLETE (UACMP) WITH MICROSCOPIC - Abnormal; Notable for the following components:      Result Value   Color, Urine YELLOW (*)    APPearance CLEAR (*)    Ketones, ur 5 (*)    Protein, ur 30 (*)    Bacteria, UA RARE (*)    All other components within normal limits  ____________________________________________  PROCEDURES  ASA-APAP-Caffeine ii tabs PO  Procedures ____________________________________________  INITIAL IMPRESSION / ASSESSMENT AND PLAN / ED COURSE  Patient with ED evaluation of headache pain and dysuria.  Patient again reports pain related to a remote penile contusion.  His urine is clean for any signs of acute leukocyturia or hematuria.  He is treated with Excedrin Migraine tab for his symptoms.  He is also discharged with a small prescription for Flexeril for ongoing pain relief.  He will follow-up with primary provider for ongoing symptoms or return to the ED as necessary.  Peter Wilson was evaluated in Emergency Department on 04/11/2020 for the symptoms described in the history of present illness. He was evaluated in the context of the global COVID-19 pandemic, which necessitated consideration that the patient might be at risk for infection with the SARS-CoV-2 virus that causes COVID-19. Institutional protocols and algorithms that pertain to  the evaluation of patients at risk for COVID-19 are in a state of rapid change based on information released by regulatory bodies including the CDC and federal and state organizations. These policies and algorithms were followed during the patient's care in the ED. ____________________________________________  FINAL CLINICAL IMPRESSION(S) / ED DIAGNOSES  Final diagnoses:  Bad headache      Epiphany Seltzer, Dannielle Karvonen, PA-C 04/11/20 2111    Drenda Freeze, MD 04/14/20 [phone]
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
## 2020-04-11 NOTE — ED Triage Notes (Signed)
Called unable to find pt.
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
## 2020-04-11 NOTE — ED Notes (Signed)
[facility] [phone] number given to pt at request

## 2020-04-11 NOTE — ED Notes (Signed)
Pt given graham crackers and peanut butter.

## 2020-04-11 NOTE — ED Triage Notes (Signed)
Called for triage no answer  

## 2020-04-11 NOTE — ED Notes (Signed)
Ginger ale given as requested
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
## 2020-04-12 DIAGNOSIS — R4689 Other symptoms and signs involving appearance and behavior: Secondary | ICD-10-CM | POA: Diagnosis not present

## 2020-04-12 NOTE — ED Notes (Signed)
Pt called out for nurse stating that he is ready go home because he is feeling better and needs to go see his caseworker. Advised pt that this would be up to the doctor if he could go home or not and it would be a while before discharge could take place. PT voices understanding but continues to stand at door and look out.
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
## 2020-04-12 NOTE — ED Notes (Signed)
Pt refuses covid swab at this time. I advised pt that we needed to swab him due to him staying in the hospital. Pt reports "I aint staying, I am just waiting on the doctor to come in and adjust my medications".

## 2020-04-12 NOTE — ED Provider Notes (Signed)
-----------------------------------------   11:17 AM on 04/12/2020 -----------------------------------------  Per Dr. Burgess Estelle from psychiatry, the patient has been evaluated and is stable for discharge.  Dr. Burgess Estelle states he has rescinded the IVC.  Return precautions provided.   Dionne Bucy, MD 04/12/20 1118

## 2020-04-12 NOTE — Discharge Instructions (Signed)
Return to the ER for new, worsening, or persistent severe symptoms such as any thoughts of wanting to hurt yourself or anyone else, or any other new or worsening mental or physical symptoms.  Follow-up with your regular psychiatrist.
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
## 2020-04-12 NOTE — ED Notes (Signed)
Pt handed me the phone stating "This is my ACT team". This RN spoke with Almira Coaster with ACT and updated her on pt's status and plan.

## 2020-04-12 NOTE — ED Notes (Signed)
Pt standing at door speaking with Engineer, materials. PT requesting to speak with his psychiatrist stating "I need to get out of here, this is bull shit". Dr. Burgess Estelle informed

## 2020-04-12 NOTE — ED Notes (Signed)
Pt given breakfast tray

## 2020-04-12 NOTE — ED Notes (Signed)
Pt is agitated and this time and requesting to speak to this RN. Pt states he's ready to go home and that he wants to talk to his case worker. This RN explains to the patient that we are waiting for the doctor and social worker to work a few things out. Pt standing at doorway at this time appearing still agitated.

## 2020-04-12 NOTE — ED Notes (Signed)
Psychiatry team at bedside

## 2020-04-12 NOTE — BH Assessment (Signed)
TTS & psych reassessment completed. Pt identified his main complaint to be med management. Pt denies any current SI/HI or hallucinations and was able to contract his safety.  Per Cindy Hazy, MD Pt will be discharged with the recommendation to follow up with his ACT team.

## 2020-05-07 ENCOUNTER — Encounter: Payer: Self-pay | Admitting: Emergency Medicine

## 2020-05-07 ENCOUNTER — Emergency Department: Payer: Medicare Other

## 2020-05-07 ENCOUNTER — Other Ambulatory Visit: Payer: Self-pay

## 2020-05-07 ENCOUNTER — Emergency Department
Admission: EM | Admit: 2020-05-07 | Discharge: 2020-05-08 | Disposition: A | Discharge status: Home / Self Care | Payer: Medicare Other | Attending: Emergency Medicine | Admitting: Emergency Medicine

## 2020-05-07 DIAGNOSIS — F17210 Nicotine dependence, cigarettes, uncomplicated: Secondary | ICD-10-CM | POA: Diagnosis not present

## 2020-05-07 DIAGNOSIS — Z79899 Other long term (current) drug therapy: Secondary | ICD-10-CM | POA: Insufficient documentation

## 2020-05-07 DIAGNOSIS — X58XXXA Exposure to other specified factors, initial encounter: Secondary | ICD-10-CM | POA: Diagnosis not present

## 2020-05-07 DIAGNOSIS — Y9389 Activity, other specified: Secondary | ICD-10-CM | POA: Insufficient documentation

## 2020-05-07 DIAGNOSIS — R4689 Other symptoms and signs involving appearance and behavior: Secondary | ICD-10-CM | POA: Diagnosis present

## 2020-05-07 DIAGNOSIS — Y999 Unspecified external cause status: Secondary | ICD-10-CM | POA: Diagnosis not present

## 2020-05-07 DIAGNOSIS — F250 Schizoaffective disorder, bipolar type: Secondary | ICD-10-CM | POA: Diagnosis not present

## 2020-05-07 DIAGNOSIS — S0990XA Unspecified injury of head, initial encounter: Secondary | ICD-10-CM | POA: Diagnosis not present

## 2020-05-07 DIAGNOSIS — Y929 Unspecified place or not applicable: Secondary | ICD-10-CM | POA: Diagnosis not present

## 2020-05-07 DIAGNOSIS — R519 Headache, unspecified: Secondary | ICD-10-CM | POA: Insufficient documentation

## 2020-05-07 DIAGNOSIS — R45850 Homicidal ideations: Secondary | ICD-10-CM | POA: Insufficient documentation

## 2020-05-07 LAB — COMPREHENSIVE METABOLIC PANEL
ALT: 13 U/L (ref 0–44)
AST: 35 U/L (ref 15–41)
Albumin: 4.7 g/dL (ref 3.5–5.0)
Alkaline Phosphatase: 56 U/L (ref 38–126)
Anion gap: 8 (ref 5–15)
BUN: 9 mg/dL (ref 6–20)
CO2: 26 mmol/L (ref 22–32)
Calcium: 9.5 mg/dL (ref 8.9–10.3)
Chloride: 105 mmol/L (ref 98–111)
Creatinine, Ser: 0.92 mg/dL (ref 0.61–1.24)
GFR calc Af Amer: >60 mL/min (ref >60)
GFR calc non Af Amer: >60 mL/min (ref >60)
Glucose, Bld: 112 mg/dL — ABNORMAL HIGH (ref 70–99)
Potassium: 3.8 mmol/L (ref 3.5–5.1)
Sodium: 139 mmol/L (ref 135–145)
Total Bilirubin: 0.7 mg/dL (ref 0.3–1.2)
Total Protein: 8.6 g/dL — ABNORMAL HIGH (ref 6.5–8.1)

## 2020-05-07 LAB — CBC
HCT: 39.7 % (ref 39.0–52.0)
Hemoglobin: 12.8 g/dL — ABNORMAL LOW (ref 13.0–17.0)
MCH: 29 pg (ref 26.0–34.0)
MCHC: 32.2 g/dL (ref 30.0–36.0)
MCV: 89.8 fL (ref 80.0–100.0)
Platelets: 321 10*3/uL (ref 150–400)
RBC: 4.42 MIL/uL (ref 4.22–5.81)
RDW: 13.5 % (ref 11.5–15.5)
WBC: 5.8 10*3/uL (ref 4.0–10.5)
nRBC: 0 % (ref 0.0–0.2)

## 2020-05-07 LAB — ETHANOL: Alcohol, Ethyl (B): <10 mg/dL (ref <10)

## 2020-05-07 LAB — SALICYLATE LEVEL: Salicylate Lvl: <7 mg/dL — ABNORMAL LOW (ref 7.0–30.0)

## 2020-05-07 LAB — ACETAMINOPHEN LEVEL: Acetaminophen (Tylenol), Serum: <10 ug/mL — ABNORMAL LOW (ref 10–30)

## 2020-05-07 MED ORDER — BUSPIRONE HCL 5 MG PO TABS
10.0000 mg | ORAL_TABLET | Freq: Two times a day (BID) | ORAL | Status: DC
  Administered 2020-05-07: 10 mg via ORAL
  Filled 2020-05-07: qty 2

## 2020-05-07 MED ORDER — QUETIAPINE FUMARATE 25 MG PO TABS
100.0000 mg | ORAL_TABLET | Freq: Every day | ORAL | Status: DC
  Administered 2020-05-07: 100 mg via ORAL
  Filled 2020-05-07: qty 4

## 2020-05-07 MED ORDER — MIRTAZAPINE 15 MG PO TABS
15.0000 mg | ORAL_TABLET | Freq: Every day | ORAL | Status: DC
  Administered 2020-05-07: 15 mg via ORAL
  Filled 2020-05-07: qty 1

## 2020-05-07 MED ORDER — BENZTROPINE MESYLATE 1 MG PO TABS
1.0000 mg | ORAL_TABLET | Freq: Two times a day (BID) | ORAL | Status: DC
  Administered 2020-05-07: 1 mg via ORAL
  Filled 2020-05-07: qty 1

## 2020-05-07 MED ORDER — ACETAMINOPHEN 325 MG PO TABS
650.0000 mg | ORAL_TABLET | Freq: Once | ORAL | Status: AC
  Administered 2020-05-07: 650 mg via ORAL
  Filled 2020-05-07: qty 2

## 2020-05-07 MED ORDER — CLONAZEPAM 0.5 MG PO TABS
0.5000 mg | ORAL_TABLET | Freq: Two times a day (BID) | ORAL | Status: DC | PRN
  Administered 2020-05-07: 0.5 mg via ORAL
  Filled 2020-05-07: qty 1
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
## 2020-05-07 NOTE — Discharge Instructions (Signed)
Take your meds as prescribed   SEe your counselor   Return to ER if you have thoughts of harming yourself or others, hallucinations

## 2020-05-07 NOTE — ED Notes (Signed)
Pt returned to his room.

## 2020-05-07 NOTE — ED Notes (Signed)
Patient changed into hospital scrubs by this RN and Gabby EDT. Patient belongings placed in belonging bag and given to primary RN. Patient has t-shirt, jeans, belt, shorts, underwear, socks, shoes, lighter, phone charger, fanny pack and a twenty dollar bill. Patient's belongings given to primary  RN.
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
## 2020-05-07 NOTE — ED Notes (Signed)
Peter Wilson from [facility] Team stated that can call for an updated medication list. [phone]

## 2020-05-07 NOTE — ED Notes (Signed)
Pt to CT via wheelchair with EDT Allayne Stack and CT Tech.

## 2020-05-08 ENCOUNTER — Ambulatory Visit (HOSPITAL_COMMUNITY)
Admission: RE | Admit: 2020-05-08 | Discharge: 2020-05-08 | Disposition: A | Discharge status: Home / Self Care | Payer: Medicare Other | Attending: Psychiatry | Admitting: Psychiatry

## 2020-05-08 DIAGNOSIS — F1290 Cannabis use, unspecified, uncomplicated: Secondary | ICD-10-CM | POA: Insufficient documentation

## 2020-05-08 DIAGNOSIS — F200 Paranoid schizophrenia: Secondary | ICD-10-CM | POA: Diagnosis not present

## 2020-05-08 NOTE — ED Notes (Addendum)
This RN to room and attempt to tell the patient what the doctor had said and patient started yelling "don't talk to me bitch, get out of my face". This RN was out side of the patient room and over 10 feet from him. ACSD Ray went to the room and told the patient what the doctor had said. Pt asked for his clothes to leave.

## 2020-05-08 NOTE — ED Notes (Signed)
Pt became very upset and called this RN a "fat white bitch"  when he was told that snack time was over and that he did have a snack of sandwich tray and drink. Pt yelling and screaming to see the doctor. Dr Silverio Lay states if pt is not happy with service he can leave.

## 2020-05-08 NOTE — ED Notes (Signed)
Pt given his clothes and dressed and walked out. He would not wait for discharge instructions or vital signs. MD aware and ok with pt leaving.

## 2020-05-08 NOTE — H&P (Addendum)
Behavioral Health Medical Screening Exam  Peter Wilson is an 44 y.o. male.  Total Time spent with patient: 30 minutes  Psychiatric Specialty Exam: Physical Exam  Nursing note and vitals reviewed. Constitutional: He is oriented to person, place, and time. He appears well-developed and well-nourished.  HENT:  Head: Normocephalic.  Respiratory: Effort normal.  Musculoskeletal:        General: Normal range of motion.     Cervical back: Normal range of motion.  Neurological: He is alert and oriented to person, place, and time.  Psychiatric: He has a normal mood and affect. His speech is normal and behavior is normal. Judgment and thought content normal. Cognition and memory are normal.    Review of Systems  All other systems reviewed and are negative.   There were no vitals taken for this visit.There is no height or weight on file to calculate BMI.  General Appearance: Casual  Eye Contact:  Good  Speech:  Normal Rate  Volume:  Normal  Mood:  Euthymic  Affect:  Congruent  Thought Process:  Coherent and Descriptions of Associations: Intact  Orientation:  Full (Time, Place, and Person)  Thought Content:  WDL and Logical  Suicidal Thoughts:  No  Homicidal Thoughts:  No  Memory:  Immediate;   Good Recent;   Good Remote;   Good  Judgement:  Good  Insight:  Good  Psychomotor Activity:  Normal  Concentration: Concentration: Good and Attention Span: Good  Recall:  Good  Fund of Knowledge:Fair  Language: Good  Akathisia:  No  Handed:  Right  AIMS (if indicated):     Assets:  Housing Leisure Time Physical Health Resilience Social Support  Sleep:       Musculoskeletal: Strength & Muscle Tone: within normal limits Gait & Station: normal Patient leans: N/A  Recommendations:  Based on my evaluation the patient does not appear to have an emergency medical condition.  -Follow up with [facility]  Nanine Means, NP 05/08/2020, 2:06 PM Patient seen face-to-face for  psychiatric evaluation, chart reviewed and case discussed with the physician extender and developed treatment plan. Reviewed the information documented and agree with the treatment plan. Thedore Mins, MD

## 2020-05-08 NOTE — BH Assessment (Addendum)
Assessment Note  Peter Wilson is an 44 y.o. male with a noted history of Schizophrenia, paranoid and marijuana abuse. Patient presented to [facility] as a walk-in. He reports having homicidal thoughts toward his 69 yrs old nephew, yesterday. States, "My nephew wants to smoke weed, have sex with women, and talk junk to me". Patient stating that his nephew is disrespectful and doesn't have respect for adults. Patient had a plan to cut his nephew yesterday. He has access to means: switch blade.  Upon assessing patient later in the day. He states that he no longer has thoughts to harm himself. He had the thoughts yesterday but feels much better. States, "Get some sleep here has really helped me feel better, now I'm ready to go". Patient states that he is able to contract for safety against harming others and his nephew. He states that he will stay away from his nephew because they do not get along. Patient denies a history of aggressive and assaultive behaviors. No legal issues. Patient is not on probation and has not court dates.   Patient denies SI. He reports a history of suicidal attempts and gestures such as cutting his wrist (20 plus attempts). He reports multiple hospitalizations in the state of [place]. States, "I have been to every hospital between [place] and [place]. Patient states that the triggers for his past suicide attempts and hospitalizations are due to "everything". He was not able to provide any specific examples. Patient reports current depressive symptoms: Feeling angry/irritable, Feeling worthless/self pity, Loss of interest in usual pleasures, Fatigue, Isolating, and Tearfulness. Patient with moderate anxiety "on and off". No current symptoms of anxiety.   Patient denies current AVH's. However, reports hearing voices yesterday telling him to "Kill the bastard". No visual hallucinations reported. He does not appear to be responding to internal stimuli.   Patient denies a history of substance  use. However, their is noted history of THC use.   Patient is oriented to time, person, place, and situation. Mood is pleasant. Affect is appropriate. Speech is normal. Insight and judgement is fair. Impulse control is fair.   Diagnosis: Schizophrenia, paranoid and Marijuana Use   Past Medical History:  Past Medical History:  Diagnosis Date  . Marijuana abuse   . Schizophrenia, paranoid ([place])   . Tardive dyskinesia   . Tobacco abuse     Past Surgical History:  Procedure Laterality Date  . NO PAST SURGERIES      Family History: No family history on file.  Social History:  reports that he has been smoking cigarettes. He has been smoking about 2.00 packs per day. He has never used smokeless tobacco. He reports current alcohol use. He reports current drug use. Frequency: 7.00 times per week. Drug: Marijuana.  Additional Social History:  Alcohol / Drug Use Pain Medications: see mar Prescriptions: see mar Over the Counter: see mar History of alcohol / drug use?: (Patient denies substance use history. However, patient has a noted history of THC use.) Longest period of sobriety (when/how long): None Reported  CIWA:   COWS:    Allergies:  Allergies  Allergen Reactions  . Penicillins Rash    Did it involve swelling of the face/tongue/throat, SOB, or low BP? U Did it involve sudden or severe rash/hives, skin peeling, or any reaction on the inside of your mouth or nose? Y  Did you need to seek medical attention at a hospital or doctor's office? Y When did it last happen?childhood If all above answers are "NO", may  proceed with cephalosporin use.     Home Medications: (Not in a hospital admission)   OB/GYN Status:  No LMP for male patient.  General Assessment Data TTS Assessment: In system Is this a Tele or Face-to-Face Assessment?: Tele Assessment Is this an Initial Assessment or a Re-assessment for this encounter?: Initial Assessment Patient Accompanied by::  (provider-[facility]) Language Other than English: No Living Arrangements: Homeless/Shelter What gender do you identify as?: Male Marital status: Single Maiden name: (n/a) Pregnancy Status: No Living Arrangements: (currently homeless ) Can pt return to current living arrangement?: No Admission Status: Voluntary Is patient capable of signing voluntary admission?: Yes Referral Source: Self/Family/Friend Insurance type: (Medicaid and Medicare )     Crisis Care Plan Living Arrangements: (currently homeless ) Legal Guardian: (no legal guardian ) Name of Psychiatrist: (Dr. Farrah/[facility] ) Name of Therapist: (no therapist )  Education Status Is patient currently in school?: No Is the patient employed, unemployed or receiving disability?: Receiving disability income  Risk to self with the past 6 months Suicidal Ideation: No Has patient been a risk to self within the past 6 months prior to admission? : No Suicidal Intent: No Has patient had any suicidal intent within the past 6 months prior to admission? : No Is patient at risk for suicide?: No Suicidal Plan?: No Has patient had any suicidal plan within the past 6 months prior to admission? : No Access to Means: Yes Specify Access to Suicidal Means: (switch blade) What has been your use of drugs/alcohol within the last 12 months?: (no history ) Previous Attempts/Gestures: Yes How many times?: (20x's plus) Other Self Harm Risks: (yes; hx of burning ) Triggers for Past Attempts: ("It's hard to explain, everything") Intentional Self Injurious Behavior: Burning Comment - Self Injurious Behavior: (hx of burning ) Family Suicide History: No(no family history ) Recent stressful life event(s): ("I want a place to stay") Persecutory voices/beliefs?: No Depression: Yes Depression Symptoms: Feeling angry/irritable, Feeling worthless/self pity, Loss of interest in usual pleasures, Fatigue, Isolating,  Tearfulness Substance abuse history and/or treatment for substance abuse?: No Suicide prevention information given to non-admitted patients: Not applicable  Risk to Others within the past 6 months Homicidal Ideation: No-Not Currently/Within Last 6 Months Thoughts of Harm to Others: No-Not Currently Present/Within Last 6 Months Comment - Thoughts of Harm to Others: (patient had thoughts to harm nephew (3 yrs old) yesterday ) Current Homicidal Intent: No-Not Currently/Within Last 6 Months Current Homicidal Plan: No-Not Currently/Within Last 6 Months(yesterday had a plan to cut him; no longer has thoughts) Access to Homicidal Means: No Identified Victim: (n/a) History of harm to others?: No Assessment of Violence: None Noted Violent Behavior Description: (patient is currently calm and cooperative ) Does patient have access to weapons?: Yes (Comment)(switch blade) Criminal Charges Pending?: No Does patient have a court date: No Is patient on probation?: No  Psychosis Hallucinations: Auditory(none today; yesteday aud halluc.'s- "kill that bastard") Delusions: None noted  Mental Status Report Appearance/Hygiene: Unremarkable Eye Contact: Good Motor Activity: Freedom of movement Speech: Logical/coherent Level of Consciousness: Alert Mood: Depressed Affect: Appropriate to circumstance Anxiety Level: None Thought Processes: Coherent, Relevant Judgement: Impaired Orientation: Person, Place, Time, Situation Obsessive Compulsive Thoughts/Behaviors: None  Cognitive Functioning Concentration: Normal Memory: Recent Intact, Remote Intact Is patient IDD: No Insight: Fair Impulse Control: Fair Appetite: Fair Have you had any weight changes? : No Change Sleep: Decreased Total Hours of Sleep: (varies; 6-8 hrs) Vegetative Symptoms: None  ADLScreening [facility] Assessment Services) Patient's cognitive ability adequate  to safely complete daily activities?: Yes Patient able to express need  for assistance with ADLs?: Yes Independently performs ADLs?: Yes (appropriate for developmental age)  Prior Inpatient Therapy Prior Inpatient Therapy: No  Prior Outpatient Therapy Prior Outpatient Therapy: No Does patient have an ACCT team?: Yes([facility] ) Does patient have Intensive In-House Services?  : No Does patient have [facility] services? : No Does patient have P4CC services?: No  ADL Screening (condition at time of admission) Patient's cognitive ability adequate to safely complete daily activities?: Yes Is the patient deaf or have difficulty hearing?: No Does the patient have difficulty seeing, even when wearing glasses/contacts?: No Does the patient have difficulty concentrating, remembering, or making decisions?: No Patient able to express need for assistance with ADLs?: Yes Does the patient have difficulty dressing or bathing?: No Independently performs ADLs?: Yes (appropriate for developmental age) Does the patient have difficulty walking or climbing stairs?: No Weakness of Legs: None Weakness of Arms/Hands: None  Home Assistive Devices/Equipment Home Assistive Devices/Equipment: None    Abuse/Neglect Assessment (Assessment to be complete while patient is alone) Physical Abuse: Denies Verbal Abuse: Denies Sexual Abuse: Denies Exploitation of patient/patient's resources: Denies Self-Neglect: Denies     Merchant navy officer (For Healthcare) Does Patient Have a Medical Advance Directive?: No Nutrition Screen- [facility] Adult/WL/AP Patient's home diet: Regular Has the patient recently lost weight without trying?: No Has the patient been eating poorly because of a decreased appetite?: No Malnutrition Screening Tool Score: 0        Disposition: Per Nanine Means, DNP, patient ok to discharge. He is psych cleared. Patient's ACTT provider ([facility]) contacted and notified of patient's discharge. The ACTT provider will not provide transportation.  They recommend that patient rides the bus for transportation and indicated that patient has funds. Patient given a bus pass and discharged from [facility]. Patient to follow up with his ACTT provider for his psychiatric needs/services. Disposition Initial Assessment Completed for this Encounter: Yes Disposition of Patient: Discharge(Per Nanine Means, NP pt. is psych cleared ) Mode of transportation if patient is discharged/movement?: Bus Patient referred to: Outpatient clinic referral(current ACTT provider-[facility])  On Site Evaluation by:   Reviewed with Physician:    Melynda Ripple 05/08/2020 2:38 PM

## 2020-05-12 ENCOUNTER — Emergency Department (HOSPITAL_COMMUNITY)
Admission: EM | Admit: 2020-05-12 | Discharge: 2020-05-12 | Discharge status: Against Medical Advice | Payer: Medicare Other

## 2020-05-20 ENCOUNTER — Ambulatory Visit (HOSPITAL_COMMUNITY)
Admission: AD | Admit: 2020-05-20 | Discharge: 2020-05-20 | Disposition: A | Discharge status: Home / Self Care | Payer: Medicare Other | Attending: Psychiatry | Admitting: Psychiatry

## 2020-05-20 DIAGNOSIS — Z8659 Personal history of other mental and behavioral disorders: Secondary | ICD-10-CM | POA: Diagnosis not present

## 2020-05-20 DIAGNOSIS — R45850 Homicidal ideations: Secondary | ICD-10-CM | POA: Diagnosis not present

## 2020-05-20 DIAGNOSIS — F200 Paranoid schizophrenia: Secondary | ICD-10-CM | POA: Diagnosis not present

## 2020-05-20 DIAGNOSIS — F1290 Cannabis use, unspecified, uncomplicated: Secondary | ICD-10-CM | POA: Diagnosis not present

## 2020-05-20 DIAGNOSIS — Z590 Homelessness: Secondary | ICD-10-CM | POA: Diagnosis not present

## 2020-05-20 DIAGNOSIS — R440 Auditory hallucinations: Secondary | ICD-10-CM | POA: Diagnosis present
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
## 2020-05-20 NOTE — H&P (Signed)
Behavioral Health Medical Screening Exam  Peter Wilson is an 44 y.o. male.who presented to [facility] as a walk-in, voluntarily. Per chart review, patient has a long history of chronic   Schizophrenia. He has presented to the ED's numerous time for aggressive behaviors. Her has had several admissions to [facility]. Today, he states he came to the hospital because he was having thoughts of hurting others. There is no one specific. He stated," I will hurt anyone who wants to hurt me." He endorsed hearing voices and described  the voices as," they are just saying stuff." He denied hearing voices telling him to harm himself or others. He stated that he was feeling angry because he feel as though he is being mistreated by," everybody." He added that he has no place to live and has been staying," here, there, and everywhere." Reported that he has not had any of his psychotrophic medications in two weeks. Reported having an ACT team through [facility]. Reported marijuana use but denied other substance abuse or use. When asked if he has had any suicide attempts in the past he replied," Yes" but when asked when and how he replied," I don't know." He denied access to firearms.  He denied other psychiatric concerns at this time.   Per review of chart, patient was evaluated at [facility] 05/08/2020 where he presented to [facility] as a walk-in. He reports having homicidal thoughts toward his 103 yrs old nephew. Later that day, he recanted having thoughts of wanting to harm himself and others, stated he felt better after he rested, and he was psychiatrically cleared for discharge.    Total Time spent with patient: 20 minutes  Psychiatric Specialty Exam: Physical Exam  Vitals reviewed. Constitutional: He is oriented to person, place, and time.  Neurological: He is alert and oriented to person, place, and time.    Review of Systems  Psychiatric/Behavioral: Positive for hallucinations.    Blood pressure 105/60, pulse 94, temperature 97.9 F  (36.6 C), temperature source Oral, resp. rate 18, SpO2 100 %.There is no height or weight on file to calculate BMI.  General Appearance: Fairly Groomed  Eye Contact:  Fair  Speech:  Clear and Coherent and Normal Rate  Volume:  Normal  Mood:  Anxious  Affect:  Appropriate  Thought Process:  Coherent, Linear and Descriptions of Associations: Intact  Orientation:  Full (Time, Place, and Person)  Thought Content:  Logical  Suicidal Thoughts:  No  Homicidal Thoughts:  Yes.  without intent/plan  Memory:  Immediate;   Fair Recent;   Fair  Judgement:  Impaired  Insight:  Shallow  Psychomotor Activity:  Normal  Concentration: Concentration: Fair and Attention Span: Fair  Recall:  [employer] of Knowledge:Fair  Language: Good  Akathisia:  Negative  Handed:  Right  AIMS (if indicated):     Assets:  Communication Skills Desire for Improvement Social Support  Sleep:       Musculoskeletal: Strength & Muscle Tone: within normal limits Gait & Station: normal Patient leans: N/A  Blood pressure 105/60, pulse 94, temperature 97.9 F (36.6 C), temperature source Oral, resp. rate 18, SpO2 100 %.  Recommendations:  Based on my evaluation the patient does not appear to have an emergency medical condition.   Patient denied SI. He endorsed HI with no intended target.He endorsed auditory hallucinations but could not described what the voices were saying. He did not appear internally preoccupied. He presented to [facility] 05/08/2020 with a similar presentation. He stated that he has no  stable place to live and this has also been documented in his history. TTS counselor spoke to patients ACT team who stated that these are normal behaviors for patient and that he does receive his medications. The ACTT provider stated that they would not provide transportation. I will ask CSW if patient can have a bus pass. Resources were given to patient for shelters. He was psychiatrically cleared to be discharge with ongoing  follow-up and mental health services with his ACTT.  Denzil Magnuson, NP 05/20/2020, 2:51 PM

## 2020-05-30 ENCOUNTER — Other Ambulatory Visit: Payer: Self-pay

## 2020-05-30 ENCOUNTER — Observation Stay (HOSPITAL_COMMUNITY)
Admission: AD | Admit: 2020-05-30 | Discharge: 2020-05-31 | Disposition: A | Discharge status: Home / Self Care | Payer: Medicare Other | Attending: Psychiatry | Admitting: Psychiatry

## 2020-05-30 ENCOUNTER — Encounter (HOSPITAL_COMMUNITY): Payer: Self-pay | Admitting: Nurse Practitioner

## 2020-05-30 DIAGNOSIS — F17200 Nicotine dependence, unspecified, uncomplicated: Secondary | ICD-10-CM | POA: Insufficient documentation

## 2020-05-30 DIAGNOSIS — F200 Paranoid schizophrenia: Principal | ICD-10-CM | POA: Insufficient documentation

## 2020-05-30 DIAGNOSIS — Z79899 Other long term (current) drug therapy: Secondary | ICD-10-CM | POA: Insufficient documentation

## 2020-05-30 DIAGNOSIS — R45850 Homicidal ideations: Secondary | ICD-10-CM | POA: Insufficient documentation

## 2020-05-30 DIAGNOSIS — Z20822 Contact with and (suspected) exposure to covid-19: Secondary | ICD-10-CM | POA: Diagnosis not present

## 2020-05-30 DIAGNOSIS — F209 Schizophrenia, unspecified: Secondary | ICD-10-CM | POA: Diagnosis present

## 2020-05-30 LAB — SARS CORONAVIRUS 2 BY RT PCR (HOSPITAL ORDER, PERFORMED IN ~~LOC~~ HOSPITAL LAB): SARS Coronavirus 2: NEGATIVE

## 2020-05-30 MED ORDER — ALUM & MAG HYDROXIDE-SIMETH 200-200-20 MG/5ML PO SUSP: 30.0000 mL | ORAL | Status: DC | PRN

## 2020-05-30 MED ORDER — ACETAMINOPHEN 325 MG PO TABS
650.0000 mg | ORAL_TABLET | Freq: Four times a day (QID) | ORAL | Status: DC | PRN
  Administered 2020-05-30: 650 mg via ORAL
  Filled 2020-05-30: qty 2

## 2020-05-30 MED ORDER — MAGNESIUM HYDROXIDE 400 MG/5ML PO SUSP: 30.0000 mL | Freq: Every day | ORAL | Status: DC | PRN

## 2020-05-30 MED ORDER — TEMAZEPAM 15 MG PO CAPS
30.0000 mg | ORAL_CAPSULE | Freq: Once | ORAL | Status: AC
  Administered 2020-05-30: 30 mg via ORAL
  Filled 2020-05-30: qty 2

## 2020-05-30 MED ORDER — RISPERIDONE 3 MG PO TABS
3.0000 mg | ORAL_TABLET | Freq: Every day | ORAL | Status: DC
  Administered 2020-05-30: 3 mg via ORAL
  Filled 2020-05-30: qty 1
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
## 2020-05-30 NOTE — Plan of Care (Signed)
[facility] Observation Crisis Plan  Reason for Crisis Plan:  Crisis Stabilization   Plan of Care:  Referral for Inpatient Hospitalization  Family Support:      Current Living Environment:  Living Arrangements: Other relatives  Insurance:   Hospital Account    Name Acct ID Class Status Primary Coverage   Peter Wilson, Peter Wilson [id number] BEHAVIORAL HEALTH OBSERVATION Open MEDICARE - MEDICARE PART A AND B        Guarantor Account (for Hospital Account [id number])    Name Relation to Pt Service Area Active? Acct Type   [name] [facility] Yes [facility]   Address Phone       [address] [place], [place] [address] [phone](H)          Coverage Information (for Hospital Account [id number])    F/O Payor/Plan Precert #   MEDICARE/MEDICARE PART A AND B    Subscriber Subscriber #   Peter Wilson, Peter Wilson [id number]   Address Phone   [address] [place], [place] [address]       Legal Guardian:  Legal Guardian: Other:(Self)  Primary Care Provider:  Patient, No Pcp Per  Current Outpatient Providers:  Dr Malvin Johns  Psychiatrist:  Name of Psychiatrist: Dr. Malvin Johns  Counselor/Therapist:  Name of Therapist: [facility]  Compliant with Medications:  Yes  Additional Information:   [name] 6/6/202110:54 PM

## 2020-05-31 ENCOUNTER — Emergency Department
Admission: EM | Admit: 2020-05-31 | Discharge: 2020-06-02 | Disposition: A | Discharge status: Home / Self Care | Payer: Medicare Other | Attending: Student | Admitting: Student

## 2020-05-31 ENCOUNTER — Other Ambulatory Visit: Payer: Self-pay

## 2020-05-31 ENCOUNTER — Encounter: Payer: Self-pay | Admitting: *Deleted

## 2020-05-31 DIAGNOSIS — F200 Paranoid schizophrenia: Secondary | ICD-10-CM

## 2020-05-31 DIAGNOSIS — J45909 Unspecified asthma, uncomplicated: Secondary | ICD-10-CM | POA: Diagnosis not present

## 2020-05-31 DIAGNOSIS — R443 Hallucinations, unspecified: Secondary | ICD-10-CM

## 2020-05-31 DIAGNOSIS — R45850 Homicidal ideations: Secondary | ICD-10-CM

## 2020-05-31 DIAGNOSIS — Z79899 Other long term (current) drug therapy: Secondary | ICD-10-CM | POA: Insufficient documentation

## 2020-05-31 DIAGNOSIS — R440 Auditory hallucinations: Secondary | ICD-10-CM | POA: Diagnosis present

## 2020-05-31 DIAGNOSIS — F17210 Nicotine dependence, cigarettes, uncomplicated: Secondary | ICD-10-CM | POA: Diagnosis not present

## 2020-05-31 LAB — COMPREHENSIVE METABOLIC PANEL
ALT: 10 U/L (ref 0–44)
ALT: 12 U/L (ref 0–44)
AST: 28 U/L (ref 15–41)
AST: 31 U/L (ref 15–41)
Albumin: 3.9 g/dL (ref 3.5–5.0)
Albumin: 4.2 g/dL (ref 3.5–5.0)
Alkaline Phosphatase: 55 U/L (ref 38–126)
Alkaline Phosphatase: 59 U/L (ref 38–126)
Anion gap: 7 (ref 5–15)
Anion gap: 10 (ref 5–15)
BUN: 14 mg/dL (ref 6–20)
BUN: 11 mg/dL (ref 6–20)
CO2: 28 mmol/L (ref 22–32)
CO2: 24 mmol/L (ref 22–32)
Calcium: 9.2 mg/dL (ref 8.9–10.3)
Calcium: 9.2 mg/dL (ref 8.9–10.3)
Chloride: 106 mmol/L (ref 98–111)
Chloride: 105 mmol/L (ref 98–111)
Creatinine, Ser: 0.82 mg/dL (ref 0.61–1.24)
Creatinine, Ser: 0.88 mg/dL (ref 0.61–1.24)
GFR calc Af Amer: >60 mL/min (ref >60)
GFR calc Af Amer: >60 mL/min (ref >60)
GFR calc non Af Amer: >60 mL/min (ref >60)
GFR calc non Af Amer: >60 mL/min (ref >60)
Glucose, Bld: 93 mg/dL (ref 70–99)
Glucose, Bld: 118 mg/dL — ABNORMAL HIGH (ref 70–99)
Potassium: 4 mmol/L (ref 3.5–5.1)
Potassium: 3.9 mmol/L (ref 3.5–5.1)
Sodium: 141 mmol/L (ref 135–145)
Sodium: 139 mmol/L (ref 135–145)
Total Bilirubin: 0.5 mg/dL (ref 0.3–1.2)
Total Bilirubin: 0.7 mg/dL (ref 0.3–1.2)
Total Protein: 7.2 g/dL (ref 6.5–8.1)
Total Protein: 7.8 g/dL (ref 6.5–8.1)

## 2020-05-31 LAB — CBC
HCT: 39.9 % (ref 39.0–52.0)
HCT: 40.1 % (ref 39.0–52.0)
Hemoglobin: 12 g/dL — ABNORMAL LOW (ref 13.0–17.0)
Hemoglobin: 12.5 g/dL — ABNORMAL LOW (ref 13.0–17.0)
MCH: 28.5 pg (ref 26.0–34.0)
MCH: 28.4 pg (ref 26.0–34.0)
MCHC: 30.1 g/dL (ref 30.0–36.0)
MCHC: 31.2 g/dL (ref 30.0–36.0)
MCV: 94.8 fL (ref 80.0–100.0)
MCV: 91.1 fL (ref 80.0–100.0)
Platelets: 278 10*3/uL (ref 150–400)
Platelets: 290 10*3/uL (ref 150–400)
RBC: 4.21 MIL/uL — ABNORMAL LOW (ref 4.22–5.81)
RBC: 4.4 MIL/uL (ref 4.22–5.81)
RDW: 14 % (ref 11.5–15.5)
RDW: 13.8 % (ref 11.5–15.5)
WBC: 7.7 10*3/uL (ref 4.0–10.5)
WBC: 5.6 10*3/uL (ref 4.0–10.5)
nRBC: 0 % (ref 0.0–0.2)
nRBC: 0 % (ref 0.0–0.2)

## 2020-05-31 LAB — URINE DRUG SCREEN, QUALITATIVE (ARMC ONLY)
Amphetamines, Ur Screen: NOT DETECTED
Barbiturates, Ur Screen: NOT DETECTED
Benzodiazepine, Ur Scrn: POSITIVE — AB
Cannabinoid 50 Ng, Ur ~~LOC~~: POSITIVE — AB
Cocaine Metabolite,Ur ~~LOC~~: NOT DETECTED
MDMA (Ecstasy)Ur Screen: NOT DETECTED
Methadone Scn, Ur: NOT DETECTED
Opiate, Ur Screen: NOT DETECTED
Phencyclidine (PCP) Ur S: NOT DETECTED
Tricyclic, Ur Screen: NOT DETECTED

## 2020-05-31 LAB — ETHANOL: Alcohol, Ethyl (B): <10 mg/dL (ref <10)

## 2020-05-31 LAB — RAPID URINE DRUG SCREEN, HOSP PERFORMED
Amphetamines: NOT DETECTED
Barbiturates: NOT DETECTED
Benzodiazepines: NOT DETECTED
Cocaine: POSITIVE — AB
Opiates: NOT DETECTED
Tetrahydrocannabinol: POSITIVE — AB

## 2020-05-31 LAB — SALICYLATE LEVEL: Salicylate Lvl: <7 mg/dL — ABNORMAL LOW (ref 7.0–30.0)

## 2020-05-31 LAB — ACETAMINOPHEN LEVEL: Acetaminophen (Tylenol), Serum: <10 ug/mL — ABNORMAL LOW (ref 10–30)

## 2020-05-31 MED ORDER — RISPERIDONE 3 MG PO TBDP: 3.0000 mg | ORAL_TABLET | ORAL | Status: DC

## 2020-05-31 MED ORDER — OLANZAPINE 5 MG PO TABS
5.0000 mg | ORAL_TABLET | Freq: Once | ORAL | Status: AC
  Administered 2020-05-31: 5 mg via ORAL
  Filled 2020-05-31: qty 1

## 2020-05-31 MED ORDER — ZIPRASIDONE MESYLATE 20 MG IM SOLR
20.0000 mg | Freq: Once | INTRAMUSCULAR | Status: AC
  Administered 2020-05-31: 20 mg via INTRAMUSCULAR

## 2020-05-31 MED ORDER — ZIPRASIDONE MESYLATE 20 MG IM SOLR
INTRAMUSCULAR | Status: AC
  Filled 2020-05-31: qty 20

## 2020-05-31 MED ORDER — CLONAZEPAM 0.5 MG PO TABS
0.5000 mg | ORAL_TABLET | Freq: Once | ORAL | Status: AC
  Administered 2020-05-31: 0.5 mg via ORAL
  Filled 2020-05-31: qty 1

## 2020-05-31 NOTE — Progress Notes (Signed)
Patient A&O x 4, ambulatory. Patient discharged in no acute distress. Patient denied SI/HI, A/VH upon discharge. Patient verbalized understanding of all discharge instructions explained by staff, to include follow up with [facility], medications and safety plan. Patient reported mood 10/10.  Pt belongings returned to patient from locker #8 intact and belonging sheet signed. Patient discharged to lobby with transport via his ACCT representative. Goals met, safety maintained.

## 2020-05-31 NOTE — ED Triage Notes (Addendum)
Pt brought in by the act team.  Pt reports HI.  Denies SI.  No etoh use today.  Drug use last night.  Pt calm and cooperative.   [facility] and [facility] today, pt is out of meds per pt.
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
## 2020-05-31 NOTE — BH Assessment (Signed)
Writer assessed patient and he denied SI/HI/VH. However, he endorsed AH at times, reporting "I hear people laughing at me in my head". When asked why the ACT team brought him here even though he was seen at [facility] earlier in the day, patient reported "They dont care". Patient reported he doesn't need to be here. Patient refused to participate in the remaining of the assessesment.    This case was staffed with Annice Pih, NP and it is appropriate for patient to be reassessed in the morning.

## 2020-05-31 NOTE — ED Notes (Signed)
Red polo jeans Black belt Black fanny pack (condoms inside) .90 cents in pocket  grey boxers Blue hospital socks Brown boots  lw edt  Belonging bag given to quad RN

## 2020-05-31 NOTE — Discharge Summary (Signed)
Physician Discharge Summary Note  Patient:  Peter Wilson is an 44 y.o., male MRN:  [id number] DOB:  1976-12-23 Patient phone:  [phone] (home)  Patient address:   [place] [address],  Total Time spent with patient: 30 minutes  Date of Admission:  05/30/2020 Date of Discharge: 05/31/2020  Reason for Admission: Homicidal ideation  Principal Problem: Schizophrenia [facility]) Discharge Diagnoses: Principal Problem:   Schizophrenia ([place])   Past Psychiatric History: See admission H&P  Past Medical History:  Past Medical History:  Diagnosis Date   Marijuana abuse    Schizophrenia, paranoid ([place])    Tardive dyskinesia    Tobacco abuse     Past Surgical History:  Procedure Laterality Date   NO PAST SURGERIES     Family History: History reviewed. No pertinent family history. Family Psychiatric  History: See admission H&P Social History:  Social History   Substance and Sexual Activity  Alcohol Use Yes   Alcohol/week: 0.0 standard drinks   Comment: Unknown      Social History   Substance and Sexual Activity  Drug Use Yes   Frequency: 7.0 times per week   Types: Marijuana   Comment: Unknown     Social History   Socioeconomic History   Marital status: Single    Spouse name: Not on file   Number of children: Not on file   Years of education: Not on file   Highest education level: Not on file  Occupational History   Occupation: Disability  Tobacco Use   Smoking status: Current Every Day Smoker    Packs/day: 2.00    Types: Cigarettes   Smokeless tobacco: Never Used   Tobacco comment: Unknown   Substance and Sexual Activity   Alcohol use: Yes    Alcohol/week: 0.0 standard drinks    Comment: Unknown    Drug use: Yes    Frequency: 7.0 times per week    Types: Marijuana    Comment: Unknown    Sexual activity: Not on file  Other Topics Concern   Not on file  Social History Narrative   Pt discharged from [facility] on 10/10/2019,  was assigned to a hotel in [place] by his [place] provider[facility] [place], [place].     Social Determinants of Health   Financial Resource Strain:    Difficulty of Paying Living Expenses:   Food Insecurity:    Worried About Charity fundraiser in the Last Year:    Arboriculturist in the Last Year:   Transportation Needs:    Film/video editor (Medical):    Lack of Transportation (Non-Medical):   Physical Activity:    Days of Exercise per Week:    Minutes of Exercise per Session:   Stress:    Feeling of Stress :   Social Connections:    Frequency of Communication with Friends and Family:    Frequency of Social Gatherings with Friends and Family:    Attends Religious Services:    Active Member of Clubs or Organizations:    Attends Archivist Meetings:    Marital Status:     Hospital Course:  Patient is seen and examined.  Patient is a 44 year old male with a past psychiatric history significant for schizophrenia.  The patient has had multiple visits and admissions to our facility in the past.  He has presented for numerous times for aggressive behavior.  He came to the hospital having thoughts of harming others.  There was no one  specific.  He also endorsed auditory hallucinations.  He admitted to marijuana use.  He was placed in the observation unit last night.  This morning he denies any auditory or visual hallucinations.  He denied any suicidal or homicidal ideation.  He is followed by an act team.  Review of his admission laboratories revealed normal electrolytes including liver function enzymes.  Normal CBC.  His acetaminophen was less than 10, salicylate was less than 7.  Blood alcohol was less than 10.  Drug screen was positive for cocaine and marijuana.He denies suicidal or homicidal ideation right now.  He needs to quit using marijuana cocaine.  His medications are fine when he does not abuse substances.  We will contact his ACTT  service today, and have him picked up for outpatient psychiatric follow-up.  No change in his medications.  Physical Findings: AIMS:  , ,  ,  ,    CIWA:  CIWA-Ar Total: 0 COWS:     Musculoskeletal: Strength & Muscle Tone: within normal limits Gait & Station: normal Patient leans: N/A  Psychiatric Specialty Exam: Physical Exam  Nursing note and vitals reviewed. Constitutional: He is oriented to person, place, and time. He appears well-developed and well-nourished.  HENT:  Head: Normocephalic and atraumatic.  Respiratory: Effort normal.  Neurological: He is alert and oriented to person, place, and time.    Review of Systems  Blood pressure 112/73, pulse 70, temperature 98.4 F (36.9 C), temperature source Oral, resp. rate 18, height 6\' 1"  (1.854 m), weight 79.4 kg, SpO2 98 %.Body mass index is 23.09 kg/m.  General Appearance: Disheveled  Eye Contact:  Fair  Speech:  Normal Rate  Volume:  Decreased  Mood:  Dysphoric  Affect:  Flat  Thought Process:  Goal Directed and Descriptions of Associations: Circumstantial  Orientation:  Full (Time, Place, and Person)  Thought Content:  Delusions  Suicidal Thoughts:  No  Homicidal Thoughts:  No  Memory:  Immediate;   Fair Recent;   Fair Remote;   Fair  Judgement:  Intact  Insight:  Lacking  Psychomotor Activity:  Normal  Concentration:  Concentration: Fair and Attention Span: Fair  Recall:  of Knowledge:  Fair  Language:  Fair  Akathisia:  Negative  Handed:  Right  AIMS (if indicated):     Assets:  Desire for Improvement Resilience  ADL's:  Intact  Cognition:  WNL  Sleep:        Have you used any form of tobacco in the last 30 days? (Cigarettes, Smokeless Tobacco, Cigars, and/or Pipes): Yes  Has this patient used any form of tobacco in the last 30 days? (Cigarettes, Smokeless Tobacco, Cigars, and/or Pipes) Yes, No  Blood Alcohol level:  Lab Results  Component Value Date   ETH <10 05/07/2020   ETH <10  04/11/2020    Metabolic Disorder Labs:  Lab Results  Component Value Date   HGBA1C 4.6 (L) 04/02/2018   MPG 85.32 04/02/2018   MPG 91 04/11/2017   Lab Results  Component Value Date   PROLACTIN 28.0 (H) 11/06/2019   PROLACTIN 26.4 (H) 04/11/2017   Lab Results  Component Value Date   CHOL 202 (H) 04/02/2018   TRIG 102 04/02/2018   HDL 46 04/02/2018   CHOLHDL 4.4 04/02/2018   VLDL 20 04/02/2018   LDLCALC 136 (H) 04/02/2018   LDLCALC 167 (H) 04/11/2017    See Psychiatric Specialty Exam and Suicide Risk Assessment completed by Attending Physician prior to discharge.  Discharge destination:  Home  Is patient on multiple antipsychotic therapies at discharge:  No   Has Patient had three or more failed trials of antipsychotic monotherapy by history:  No  Recommended Plan for Multiple Antipsychotic Therapies: NA  Discharge Instructions    Increase activity slowly   Complete by: As directed    Increase activity slowly   Complete by: As directed      Allergies as of 05/31/2020      Reactions   Penicillins Rash   Did it involve swelling of the face/tongue/throat, SOB, or low BP? U Did it involve sudden or severe rash/hives, skin peeling, or any reaction on the inside of your mouth or nose? Y  Did you need to seek medical attention at a hospital or doctor's office? Y When did it last happen?childhood If all above answers are NO, may proceed with cephalosporin use.      Medication List    STOP taking these medications   cyclobenzaprine 5 MG tablet Commonly known as: FLEXERIL     TAKE these medications     Indication  benztropine 1 MG tablet Commonly known as: COGENTIN Take 1 tablet (1 mg total) by mouth 2 (two) times daily.  Indication: Extrapyramidal Reaction caused by Medications   carbamazepine 200 MG 12 hr tablet Commonly known as: TEGRETOL XR Take 200-400 mg by mouth 2 (two) times daily. Takes 1 tablet (200mg ) daily, and takes 2 tablets (400mg ) every  night at bedtime  Indication: Manic-Depression   clonazePAM 0.5 MG tablet Commonly known as: KLONOPIN Take 0.5 mg by mouth 2 (two) times daily as needed for anxiety.  Indication: Feeling Anxious   divalproex 250 MG DR tablet Commonly known as: DEPAKOTE Take 750 mg by mouth 2 (two) times daily.  Indication: Schizophrenia   gabapentin 300 MG capsule Commonly known as: NEURONTIN Take 2 capsules (600 mg total) by mouth 3 (three) times daily.  Indication: Neuropathic Pain   haloperidol decanoate 100 MG/ML injection Commonly known as: HALDOL DECANOATE Inject 100 mg into the muscle every 28 (twenty-eight) days.  Indication: Schizophrenia   OLANZapine 20 MG tablet Commonly known as: ZYPREXA Take 20 mg by mouth at bedtime.  Indication: Schizophrenia   risperiDONE 3 MG tablet Commonly known as: RISPERDAL Take 3 mg by mouth at bedtime.  Indication: Schizophrenia   temazepam 30 MG capsule Commonly known as: RESTORIL Take 30 mg by mouth at bedtime.  Indication: Trouble Sleeping        Follow-up recommendations:  Activity:  ad lib Other:  Do not use cocaine, do not use illicit drugs, take medications as directed, continue with ACTT services as an outpatient.  Comments:  Recommendations: 1.  Stop using marijuana and cocaine. 2.  Take medications as directed. 3.  Continue with ACTT services as an outpatient. 4.  Discharge home today.  Signed: , MD 05/31/2020, 3:07 PM

## 2020-05-31 NOTE — BHH Suicide Risk Assessment (Signed)
[facility] Discharge Suicide Risk Assessment   Principal Problem: Schizophrenia [facility] Discharge Diagnoses: Principal Problem:   Schizophrenia ([facility])   Total Time spent with patient: 30 minutes  Musculoskeletal: Strength & Muscle Tone: within normal limits Gait & Station: normal Patient leans: N/A  Psychiatric Specialty Exam: Review of Systems  All other systems reviewed and are negative.   Blood pressure 112/73, pulse 70, temperature 98.4 F (36.9 C), temperature source Oral, resp. rate 18, height 6\' 1"  (1.854 m), weight 79.4 kg, SpO2 98 %.Body mass index is 23.09 kg/m.  General Appearance: Disheveled  Eye ::  Fair  Speech:  Normal Rate409  Volume:  Normal  Mood:  Dysphoric  Affect:  Congruent  Thought Process:  Coherent and Descriptions of Associations: Circumstantial  Orientation:  Full (Time, Place, and Person)  Thought Content:  Paranoid Ideation  Suicidal Thoughts:  No  Homicidal Thoughts:  No  Memory:  Immediate;   Fair Recent;   Fair Remote;   Fair  Judgement:  Fair  Insight:  Fair  Psychomotor Activity:  Normal  Concentration:  Fair  Recall:  [phone] of Knowledge:Fair  Language: Fair  Akathisia:  Negative  Handed:  Right  AIMS (if indicated):     Assets:  Desire for Improvement Resilience  Sleep:     Cognition: WNL  ADL's:  Intact   Mental Status Per Nursing Assessment::   On Admission:  Thoughts of violence towards others  Demographic Factors:  Male, Low socioeconomic status, Living alone and Unemployed  Loss Factors: NA  Historical Factors: Impulsivity  Risk Reduction Factors:   Positive social support  Continued Clinical Symptoms:  Alcohol/Substance Abuse/Dependencies Schizophrenia:   Paranoid or undifferentiated type  Cognitive Features That Contribute To Risk:  None    Suicide Risk:  Minimal: No identifiable suicidal ideation.  Patients presenting with no risk factors but with morbid ruminations; may be classified as minimal  risk based on the severity of the depressive symptoms    Plan Of Care/Follow-up recommendations:  Activity:  ad lib Other:  Do not use drugs.  Do not use cocaine.  Do not smoke marijuana.  Take medications as directed.  Follow-up with your ACTT service team.  [phone], MD 05/31/2020, 2:47 PM

## 2020-05-31 NOTE — Progress Notes (Signed)
  COVID-19 Daily Checkoff  Have you had a fever (temp > 37.80C/100F)  in the past 24 hours?  No  If you have had runny nose, nasal congestion, sneezing in the past 24 hours, has it worsened? No  COVID-19 EXPOSURE  Have you traveled outside the state in the past 14 days? No  Have you been in contact with someone with a confirmed diagnosis of COVID-19 or PUI in the past 14 days without wearing appropriate PPE? No  Have you been living in the same home as a person with confirmed diagnosis of COVID-19 or a PUI (household contact)? No  Have you been diagnosed with COVID-19? No               D:  Patient presents agitated, loud, threatening to harm whoever stole his "money card". Denies intent to harm self. Pt states, "I'm gonna knock that bitch at the social security office out. They are playing with my money. I need something to calm me down right now". Pt impulsive. Received order from MD for Geodon 20mg  IM if patient refuse Risperdal M-tab. Pt requested klonopin but RN informed pt that wasn't one of the options. Patient stated, "Give me the shot". Patient received Geodon 20mg  to left deltoid. Effective as pt became more relaxed. Patient denies any physical complaints when asked. Pt denies SI/AVH.   A: Support and encouragement provided. Routine safety checks conducted every 15 minutes per unit protocol. Encouraged patient to notify staff with any needs or concerns. He agrees. Informed patient of legal consequences if he physically attacks others on the outside.   R: Patient remains safe at this time, patient verbally contracts for safety at this time. Patient states, "I won't hurt nobody because I don't want to go to jail". Plans for patient to be discharged to home today. Will continue to monitor.

## 2020-05-31 NOTE — BH Assessment (Addendum)
BHH Assessment Progress Note  Per Landry Mellow, MD, this pt does not require psychiatric hospitalization at this time.  Pt is to be discharged from the [facility] Observation Unit.  Discharge instructions advise pt to continue treatment with the [facility] ACT Team in [place], pt's current outpatient provider.  [facility] is to be contacted to help facilitate pt's discharge, however, pt is to be discharged no later than 15:00.  At 10:16 this Clinical research associate called [facility] and spoke to [place].  She reports that they cannot pick pt up without pt having a destination in mind; she adds that to their knowledge pt does not have a destination.  I asked if an ACT clinician can come to the Observation Unit to discuss this.  She took my name and number and agreed to ask someone to call me back.  Pt's nurse has been notified.  Doylene Canning, MA Triage Specialist [phone]   Addendum:  At 14:40 this Clinical research associate spoke to [place] with the [facility] ACT Team.  She reports that she is at the [facility] parking lot to pick pt up.  Pt's nurse, Nicolette Bang, has been notified, as well as Dr Jola Babinski.  Doylene Canning, [place] [facility] Coordinator [phone]

## 2020-05-31 NOTE — H&P (Signed)
BH Observation Unit Provider Admission/discharge PAA/H&P  Patient Identification: Peter Wilson MRN:  [id number] Date of Evaluation:  05/31/2020 Chief Complaint:  Schizophrenia (HCC) [F20.9] Principal Diagnosis: Schizophrenia (HCC) Diagnosis:  Principal Problem:   Schizophrenia (HCC)  History of Present Illness: Patient is seen and examined.  Patient is a 44 year old male with a past psychiatric history significant for schizophrenia.  The patient has had multiple visits and admissions to our facility in the past.  He has presented for numerous times for aggressive behavior.  He came to the hospital having thoughts of harming others.  There was no one specific.  He also endorsed auditory hallucinations.  He admitted to marijuana use.  He was placed in the observation unit last night.  This morning he denies any auditory or visual hallucinations.  He denied any suicidal or homicidal ideation.  He is followed by an act team.  Review of his admission laboratories revealed normal electrolytes including liver function enzymes.  Normal CBC.  His acetaminophen was less than 10, salicylate was less than 7.  Blood alcohol was less than 10.  Drug screen was positive for cocaine and marijuana.  Associated Signs/Symptoms: Depression Symptoms:  insomnia, fatigue, suicidal thoughts without plan, loss of energy/fatigue, disturbed sleep, (Hypo) Manic Symptoms:  Delusions, Hallucinations, Impulsivity, Irritable Mood, Labiality of Mood, Anxiety Symptoms:  Excessive Worry, Psychotic Symptoms:  Delusions, Hallucinations: Auditory Paranoia, PTSD Symptoms: Negative Total Time spent with patient: 30 minutes  Past Psychiatric History: Patient has had multiple psychiatric hospitalizations and is well as observation stays.  The electronic medical record revealed 8 emergency room visits at least in the last 6 months.  Is the patient at risk to self? No.  Has the patient been a risk to self in the past 6 months?  No.  Has the patient been a risk to self within the distant past? No.  Is the patient a risk to others? No.  Has the patient been a risk to others in the past 6 months? Yes.    Has the patient been a risk to others within the distant past? Yes.     Prior Inpatient Therapy: Prior Inpatient Therapy: Yes Prior Therapy Dates: 2020 Prior Therapy Facilty/Provider(s): [facility] Reason for Treatment: Schizophrenia Prior Outpatient Therapy: Prior Outpatient Therapy: Yes Prior Therapy Dates: active Prior Therapy Facilty/Provider(s): [facility] Reason for Treatment: schizophrenia Does patient have an ACCT team?: Yes Does patient have Intensive In-House Services?  : No Does patient have [facility] services? : No Does patient have P4CC services?: No  Alcohol Screening: 1. How often do you have a drink containing alcohol?: 2 to 4 times a month 2. How many drinks containing alcohol do you have on a typical day when you are drinking?: 1 or 2 3. How often do you have six or more drinks on one occasion?: Less than monthly AUDIT-C Score: 3 4. How often during the last year have you found that you were not able to stop drinking once you had started?: Never 5. How often during the last year have you failed to do what was normally expected from you because of drinking?: Never 6. How often during the last year have you needed a first drink in the morning to get yourself going after a heavy drinking session?: Never 7. How often during the last year have you had a feeling of guilt of remorse after drinking?: Never 8. How often during the last year have you been unable to remember what happened the night before because you had been  drinking?: Never 9. Have you or someone else been injured as a result of your drinking?: No 10. Has a relative or friend or a doctor or another health worker been concerned about your drinking or suggested you cut down?: No Alcohol Use Disorder Identification Test Final Score (AUDIT):  3 Alcohol Brief Interventions/Follow-up: Patient Refused Substance Abuse History in the last 12 months:  Yes.   Consequences of Substance Abuse: Medical Consequences:  Clearly leads to worsening psychotic symptoms. Previous Psychotropic Medications: Yes  Psychological Evaluations: Yes  Past Medical History:  Past Medical History:  Diagnosis Date  . Marijuana abuse   . Schizophrenia, paranoid (HCC)   . Tardive dyskinesia   . Tobacco abuse     Past Surgical History:  Procedure Laterality Date  . NO PAST SURGERIES     Family History: History reviewed. No pertinent family history. Family Psychiatric History: Noncontributory Tobacco Screening: Have you used any form of tobacco in the last 30 days? (Cigarettes, Smokeless Tobacco, Cigars, and/or Pipes): Yes Tobacco use, Select all that apply: 5 or more cigarettes per day Are you interested in Tobacco Cessation Medications?: No, patient refused Counseled patient on smoking cessation including recognizing danger situations, developing coping skills and basic information about quitting provided: Refused/Declined practical counseling Social History:  Social History   Substance and Sexual Activity  Alcohol Use Yes  . Alcohol/week: 0.0 standard drinks   Comment: Unknown      Social History   Substance and Sexual Activity  Drug Use Yes  . Frequency: 7.0 times per week  . Types: Marijuana   Comment: Unknown     Additional Social History: Marital status: Single    Pain Medications: Denies abuse Prescriptions: Denies abuse Over the Counter: Denies abuse History of alcohol / drug use?: Yes Longest period of sobriety (when/how long): unknown Negative Consequences of Use: Financial, Legal, Personal relationships Withdrawal Symptoms: Irritability, Tremors, Agitation, Aggressive/Assaultive Name of Substance 1: Cocaine 1 - Age of First Use: "Since I was a teenager" 1 - Amount (size/oz): Unknown 1 - Frequency: Daily 1 - Duration:  Unknown 1 - Last Use / Amount: Today Name of Substance 2: Marijuana 2 - Age of First Use: Unknown 2 - Amount (size/oz): "Since I was a teenager" 2 - Frequency: "All day everyday" 2 - Duration: "A very long time" 2 - Last Use / Amount: Today Name of Substance 3: THC 3 - Age of First Use: Unknown 3 - Amount (size/oz): "Enough" 3 - Frequency: "Whenever it is available" 3 - Duration: Unknown 3 - Last Use / Amount: "I don't remember"              Allergies:   Allergies  Allergen Reactions  . Penicillins Rash    Did it involve swelling of the face/tongue/throat, SOB, or low BP? U Did it involve sudden or severe rash/hives, skin peeling, or any reaction on the inside of your mouth or nose? Y  Did you need to seek medical attention at a hospital or doctor's office? Y When did it last happen?childhood If all above answers are "NO", may proceed with cephalosporin use.    Lab Results:  Results for orders placed or performed during the hospital encounter of 05/30/20 (from the past 48 hour(s))  SARS Coronavirus 2 by RT PCR (hospital order, performed in [facility] hospital lab) Nasopharyngeal Urine, Random     Status: None   Collection Time: 05/30/20  9:17 PM   Specimen: Urine, Random; Nasopharyngeal  Result Value Ref Range  SARS Coronavirus 2 NEGATIVE NEGATIVE    Comment: (NOTE) SARS-CoV-2 target nucleic acids are NOT DETECTED. The SARS-CoV-2 RNA is generally detectable in upper and lower respiratory specimens during the acute phase of infection. The lowest concentration of SARS-CoV-2 viral copies this assay can detect is 250 copies / mL. A negative result does not preclude SARS-CoV-2 infection and should not be used as the sole basis for treatment or other patient management decisions.  A negative result may occur with improper specimen collection / handling, submission of specimen other than nasopharyngeal swab, presence of viral mutation(s) within the areas targeted by  this assay, and inadequate number of viral copies (<250 copies / mL). A negative result must be combined with clinical observations, patient history, and epidemiological information. Fact Sheet for Patients:   StrictlyIdeas.no Fact Sheet for Healthcare Providers: BankingDealers.co.za This test is not yet approved or cleared  by the [place] FDA and has been authorized for detection and/or diagnosis of SARS-CoV-2 by FDA under an Emergency Use Authorization (EUA).  This EUA will remain in effect (meaning this test can be used) for the duration of the COVID-19 declaration under Section 564(b)(1) of the Act, 21 U.S.C. section 360bbb-3(b)(1), unless the authorization is terminated or revoked sooner. Performed at [facility], [place] [address], [place], [place] [address]   CBC     Status: Abnormal   Collection Time: 05/31/20  6:30 AM  Result Value Ref Range   WBC 7.7 4.0 - 10.5 K/uL   RBC 4.21 (L) 4.22 - 5.81 MIL/uL   Hemoglobin 12.0 (L) 13.0 - 17.0 g/dL   HCT 39.9 39.0 - 52.0 %   MCV 94.8 80.0 - 100.0 fL   MCH 28.5 26.0 - 34.0 pg   MCHC 30.1 30.0 - 36.0 g/dL   RDW 14.0 11.5 - 15.5 %   Platelets 278 150 - 400 K/uL   nRBC 0.0 0.0 - 0.2 %    Comment: Performed at [facility], [place] [address] [place], [place] [address]  Comprehensive metabolic panel     Status: None   Collection Time: 05/31/20  6:30 AM  Result Value Ref Range   Sodium 141 135 - 145 mmol/L   Potassium 4.0 3.5 - 5.1 mmol/L   Chloride 106 98 - 111 mmol/L   CO2 28 22 - 32 mmol/L   Glucose, Bld 93 70 - 99 mg/dL    Comment: Glucose reference range applies only to samples taken after fasting for at least 8 hours.   BUN 14 6 - 20 mg/dL   Creatinine, Ser 0.82 0.61 - 1.24 mg/dL   Calcium 9.2 8.9 - 10.3 mg/dL   Total Protein 7.2 6.5 - 8.1 g/dL   Albumin 3.9 3.5 - 5.0 g/dL   AST 28 15 - 41 U/L   ALT 10 0 - 44 U/L   Alkaline Phosphatase  55 38 - 126 U/L   Total Bilirubin 0.5 0.3 - 1.2 mg/dL   GFR calc non Af Amer >60 >60 mL/min   GFR calc Af Amer >60 >60 mL/min   Anion gap 7 5 - 15    Comment: Performed at [facility], [place] [address] [place], [place] [address]  Urine rapid drug screen (hosp performed)not at [facility]     Status: Abnormal   Collection Time: 05/31/20  6:30 AM  Result Value Ref Range   Opiates NONE DETECTED NONE DETECTED   Cocaine POSITIVE (A) NONE DETECTED   Benzodiazepines NONE DETECTED NONE DETECTED  Amphetamines NONE DETECTED NONE DETECTED   Tetrahydrocannabinol POSITIVE (A) NONE DETECTED   Barbiturates NONE DETECTED NONE DETECTED    Comment: (NOTE) DRUG SCREEN FOR MEDICAL PURPOSES ONLY.  IF CONFIRMATION IS NEEDED FOR ANY PURPOSE, NOTIFY LAB WITHIN 5 DAYS. LOWEST DETECTABLE LIMITS FOR URINE DRUG SCREEN Drug Class                     Cutoff (ng/mL) Amphetamine and metabolites    1000 Barbiturate and metabolites    200 Benzodiazepine                 200 Tricyclics and metabolites     300 Opiates and metabolites        300 Cocaine and metabolites        300 THC                            50 Performed at [facility], [address], [place], [place] [address]     Blood Alcohol level:  Lab Results  Component Value Date   [facility] <10 05/07/2020   ETH <10 04/11/2020    Metabolic Disorder Labs:  Lab Results  Component Value Date   HGBA1C 4.6 (L) 04/02/2018   MPG 85.32 04/02/2018   MPG 91 04/11/2017   Lab Results  Component Value Date   PROLACTIN 28.0 (H) 11/06/2019   PROLACTIN 26.4 (H) 04/11/2017   Lab Results  Component Value Date   CHOL 202 (H) 04/02/2018   TRIG 102 04/02/2018   HDL 46 04/02/2018   CHOLHDL 4.4 04/02/2018   VLDL 20 04/02/2018   LDLCALC 136 (H) 04/02/2018   LDLCALC 167 (H) 04/11/2017    Current Medications: Current Facility-Administered Medications  Medication Dose Route Frequency Provider Last Rate Last Admin  .  acetaminophen (TYLENOL) tablet 650 mg  650 mg Oral Q6H PRN [name] A, NP   650 mg at 05/30/20 2206  . alum & mag hydroxide-simeth (MAALOX/MYLANTA) 200-200-20 MG/5ML suspension 30 mL  30 mL Oral Q4H PRN [name] A, NP      . magnesium hydroxide (MILK OF MAGNESIA) suspension 30 mL  30 mL Oral Daily PRN [name] A, NP      . risperiDONE (RISPERDAL) tablet 3 mg  3 mg Oral QHS [name] A, NP   3 mg at 05/30/20 2206   PTA Medications: Medications Prior to Admission  Medication Sig Dispense Refill Last Dose  . benztropine (COGENTIN) 1 MG tablet Take 1 tablet (1 mg total) by mouth 2 (two) times daily. 60 tablet 0   . carbamazepine (TEGRETOL XR) 200 MG 12 hr tablet Take 200-400 mg by mouth 2 (two) times daily. Takes 1 tablet (200mg ) daily, and takes 2 tablets (400mg ) every night at bedtime     . clonazePAM (KLONOPIN) 0.5 MG tablet Take 0.5 mg by mouth 2 (two) times daily as needed for anxiety.      . divalproex (DEPAKOTE) 250 MG DR tablet Take 750 mg by mouth 2 (two) times daily.      . haloperidol decanoate (HALDOL DECANOATE) 100 MG/ML injection Inject 100 mg into the muscle every 28 (twenty-eight) days.      OLANZapine (ZYPREXA) 20 MG tablet Take 20 mg by mouth at bedtime.     . risperiDONE (RISPERDAL) 3 MG tablet Take 3 mg by mouth at bedtime.     . temazepam (RESTORIL) 30 MG capsule Take 30 mg by  mouth at bedtime.     . cyclobenzaprine (FLEXERIL) 5 MG tablet Take 1 tablet (5 mg total) by mouth 3 (three) times daily as needed. (Patient not taking: Reported on 04/12/2020) 12 tablet 0 Not Taking at Unknown time  . gabapentin (NEURONTIN) 300 MG capsule Take 2 capsules (600 mg total) by mouth 3 (three) times daily. (Patient not taking: Reported on 04/12/2020) 180 capsule 0 Not Taking at Unknown time    Musculoskeletal: Strength & Muscle Tone: within normal limits Gait & Station: normal Patient leans: N/A  Psychiatric Specialty Exam: Physical Exam  Nursing note and vitals  reviewed. Constitutional: He is oriented to person, place, and time. He appears well-developed and well-nourished.  HENT:  Head: Normocephalic and atraumatic.  Respiratory: Effort normal.  Neurological: He is alert and oriented to person, place, and time.    Review of Systems  Blood pressure 123/72, pulse 72, temperature 97.9 F (36.6 C), temperature source Oral, resp. rate 20, SpO2 100 %.There is no height or weight on file to calculate BMI.  General Appearance: Disheveled  Eye Contact:  Fair  Speech:  Normal Rate  Volume:  Normal  Mood:  Euthymic  Affect:  Flat  Thought Process:  Coherent and Descriptions of Associations: Circumstantial  Orientation:  Full (Time, Place, and Person)  Thought Content:  Delusions, Hallucinations: Auditory and Paranoid Ideation  Suicidal Thoughts:  No  Homicidal Thoughts:  No  Memory:  Immediate;   Fair Recent;   Fair Remote;   Fair  Judgement:  Impaired  Insight:  Lacking  Psychomotor Activity:  Normal  Concentration:  Concentration: Fair and Attention Span: Fair  Recall:  [employer] of Knowledge:  Fair  Language:  Fair  Akathisia:  Negative  Handed:  Right  AIMS (if indicated):     Assets:  Desire for Improvement Resilience  ADL's:  Intact  Cognition:  WNL  Sleep:         Treatment Plan Summary: Daily contact with patient to assess and evaluate symptoms and progress in treatment, Medication management and Plan : Patient is seen and examined.  Patient is a 44 year old male with the above stated past psychiatric history.  He denies suicidal or homicidal ideation right now.  He needs to quit using marijuana cocaine.  His medications are fine when he does not abuse substances.  We will contact his ACTT service today, and have him picked up for outpatient psychiatric follow-up.  No change in his medications.  Observation Level/Precautions:  15 minute checks Laboratory:  CBC Psychotherapy:   Medications:   Consultations:   Discharge  Concerns:   Estimated LOS: Other:    Recommendations: 1.  Stop using marijuana and cocaine. 2.  Take medications as directed. 3.  Continue with ACTT services as an outpatient. 4.  Discharge home today.    Antonieta Pert, MD 6/7/202110:09 AM

## 2020-05-31 NOTE — Discharge Instructions (Signed)
For your behavioral health needs, you are advised to continue treatment with the [facility] ACT Team:       [facility]      [address]      [place], [place] [address]      [phone]      Crisis number: [phone]

## 2020-05-31 NOTE — ED Provider Notes (Signed)
[facility] Emergency Department Provider Note  ____________________________________________   First MD Initiated Contact with Patient 05/31/20 1647     (approximate)  I have reviewed the triage vital signs and the nursing notes.  History  Chief Complaint Behavior Problem    HPI Peter Wilson is a 44 y.o. male past medical history as below, who presents to the emergency department for psychiatric evaluation.  Patient states he has been experiencing increased hallucinations.  Also admits to HI.  He states he is hearing voices that are telling him to harm people.  Denies any SI.  Reports that he has tried to seek care for this several times recently, but feels like he is not being appropriately treated or listened to.  He feels like people do not care about him.  He is upset with his outpatient support team, who is supposed to help him obtain his medications, and says they are not helping him w/ this.  He is emotionally labile on exam and becomes particularly tearful, crying, and loud when discussing his history.   Past Medical Hx Past Medical History:  Diagnosis Date  . Marijuana abuse   . Schizophrenia, paranoid ([place])   . Tardive dyskinesia   . Tobacco abuse     Problem List Patient Active Problem List   Diagnosis Date Noted  . Substance induced mood disorder ([place]) 10/13/2019  . Aggressive behavior   . Tremor 04/24/2018  . Schizophrenia ([place]) 04/01/2018  . Noncompliance 04/10/2017  . Asthma 07/13/2016  . Schizoaffective disorder, bipolar type ([place]) 06/27/2016  . Antisocial traits 09/15/2015  . Tobacco use disorder 09/14/2015  . Cannabis use disorder, moderate, dependence ([place]) 09/14/2015    Past Surgical Hx Past Surgical History:  Procedure Laterality Date  . NO PAST SURGERIES      Medications Prior to Admission medications   Medication Sig Start Date End Date Taking? Authorizing Provider  benztropine (COGENTIN) 1 MG tablet Take 1 tablet  (1 mg total) by mouth 2 (two) times daily. 11/21/19 05/31/20  Patrecia Pour, NP  carbamazepine (TEGRETOL XR) 200 MG 12 hr tablet Take 200-400 mg by mouth 2 (two) times daily. Takes 1 tablet (200mg ) daily, and takes 2 tablets (400mg ) every night at bedtime 05/07/20   [provider]  clonazePAM (KLONOPIN) 0.5 MG tablet Take 0.5 mg by mouth 2 (two) times daily as needed for anxiety.  04/05/20   [provider]  divalproex (DEPAKOTE) 250 MG DR tablet Take 750 mg by mouth 2 (two) times daily.  05/12/20   [provider]  gabapentin (NEURONTIN) 300 MG capsule Take 2 capsules (600 mg total) by mouth 3 (three) times daily. Patient not taking: Reported on 04/12/2020 11/21/19   Patrecia Pour, NP  haloperidol decanoate (HALDOL DECANOATE) 100 MG/ML injection Inject 100 mg into the muscle every 28 (twenty-eight) days.  03/29/20   [provider]  OLANZapine (ZYPREXA) 20 MG tablet Take 20 mg by mouth at bedtime. 05/07/20   [provider]  risperiDONE (RISPERDAL) 3 MG tablet Take 3 mg by mouth at bedtime. 05/12/20   [provider]  temazepam (RESTORIL) 30 MG capsule Take 30 mg by mouth at bedtime. 04/12/20   [provider]    Allergies Penicillins  Family Hx No family history on file.  Social Hx Social History   Tobacco Use  . Smoking status: Current Every Day Smoker    Packs/day: 2.00    Types: Cigarettes  . Smokeless tobacco: Never Used  .  Tobacco comment: Unknown   Substance Use Topics  . Alcohol use: Yes    Alcohol/week: 0.0 standard drinks    Comment: Unknown   . Drug use: Yes    Frequency: 7.0 times per week    Types: Marijuana    Comment: Unknown      Review of Systems  Constitutional: Negative for fever, chills. Eyes: Negative for visual changes. ENT: Negative for sore throat. Cardiovascular: Negative for chest pain. Respiratory: Negative for shortness of breath. Gastrointestinal: Negative for nausea, vomiting.    Genitourinary: Negative for dysuria. Musculoskeletal: Negative for leg swelling. Skin: Negative for rash. Neurological: Negative for for headaches.   Physical Exam  Vital Signs: ED Triage Vitals  Enc Vitals Group     BP 05/31/20 1618 101/87     Pulse Rate 05/31/20 1618 83     Resp 05/31/20 1618 20     Temp 05/31/20 1618 98.9 F (37.2 C)     Temp Source 05/31/20 1618 Oral     SpO2 05/31/20 1618 98 %     Weight 05/31/20 1619 178 lb (80.7 kg)     Height 05/31/20 1619 6' (1.829 m)     Head Circumference --      Peak Flow --      Pain Score --      Pain Loc --      Pain Edu? --      Excl. in GC? --     Constitutional: Alert and oriented.  Head: Normocephalic. Atraumatic. Eyes: Conjunctivae clear. Sclera anicteric. Nose: No congestion. No rhinorrhea. Mouth/Throat: Mucous membranes are moist.  Neck: No stridor.   Cardiovascular: Normal rate. Extremities well perfused. Respiratory: Normal respiratory effort.   Gastrointestinal: Non-distended.  Musculoskeletal: No deformities. Neurologic:  Normal speech and language. No gross focal neurologic deficits are appreciated.  Skin: Skin is warm, dry and intact.  Psychiatric: Reports hallucinations, command.  Reports HI.  Extremely emotionally labile, becomes very tearful and loud when discussing his history.  He is not verbally or physically aggressive towards staff at this time, but seems more so upset at the recent course of events, and his perceived lack of care.  EKG  N/A    Radiology  N/A   Procedures  Procedure(s) performed (including critical care):  Procedures   Initial Impression / Assessment and Plan / ED Course  44 y.o. male who presents to the ED for hallucinations, HI, as above.  Will obtain basic screening labs and consult psychiatry and TTS.   The patient has been placed in psychiatric observation due to the need to provide a safe environment for the patient while obtaining psychiatric consultation  and evaluation, as well as ongoing medical and medication management to treat the patient's condition.   The patient presents initially voluntarily, however concern in the setting of his command hallucinations and HI, and his extreme emotional lability.  As such, will place patient under IVC.  Labs include UDS positive for cannabis and benzos, otherwise fairly unremarkable.  Psychiatry and TTS have been consulted.   Final Clinical Impression(s) / ED Diagnosis  Hallucinations Homicidal ideation.    Note:  This document was prepared using Dragon voice recognition software and may include unintentional dictation errors.   Miguel Aschoff., MD 06/01/20 [phone]

## 2020-05-31 NOTE — ED Notes (Signed)
IVC/  PENDING  CONSULT/  PT  MOVED  TO  [facility]  UNIT 

## 2020-06-01 MED ORDER — CLONAZEPAM 1 MG PO TABS
1.0000 mg | ORAL_TABLET | Freq: Once | ORAL | Status: AC
  Administered 2020-06-01: 1 mg via ORAL
  Filled 2020-06-01: qty 1

## 2020-06-01 MED ORDER — OLANZAPINE 5 MG PO TABS
5.0000 mg | ORAL_TABLET | Freq: Once | ORAL | Status: AC
  Administered 2020-06-01: 5 mg via ORAL
  Filled 2020-06-01: qty 1

## 2020-06-01 MED ORDER — HALOPERIDOL LACTATE 5 MG/ML IJ SOLN: 10.0000 mg | Freq: Once | INTRAMUSCULAR | Status: DC

## 2020-06-01 MED ORDER — OLANZAPINE 5 MG PO TABS
5.0000 mg | ORAL_TABLET | Freq: Two times a day (BID) | ORAL | Status: DC
  Administered 2020-06-01 – 2020-06-02 (×2): 5 mg via ORAL
  Filled 2020-06-01 (×3): qty 1

## 2020-06-01 MED ORDER — LORAZEPAM 2 MG/ML IJ SOLN: 2.0000 mg | Freq: Once | INTRAMUSCULAR | Status: DC

## 2020-06-01 NOTE — ED Notes (Signed)
IVC/Consult completed/ Pending Disposition 

## 2020-06-01 NOTE — Consult Note (Signed)
[facility] Face-to-Face Psychiatry Consult   Reason for Consult:   OOC behaviors, psychosis   Return to  Referring Physician:    ED MD  Patient Identification: Peter Wilson MRN:  [id number] Principal Diagnosis: Diagnosis:    Schizoaffective Disorder  IED  Generalized anxiety Personality disorder    Total Time spent with patient:   One hour  Subjective:   [name] is a 44 y.o. male patient admitted with worsening agitation, psychosis, and anxiety along with OOC behaviors.   He is supposed to be on long acting Haldol, along with Zyprexa and Risperdal, however it is not clear effects or how regular medications have been given.   HPI:  AA male with history of C S A disorder, antisocial traits, here for OOC behaviors and --psychosis. --  Recently ---he was in ER, discharged and now returned with ACT team  He says his voices continue, he is paranoid, fearful, suspicious, voices trying to kill him and feels explosive and upset.   He feels " no one cares about him or his life"--  He has anxiety and depression with depressed irritable edgy mood, frustration, lack of energy motivation concentration enthusiasm and lack of sleep ---he has worry, nervousness tension frustration --due to his living issues and lack of his perception of resources.  ACT team is following however.    Past Psychiatric History:  Multiple previous ER visits and admits, followed by ACT team.    Risk to Self:  does not mention acute SI and plans  Risk to Others:  possible --concern about anger and irritability.  He says he has a person he wants to harm, but does not tell us who this is or specifics  Prior Inpatient Therapy:  see above  Prior Outpatient Therapy:   ACT team  Past Medical History:  Past Medical History:  Diagnosis Date   Marijuana abuse    Schizophrenia, paranoid (HCC)    Tardive dyskinesia    Tobacco abuse     Past Surgical History:  Procedure Laterality Date   NO PAST SURGERIES      Family History: No family history on file. Family Psychiatric  History:  He does not know and he is vague  Social History:  Social History   Substance and Sexual Activity  Alcohol Use Yes   Alcohol/week: 0.0 standard drinks   Comment: Unknown      Social History   Substance and Sexual Activity  Drug Use Yes   Frequency: 7.0 times per week   Types: Marijuana   Comment: Unknown     Social History   Socioeconomic History   Marital status: Single    Spouse name: Not on file   Number of children: Not on file   Years of education: Not on file   Highest education level: Not on file  Occupational History   Occupation: Disability  Tobacco Use   Smoking status: Current Every Day Smoker    Packs/day: 2.00    Types: Cigarettes   Smokeless tobacco: Never Used   Tobacco comment: Unknown   Substance and Sexual Activity   Alcohol use: Yes    Alcohol/week: 0.0 standard drinks    Comment: Unknown    Drug use: Yes    Frequency: 7.0 times per week    Types: Marijuana    Comment: Unknown    Sexual activity: Not on file  Other Topics Concern   Not on file  Social History Narrative   Pt discharged from [facility] on 10/10/2019, was assigned  to a hotel in [place] by his ACTT provider[facility] [place], [place].     Social Determinants of Health   Financial Resource Strain:    Difficulty of Paying Living Expenses:   Food Insecurity:    Worried About Programme researcher, broadcasting/film/video in the Last Year:    Barista in the Last Year:   Transportation Needs:    Freight forwarder (Medical):    Lack of Transportation (Non-Medical):   Physical Activity:    Days of Exercise per Week:    Minutes of Exercise per Session:   Stress:    Feeling of Stress :   Social Connections:    Frequency of Communication with Friends and Family:    Frequency of Social Gatherings with Friends and Family:    Attends Religious Services:    Active Member of Clubs  or Organizations:    Attends Banker Meetings:    Marital Status:    Additional Social History:    Allergies:   Allergies  Allergen Reactions   Penicillins Rash    Did it involve swelling of the face/tongue/throat, SOB, or low BP? U Did it involve sudden or severe rash/hives, skin peeling, or any reaction on the inside of your mouth or nose? Y  Did you need to seek medical attention at a hospital or doctor's office? Y When did it last happen?childhood If all above answers are NO, may proceed with cephalosporin use.     Labs:  Results for orders placed or performed during the hospital encounter of 05/31/20 (from the past 48 hour(s))  Urine Drug Screen, Qualitative     Status: Abnormal   Collection Time: 05/31/20  4:21 PM  Result Value Ref Range   Tricyclic, Ur Screen NONE DETECTED NONE DETECTED   Amphetamines, Ur Screen NONE DETECTED NONE DETECTED   MDMA (Ecstasy)Ur Screen NONE DETECTED NONE DETECTED   Cocaine Metabolite,Ur [place] NONE DETECTED NONE DETECTED   Opiate, Ur Screen NONE DETECTED NONE DETECTED   Phencyclidine (PCP) Ur S NONE DETECTED NONE DETECTED   Cannabinoid 50 Ng, Ur [place]ine POSITIVE (A) NONE DETECTED   Barbiturates, Ur Screen NONE DETECTED NONE DETECTED   Benzodiazepine, Ur Scrn POSITIVE (A) NONE DETECTED   Methadone Scn, Ur NONE DETECTED NONE DETECTED    Comment: (NOTE) Tricyclics + metabolites, urine    Cutoff 1000 ng/mL Amphetamines + metabolites, urine  Cutoff 1000 ng/mL MDMA (Ecstasy), urine              Cutoff 500 ng/mL Cocaine Metabolite, urine          Cutoff 300 ng/mL Opiate + metabolites, urine        Cutoff 300 ng/mL Phencyclidine (PCP), urine         Cutoff 25 ng/mL Cannabinoid, urine                 Cutoff 50 ng/mL Barbiturates + metabolites, urine  Cutoff 200 ng/mL Benzodiazepine, urine              Cutoff 200 ng/mL Methadone, urine                   Cutoff 300 ng/mL The urine drug screen provides only a preliminary,  unconfirmed analytical test result and should not be used for non-medical purposes. Clinical consideration and professional judgment should be applied to any positive drug screen result due to possible interfering substances. A more specific alternate chemical method must be used in order to obtain  a confirmed analytical result. Gas chromatography / mass spectrometry (GC/MS) is the preferred confirmat ory method. Performed at [facility], [address] [place], [place] [address]   Comprehensive metabolic panel     Status: Abnormal   Collection Time: 05/31/20  4:23 PM  Result Value Ref Range   Sodium 139 135 - 145 mmol/L   Potassium 3.9 3.5 - 5.1 mmol/L   Chloride 105 98 - 111 mmol/L   CO2 24 22 - 32 mmol/L   Glucose, Bld 118 (H) 70 - 99 mg/dL    Comment: Glucose reference range applies only to samples taken after fasting for at least 8 hours.   BUN 11 6 - 20 mg/dL   Creatinine, Ser 0.88 0.61 - 1.24 mg/dL   Calcium 9.2 8.9 - 10.3 mg/dL   Total Protein 7.8 6.5 - 8.1 g/dL   Albumin 4.2 3.5 - 5.0 g/dL   AST 31 15 - 41 U/L   ALT 12 0 - 44 U/L   Alkaline Phosphatase 59 38 - 126 U/L   Total Bilirubin 0.7 0.3 - 1.2 mg/dL   GFR calc non Af Amer >60 >60 mL/min   GFR calc Af Amer >60 >60 mL/min   Anion gap 10 5 - 15    Comment: Performed at [facility], [address] [place], [place] [address]  Ethanol     Status: None   Collection Time: 05/31/20  4:23 PM  Result Value Ref Range   Alcohol, Ethyl (B) <10 <10 mg/dL    Comment: (NOTE) Lowest detectable limit for serum alcohol is 10 mg/dL. For medical purposes only. Performed at [facility], [address] [place], [place] [address]   Salicylate level     Status: Abnormal   Collection Time: 05/31/20  4:23 PM  Result Value Ref Range   Salicylate Lvl <9.1 (L) 7.0 - 30.0 mg/dL    Comment: Performed at [facility], [address] [place], [place] [address]  Acetaminophen level     Status:  Abnormal   Collection Time: 05/31/20  4:23 PM  Result Value Ref Range   Acetaminophen (Tylenol), Serum <10 (L) 10 - 30 ug/mL    Comment: (NOTE) Therapeutic concentrations vary significantly. A range of 10-30 ug/mL  may be an effective concentration for many patients. However, some  are best treated at concentrations outside of this range. Acetaminophen concentrations >150 ug/mL at 4 hours after ingestion  and >50 ug/mL at 12 hours after ingestion are often associated with  toxic reactions. Performed at [facility], [address] [place], [place] [address]   cbc     Status: Abnormal   Collection Time: 05/31/20  4:23 PM  Result Value Ref Range   WBC 5.6 4.0 - 10.5 K/uL   RBC 4.40 4.22 - 5.81 MIL/uL   Hemoglobin 12.5 (L) 13.0 - 17.0 g/dL   HCT 40.1 39.0 - 52.0 %   MCV 91.1 80.0 - 100.0 fL   MCH 28.4 26.0 - 34.0 pg   MCHC 31.2 30.0 - 36.0 g/dL   RDW 13.8 11.5 - 15.5 %   Platelets 290 150 - 400 K/uL   nRBC 0.0 0.0 - 0.2 %    Comment: Performed at [facility], [address] [place],  [address]    Current Facility-Administered Medications  Medication Dose Route Frequency Provider Last Rate Last Admin   OLANZapine (ZYPREXA) tablet 5 mg  5 mg Oral BID AC & HS Eulas Post, MD  Current Outpatient Medications  Medication Sig Dispense Refill   benztropine (COGENTIN) 1 MG tablet Take 1 tablet (1 mg total) by mouth 2 (two) times daily. 60 tablet 0   carbamazepine (TEGRETOL XR) 200 MG 12 hr tablet Take 200-400 mg by mouth 2 (two) times daily. Takes 1 tablet (200mg ) daily, and takes 2 tablets (400mg ) every night at bedtime     clonazePAM (KLONOPIN) 0.5 MG tablet Take 0.5 mg by mouth 2 (two) times daily as needed for anxiety.      divalproex (DEPAKOTE) 250 MG DR tablet Take 750 mg by mouth 2 (two) times daily.      gabapentin (NEURONTIN) 300 MG capsule Take 600 mg by mouth 3 (three) times daily.      haloperidol decanoate (HALDOL DECANOATE) 100  MG/ML injection Inject 100 mg into the muscle every 28 (twenty-eight) days.      OLANZapine (ZYPREXA) 20 MG tablet Take 20 mg by mouth at bedtime.     risperiDONE (RISPERDAL) 3 MG tablet Take 3 mg by mouth at bedtime.     temazepam (RESTORIL) 30 MG capsule Take 30 mg by mouth at bedtime.      Musculoskeletal: Strength & Muscle Tone:  Normal  Gait & Station:  Normal  Patient leans: n/a   Psychiatric Specialty Exam: Physical Exam  Per ER   Review of Systems  Blood pressure 134/74, pulse 77, temperature 98.8 F (37.1 C), temperature source Oral, resp. rate 18, height 6' (1.829 m), weight 80.7 kg, SpO2 98 %.Body mass index is 24.14 kg/m.      MENTAL Status     AA male angry edgy frustrated   Somewhat haggard, forlorn  Oriented to person place date and time  Consciousness not clouded or fluctuant Concentration and attention normal  Movements --history of early TD --do not see this in general inspection yet   Mood --angry irritable frustrated  Affect somewhat blunted   Thought process ---illogical disorganized -- Thought content ---says he hears voices -- Feels suspicious, feels paranoid Speech --slightly loud pressured angry  Memory remote and recent immediate intact through general questions  Fund of knowledge and intelligence --below average SI and HI --says voices are bothering him but vague on SI for now  No specific HI ---seems more attention seekin g Judgement insight reliability all poor  abstraction --somewhat concrete ' Rapport --fair to poor, eye contact not that solid                                         Treatment Plan Summary:   At this time meds have been restarted and he has been placed onto ER obs for possible discharge after stabilization versus admission.    Zyprexa --5 mg po bid      Disposition:  , MD 06/01/2020 10:58 AM
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
## 2020-06-01 NOTE — ED Notes (Signed)
Patient told nurse he was feeling anxious, and he needed something for his anxiety and anger, ED DR. Ordered medication one time dose for him.

## 2020-06-01 NOTE — ED Notes (Signed)
Patient is pacing back and forth, talking on the phone, he is agitated, talking about the unit, He also is talking about His bank card, will continue to monitor.

## 2020-06-01 NOTE — ED Notes (Signed)
Pt given meal tray and juice. 

## 2020-06-01 NOTE — ED Notes (Signed)
IVC/PENDING DISPOSITION  

## 2020-06-01 NOTE — BH Assessment (Signed)
Assessment Note  Peter Wilson is an 44 y.o. male.  Patient was brought into the ER by his ACT Team. Per the patient, he has not been compliant with his psychiatric medications and needs medication management. Patient also endorses auditory hallucinations as well as vague HI. Patient has a history of stating that he wants to hurt others and physically aggressive behavior, however he doesn't have a specific plan and no history of aggression. Pt was brought to the ED immediately after being discharged from [facility] and came to [facility] within an hour. While at [facility] patient was psych cleared.  During the interview the patient was labile and irritable. Patient had slurred speech with an elevated voice tone. Patient complained that his ACT team does not care about him.   Diagnosis: Schizophrenia, paranoid  Past Medical History:  Past Medical History:  Diagnosis Date  . Marijuana abuse   . Schizophrenia, paranoid (HCC)   . Tardive dyskinesia   . Tobacco abuse     Past Surgical History:  Procedure Laterality Date  . NO PAST SURGERIES      Family History: No family history on file.  Social History:  reports that he has been smoking cigarettes. He has been smoking about 2.00 packs per day. He has never used smokeless tobacco. He reports current alcohol use. He reports current drug use. Frequency: 7.00 times per week. Drug: Marijuana.  Additional Social History:  Alcohol / Drug Use Pain Medications: See PTA Prescriptions: See PTA Over the Counter: See PTA History of alcohol / drug use?: Yes Longest period of sobriety (when/how long): Unknown Substance #1 Name of Substance 1: Cannabis Substance #2 Name of Substance 2: Benzodiazepine  CIWA: CIWA-Ar BP: 134/74 Pulse Rate: 77 COWS:    Allergies:  Allergies  Allergen Reactions  . Penicillins Rash    Did it involve swelling of the face/tongue/throat, SOB, or low BP? U Did it involve sudden or severe rash/hives, skin peeling, or any reaction on  the inside of your mouth or nose? Y  Did you need to seek medical attention at a hospital or doctor's office? Y When did it last happen?childhood If all above answers are "NO", may proceed with cephalosporin use.     Home Medications: (Not in a hospital admission)   OB/GYN Status:  No LMP for male patient.  General Assessment Data Location of Assessment: [facility] ED TTS Assessment: In system Is this a Tele or Face-to-Face Assessment?: Face-to-Face Is this an Initial Assessment or a Re-assessment for this encounter?: Initial Assessment Patient Accompanied by:: N/A Language Other than English: No Living Arrangements: Homeless/Shelter What gender do you identify as?: Male Date Telepsych consult ordered in [facility]: 05/31/20 Time Telepsych consult ordered in [facility]: 1743 Marital status: Single Maiden name: n/a Pregnancy Status: No Living Arrangements: Alone Can pt return to current living arrangement?: Yes Admission Status: Involuntary Petitioner: ED Attending Is patient capable of signing voluntary admission?: No(Under IVC) Referral Source: Other(ACTT Team) Insurance type: Medicare A and B  Medical Screening Exam [facility] Walk-in ONLY) Medical Exam completed: Yes  Crisis Care Plan Living Arrangements: Alone Legal Guardian: (Self) Name of Psychiatrist: ACT Team Name of Therapist: [facility]  Education Status Is patient currently in school?: No Is the patient employed, unemployed or receiving disability?: Receiving disability income  Risk to self with the past 6 months Suicidal Ideation: No Has patient been a risk to self within the past 6 months prior to admission? : No Suicidal Intent: No Has patient had any suicidal  intent within the past 6 months prior to admission? : No Is patient at risk for suicide?: No Suicidal Plan?: No Has patient had any suicidal plan within the past 6 months prior to admission? : No Access to Means: No Specify Access to  Suicidal Means: n/a What has been your use of drugs/alcohol within the last 12 months?: THC Previous Attempts/Gestures: No How many times?: 0 Other Self Harm Risks: N/A Triggers for Past Attempts: Unknown Intentional Self Injurious Behavior: None Comment - Self Injurious Behavior: N/A Family Suicide History: Unknown Recent stressful life event(s): Other (Comment)(Pt feels ACT team is not meeting his needs) Persecutory voices/beliefs?: No Depression: No Depression Symptoms: Feeling angry/irritable Substance abuse history and/or treatment for substance abuse?: Yes Suicide prevention information given to non-admitted patients: Not applicable  Risk to Others within the past 6 months Homicidal Ideation: Yes-Currently Present Does patient have any lifetime risk of violence toward others beyond the six months prior to admission? : Yes (comment) Thoughts of Harm to Others: Yes-Currently Present Comment - Thoughts of Harm to Others: (Patient refused to name specific people he'd like to harm.) Current Homicidal Intent: No Current Homicidal Plan: No Describe Current Homicidal Plan: Pt refused to elaborate on a specific homicidal plan. Access to Homicidal Means: No Describe Access to Homicidal Means: Patient did not describe access to homicidal means. Identified Victim: n/a History of harm to others?: Yes Assessment of Violence: In distant past Violent Behavior Description: Physical aggression towards healthcare staff and property. Does patient have access to weapons?: No Criminal Charges Pending?: (Unknown) Describe Pending Criminal Charges: (Unknown) Does patient have a court date: No Court Date: (Unknown) Is patient on probation?: Unknown  Psychosis Hallucinations: Auditory Delusions: None noted  Mental Status Report Appearance/Hygiene: Disheveled Eye Contact: Good Motor Activity: Unsteady Speech: Aggressive, Pressured, Slurred, Loud Level of Consciousness: Alert Mood: Angry,  Labile, Irritable Affect: Angry, Irritable, Labile, Threatening Anxiety Level: Moderate Thought Processes: Tangential, Circumstantial Judgement: Impaired Orientation: Person, Place, Time, Situation Obsessive Compulsive Thoughts/Behaviors: Moderate  Cognitive Functioning Concentration: Normal Memory: Recent Intact, Remote Intact Is patient IDD: No Insight: Fair Impulse Control: Fair Appetite: Good(Unknown) Have you had any weight changes? : No Change Amount of the weight change? (lbs): (Unknown) Sleep: Unable to Assess Total Hours of Sleep: (Unknown) Vegetative Symptoms: None  ADLScreening [facility] Assessment Services) Patient's cognitive ability adequate to safely complete daily activities?: Yes Patient able to express need for assistance with ADLs?: Yes Independently performs ADLs?: Yes (appropriate for developmental age)  Prior Inpatient Therapy Prior Inpatient Therapy: Yes Prior Therapy Dates: 04/01/2018 Prior Therapy Facilty/Provider(s): [facility] Reason for Treatment: Schizophrenia  Prior Outpatient Therapy Prior Outpatient Therapy: No Prior Therapy Dates: (n/a) Prior Therapy Facilty/Provider(s): n/a Reason for Treatment: n/a Does patient have an ACCT team?: Yes Does patient have Intensive In-House Services?  : No Does patient have [facility] services? : No Does patient have P4CC services?: No  ADL Screening (condition at time of admission) Patient's cognitive ability adequate to safely complete daily activities?: Yes Is the patient deaf or have difficulty hearing?: No Does the patient have difficulty seeing, even when wearing glasses/contacts?: No Does the patient have difficulty concentrating, remembering, or making decisions?: No Patient able to express need for assistance with ADLs?: Yes Does the patient have difficulty dressing or bathing?: No Independently performs ADLs?: Yes (appropriate for developmental age) Does the patient have difficulty walking or climbing  stairs?: No Weakness of Legs: None Weakness of Arms/Hands: None  Home Assistive Devices/Equipment Home Assistive Devices/Equipment: None  Therapy Consults (  therapy consults require a physician order) PT Evaluation Needed: No OT Evalulation Needed: No SLP Evaluation Needed: No Abuse/Neglect Assessment (Assessment to be complete while patient is alone) Abuse/Neglect Assessment Can Be Completed: Yes Physical Abuse: Denies Verbal Abuse: Denies Sexual Abuse: Denies Exploitation of patient/patient's resources: Denies Self-Neglect: Denies Values / Beliefs Cultural Requests During Hospitalization: None Spiritual Requests During Hospitalization: None Consults Spiritual Care Consult Needed: No Transition of Care Team Consult Needed: No Advance Directives (For Healthcare) Does Patient Have a Medical Advance Directive?: No          Disposition:  Disposition Initial Assessment Completed for this Encounter: Yes  On Site Evaluation by:   Reviewed with Physician:    Foy Guadalajara 06/01/2020 11:48 AM
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
## 2020-06-02 MED ORDER — OLANZAPINE 5 MG PO TABS
5.0000 mg | ORAL_TABLET | Freq: Two times a day (BID) | ORAL | 2 refills | Status: DC
Start: 2020-06-02 — End: 2020-06-20

## 2020-06-02 MED ORDER — CLONAZEPAM 1 MG PO TABS
1.0000 mg | ORAL_TABLET | Freq: Four times a day (QID) | ORAL | Status: DC | PRN
  Administered 2020-06-02: 1 mg via ORAL
  Filled 2020-06-02: qty 1

## 2020-06-02 NOTE — ED Notes (Signed)
Pt. Alert and oriented, warm and dry, in no distress. Pt. Denies SI, HI, and AVH. Pt. Encouraged to let nursing staff know of any concerns or needs. 

## 2020-06-02 NOTE — BH Assessment (Addendum)
TTS reassessment completed. Pt presented calm, minimumally anxious with a  tangential thought process and oriented x 3. Pt denies any recent drug use and reported his last use of marijuana and cocaine to be last Friday. Pt's UDS shows positive for Marijuana and Benzodiazepines 2 days ago. Pt reports to be feeling "pretty good" this morning. Pt denied SA struggles/needs but reports struggles with getting his prescriptions refilled and to need assistance with getting "all his stuff" together. Pt was encouraged to engage with his ACT team for support. Pt denies any current SI/HI/AH/V and was able to contract his safety, stating " I will be ok of if I can get my medicine because I ran out and my ACT team said they didn't have it".  Pt also identified having a case worker with [facility]) who would be willing to transport him back to [place].   Disposition is pending Psych reassessment

## 2020-06-02 NOTE — ED Notes (Signed)

## 2020-06-02 NOTE — ED Notes (Signed)
VOL, pend D/C 

## 2020-06-02 NOTE — Discharge Instructions (Signed)
Follow up with [facility] and PCP.  Return for any additional questions or concerns.

## 2020-06-02 NOTE — Final Progress Note (Signed)
Physician Final Progress Note  Patient ID: Peter Wilson MRN: [id number] DOB/AGE: [birth year] 44 y.o.  Admit date: 05/31/2020 Admitting provider: No admitting provider for patient encounter. Discharge date: 06/02/2020   Admission Diagnoses:  Bipolar mixed disorder     Discharge Diagnoses:  Active Problems:   * No active hospital problems. *  Same  Consults:  ED and Psych  Significant Findings/ Diagnostic Studies:   Calmer, oriented to person place date and time  Not clouded or fluctuant  Mood more stable Less angry and edgy Wants to go home IVC rescinded  He contracts for safety no active SI and HI  No frank severe psychosis or other changes      Procedures:  Psych rounds   Discharge Condition: stable  Disposition:  There are no questions and answers to display.        Diet:   Regular    Discharge Activity: as tolerated   To go to parents and also outpatient community mental health.  Should go to AA NA related programming ACT team to connect, groups men's service groups      Total time spent taking care of this patient:62minutes  Signed: Roselind Messier 06/02/2020, 1:24 PM

## 2020-06-02 NOTE — ED Provider Notes (Signed)
Emergency Medicine Observation Re-evaluation Note  Peter Wilson is a 44 y.o. male, seen on rounds today.  Pt initially presented to the ED for complaints of Behavior Problem Currently, the patient is resting.  Physical Exam  BP 111/75 (BP Location: Right Arm)   Pulse 76   Temp 98.7 F (37.1 C) (Oral)   Resp 17   Ht 6' (1.829 m)   Wt 80.7 kg   SpO2 99%   BMI 24.14 kg/m  Physical Exam  ED Course / MDM  EKG:    I have reviewed the labs performed to date as well as medications administered while in observation.  Recent changes in the last 24 hours include none. Plan  Current plan is for CM/SW psych eval and dispo. Patient is under full IVC at this time.   Willy Eddy, MD 06/02/20 [phone]

## 2020-06-02 NOTE — ED Provider Notes (Signed)
The patient has been evaluated at bedside by Dr. Smith Robert, psychiatry.  Patient is clinically stable.  Not felt to be a danger to self or others.  No SI or Hi.  No indication for inpatient psychiatric admission at this time.  Appropriate for continued outpatient therapy.    Willy Eddy, MD 06/02/20 1354

## 2020-06-05 ENCOUNTER — Emergency Department (HOSPITAL_COMMUNITY)
Admission: EM | Admit: 2020-06-05 | Discharge: 2020-06-05 | Disposition: A | Discharge status: Home / Self Care | Payer: Medicare Other | Attending: Emergency Medicine | Admitting: Emergency Medicine

## 2020-06-05 ENCOUNTER — Observation Stay (HOSPITAL_COMMUNITY): Admission: AD | Admit: 2020-06-05 | Payer: Medicare Other | Source: Intra-hospital | Admitting: Psychiatry

## 2020-06-05 ENCOUNTER — Encounter (HOSPITAL_COMMUNITY): Payer: Self-pay | Admitting: Emergency Medicine

## 2020-06-05 ENCOUNTER — Other Ambulatory Visit: Payer: Self-pay

## 2020-06-05 DIAGNOSIS — R440 Auditory hallucinations: Secondary | ICD-10-CM | POA: Insufficient documentation

## 2020-06-05 DIAGNOSIS — Z20822 Contact with and (suspected) exposure to covid-19: Secondary | ICD-10-CM | POA: Insufficient documentation

## 2020-06-05 DIAGNOSIS — F17210 Nicotine dependence, cigarettes, uncomplicated: Secondary | ICD-10-CM | POA: Diagnosis not present

## 2020-06-05 DIAGNOSIS — Z79899 Other long term (current) drug therapy: Secondary | ICD-10-CM | POA: Insufficient documentation

## 2020-06-05 DIAGNOSIS — R443 Hallucinations, unspecified: Secondary | ICD-10-CM | POA: Diagnosis present

## 2020-06-05 DIAGNOSIS — F200 Paranoid schizophrenia: Secondary | ICD-10-CM | POA: Insufficient documentation

## 2020-06-05 LAB — COMPREHENSIVE METABOLIC PANEL
ALT: 16 U/L (ref 0–44)
AST: 39 U/L (ref 15–41)
Albumin: 3.9 g/dL (ref 3.5–5.0)
Alkaline Phosphatase: 59 U/L (ref 38–126)
Anion gap: 12 (ref 5–15)
BUN: 10 mg/dL (ref 6–20)
CO2: 24 mmol/L (ref 22–32)
Calcium: 9.1 mg/dL (ref 8.9–10.3)
Chloride: 100 mmol/L (ref 98–111)
Creatinine, Ser: 1.02 mg/dL (ref 0.61–1.24)
GFR calc Af Amer: >60 mL/min (ref >60)
GFR calc non Af Amer: >60 mL/min (ref >60)
Glucose, Bld: 164 mg/dL — ABNORMAL HIGH (ref 70–99)
Potassium: 3.9 mmol/L (ref 3.5–5.1)
Sodium: 136 mmol/L (ref 135–145)
Total Bilirubin: 0.7 mg/dL (ref 0.3–1.2)
Total Protein: 7.3 g/dL (ref 6.5–8.1)

## 2020-06-05 LAB — VALPROIC ACID LEVEL: Valproic Acid Lvl: <10 ug/mL — ABNORMAL LOW (ref 50.0–100.0)

## 2020-06-05 LAB — CBC
HCT: 38.1 % — ABNORMAL LOW (ref 39.0–52.0)
Hemoglobin: 11.8 g/dL — ABNORMAL LOW (ref 13.0–17.0)
MCH: 29.4 pg (ref 26.0–34.0)
MCHC: 31 g/dL (ref 30.0–36.0)
MCV: 94.8 fL (ref 80.0–100.0)
Platelets: 283 10*3/uL (ref 150–400)
RBC: 4.02 MIL/uL — ABNORMAL LOW (ref 4.22–5.81)
RDW: 13.8 % (ref 11.5–15.5)
WBC: 7.1 10*3/uL (ref 4.0–10.5)
nRBC: 0 % (ref 0.0–0.2)

## 2020-06-05 LAB — SARS CORONAVIRUS 2 BY RT PCR (HOSPITAL ORDER, PERFORMED IN ~~LOC~~ HOSPITAL LAB): SARS Coronavirus 2: NEGATIVE

## 2020-06-05 LAB — RAPID URINE DRUG SCREEN, HOSP PERFORMED
Amphetamines: NOT DETECTED
Barbiturates: NOT DETECTED
Benzodiazepines: NOT DETECTED
Cocaine: POSITIVE — AB
Opiates: NOT DETECTED
Tetrahydrocannabinol: POSITIVE — AB

## 2020-06-05 LAB — ETHANOL: Alcohol, Ethyl (B): <10 mg/dL (ref <10)

## 2020-06-05 MED ORDER — CLONAZEPAM 0.5 MG PO TABS
0.5000 mg | ORAL_TABLET | Freq: Two times a day (BID) | ORAL | Status: DC | PRN
  Administered 2020-06-05: 0.5 mg via ORAL
  Filled 2020-06-05: qty 1

## 2020-06-05 MED ORDER — CARBAMAZEPINE ER 200 MG PO TB12
200.0000 mg | ORAL_TABLET | Freq: Every day | ORAL | Status: DC
  Administered 2020-06-05: 200 mg via ORAL
  Filled 2020-06-05: qty 1

## 2020-06-05 MED ORDER — RISPERIDONE 3 MG PO TABS: 3.0000 mg | ORAL_TABLET | Freq: Every day | ORAL | Status: DC

## 2020-06-05 MED ORDER — CARBAMAZEPINE ER 200 MG PO TB12
400.0000 mg | ORAL_TABLET | Freq: Every day | ORAL | Status: DC
  Filled 2020-06-05: qty 2

## 2020-06-05 MED ORDER — TEMAZEPAM 15 MG PO CAPS: 30.0000 mg | ORAL_CAPSULE | Freq: Every day | ORAL | Status: DC

## 2020-06-05 MED ORDER — BENZTROPINE MESYLATE 1 MG PO TABS
1.0000 mg | ORAL_TABLET | Freq: Two times a day (BID) | ORAL | Status: DC
  Administered 2020-06-05: 1 mg via ORAL
  Filled 2020-06-05: qty 1

## 2020-06-05 MED ORDER — DIVALPROEX SODIUM 250 MG PO DR TAB
750.0000 mg | DELAYED_RELEASE_TABLET | Freq: Two times a day (BID) | ORAL | Status: DC
  Administered 2020-06-05: 750 mg via ORAL
  Filled 2020-06-05: qty 3

## 2020-06-05 MED ORDER — GABAPENTIN 300 MG PO CAPS
600.0000 mg | ORAL_CAPSULE | Freq: Three times a day (TID) | ORAL | Status: DC
  Administered 2020-06-05 (×2): 600 mg via ORAL
  Filled 2020-06-05 (×2): qty 2

## 2020-06-05 MED ORDER — OLANZAPINE 5 MG PO TABS
5.0000 mg | ORAL_TABLET | Freq: Two times a day (BID) | ORAL | Status: DC
  Administered 2020-06-05: 5 mg via ORAL
  Filled 2020-06-05 (×2): qty 1

## 2020-06-05 NOTE — ED Provider Notes (Addendum)
Emergency Medicine Observation Re-evaluation Note  Peter Wilson is a 44 y.o. male, seen on rounds today.  Pt initially presented to the ED for complaints of Insomnia, and on arrival stated that he felt like he was having a mental breakdown, was unwilling to elaborate further but felt he needed inpatient psychiatric treatment.  Currently, the patient is sleeping comfortably.  Nursing staff denies any acute events or needs at this time.  Behavioral health assessment completed around 6 AM this morning, recommend observation and reevaluation by psychiatry, they plan to coordinate case with patient's [facility]   Physical Exam  BP (!) 93/54   Pulse (!) 56   Temp 98.5 F (36.9 C) (Oral)   Resp 18   SpO2 97%  Physical Exam Vitals and nursing note reviewed.  Constitutional:      General: He is not in acute distress.    Appearance: He is well-developed. He is not diaphoretic.     Comments: Currently sleeping comfortably and in no distress  HENT:     Head: Normocephalic and atraumatic.  Eyes:     General:        Right eye: No discharge.        Left eye: No discharge.  Pulmonary:     Effort: Pulmonary effort is normal. No respiratory distress.  Neurological:     Coordination: Coordination normal.  Psychiatric:        Behavior: Behavior normal.     ED Course / MDM   Labs Reviewed  COMPREHENSIVE METABOLIC PANEL - Abnormal; Notable for the following components:      Result Value   Glucose, Bld 164 (*)    All other components within normal limits  CBC - Abnormal; Notable for the following components:   RBC 4.02 (*)    Hemoglobin 11.8 (*)    HCT 38.1 (*)    All other components within normal limits  VALPROIC ACID LEVEL - Abnormal; Notable for the following components:   Valproic Acid Lvl <10 (*)    All other components within normal limits  SARS CORONAVIRUS 2 BY RT PCR (HOSPITAL ORDER, PERFORMED IN [facility])  ETHANOL  RAPID URINE DRUG SCREEN, HOSP PERFORMED     I have reviewed the labs performed to date as well as medications administered while in observation.  No recent changes in the last 24 hours. Plan  Current plan is for ED observation and re-evaluation with psychiatry, psych planning to coordinate with patients ACT Team. Patient is not under full IVC at this time.   Legrand Rams 06/05/20 1156   6:33 PM notified by psychiatry that patient is asking to leave, they do not feel that he needs to be IVC and can be discharged home if he wishes.  They recommend that he follow-up with his act team for refills of his medications as it appears that these were refilled and sent to his pharmacy just last week.  Patient ambulatory and in no distress, discharged home in good condition.   Dartha Lodge, PA-C 06/05/20 Maureen Chatters    Linwood Dibbles, MD 06/06/20 1006
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
## 2020-06-05 NOTE — ED Notes (Signed)
Patient verbalizes understanding of discharge instructions . Opportunity for questions and answers were provided . Armband removed by staff ,Pt discharged from ED. W/C  offered at D/C  and Declined W/C at D/C and was escorted to lobby by RN.  

## 2020-06-05 NOTE — ED Triage Notes (Signed)
Patient reports insomnia for 5 days with poor appetite this week , denies SI or HI , no hallucinations .

## 2020-06-05 NOTE — Progress Notes (Signed)
Pt can come to [facility] OBS unit for overnight.  Attending is MD Lucianne Muss.  Report can be called to [phone] when transportation has been arranged.

## 2020-06-05 NOTE — ED Notes (Signed)
Pt states "I am having a mental break but I do not want tot talk about it because it is still fresh in my head".
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
## 2020-06-05 NOTE — Discharge Instructions (Signed)
Follow-up with your act team for refills of your medications  Return to the ED if you would like further evaluation or observation.

## 2020-06-08 ENCOUNTER — Other Ambulatory Visit: Payer: Self-pay

## 2020-06-08 ENCOUNTER — Encounter (HOSPITAL_COMMUNITY): Payer: Self-pay | Admitting: Emergency Medicine

## 2020-06-08 ENCOUNTER — Emergency Department (HOSPITAL_COMMUNITY)
Admission: EM | Admit: 2020-06-08 | Discharge: 2020-06-09 | Disposition: A | Discharge status: Home / Self Care | Payer: Medicare Other | Attending: Emergency Medicine | Admitting: Emergency Medicine

## 2020-06-08 DIAGNOSIS — Z046 Encounter for general psychiatric examination, requested by authority: Secondary | ICD-10-CM | POA: Diagnosis not present

## 2020-06-08 DIAGNOSIS — F17210 Nicotine dependence, cigarettes, uncomplicated: Secondary | ICD-10-CM | POA: Diagnosis not present

## 2020-06-08 DIAGNOSIS — R443 Hallucinations, unspecified: Secondary | ICD-10-CM

## 2020-06-08 DIAGNOSIS — Z79899 Other long term (current) drug therapy: Secondary | ICD-10-CM | POA: Diagnosis not present

## 2020-06-08 DIAGNOSIS — F200 Paranoid schizophrenia: Secondary | ICD-10-CM | POA: Insufficient documentation

## 2020-06-08 DIAGNOSIS — J45909 Unspecified asthma, uncomplicated: Secondary | ICD-10-CM | POA: Diagnosis not present

## 2020-06-08 DIAGNOSIS — R519 Headache, unspecified: Secondary | ICD-10-CM | POA: Diagnosis not present

## 2020-06-08 DIAGNOSIS — F1490 Cocaine use, unspecified, uncomplicated: Secondary | ICD-10-CM | POA: Diagnosis not present

## 2020-06-08 DIAGNOSIS — R440 Auditory hallucinations: Secondary | ICD-10-CM | POA: Diagnosis present

## 2020-06-08 DIAGNOSIS — F1290 Cannabis use, unspecified, uncomplicated: Secondary | ICD-10-CM | POA: Insufficient documentation

## 2020-06-08 LAB — COMPREHENSIVE METABOLIC PANEL
ALT: 12 U/L (ref 0–44)
AST: 34 U/L (ref 15–41)
Albumin: 4 g/dL (ref 3.5–5.0)
Alkaline Phosphatase: 50 U/L (ref 38–126)
Anion gap: 9 (ref 5–15)
BUN: 9 mg/dL (ref 6–20)
CO2: 25 mmol/L (ref 22–32)
Calcium: 9.3 mg/dL (ref 8.9–10.3)
Chloride: 103 mmol/L (ref 98–111)
Creatinine, Ser: 1.22 mg/dL (ref 0.61–1.24)
GFR calc Af Amer: >60 mL/min (ref >60)
GFR calc non Af Amer: >60 mL/min (ref >60)
Glucose, Bld: 111 mg/dL — ABNORMAL HIGH (ref 70–99)
Potassium: 4.3 mmol/L (ref 3.5–5.1)
Sodium: 137 mmol/L (ref 135–145)
Total Bilirubin: 0.7 mg/dL (ref 0.3–1.2)
Total Protein: 7.3 g/dL (ref 6.5–8.1)

## 2020-06-08 LAB — RAPID URINE DRUG SCREEN, HOSP PERFORMED
Amphetamines: NOT DETECTED
Barbiturates: NOT DETECTED
Benzodiazepines: NOT DETECTED
Cocaine: POSITIVE — AB
Opiates: NOT DETECTED
Tetrahydrocannabinol: POSITIVE — AB

## 2020-06-08 LAB — CBC
HCT: 37.8 % — ABNORMAL LOW (ref 39.0–52.0)
Hemoglobin: 11.8 g/dL — ABNORMAL LOW (ref 13.0–17.0)
MCH: 29.1 pg (ref 26.0–34.0)
MCHC: 31.2 g/dL (ref 30.0–36.0)
MCV: 93.1 fL (ref 80.0–100.0)
Platelets: 306 10*3/uL (ref 150–400)
RBC: 4.06 MIL/uL — ABNORMAL LOW (ref 4.22–5.81)
RDW: 13.6 % (ref 11.5–15.5)
WBC: 6.3 10*3/uL (ref 4.0–10.5)
nRBC: 0 % (ref 0.0–0.2)

## 2020-06-08 LAB — ETHANOL: Alcohol, Ethyl (B): <10 mg/dL (ref <10)

## 2020-06-08 NOTE — ED Triage Notes (Signed)
Patient reports auditory hallucinations this morning , denies SI or HI, requesting psychiatric treatment.

## 2020-06-09 ENCOUNTER — Other Ambulatory Visit: Payer: Self-pay

## 2020-06-09 ENCOUNTER — Ambulatory Visit (INDEPENDENT_AMBULATORY_CARE_PROVIDER_SITE_OTHER)
Admission: EM | Admit: 2020-06-09 | Discharge: 2020-06-09 | Disposition: A | Discharge status: Home / Self Care | Payer: Medicare Other | Source: Home / Self Care | Attending: Registered Nurse | Admitting: Registered Nurse

## 2020-06-09 DIAGNOSIS — F1220 Cannabis dependence, uncomplicated: Secondary | ICD-10-CM | POA: Diagnosis not present

## 2020-06-09 DIAGNOSIS — Z91199 Patient's noncompliance with other medical treatment and regimen due to unspecified reason: Secondary | ICD-10-CM

## 2020-06-09 DIAGNOSIS — Z9119 Patient's noncompliance with other medical treatment and regimen: Secondary | ICD-10-CM

## 2020-06-09 DIAGNOSIS — F250 Schizoaffective disorder, bipolar type: Secondary | ICD-10-CM

## 2020-06-09 DIAGNOSIS — F1994 Other psychoactive substance use, unspecified with psychoactive substance-induced mood disorder: Secondary | ICD-10-CM

## 2020-06-09 MED ORDER — CLONAZEPAM 0.5 MG PO TABS
0.5000 mg | ORAL_TABLET | Freq: Once | ORAL | Status: AC
  Administered 2020-06-09: 0.5 mg via ORAL
  Filled 2020-06-09: qty 1

## 2020-06-09 MED ORDER — CARBAMAZEPINE ER 200 MG PO TB12
200.0000 mg | ORAL_TABLET | Freq: Every day | ORAL | Status: DC
  Filled 2020-06-09: qty 1

## 2020-06-09 MED ORDER — DIVALPROEX SODIUM 250 MG PO DR TAB
750.0000 mg | DELAYED_RELEASE_TABLET | Freq: Two times a day (BID) | ORAL | Status: DC
  Administered 2020-06-09: 750 mg via ORAL
  Filled 2020-06-09: qty 3

## 2020-06-09 MED ORDER — BENZTROPINE MESYLATE 1 MG PO TABS
1.0000 mg | ORAL_TABLET | Freq: Two times a day (BID) | ORAL | Status: DC
  Administered 2020-06-09: 1 mg via ORAL
  Filled 2020-06-09: qty 1

## 2020-06-09 MED ORDER — GABAPENTIN 400 MG PO CAPS
600.0000 mg | ORAL_CAPSULE | Freq: Three times a day (TID) | ORAL | Status: DC
  Administered 2020-06-09: 600 mg via ORAL
  Filled 2020-06-09: qty 1

## 2020-06-09 MED ORDER — CARBAMAZEPINE ER 200 MG PO TB12: 400.0000 mg | ORAL_TABLET | Freq: Every day | ORAL | Status: DC

## 2020-06-09 NOTE — ED Provider Notes (Signed)
[facility] EMERGENCY DEPARTMENT Provider Note   CSN: [id number] Arrival date & time: 06/08/20  2014     History Chief Complaint  Patient presents with   Hallucinations    Peter Wilson is a 44 y.o. male.  HPI    Patient presents for psychiatric evaluation.  Patient reports he is having auditory hallucinations.  He reports it sounds like a car is driving 263 miles an hour and slamming on the brakes.  He reports he has not been receiving all of his medications as an outpatient.  He denies any other hallucinations.  He has a mild headache, but no other acute complaints Past Medical History:  Diagnosis Date   Marijuana abuse    Schizophrenia, paranoid (HCC)    Tardive dyskinesia    Tobacco abuse     Patient Active Problem List   Diagnosis Date Noted   Substance induced mood disorder (HCC) 10/13/2019   Aggressive behavior    Tremor 04/24/2018   Schizophrenia (HCC) 04/01/2018   Noncompliance 04/10/2017   Asthma 07/13/2016   Schizoaffective disorder, bipolar type (HCC) 06/27/2016   Antisocial traits 09/15/2015   Tobacco use disorder 09/14/2015   Cannabis use disorder, moderate, dependence (HCC) 09/14/2015    Past Surgical History:  Procedure Laterality Date   NO PAST SURGERIES         No family history on file.  Social History   Tobacco Use   Smoking status: Current Every Day Smoker    Packs/day: 2.00    Types: Cigarettes   Smokeless tobacco: Never Used   Tobacco comment: Unknown   Substance Use Topics   Alcohol use: Yes    Alcohol/week: 0.0 standard drinks    Comment: Unknown    Drug use: Yes    Frequency: 7.0 times per week    Types: Marijuana    Comment: Unknown     Home Medications Prior to Admission medications   Medication Sig Start Date End Date Taking? Authorizing Provider  benztropine (COGENTIN) 1 MG tablet Take 1 tablet (1 mg total) by mouth 2 (two) times daily. 11/21/19 05/31/20  Charm Rings, NP    carbamazepine (TEGRETOL XR) 200 MG 12 hr tablet Take 200-400 mg by mouth 2 (two) times daily. Takes 1 tablet (200mg ) daily, and takes 2 tablets (400mg ) every night at bedtime 05/07/20   [provider]  clonazePAM (KLONOPIN) 0.5 MG tablet Take 0.5 mg by mouth 2 (two) times daily as needed for anxiety.  04/05/20   [provider]  divalproex (DEPAKOTE) 250 MG DR tablet Take 750 mg by mouth 2 (two) times daily.  05/12/20   [provider]  gabapentin (NEURONTIN) 300 MG capsule Take 600 mg by mouth 3 (three) times daily.     [provider]  haloperidol decanoate (HALDOL DECANOATE) 100 MG/ML injection Inject 100 mg into the muscle every 28 (twenty-eight) days.  03/29/20   [provider]  OLANZapine (ZYPREXA) 5 MG tablet Take 1 tablet (5 mg total) by mouth in the morning and at bedtime. 06/02/20 06/02/21  08/02/20, MD  risperiDONE (RISPERDAL) 3 MG tablet Take 3 mg by mouth at bedtime. 05/12/20   [provider]  temazepam (RESTORIL) 30 MG capsule Take 30 mg by mouth at bedtime. 04/12/20   [provider]    Allergies    Penicillins  Review of Systems   Review of Systems  Neurological: Positive for headaches.  Psychiatric/Behavioral: Positive for hallucinations.  All other systems reviewed and are negative.  Physical Exam Updated Vital Signs BP 108/76 (BP Location: Right Arm)    Pulse (!) 56    Temp (!) 97.4 F (36.3 C) (Axillary)    Resp 20    Ht 1.829 m (6')    Wt 77.1 kg    SpO2 97%    BMI 23.06 kg/m   Physical Exam CONSTITUTIONAL: Disheveled, mildly anxious HEAD: Normocephalic/atraumatic EYES: EOMI/PERRL ENMT: Mucous membranes moist NECK: supple no meningeal signs SPINE/BACK:entire spine nontender CV: S1/S2 noted, no murmurs/rubs/gallops noted LUNGS: Lungs are clear to auscultation bilaterally, no apparent distress ABDOMEN: soft, nontender NEURO: Pt is awake/alert/appropriate, moves all extremitiesx4.  No facial  droop.  EXTREMITIES: pulses normal/equal, full ROM SKIN: warm, color normal PSYCH: Poor eye contact, but patient answers questions appropriately.  He appears mildly anxious.  ED Results / Procedures / Treatments   Labs (all labs ordered are listed, but only abnormal results are displayed) Labs Reviewed  COMPREHENSIVE METABOLIC PANEL - Abnormal; Notable for the following components:      Result Value   Glucose, Bld 111 (*)    All other components within normal limits  CBC - Abnormal; Notable for the following components:   RBC 4.06 (*)    Hemoglobin 11.8 (*)    HCT 37.8 (*)    All other components within normal limits  RAPID URINE DRUG SCREEN, HOSP PERFORMED - Abnormal; Notable for the following components:   Cocaine POSITIVE (*)    Tetrahydrocannabinol POSITIVE (*)    All other components within normal limits  ETHANOL    EKG None  Radiology No results found.  Procedures Procedures  Medications Ordered in ED Medications  benztropine (COGENTIN) tablet 1 mg (has no administration in time range)  carbamazepine (TEGRETOL XR) 12 hr tablet 400 mg (has no administration in time range)  clonazePAM (KLONOPIN) tablet 0.5 mg (has no administration in time range)  divalproex (DEPAKOTE) DR tablet 750 mg (has no administration in time range)  gabapentin (NEURONTIN) capsule 600 mg (has no administration in time range)  carbamazepine (TEGRETOL XR) 12 hr tablet 200 mg (has no administration in time range)    ED Course  I have reviewed the triage vital signs and the nursing notes.  Pertinent labs  results that were available during my care of the patient were reviewed by me and considered in my medical decision making (see chart for details).    MDM Rules/Calculators/A&P                          Patient presents for his ninth ER visit in 6 months.  He claims he has not received his outpatient medications.  He is followed by the outpatient act team.  He does not mention suicidal  thoughts. 6:03 AM Patient resting comfortably.  He has no new complaints.  Patient is not appear grossly psychotic. No other signs of acute psychiatric emergency  He reports he does not have his medications.  I have ordered some of his home morning medications.  He was instructed to call his ACT team member today. Patient can be managed as an outpatient Final Clinical Impression(s) / ED Diagnoses Final diagnoses:  Hallucinations    Rx / DC Orders ED Discharge Orders    None       Ripley Fraise, MD 06/09/20 [id number]

## 2020-06-09 NOTE — Discharge Instructions (Addendum)
Substance Abuse Treatment Programs ° °Intensive Outpatient Programs °[facility]     °[address]      °[place], [place]                   °[phone]      ° °[facility] °[address] °[place], [place] °[phone] ° °[facility]     °(Inpatient and outpatient)     °[address]           °[phone]   ° °[facility] °[phone] (Suboxone and Methadone) ° °[address]      °[place], [place] [address]      °[phone]      ° °[address] °[place], [place] °852-3033 ° °[facility] (Outpatient/Inpatient, Chemical)    °(insurance only) [phone]      °       °[facility] (Groups & Residential) °[place], [place] °[phone] ° °   °[facility]     °[address]     °[place], [place]      °[phone]      ° °[facility] (for caregivers and family) °[address] °[place], [place] °336-299-4655 ° ° ° ° ° °Residential Treatment Programs °[facility]      °[address], [place], [place] [address]  °([phone]      ° °[facility] °[address] [place], [place] [address] °[phone] ° °[facility]        °336-248-8914      ° °[facility] °[phone] ° °[facility]             °[address]                                         °[place], [place]                                                °[phone] or 336-784-9470                              ° °[facility] [place] °[address] °[place], [address] °[phone] ° °[facility]    °[address]      °[place], [place]     °[phone]      ° °[facility] °[address] °, [place] °336-285-9073 ° °[facility]   °[address]     °[place], [place] [address]     °[phone]      °Admissions: 8am-3pm M-F ° °[facility] °[address] °[place] °336-227-7417 ° °[facility]: Residential Program (90 Days)   °[place], [place]      °336-725-8389 or 800-758-6077    ° °[facility] °[place], [place] °(Walk in Hours over the weekend or by referral) ° °[facility] °[address], [place], [place] [address] °([phone] ° °Crisis Mobile: [facility]:  1-877-626-1772 (for crisis response 24 hours a day) °[facility] Hotline:      1-800-256-2452 °Outpatient Psychiatry and Counseling ° °[facility]: Mobile Crisis   Management 24 hours:  1-877-626-1772 ° °[facility] sliding scale fee and walk in schedule: M-F 8am-12pm/1pm-3pm °[address]  °[place], [place] [address] °[phone] ° °[facility] °[address] °[place], [place] [address] °336-703-9650 ° °[facility] ([facility] known [facility])- new patient walk-in appointments available Monday - Friday 8am -3pm.          °[address] °[place], [place] [address] °336-676-6840 or crisis line- 336-676-6905 ° °[facility] Intensive Outpatient Therapy Program °[address] °[place], [place] [address] °336-832-9804 ° °[facility]                  °Crisis Services      °[phone]      °[address]     °[place], [place] [address]                ° °[facility]   °[facility] °[phone] °[address] °[place], [place] [address] ° ° °[facility]          °[address]  °[place], [place] [address]       °([phone] ° °[facility] °[address] °[place], [place] [address] °336-292-1510 ° °[facility]    °[address]    °[place], [place] [address]     °[phone]      ° °Parish McKinney, MD     °[address]     °[place] [address]     °336-282-1251     °  °[facility] °[address] °[place] [address] ° °[facility]     °[address]     °[place], [place]      °336-542-2076      ° °[facility] °Shamsher Ahluwalia, MD °[address] °[place], [place] [address] °[phone] ° °[facility]     °[address]     °[place], [place] [address]     °336-274-1237      ° °[facility] °[address] °[place], [place] [address] °336-854-4450 °Resources for Temporary Residential Assistance/Crisis Centers ° °DAY CENTERS °[facility] °M-F 8am-3pm   °[address] [place], [place] [address]   336-332-0824 °Services include: laundry, barbering, support groups, case management, phone  & computer access, showers, AA/NA mtgs, mental health/substance abuse nurse, job skills class, disability information, VA assistance, spiritual classes, etc.  ° °HOMELESS SHELTERS ° °[facility]     °[facility]   °[address], [place]     °[phone]       °       °[facility] (women and children)       °[address] °[place], [place] [address] °336-275-0820 °[email or web address] for application and process °Application Required ° °[facility]   °[address]    °[place] [address]     °[phone]       °             °[facility] °[address] °, [place] [address] °336.273.5572 °336-235-0363(schedule application appt.) °Application Required ° °[facility] (women only)    °[address]     °[place], [place] [address]     °336-884-1039      °  Intake starts 6pm daily °Need valid ID, SSC, & Police report °[facility] [place] °[address] °[place], [place] °336-881-5420 °Application Required ° °[facility] (men only)     °[address]      °[place], [place]     °[phone]      ° °[facility] °(Pregnant women only) °[address] °[place], [place] °336-275-0206 ° °[facility]      °[address]      °[place], [place] [address]     °[phone]      °       °[facility] °[address] °[place], [place] °336-723-1848 °90 day commitment/SA/Application process ° °[facility](men only)     °[address]     °[place], [place]     °[phone]       °Check-in at 7pm     °       °[facility] °[address] °[place], [place] [address] °336-248-6684 °Men/Women/Women and Children must be there by 7 pm ° °[facility] °[place], [place] °336-722-8721                ° °

## 2020-06-09 NOTE — ED Notes (Signed)
Patient refused discharge vitals.

## 2020-06-09 NOTE — ED Notes (Signed)
States he hears screeching noising in his brain

## 2020-06-09 NOTE — ED Notes (Signed)
Patient received and taken to room 126. Being seen by TTS.
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
## 2020-06-09 NOTE — ED Notes (Signed)
Pt discharged in no acute distress. Verbalized understanding of all discharge instructions reviewed by RN. Pt accompanied with Peer Support with understanding and agreement to go to [facility] via safe transport. Denies SI/HI at present. Safety maintained.

## 2020-06-09 NOTE — BH Assessment (Signed)
Comprehensive Clinical Assessment (CCA) Note  06/09/2020 Peter Wilson [id number]  Visit Diagnosis:   Cocaine Use Disorder; Cannabis Use Disorder; Schizophrenia  CCA Screening, Triage and Referral (STR)  Patient Reported Information How did you hear about [place]? Self  Referral name: No data recorded Referral phone number: No data recorded  Whom do you see for routine medical problems? Primary Care  Practice/Facility Name: No data recorded Practice/Facility Phone Number: No data recorded Name of Contact: No data recorded Contact Number: No data recorded Contact Fax Number: No data recorded Prescriber Name: No data recorded Prescriber Address (if known): No data recorded  What Is the Reason for Your Visit/Call Today? No data recorded How Long Has This Been Causing You Problems? No data recorded What Do You Feel Would Help You the Most Today? Assessment Only   Have You Recently Been in Any Inpatient Treatment (Hospital/Detox/Crisis Center/28-Day Program)? Yes  Name/Location of Program/Hospital:[facility]  How Long Were You There? 1 day  When Were You Discharged? 06/05/20   Have You Ever Received Services From [employer] Before? Yes  Who Do You See at [facility]? [place], [place] services   Have You Recently Had Any Thoughts About Hurting Yourself? No  Are You Planning to Commit Suicide/Harm Yourself At This time? No   Have you Recently Had Thoughts About [place]? No  Explanation: No data recorded  Have You Used Any Alcohol or Drugs in the Past 24 Hours? Yes  How Long Ago Did You Use Drugs or Alcohol? 0600  What Did You Use and How Much? Cocaine; Marijuana -- unknown quantity   Do You Currently Have a Therapist/Psychiatrist? Yes  Name of Therapist/Psychiatrist: [facility]   Have You Been Recently Discharged From Any Office Practice or Programs? No  Explanation of Discharge From Practice/Program: No data recorded    CCA Screening Triage Referral  Assessment Type of Contact: Face-to-Face  Is this Initial or Reassessment? No data recorded Date Telepsych consult ordered in [facility]:  06/05/20  Time Telepsych consult ordered in [facility]:  0527   Patient Reported Information Reviewed? No data recorded Patient Left Without Being Seen? No data recorded Reason for Not Completing Assessment: Pt somnolent and unable to participate   Collateral Involvement: No data recorded  Does Patient Have a [place]? No data recorded Name and Contact of Legal Guardian: Self  If Minor and Not Living with Parent(s), Who has Custody? n/a  Is CPS involved or ever been involved? Never  Is APS involved or ever been involved? Never   Patient Determined To Be At Risk for Harm To Self or Others Based on Review of Patient Reported Information or Presenting Complaint? No  Method: No data recorded Availability of Means: No data recorded Intent: No data recorded Notification Required: No data recorded Additional Information for Danger to Others Potential: No data recorded Additional Comments for Danger to Others Potential: No data recorded Are There Guns or Other Weapons in Your Home? Yes  Types of Guns/Weapons: "I have guns all over the world"  Are These Weapons Safely Secured?                            Yes (THey are in a locked safe)  Who Could Verify You Are Able To Have These Secured: No data recorded Do You Have any Outstanding Charges, Pending Court Dates, Parole/Probation? No data recorded Contacted To Inform of Risk of Harm To Self or Others: No data  recorded  Location of Assessment: [facility] Assessment Services   Does Patient Present under Involuntary Commitment? No  IVC Papers Initial File Date: No data recorded  [place] of Residence: [place]   Patient Currently Receiving the Following Services: ACTT Psychologist, educational)   Determination of Need: Urgent (48 hours)   Options For Referral: Other: Comment  Engineer, civil (consulting))     CCA Biopsychosocial  Intake/Chief Complaint:     Mental Health Symptoms Depression:  Depression: None  Mania:  Mania: None  Anxiety:   Anxiety: Sleep  Psychosis:  Psychosis: None  Trauma:  Trauma: Hypervigilance  Obsessions:  Obsessions: None  Compulsions:  Compulsions: None  Inattention:  Inattention: None  Hyperactivity/Impulsivity:     Oppositional/Defiant Behaviors:     Emotional Irregularity:  Emotional Irregularity: Transient, stress-related paranoia/disassociation  Other Mood/Personality Symptoms:  Other Mood/Personality Symptoms: Pt stated that he does not feel safe except at hospital   Mental Status Exam Appearance and self-care  Stature:  Stature: Average  Weight:  Weight: Thin  Clothing:  Clothing: Casual  Grooming:  Grooming: Neglected  Cosmetic use:  Cosmetic Use: Age appropriate  Posture/gait:  Posture/Gait: Slumped  Motor activity:     Sensorium  Attention:  Attention: Normal  Concentration:  Concentration: Preoccupied  Orientation:  Orientation: X5  Recall/memory:  Recall/Memory: Defective in Short-term  Affect and Mood  Affect:  Affect: Labile  Mood:  Mood: Other (Comment) (Preoccupied)  Relating  Eye contact:  Eye Contact: Normal  Facial expression:  Facial Expression: Tense  Attitude toward examiner:  Attitude Toward Examiner: Argumentative  Thought and Language  Speech flow: Speech Flow: Normal  Thought content:  Thought Content: Persecutions (''I don't feel safe out there...'')  Preoccupation:  Preoccupations: Other (Comment) (Pt insisted that he wanted to come into the hospital)  Hallucinations:  Hallucinations: Other (Comment) (None indicated; earlier today, Pt endorsed hallucination)  Organization:     Company secretary of Knowledge:  Fund of Knowledge: Fair  Intelligence:  Intelligence: Average  Abstraction:  Abstraction: Functional  Judgement:  Judgement: Poor  Reality Testing:  Reality Testing:  Adequate  Insight:  Insight: Fair  Decision Making:  Decision Making: Only simple  Social Functioning  Social Maturity:  Social Maturity: Irresponsible  Social Judgement:  Social Judgement: "Chief of Staff", Victimized  Stress  Stressors:  Stressors: Other (Comment) (Ongoing substance use)  Coping Ability:  Coping Ability: Exhausted  Skill Deficits:  Skill Deficits: None  Supports:        Religion:    Leisure/Recreation: Leisure / Recreation Do You Have Hobbies?: No  Exercise/Diet: Exercise/Diet Do You Exercise?: No Have You Gained or Lost A Significant Amount of Weight in the Past Six Months?: No Do You Follow a Special Diet?: No Do You Have Any Trouble Sleeping?: No   CCA Employment/Education  Employment/Work Situation: 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 longest time patient has a held a job?: 8 years Where was the patient employed at that time?: 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  Oldest daughter lives  in [place]; son lives in [place].  Childhood History:  Childhood History By whom was/is the patient raised?: Adoptive parents, Grandparents Additional childhood history information: Patient lived alternatively with adopted parents and two grandmothers Description of patient's relationship with caregiver when they were a child: Pt reports that his childhood "wasn't bad wasn't good". Pt reports that his aunt and uncle  adopted him at the agoe of 78. How were you disciplined when you got in trouble as a child/adolescent?: Beaten with cords/pool sticks Did patient suffer any verbal/emotional/physical/sexual abuse as a child?: No Did patient suffer from severe childhood neglect?: No Has patient ever been sexually abused/assaulted/raped as an adolescent or adult?: No Witnessed domestic violence?: No Has patient been affected by domestic violence as an adult?: No  Child/Adolescent Assessment:     CCA Substance Use  Alcohol/Drug Use: Alcohol / Drug Use Pain Medications: See MAR Prescriptions: See MAR Over the Counter: See MAR History of alcohol / drug use?: Yes Longest period of sobriety (when/how long): Unknown Negative Consequences of Use: Financial, Personal relationships, Work / School Substance #1 Name of Substance 1: Cocaine 1 - Amount (size/oz): Varied 1 - Frequency: Daily as available 1 - Duration: Ongoing 1 - Last Use / Amount: 06/09/2020; unknown quanitty Substance #2 Name of Substance 2: Marijuana 2 - Age of First Use: Adolescent 2 - Amount (size/oz): Varied 2 - Frequency: Daily as available 2 - Duration: Ongoing 2 - Last Use / Amount: 06/09/2020                     ASAM's:  Six Dimensions of Multidimensional Assessment  Dimension 1:  Acute Intoxication and/or Withdrawal Potential:      Dimension 2:  Biomedical Conditions and Complications:      Dimension 3:  Emotional, Behavioral, or Cognitive Conditions and Complications:     Dimension 4:  Readiness to Change:     Dimension 5:  Relapse, Continued use, or Continued Problem Potential:     Dimension 6:  Recovery/Living Environment:     ASAM Severity Score:    ASAM Recommended Level of Treatment:     Substance use Disorder (SUD)    Recommendations for Services/Supports/Treatments: Recommendations for Services/Supports/Treatments Recommendations For Services/Supports/Treatments: ACCTT (Assertive Community  Treatment), SAIOP (Substance Abuse Intensive Outpatient Program), Other (Comment) (Specific referral to the [employer])  DSM5 Diagnoses: Patient Active Problem List   Diagnosis Date Noted  . Substance induced mood disorder (HCC) 10/13/2019  . Aggressive behavior   . Tremor 04/24/2018  . Schizophrenia (HCC) 04/01/2018  . Noncompliance 04/10/2017  . Asthma 07/13/2016  . Schizoaffective disorder, bipolar type (HCC) 06/27/2016  . Antisocial traits 09/15/2015  . Tobacco use disorder 09/14/2015  . Cannabis use disorder, moderate, dependence (HCC) 09/14/2015    Patient Centered Plan: Patient is on the following Treatment Plan(s):   Referrals to Alternative Service(s): Referred to Alternative Service(s):   Place:   Date:   Time:    Referred to Alternative Service(s):   Place:   Date:   Time:    Referred to Alternative Service(s):   Place:   Date:   Time:    Referred to Alternative Service(s):   Place:   Date:   Time:     DISPOSITION:  Pt is a 44 year old male who presented to [facility] as a voluntary walk-in with complaint of feeling unsafe following evaluation at [facility] earlier today.  Pt lives in [place] with uncle, and he is on disability.  Pt has been assessed  by TTS two times over the last three days due to various concerns, including paranoia, auditory hallucination, and homicidal ideation.  On both occasions, Pt reported that he was assessed and later discharged in coordination with his ACT Team Producer, television/film/video ACTT).     Pt reported today that he wanted to be admitted because he only felt safe at the hospital.  Pt stated that he worries that people are out to get him.  Pt admitted that since discharged, he used an unknown quantity of cocaine and marijuana.  With Ephriam Knuckles, NP, discussed options for help.  Pt agreed to seek drug treatment to address ongoing substance use and paranoia exacerbated by such use.    Per S. Rankin, NP is psych-cleared.  She is referring Pt to [facility].  Pt is in agreement with this plan.  Dorris Fetch Mustapha Colson

## 2020-06-13 ENCOUNTER — Encounter (HOSPITAL_COMMUNITY): Payer: Self-pay | Admitting: Emergency Medicine

## 2020-06-13 ENCOUNTER — Emergency Department (HOSPITAL_COMMUNITY)
Admission: EM | Admit: 2020-06-13 | Discharge: 2020-06-16 | Disposition: A | Discharge status: Other Acute Inpatient Hospital | Payer: Medicare Other | Attending: Emergency Medicine | Admitting: Emergency Medicine

## 2020-06-13 ENCOUNTER — Ambulatory Visit (HOSPITAL_COMMUNITY)
Admission: EM | Admit: 2020-06-13 | Discharge: 2020-06-13 | Disposition: A | Discharge status: Other Acute Inpatient Hospital | Payer: Medicare Other | Attending: Psychiatry | Admitting: Psychiatry

## 2020-06-13 ENCOUNTER — Other Ambulatory Visit: Payer: Self-pay

## 2020-06-13 DIAGNOSIS — F200 Paranoid schizophrenia: Secondary | ICD-10-CM | POA: Diagnosis not present

## 2020-06-13 DIAGNOSIS — Z20822 Contact with and (suspected) exposure to covid-19: Secondary | ICD-10-CM | POA: Insufficient documentation

## 2020-06-13 DIAGNOSIS — F209 Schizophrenia, unspecified: Secondary | ICD-10-CM

## 2020-06-13 DIAGNOSIS — R45850 Homicidal ideations: Secondary | ICD-10-CM | POA: Diagnosis not present

## 2020-06-13 DIAGNOSIS — R45851 Suicidal ideations: Secondary | ICD-10-CM | POA: Insufficient documentation

## 2020-06-13 DIAGNOSIS — F17210 Nicotine dependence, cigarettes, uncomplicated: Secondary | ICD-10-CM | POA: Insufficient documentation

## 2020-06-13 DIAGNOSIS — R440 Auditory hallucinations: Secondary | ICD-10-CM | POA: Diagnosis not present

## 2020-06-13 DIAGNOSIS — R441 Visual hallucinations: Secondary | ICD-10-CM | POA: Insufficient documentation

## 2020-06-13 DIAGNOSIS — Z046 Encounter for general psychiatric examination, requested by authority: Secondary | ICD-10-CM

## 2020-06-13 LAB — CBC
HCT: 39.8 % (ref 39.0–52.0)
Hemoglobin: 12.1 g/dL — ABNORMAL LOW (ref 13.0–17.0)
MCH: 28.7 pg (ref 26.0–34.0)
MCHC: 30.4 g/dL (ref 30.0–36.0)
MCV: 94.5 fL (ref 80.0–100.0)
Platelets: 329 10*3/uL (ref 150–400)
RBC: 4.21 MIL/uL — ABNORMAL LOW (ref 4.22–5.81)
RDW: 13.6 % (ref 11.5–15.5)
WBC: 5.4 10*3/uL (ref 4.0–10.5)
nRBC: 0 % (ref 0.0–0.2)

## 2020-06-13 LAB — RAPID URINE DRUG SCREEN, HOSP PERFORMED
Amphetamines: NOT DETECTED
Barbiturates: NOT DETECTED
Benzodiazepines: NOT DETECTED
Cocaine: NOT DETECTED
Opiates: NOT DETECTED
Tetrahydrocannabinol: POSITIVE — AB

## 2020-06-13 LAB — COMPREHENSIVE METABOLIC PANEL
ALT: 12 U/L (ref 0–44)
AST: 33 U/L (ref 15–41)
Albumin: 4 g/dL (ref 3.5–5.0)
Alkaline Phosphatase: 60 U/L (ref 38–126)
Anion gap: 10 (ref 5–15)
BUN: 8 mg/dL (ref 6–20)
CO2: 26 mmol/L (ref 22–32)
Calcium: 9.4 mg/dL (ref 8.9–10.3)
Chloride: 103 mmol/L (ref 98–111)
Creatinine, Ser: 0.79 mg/dL (ref 0.61–1.24)
GFR calc Af Amer: >60 mL/min (ref >60)
GFR calc non Af Amer: >60 mL/min (ref >60)
Glucose, Bld: 99 mg/dL (ref 70–99)
Potassium: 4.2 mmol/L (ref 3.5–5.1)
Sodium: 139 mmol/L (ref 135–145)
Total Bilirubin: 0.7 mg/dL (ref 0.3–1.2)
Total Protein: 7 g/dL (ref 6.5–8.1)

## 2020-06-13 LAB — ETHANOL: Alcohol, Ethyl (B): <10 mg/dL (ref <10)

## 2020-06-13 NOTE — BH Assessment (Signed)
Comprehensive Clinical Assessment (CCA) Screening, Triage and Referral Note  06/13/2020 Peter Wilson [id number]  Pt presents unaccompanied to [facility] reporting that people are following him and trying to harm him. Pt has a history of schizophrenia and substance use. He presents paperwork indicating he was discharged today from [facility]. Pt states he was transported straight to [facility] and did not use any substances. He appears to be responding to internal stimuli, yelling at people who are not there and demanding they leave the room. He says he wants to kill the people who are trying to harm him and then kill himself. Pt's medical record indicates a history of aggressive behavior. Pt is emotionally labile: sobbing in tearful, then calm, then angry. He says that his father died today. Pt acknowledges symptoms including crying spells, social withdrawal, loss of interest in usual pleasures, fatigue, irritability, decreased concentration, decreased sleep, decreased appetite and feelings of guilt, worthlessness and hopelessness.   Pt identifies his sister as his primary support. Pt lives in [place] with uncle, and he is on disability. Pt's medical record indicates he is follow by [facility]. Pt appears to be presenting to various area ED and crisis centers.  Pt is disheveled, alert and oriented x4. Pt speaks in a clear tone, at loud volume and normal pace. Motor behavior appears restless with Pt frequently leaving the exam room and walking the halls. Eye contact is good. Pt's mood is depressed, anxious, fearful, angry and affect is labile. Thought process is coherent. Insight and judgment are impaired. Pt is requesting inpatient psychiatric treatment.  Visit Diagnosis:    ICD-10-CM   1. Schizophrenia, unspecified type (HCC)  F20.9    PHQ9 SCORE ONLY 06/13/2020  PHQ-9 Total Score 21   DISPOSITION: GAVE CLINICAL REPORT TO [name], FNP WHO COMPLETED MSE AND AGREED PT MEETS CRITERIA FOR INPATIENT  PSYCHIATRIC TREATMENT. PT WAS PETITIONED FOR INVOLUNTARY COMMITMENT BY THIS WRITER. [name], AC AT [facility], CONFIRMED ADULT UNIT IS AT CAPACITY. TTS CONTACTED [name], RN AT [facility] AND NOTIFIED THAT PT WOULD BE COMING TO [place] [facility] FOR MEDICAL CLEARANCE AND PLACEMENT AT AN INPATIENT PSYCHIATRIC FACILITY.  Patient Reported Information How did you hear about [place]? Self   Referral name: No data recorded  Referral phone number: No data recorded Whom do you see for routine medical problems? Hospital ER   Practice/Facility Name: No data recorded  Practice/Facility Phone Number: No data recorded  Name of Contact: No data recorded  Contact Number: No data recorded  Contact Fax Number: No data recorded  Prescriber Name: No data recorded  Prescriber Address (if known): No data recorded What Is the Reason for Your Visit/Call Today? Hallucinations, homicidal ideation  How Long Has This Been Causing You Problems? > than 6 months  Have You Recently Been in Any Inpatient Treatment (Hospital/Detox/Crisis Center/28-Day Program)? Yes   Name/Location of Program/Hospital:[facility]   How Long Were You There? 24 hours   When Were You Discharged? 06/13/20  Have You Ever Received Services From [employer] Before? Yes   Who Do You See at [facility]? ED visits  Have You Recently Had Any Thoughts About Hurting Yourself? Yes   Are You Planning to Commit Suicide/Harm Yourself At This time?  No  Have you Recently Had Thoughts About Hurting Someone Peter Wilson? Yes   Explanation: No data recorded Have You Used Any Alcohol or Drugs in the Past 24 Hours? No   How Long Ago Did You Use Drugs or Alcohol?  0600  What Did You Use and How Much? Alcohol  What Do You Feel Would Help You the Most Today? Other (Comment) (Inpatient treatment)  Do You Currently Have a Therapist/Psychiatrist? No   Name of Therapist/Psychiatrist: [facility]   Have You Been Recently Discharged From Any Office Practice or  Programs? No   Explanation of Discharge From Practice/Program:  No data recorded    CCA Screening Triage Referral Assessment Type of Contact: Face-to-Face   Is this Initial or Reassessment? No data recorded  Date Telepsych consult ordered in [facility]:  06/05/20   Time Telepsych consult ordered in [facility]:  0527  Patient Reported Information Reviewed? Yes   Patient Left Without Being Seen? No data recorded  Reason for Not Completing Assessment: Pt somnolent and unable to participate  Collateral Involvement: None  Does Patient Have a Court Appointed Legal Guardian? No data recorded  Name and Contact of Legal Guardian:  Self  If Minor and Not Living with Parent(s), Who has Custody? n/a  Is CPS involved or ever been involved? Never  Is APS involved or ever been involved? Never  Patient Determined To Be At Risk for Harm To Self or Others Based on Review of Patient Reported Information or Presenting Complaint? Yes, for Harm to Others   Method: No Plan   Availability of Means: No access or NA   Intent: Intends to cause physical harm but not necessarily death   Notification Required: No need or identified person   Additional Information for Danger to Others Potential:  Active psychosis   Additional Comments for Danger to Others Potential:  Pt has a history of aggressive behavior   Are There Guns or Other Weapons in Your Home?  No    Types of Guns/Weapons: "I have guns all over the world"    Are These Weapons Safely Secured?                              Yes (THey are in a locked safe)    Who Could Verify You Are Able To Have These Secured:    No data recorded Do You Have any Outstanding Charges, Pending Court Dates, Parole/Probation? Pt denies  Contacted To Inform of Risk of Harm To Self or Others: Unable to Contact:  Location of Assessment: [facility] Assessment Services  Does Patient Present under Involuntary Commitment? 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

## 2020-06-13 NOTE — ED Provider Notes (Signed)
Behavioral Health Medical Screening Exam  Peter Wilson is a 44 y.o. male.  Total Time spent with patient: 20 minutes  Psychiatric Specialty Exam  Presentation  General Appearance:Disheveled  Eye Contact:Fair  Speech:Pressured  Speech Volume:Increased  Handedness:Right   Mood and Affect  Mood:Labile  Affect:Flat   Thought Process  Thought Processes:Disorganized;Irrevelant  Descriptions of Associations:Tangential  Orientation:Partial  Thought Content:Paranoid Ideation;Tangential  Hallucinations:Auditory;Visual Patient appears to be responding to internal stimuli. Scans room. Randomly yells and moves around room to avoid what he desribes as a man standing behind him.  Ideas of Reference:Paranoia  Suicidal Thoughts:Yes, Passive Without Plan  Homicidal Thoughts:Yes, Passive Without Plan   Sensorium  Memory:Immediate Poor;Recent Poor  Judgment:Impaired  Insight:Lacking   Executive Functions  Concentration:Fair  Attention Span:Poor  Recall:Fair  Fund of Knowledge:Fair  Language:Fair   Psychomotor Activity  Psychomotor Activity:Restlessness   Assets  Assets:Financial Resources/Insurance   Sleep  Sleep:Poor  Number of hours: No data recorded  Physical Exam: Physical Exam Constitutional:      General: He is not in acute distress.    Appearance: He is not ill-appearing, toxic-appearing or diaphoretic.  HENT:     Right Ear: External ear normal.     Left Ear: External ear normal.  Eyes:     Pupils: Pupils are equal, round, and reactive to light.  Cardiovascular:     Rate and Rhythm: Normal rate.  Pulmonary:     Effort: Pulmonary effort is normal. No respiratory distress.  Musculoskeletal:        General: Normal range of motion.  Skin:    General: Skin is warm and dry.  Neurological:     Mental Status: He is alert and oriented to person, place, and time.  Psychiatric:        Attention and Perception: He perceives auditory and  visual hallucinations.        Mood and Affect: Mood is anxious. Affect is labile.        Behavior: Behavior is cooperative.        Thought Content: Thought content is paranoid. Thought content includes homicidal and suicidal ideation. Thought content does not include homicidal or suicidal plan.    Review of Systems  Constitutional: Negative for chills, diaphoresis, fever, malaise/fatigue and weight loss.  HENT: Negative for congestion.   Cardiovascular: Negative for chest pain and palpitations.  Gastrointestinal: Negative for diarrhea, nausea and vomiting.  Neurological: Negative for dizziness.  Psychiatric/Behavioral: Positive for depression, hallucinations, substance abuse and suicidal ideas. Negative for memory loss. The patient is nervous/anxious and has insomnia.   All other systems reviewed and are negative.  Blood pressure 130/79, pulse 70, temperature 98.7 F (37.1 C), temperature source Oral, resp. rate 20, height 6\' 1"  (1.854 m), weight 163 lb (73.9 kg), SpO2 96 %. Body mass index is 21.51 kg/m.  Musculoskeletal: Strength & Muscle Tone: within normal limits Gait & Station: normal Patient leans: N/A   Recommendations:  Based on my evaluation the patient does not appear to have an emergency medical condition.  TTS discussed patient with , PMHNP-BC who recommended IVC and inpatient treatment.  Nanine Means, NP 06/13/2020, 8:46 PM

## 2020-06-13 NOTE — ED Triage Notes (Signed)
Patient arrived with 2 [employer] officers IVC due to aggresive behavior, auditory and visual hallucinations. Unable to focus at arrival with flight of ideas.

## 2020-06-14 LAB — SARS CORONAVIRUS 2 BY RT PCR (HOSPITAL ORDER, PERFORMED IN ~~LOC~~ HOSPITAL LAB): SARS Coronavirus 2: NEGATIVE

## 2020-06-14 MED ORDER — OLANZAPINE 5 MG PO TABS
5.0000 mg | ORAL_TABLET | Freq: Every day | ORAL | Status: DC
  Administered 2020-06-14 – 2020-06-15 (×2): 5 mg via ORAL
  Filled 2020-06-14 (×3): qty 1

## 2020-06-14 MED ORDER — GABAPENTIN 300 MG PO CAPS
600.0000 mg | ORAL_CAPSULE | Freq: Three times a day (TID) | ORAL | Status: DC
  Administered 2020-06-14 – 2020-06-16 (×6): 600 mg via ORAL
  Filled 2020-06-14 (×6): qty 2

## 2020-06-14 MED ORDER — TEMAZEPAM 15 MG PO CAPS
30.0000 mg | ORAL_CAPSULE | Freq: Every day | ORAL | Status: DC
  Administered 2020-06-14 – 2020-06-16 (×2): 30 mg via ORAL
  Filled 2020-06-14 (×3): qty 2

## 2020-06-14 MED ORDER — RISPERIDONE 3 MG PO TABS
3.0000 mg | ORAL_TABLET | Freq: Every day | ORAL | Status: DC
  Administered 2020-06-14 – 2020-06-16 (×2): 3 mg via ORAL
  Filled 2020-06-14 (×2): qty 1

## 2020-06-14 MED ORDER — CARBAMAZEPINE ER 200 MG PO TB12
400.0000 mg | ORAL_TABLET | Freq: Every day | ORAL | Status: DC
  Administered 2020-06-14 – 2020-06-16 (×2): 400 mg via ORAL
  Filled 2020-06-14 (×3): qty 2

## 2020-06-14 MED ORDER — DIVALPROEX SODIUM 250 MG PO DR TAB
750.0000 mg | DELAYED_RELEASE_TABLET | Freq: Two times a day (BID) | ORAL | Status: DC
  Administered 2020-06-14 – 2020-06-16 (×4): 750 mg via ORAL
  Filled 2020-06-14 (×4): qty 3

## 2020-06-14 MED ORDER — NICOTINE 21 MG/24HR TD PT24
21.0000 mg | MEDICATED_PATCH | Freq: Once | TRANSDERMAL | Status: AC
  Administered 2020-06-14: 21 mg via TRANSDERMAL
  Filled 2020-06-14: qty 1

## 2020-06-14 MED ORDER — CARBAMAZEPINE ER 200 MG PO TB12
200.0000 mg | ORAL_TABLET | Freq: Every day | ORAL | Status: DC
  Administered 2020-06-14 – 2020-06-15 (×2): 200 mg via ORAL
  Filled 2020-06-14 (×3): qty 1
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
## 2020-06-14 NOTE — ED Notes (Signed)
Patients belongings inventoried and placed in locker. No belongings to security.

## 2020-06-14 NOTE — ED Notes (Signed)
Patient is becoming agitated with other manic patient in unit and spoke words toward another patient; Ophelia Charter able to redirect patient into his room and closed door to help keep voices of other patients down; Patient is calm towards staff-Monique,RN

## 2020-06-14 NOTE — ED Notes (Signed)
Lunch Tray Ordered @ 1050. 

## 2020-06-15 NOTE — ED Notes (Signed)
EDP at Bedside now

## 2020-06-15 NOTE — ED Notes (Addendum)
Pt is currently using the phone again to attempt to resolve issues outside of this facility. Pt made aware that no matter the result of his interaction he is to remain calm and cooperative. Pt is agreeable to agreement and was also given crackers and a beverage per pt request.

## 2020-06-15 NOTE — ED Notes (Signed)
This RN called the [facility] Department to inform them of this pts acceptance to another facility. No answer but voice message was left and call will be placed again once pt is near ready for transport.   Pt made aware of placement and called relatives to inform them of placement

## 2020-06-15 NOTE — ED Notes (Signed)
TTS at Bedside 
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
## 2020-06-15 NOTE — ED Notes (Signed)
ED Provider at bedside. 

## 2020-06-15 NOTE — BH Assessment (Signed)
Reassessment:  Patient was assessed on 6/20 at [facility] and sent to [facility] for medical clearance.  Inpatient treatment was recommended, however patient was not referred for treatment as he was not on the TTS consult list for SW to seek placement.    Today, patient continues to meet inpatient criteria.  He is unable to affirm his safety, stating he is fearful he may try to harm himself or others if her were discharged.  He continues to endorse AVH, with command hallucinations to kill people.  He states the voices are silent at the moment, "they are letting me eat."  Patient observed by ED staff to have significant mood lability today, requiring security standby at points.  He admits to arguing with his case manager with [employer] [facility] on the phone.  He states he got upset with her for not finding a housing option for him yet.  With continued AVH and mood instability, patient continues to meet inpt criteria.   Disposition:  Per Assunta Found, NP patient continues to meet inpatient criteria.  [facility] charge RN states patient is accepted to bed 502 and can transfer after 9PM with law enforcement.   Please call report to charge RN at [phone]

## 2020-06-15 NOTE — ED Notes (Addendum)
Pt ambulatory to restroom. Pt worried and requesting for sitter to sit in the room with him. Sitter at bedside. Pt tearful and worried, stated "Please don't let anybody get me."

## 2020-06-15 NOTE — ED Notes (Signed)
Lunch Tray Ordered @ 1048. °

## 2020-06-15 NOTE — ED Notes (Signed)
Per [facility], Pt has been accepted to [facility] and is scheduled to arrive after 2100, 06/15/2020

## 2020-06-15 NOTE — ED Notes (Signed)
This RN called the EDP regarding this pts distress regarding his phone calls. EDP will round on this pt shortly and will assess the situation . Security at bedside in case of escalation. Pt is now in room yelling and crying regarding situation.

## 2020-06-16 ENCOUNTER — Inpatient Hospital Stay (HOSPITAL_COMMUNITY)
Admission: EM | Admit: 2020-06-16 | Discharge: 2020-06-20 | DRG: 885 | Disposition: A | Discharge status: Home / Self Care | Payer: Medicare Other | Source: Intra-hospital | Attending: Psychiatry | Admitting: Psychiatry

## 2020-06-16 ENCOUNTER — Encounter (HOSPITAL_COMMUNITY): Payer: Self-pay | Admitting: Registered Nurse

## 2020-06-16 ENCOUNTER — Other Ambulatory Visit: Payer: Self-pay

## 2020-06-16 DIAGNOSIS — K219 Gastro-esophageal reflux disease without esophagitis: Secondary | ICD-10-CM | POA: Diagnosis present

## 2020-06-16 DIAGNOSIS — F250 Schizoaffective disorder, bipolar type: Secondary | ICD-10-CM | POA: Diagnosis present

## 2020-06-16 DIAGNOSIS — F15951 Other stimulant use, unspecified with stimulant-induced psychotic disorder with hallucinations: Secondary | ICD-10-CM | POA: Diagnosis present

## 2020-06-16 DIAGNOSIS — G43909 Migraine, unspecified, not intractable, without status migrainosus: Secondary | ICD-10-CM | POA: Diagnosis present

## 2020-06-16 DIAGNOSIS — Z79899 Other long term (current) drug therapy: Secondary | ICD-10-CM

## 2020-06-16 DIAGNOSIS — R45850 Homicidal ideations: Secondary | ICD-10-CM | POA: Diagnosis present

## 2020-06-16 DIAGNOSIS — F200 Paranoid schizophrenia: Secondary | ICD-10-CM | POA: Diagnosis not present

## 2020-06-16 DIAGNOSIS — F17210 Nicotine dependence, cigarettes, uncomplicated: Secondary | ICD-10-CM | POA: Diagnosis present

## 2020-06-16 DIAGNOSIS — F1490 Cocaine use, unspecified, uncomplicated: Secondary | ICD-10-CM | POA: Diagnosis present

## 2020-06-16 DIAGNOSIS — G4700 Insomnia, unspecified: Secondary | ICD-10-CM | POA: Diagnosis present

## 2020-06-16 DIAGNOSIS — Z9114 Patient's other noncompliance with medication regimen: Secondary | ICD-10-CM

## 2020-06-16 DIAGNOSIS — Z880 Allergy status to penicillin: Secondary | ICD-10-CM

## 2020-06-16 DIAGNOSIS — R45851 Suicidal ideations: Secondary | ICD-10-CM | POA: Diagnosis present

## 2020-06-16 DIAGNOSIS — R440 Auditory hallucinations: Secondary | ICD-10-CM | POA: Diagnosis present

## 2020-06-16 MED ORDER — ENSURE ENLIVE PO LIQD
237.0000 mL | Freq: Two times a day (BID) | ORAL | Status: DC
  Administered 2020-06-16 – 2020-06-20 (×9): 237 mL via ORAL

## 2020-06-16 MED ORDER — LORAZEPAM 1 MG PO TABS
1.0000 mg | ORAL_TABLET | ORAL | Status: AC | PRN
  Administered 2020-06-16: 1 mg via ORAL
  Filled 2020-06-16: qty 1

## 2020-06-16 MED ORDER — ACETAMINOPHEN 325 MG PO TABS
650.0000 mg | ORAL_TABLET | Freq: Four times a day (QID) | ORAL | Status: DC | PRN
  Administered 2020-06-16 – 2020-06-17 (×2): 650 mg via ORAL
  Filled 2020-06-16 (×2): qty 2

## 2020-06-16 MED ORDER — ACETAMINOPHEN 325 MG PO TABS: 650.0000 mg | ORAL_TABLET | Freq: Four times a day (QID) | ORAL | Status: DC | PRN

## 2020-06-16 MED ORDER — CARBAMAZEPINE 200 MG PO TABS: 200.0000 mg | ORAL_TABLET | Freq: Three times a day (TID) | ORAL | Status: DC

## 2020-06-16 MED ORDER — TRAZODONE HCL 50 MG PO TABS
50.0000 mg | ORAL_TABLET | Freq: Every evening | ORAL | Status: DC | PRN
  Administered 2020-06-16 – 2020-06-17 (×2): 50 mg via ORAL
  Filled 2020-06-16 (×2): qty 1

## 2020-06-16 MED ORDER — ALUM & MAG HYDROXIDE-SIMETH 200-200-20 MG/5ML PO SUSP
30.0000 mL | ORAL | Status: DC | PRN
  Administered 2020-06-16: 30 mL via ORAL

## 2020-06-16 MED ORDER — OLANZAPINE 10 MG PO TBDP
10.0000 mg | ORAL_TABLET | Freq: Three times a day (TID) | ORAL | Status: DC | PRN
  Administered 2020-06-16 – 2020-06-20 (×5): 10 mg via ORAL
  Filled 2020-06-16 (×5): qty 1

## 2020-06-16 MED ORDER — MAGNESIUM HYDROXIDE 400 MG/5ML PO SUSP: 30.0000 mL | Freq: Every day | ORAL | Status: DC | PRN

## 2020-06-16 MED ORDER — ZIPRASIDONE MESYLATE 20 MG IM SOLR
20.0000 mg | INTRAMUSCULAR | Status: AC | PRN
  Administered 2020-06-18: 20 mg via INTRAMUSCULAR
  Filled 2020-06-16: qty 20

## 2020-06-16 MED ORDER — DIVALPROEX SODIUM 500 MG PO DR TAB
500.0000 mg | DELAYED_RELEASE_TABLET | Freq: Two times a day (BID) | ORAL | Status: DC
  Administered 2020-06-16 – 2020-06-20 (×9): 500 mg via ORAL
  Filled 2020-06-16 (×15): qty 1

## 2020-06-16 MED ORDER — CARBAMAZEPINE 200 MG PO TABS
400.0000 mg | ORAL_TABLET | Freq: Every day | ORAL | Status: DC
  Administered 2020-06-16 – 2020-06-17 (×2): 400 mg via ORAL
  Filled 2020-06-16 (×3): qty 2

## 2020-06-16 MED ORDER — OLANZAPINE 10 MG PO TBDP
20.0000 mg | ORAL_TABLET | Freq: Every day | ORAL | Status: DC
  Administered 2020-06-16 – 2020-06-18 (×3): 20 mg via ORAL
  Filled 2020-06-16 (×5): qty 2

## 2020-06-16 MED ORDER — CARBAMAZEPINE 200 MG PO TABS
200.0000 mg | ORAL_TABLET | Freq: Every day | ORAL | Status: DC
  Administered 2020-06-16 – 2020-06-18 (×3): 200 mg via ORAL
  Filled 2020-06-16 (×8): qty 1

## 2020-06-16 NOTE — [facility] Suicide Risk Assessment (Signed)
[facility] INPATIENT:  Family/Significant Other Suicide Prevention Education   Suicide Prevention Education:  Patient Refusal for Family/Significant Other Suicide Prevention Education: The patient Peter Wilson has refused to provide written consent for family/significant other to be provided Family/Significant Other Suicide Prevention Education during admission and/or prior to discharge.  Physician notified.   SPE completed with patient, as patient refused to consent to family contact. SPI pamphlet provided to pt and pt was encouraged to share information with support network, ask questions, and talk about any concerns relating to SPE. Patient denies access to guns/firearms and verbalized understanding of information provided. Mobile Crisis information also provided to patient.    Ruthann Cancer MSW, [employer] Clincal Social Worker  [facility]

## 2020-06-16 NOTE — [facility] Group Notes (Signed)
[name] Group Therapy Notes 06/16/2020 1:52 PM  Type of Therapy and Topic: Group Therapy: Overcoming Obstacles  Participation Level: Active  Description of Group:  In this group patients will be encouraged to explore what they see as obstacles to their own wellness and recovery. They will be guided to discuss their thoughts, feelings, and behaviors related to these obstacles. The group will process together ways to cope with barriers, with attention given to specific choices patients can make. Each patient will be challenged to identify changes they are motivated to make in order to overcome their obstacles. This group will be process-oriented, with patients participating in exploration of their own experiences as well as giving and receiving support and challenge from other group members.  Therapeutic Goals: 1. Patient will identify personal and current obstacles as they relate to admission. 2. Patient will identify barriers that currently interfere with their wellness or overcoming obstacles.  3. Patient will identify feelings, thought process and behaviors related to these barriers. 4. Patient will identify two changes they are willing to make to overcome these obstacles:   Summary of Patient Progress Majour was an active participant in group and eager to share. He shared that "dealing with people," has been an obstacle for him. He feels as though others doubt him and his talents. 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    Therapeutic Modalities:  Cognitive Behavioral Therapy Solution Focused Therapy Motivational Interviewing Relapse Prevention Therapy  Enid Cutter, MSW, [facility] 06/16/2020 1:52 PM
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
## 2020-06-16 NOTE — Progress Notes (Signed)
NUTRITION ASSESSMENT RD working remotely.  Pt identified as at risk on the Malnutrition Screen Tool  INTERVENTION: - continue Ensure Enlive BID, each supplement provides 350 kcal and 20 grams of protein.  NUTRITION DIAGNOSIS: Unintentional weight loss related to sub-optimal intake as evidenced by pt report.   Goal: Pt to meet >/= 90% of their estimated nutrition needs.  Monitor:  PO intake  Assessment:  Patient admitted for schizophrenia. Per chart review, weight today is 161 lb and weight on 6/16 was 169 lb. This indicates 8 lb weight loss (4.7% body weight) in the past 1 week; significant for time frame.   Ensure Enlive was already ordered BID.   44 y.o. male  Height: Ht Readings from Last 1 Encounters:  06/16/20 6\' 1"  (1.854 m)    Weight: Wt Readings from Last 1 Encounters:  06/16/20 73 kg    Weight Hx: Wt Readings from Last 10 Encounters:  06/16/20 73 kg  06/13/20 77.1 kg  06/13/20 73.9 kg  06/09/20 76.7 kg  06/09/20 77.1 kg  05/31/20 80.7 kg  05/31/20 79.4 kg  05/12/20 80.7 kg  05/07/20 77.1 kg  04/11/20 77 kg    BMI:  Body mass index is 21.24 kg/m. Pt meets criteria for normal weight based on current BMI.  Estimated Nutritional Needs: Kcal: 25-30 kcal/kg Protein: > 1 gram protein/kg Fluid: 1 ml/kcal  Diet Order:  Diet Order            Diet regular Room service appropriate? Yes; Fluid consistency: Thin  Diet effective now                Pt is also offered choice of unit snacks mid-morning and mid-afternoon.  Pt is eating as desired.   Lab results and medications reviewed.     04/13/20, MS, RD, LDN, CNSC Inpatient Clinical Dietitian RD pager # available in [facility]  After hours/weekend pager # available in [facility]

## 2020-06-16 NOTE — Tx Team (Signed)
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 acetaminophen (TYLENOL) tablet 650 mg  650 mg Oral Q6H PRN Sharma Covert, MD      . alum & mag hydroxide-simeth (MAALOX/MYLANTA) 200-200-20 MG/5ML suspension 30 mL  30 mL Oral Q4H PRN Sharma Covert, MD      . divalproex (DEPAKOTE) DR tablet 500 mg  500 mg Oral Q12H Clary, Cordie Grice, MD      . feeding supplement (ENSURE ENLIVE) (ENSURE ENLIVE) liquid 237 mL  237 mL Oral BID BM Anike, Adaku C, NP      . OLANZapine zydis (ZYPREXA) disintegrating tablet 10 mg  10 mg Oral Q8H PRN Sharma Covert, MD       And  . LORazepam (ATIVAN) tablet 1 mg  1 mg Oral PRN Sharma Covert, MD       And  . ziprasidone (GEODON) injection 20 mg  20 mg Intramuscular PRN Sharma Covert, MD      . magnesium hydroxide (MILK OF MAGNESIA) suspension 30 mL  30 mL Oral Daily PRN Sharma Covert, MD      . OLANZapine zydis (ZYPREXA) disintegrating tablet 20 mg  20 mg Oral QHS Sharma Covert, MD      . traZODone (DESYREL) tablet 50 mg  50 mg Oral QHS PRN Sharma Covert, MD       PTA Medications: Medications Prior to Admission  Medication Sig Dispense Refill Last Dose  . benztropine (COGENTIN) 1 MG tablet Take 1 tablet (1 mg total) by mouth 2 (two) times daily. 60 tablet 0   . carbamazepine (TEGRETOL XR) 200 MG 12 hr tablet Take 200-400 mg by mouth 2 (two) times daily. Takes 1 tablet (2100m) daily, and takes 2 tablets (401m every night at bedtime     . clonazePAM (KLONOPIN) 0.5 MG tablet Take 0.5 mg by mouth 2 (two)  times daily as needed for anxiety.      . divalproex (DEPAKOTE) 250 MG DR tablet Take 750 mg by mouth 2 (two) times daily.      . Marland Kitchenabapentin (NEURONTIN) 300 MG capsule Take 600 mg by mouth 3 (three) times daily.      . haloperidol decanoate (HALDOL DECANOATE) 100 MG/ML injection Inject 100 mg into the muscle every 28 (twenty-eight) days.      . Marland KitchenLANZapine (ZYPREXA) 5 MG tablet Take 1 tablet (5 mg total) by mouth in the morning and at bedtime. 30 tablet 2   . risperiDONE (RISPERDAL) 3 MG tablet Take 3 mg by mouth at bedtime.     . temazepam (RESTORIL) 30 MG capsule Take 30 mg by mouth at bedtime.       Patient Stressors: Marital or family conflict Medication change or noncompliance Substance abuse  Patient Strengths: GeTechnical sales engineeror treatment/growth  Treatment Modalities: Medication Management, Group therapy, Case management,  1 to 1 session with clinician, Psychoeducation, Recreational therapy.   Physician Treatment Plan for Primary Diagnosis: <principal problem not specified> Long Term Goal(s):     Short Term Goals:    Medication Management: Evaluate patient's response, side effects, and tolerance of medication regimen.  Therapeutic Interventions: 1 to 1  sessions, Unit Group sessions and Medication administration.  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  Therapeutic Interventions: 1 to 1 sessions, Unit Group sessions and Medication administration.  Evaluation of Outcomes: Not Met   RN Treatment Plan for Primary Diagnosis: <principal problem not specified> Long Term Goal(s): Knowledge of disease and therapeutic  regimen to maintain health will improve  Short Term Goals: Ability to remain free from injury will improve, Ability to participate in decision making will improve, Ability to identify and develop effective coping behaviors will improve and Compliance with prescribed medications will improve  Medication Management: RN will administer medications as ordered by provider, will assess and evaluate patient's response and provide education to patient for prescribed medication. RN will report any adverse and/or side effects to prescribing provider.  Therapeutic Interventions: 1 on 1 counseling sessions, Psychoeducation, Medication administration, Evaluate responses to treatment, Monitor vital signs and CBGs as ordered, Perform/monitor CIWA, COWS, AIMS and Fall Risk screenings as ordered, Perform wound care treatments as ordered.  Evaluation of Outcomes: Not Met   [name] Treatment Plan for Primary Diagnosis: <principal problem not specified> Long Term Goal(s): Safe transition to appropriate next level of care at discharge, Engage patient in therapeutic group addressing interpersonal concerns.  Short Term Goals: Engage patient in aftercare planning with referrals and resources, Increase social support, Identify triggers associated with mental health/substance abuse issues and Increase skills for wellness and recovery  Therapeutic Interventions: Assess for all discharge needs, 1 to 1 time with Social worker, Explore available resources and support systems, Assess for adequacy in community support network, Educate family and significant other(s) on suicide prevention, Complete Psychosocial Assessment, Interpersonal group therapy.  Evaluation of Outcomes: Not Met   Progress in Treatment: Attending groups: No. Participating in groups: No. Taking medication as prescribed: Yes. Toleration medication: Yes. Family/Significant other contact made: No, will contact:  if consent is given. Patient understands  diagnosis: Yes. Discussing patient identified problems/goals with staff: Yes. Medical problems stabilized or resolved: Yes. Denies suicidal/homicidal ideation: Yes. Issues/concerns per patient self-inventory: No. Other: none  New problem(s) identified: No, Describe:  CSW will continue to assess.  New Short Term/Long Term Goal(s): medication stabilization, elimination of SI thoughts, development of comprehensive mental wellness plan.   Patient Goals:    Discharge Plan or Barriers: Patient recently admitted. CSW will continue to follow and assess for appropriate referrals and possible discharge planning.   Reason for Continuation of Hospitalization: Delusions  Depression Hallucinations Medication stabilization  Estimated Length of Stay: 3-5 days   Attendees: Patient: 06/16/2020   Physician:  06/16/2020   Nursing:  06/16/2020   RN Care Manager: 06/16/2020   Social Worker: Darletta Moll, Latanya Presser 06/16/2020   Recreational Therapist:  06/16/2020   Other:  06/16/2020   Other:  06/16/2020   Other: 06/16/2020     Scribe for Treatment Team: Vassie Moselle, [place] 06/16/2020 10:03 AM

## 2020-06-16 NOTE — Tx Team (Signed)
Initial Treatment Plan 06/16/2020 5:04 AM [name] [id number]    PATIENT STRESSORS: Marital or family conflict Medication change or noncompliance Substance abuse   PATIENT STRENGTHS: General fund of knowledge Motivation for treatment/growth   PATIENT IDENTIFIED PROBLEMS: Psychosis  "my attitude"  depression                 DISCHARGE CRITERIA:  Adequate post-discharge living arrangements Improved stabilization in mood, thinking, and/or behavior Verbal commitment to aftercare and medication compliance  PRELIMINARY DISCHARGE PLAN: Attend PHP/IOP Outpatient therapy  PATIENT/FAMILY INVOLVEMENT: This treatment plan has been presented to and reviewed with the patient, Peter Wilson.  The patient and family have been given the opportunity to ask questions and make suggestions.  Delos Haring, RN 06/16/2020, 5:04 AM
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
## 2020-06-16 NOTE — [facility] Group Notes (Signed)
Adult Psychoeducational Group Note  Date:  06/16/2020  Time:  9:24 PM  Group Topic/Focus:  Wrap-Up Group:   The focus of this group is to help patients review their daily goal of treatment and discuss progress on daily workbooks.  Participation Level:  Active  Participation Quality:  Attentive  Affect:  Appropriate  Cognitive:  Appropriate  Insight: Improving  Engagement in Group:  Engaged  Modes of Intervention:  Education  Additional Comments:    Jacalyn Lefevre 06/16/2020, 9:24 PM
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
## 2020-06-16 NOTE — Progress Notes (Signed)
Recreation Therapy Notes  Patient admitted to unit .6.23.21.  Due to admission within last year, no new assessment conducted at this time. Last assessment conducted 10.30.20. Patient reports drugs are a current stressor.  Pt stated reason for admission was being back on drugs.  Pt identified coping skill as substance abuse and leisure interest as drugs. Pt reported doing drugs on a daily basis.  Pt currently resides in [facility] [place] [facility] and a car is the main form of transportation.  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  Areas of improvement identified as picking friends and dealing with the wrong people.    Patient denies SI, HI, AVH at this time. Patient reports goal of "getting my own place".     Caroll Rancher, LRT/CTRS   Caroll Rancher A 06/16/2020 1:56 PM

## 2020-06-16 NOTE — Progress Notes (Signed)
Pt visible on the unit, pt hypo-verbal but redirectable.     06/16/20 2000  Psych Admission Type (Psych Patients Only)  Admission Status Involuntary  Psychosocial Assessment  Patient Complaints Anxiety;Depression  Eye Contact Fair  Facial Expression Animated;Anxious;Pensive;Sullen;Sad;Worried  Affect Anxious;Depressed;Sad;Sullen  Speech Slow;Slurred  Interaction Assertive;Hypervigilant  Motor Activity Pacing;Restless;Slow  Appearance/Hygiene Disheveled;Poor hygiene  Behavior Characteristics Cooperative  Mood Depressed;Labile;Pleasant  Thought Process  Coherency Circumstantial  Content Blaming others;Hypochondria  Delusions Persecutory  Perception Hallucinations  Hallucination Auditory  Judgment Limited  Confusion Mild  Danger to Self  Current suicidal ideation? Denies  Danger to Others  Danger to Others None reported or observed

## 2020-06-16 NOTE — Plan of Care (Signed)
Progress note  D: pt found in bed; allowed to rest given their late admit time. Upon awakening, pt was compliant with medication administration. Pt presented animated and anxious. Pt expresses feeling of sadness and despair. Pt states they need to get their social security benefits restarted. Pt still has complaints of insomnia. Pt is pleasant. Pt denies si/hi/ah/vh and verbally agrees to approach staff if these become apparent or before harming themself/others while at [facility]. Pt does have a hx of auditory hallucinations, but denies these at this time. A: Pt provided support and encouragement. Pt given medication per protocol and standing orders. Q68m safety checks implemented and continued.  R: Pt safe on the unit. Will continue to monitor.  Pt progressing in the following metrics Problem: Education: Goal: Knowledge of [place] [facility] Education information/materials will improve Outcome: Progressing Goal: Emotional status will improve Outcome: Progressing Goal: Verbalization of understanding the information provided will improve Outcome: Progressing   Problem: Activity: Goal: Sleeping patterns will improve Outcome: Progressing
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
## 2020-06-17 NOTE — [facility] Group Notes (Signed)
Pt did not attend group was not feeling well.

## 2020-06-17 NOTE — Progress Notes (Signed)
Recreation Therapy Notes  Date: 6.24.21 Time: 0945 Location: [address] Dayroom  Group Topic: Self-Esteem  Goal Area(s) Addresses:  Patient will successfully identify positive attributes about themselves.  Patient will successfully identify benefit of improved self-esteem.   Behavioral Response: Engaged  Intervention: Blank crest, markers, colored pencils, music  Activity: Crest of Arms.  Patients were given a blank crest.  In that crest, they were to identify four positive things that helped to make them who they are.  Patients could identify positive qualities, accomplishments, important dates, important people, etc.  LRT also played music requested by patients as they worked.  Education:  Self-Esteem, Building control surveyor.   Education Outcome: Acknowledges education/In group clarification offered/Needs additional education  Clinical Observations/Feedback: Pt was attentive and engaged in group.  Pt identified his music and money as the only things that were important to him.  Pt was social with LRT talking about some of the things he has been through and how he could relate to the music that was played in group.    Caroll Rancher, LRT/CTRS    Caroll Rancher A 06/17/2020 10:28 AM

## 2020-06-17 NOTE — B[facility] Counselor (Addendum)
CSW spoke with York Grice with [facility] regarding housing for this patient. Mariann Laster was able to email housing applications to be completed by this patient.   CSW met with this patient and assisted him in completing the applications and faxing them to his [facility].    Darletta Moll MSW, [place] Worker  [facility]

## 2020-06-17 NOTE — Plan of Care (Signed)
Progress note  D: pt found in bed; allowed to rest. Upon awakening, pt was compliant with medication administration. Pt states they feel groggy this morning but have no other physical complaints. Pt feels they are "gaining their weight back" with proper nutrition. Pt is animated about their benefits card being received by the proper channel. Pt viewed in the dayroom interacting appropriately. Pt denies si/hi/ah/vh and verbally agrees to approach staff if these become apparent or before harming themself/others while at bhh.  A: Pt provided support and encouragement. Pt given medication per protocol and standing orders. Q14m safety checks implemented and continued.  R: Pt safe on the unit. Will continue to monitor.  Pt progressing in the following metrics  Problem: Education: Goal: Mental status will improve Outcome: Progressing   Problem: Activity: Goal: Interest or engagement in activities will improve Outcome: Progressing   Problem: Coping: Goal: Ability to verbalize frustrations and anger appropriately will improve Outcome: Progressing Goal: Ability to demonstrate self-control will improve Outcome: Progressing

## 2020-06-17 NOTE — Progress Notes (Signed)
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  Objective: Patient is seen and examined.  Patient is a 44 year old male with a past psychiatric history as stated above.  He is seen in follow-up.  He is beginning to feel a bit better.  His irritability has decreased, and his speech rate has decreased as well as its volume.  He denied suicidal or homicidal ideation.  He discussed his longstanding drug use, and how he feels unable to control that.  He is attempting to extend his psychiatric hospitalization.  He stated that his check does not come in until next Thursday.  His speech is a bit slurred today, and does appear somewhat overmedicated.  His vital signs are stable, he is afebrile.  He slept 7.75 hours last night.  EKG showed a normal sinus rhythm with a normal QTc interval.  Principal Problem: <principal problem not specified> Diagnosis: Active Problems:   Schizoaffective disorder, bipolar type (HCC)   Amphetamine and psychostimulant-induced psychosis with hallucinations (HCC)  Total Time spent with patient: 20 minutes  Past Psychiatric History: See admission H&P  Past Medical History:  Past Medical History:  Diagnosis Date   Marijuana abuse    Schizophrenia, paranoid (HCC)    Tardive dyskinesia    Tobacco abuse     Past Surgical History:  Procedure Laterality Date   NO PAST SURGERIES     Family History: History reviewed. No pertinent family history. Family Psychiatric  History: See admission H&P Social History:  Social History   Substance and Sexual Activity  Alcohol Use Yes   Alcohol/week: 0.0 standard drinks   Comment: Unknown       Social History   Substance and Sexual Activity  Drug Use Yes   Frequency: 7.0 times per week   Types: Marijuana, Cocaine   Comment: Unknown     Social History   Socioeconomic History   Marital status: Legally Separated    Spouse name: Not on file   Number of children: Not on file   Years of education: Not on file   Highest education level: Not on file  Occupational History   Occupation: Disability  Tobacco Use   Smoking status: Current Every Day Smoker    Packs/day: 2.00    Types: Cigarettes   Smokeless tobacco: Never Used   Tobacco comment: Unknown   Substance and Sexual Activity   Alcohol use: Yes    Alcohol/week: 0.0 standard drinks    Comment: Unknown    Drug use: Yes    Frequency: 7.0 times per week    Types: Marijuana, Cocaine    Comment: Unknown    Sexual activity: Not on file  Other Topics Concern   Not on file  Social History Narrative   Pt discharged from [facility] on 10/10/2019, was assigned to a hotel in [place] by [facility] [place], [place].     Social Determinants of Health   Financial Resource Strain:    Difficulty of Paying Living Expenses:   Food Insecurity:    Worried About Programme researcher, broadcasting/film/video in the Last Year:    Barista in the Last Year:   Transportation Needs:    Freight forwarder (Medical):  Lack of Transportation (Non-Medical):   Physical Activity:    Days of Exercise per Week:    Minutes of Exercise per Session:   Stress:    Feeling of Stress :   Social Connections:    Frequency of Communication with Friends and Family:    Frequency of Social Gatherings with Friends and Family:    Attends Religious Services:    Active Member of Clubs or Organizations:    Attends Archivist Meetings:    Marital Status:    Additional Social History:                         Sleep: Good  Appetite:  Good  Current Medications: Current  Facility-Administered Medications  Medication Dose Route Frequency Provider Last Rate Last Admin   acetaminophen (TYLENOL) tablet 650 mg  650 mg Oral Q6H PRN Sharma Covert, MD   650 mg at 06/16/20 1159   alum & mag hydroxide-simeth (MAALOX/MYLANTA) 200-200-20 MG/5ML suspension 30 mL  30 mL Oral Q4H PRN Sharma Covert, MD   30 mL at 06/16/20 1240   carbamazepine (TEGRETOL) tablet 200 mg  200 mg Oral Daily Sharma Covert, MD   200 mg at 06/17/20 0900   carbamazepine (TEGRETOL) tablet 400 mg  400 mg Oral QHS Sharma Covert, MD   400 mg at 06/16/20 2053   divalproex (DEPAKOTE) DR tablet 500 mg  500 mg Oral Q12H Sharma Covert, MD   500 mg at 06/17/20 0900   feeding supplement (ENSURE ENLIVE) (ENSURE ENLIVE) liquid 237 mL  237 mL Oral BID BM Anike, Adaku C, NP   237 mL at 06/17/20 0924   magnesium hydroxide (MILK OF MAGNESIA) suspension 30 mL  30 mL Oral Daily PRN Sharma Covert, MD       OLANZapine zydis (ZYPREXA) disintegrating tablet 10 mg  10 mg Oral Q8H PRN Sharma Covert, MD   10 mg at 06/17/20 0900   And   ziprasidone (GEODON) injection 20 mg  20 mg Intramuscular PRN Sharma Covert, MD       OLANZapine zydis (ZYPREXA) disintegrating tablet 20 mg  20 mg Oral QHS Sharma Covert, MD   20 mg at 06/16/20 2053   traZODone (DESYREL) tablet 50 mg  50 mg Oral QHS PRN Sharma Covert, MD   50 mg at 06/16/20 2053    Lab Results: No results found for this or any previous visit (from the past 64 hour(s)).  Blood Alcohol level:  Lab Results  Component Value Date   ETH <10 06/13/2020   ETH <10 30/16/0109    Metabolic Disorder Labs: Lab Results  Component Value Date   HGBA1C 4.6 (L) 04/02/2018   MPG 85.32 04/02/2018   MPG 91 04/11/2017   Lab Results  Component Value Date   PROLACTIN 28.0 (H) 11/06/2019   PROLACTIN 26.4 (H) 04/11/2017   Lab Results  Component Value Date   CHOL 202 (H) 04/02/2018   TRIG 102 04/02/2018   HDL 46 04/02/2018    CHOLHDL 4.4 04/02/2018   VLDL 20 04/02/2018   LDLCALC 136 (H) 04/02/2018   LDLCALC 167 (H) 04/11/2017    Physical Findings: AIMS:  , ,  ,  ,    CIWA:    COWS:     Musculoskeletal: Strength & Muscle Tone: within normal limits Gait & Station: shuffle Patient leans: N/A  Psychiatric Specialty Exam: Physical Exam  Nursing note and vitals reviewed.  Constitutional: He is oriented to person, place, and time.  HENT:  Head: Normocephalic and atraumatic.  Respiratory: Effort normal.  GI: Normal appearance.  Neurological: He is alert and oriented to person, place, and time.    Review of Systems  Blood pressure (!) 114/93, pulse 81, temperature 98 F (36.7 C), temperature source Oral, resp. rate 18, height 6\' 1"  (1.854 m), weight 73 kg, SpO2 100 %.Body mass index is 21.24 kg/m.  General Appearance: Disheveled  Eye Contact:  Fair  Speech:  Normal Rate  Volume:  Decreased  Mood:  Dysphoric  Affect:  Congruent  Thought Process:  Coherent and Descriptions of Associations: Circumstantial  Orientation:  Full (Time, Place, and Person)  Thought Content:  Logical  Suicidal Thoughts:  No  Homicidal Thoughts:  No  Memory:  Immediate;   Fair Recent;   Fair Remote;   Fair  Judgement:  Intact  Insight:  Lacking  Psychomotor Activity:  Decreased  Concentration:  Concentration: Fair and Attention Span: Fair  Recall:  of Knowledge:  Fair  Language:  Fair  Akathisia:  Negative  Handed:  Right  AIMS (if indicated):     Assets:  Desire for Improvement Resilience  ADL's:  Intact  Cognition:  WNL  Sleep:  Number of Hours: 7.75     Treatment Plan Summary: Daily contact with patient to assess and evaluate symptoms and progress in treatment, Medication management and Plan : Patient is seen and examined.  Patient is a 44 year old male with the above-stated past psychiatric history who is seen in follow-up.   Diagnosis: 1.  Schizoaffective disorder; bipolar type 2.  History of  amphetamine and psychostimulant use disorders 3.  Cannabis use disorder  Pertinent findings on examination today: 1.  Decreased irritability. 2.  Decreased agitation. 3.  Improved sleep. 4.  Mildly slurred speech 5.  No suicidal ideation 6.  Decreased auditory and visual hallucinations.  Plan: 1.  Decrease Zyprexa to 15 mg p.o. nightly for mood stability and psychosis. 2.  Continue Tegretol 200 mg p.o. daily and 400 mg p.o. nightly for mood stability. 3.  Continue Depakote DR 500 mg p.o. every 12 hours for mood stability. 4.  Add Protonix 40 mg p.o. daily for reflux. 5.  Continue trazodone 50 mg p.o. nightly as needed insomnia. 6.  Depakote level, Tegretol level, CBC with differential and liver function enzymes on 06/19/2020. 7.  Disposition planning-in progress.  06/21/2020, MD 06/17/2020, 11:14 AM

## 2020-06-17 NOTE — Progress Notes (Signed)
Pt continues to be hyper verbal and pleasant    06/17/20 2000  Psych Admission Type (Psych Patients Only)  Admission Status Involuntary  Psychosocial Assessment  Patient Complaints Anxiety  Eye Contact Fair  Facial Expression Animated;Anxious  Affect Anxious;Appropriate to circumstance  Speech Slow;Slurred  Interaction Assertive;Attention-seeking;Hypervigilant  Motor Activity Pacing;Slow  Appearance/Hygiene Disheveled;Poor hygiene  Behavior Characteristics Cooperative;Restless;Anxious  Mood Anxious  Thought Process  Coherency Circumstantial  Content Blaming others;Magical thinking;Obsessions  Delusions Grandeur;Persecutory  Perception Hallucinations  Hallucination Auditory  Judgment Limited  Confusion None  Danger to Self  Current suicidal ideation? Denies  Danger to Others  Danger to Others None reported or observed

## 2020-06-18 ENCOUNTER — Ambulatory Visit (HOSPITAL_COMMUNITY)
Admit: 2020-06-18 | Discharge: 2020-06-18 | Disposition: A | Discharge status: Home / Self Care | Payer: Medicare Other | Attending: Psychiatry | Admitting: Psychiatry

## 2020-06-18 MED ORDER — TRAMADOL HCL 50 MG PO TABS: 50.0000 mg | ORAL_TABLET | Freq: Four times a day (QID) | ORAL | Status: DC | PRN

## 2020-06-18 MED ORDER — CARBAMAZEPINE 200 MG PO TABS
200.0000 mg | ORAL_TABLET | Freq: Every day | ORAL | Status: DC
  Filled 2020-06-18 (×3): qty 1

## 2020-06-18 MED ORDER — SUMATRIPTAN SUCCINATE 50 MG PO TABS
50.0000 mg | ORAL_TABLET | ORAL | Status: DC | PRN
  Filled 2020-06-18: qty 1

## 2020-06-18 MED ORDER — DOXEPIN HCL 25 MG PO CAPS
25.0000 mg | ORAL_CAPSULE | Freq: Every evening | ORAL | Status: DC | PRN
  Filled 2020-06-18: qty 1

## 2020-06-18 NOTE — Progress Notes (Signed)
   06/18/20 2020  COVID-19 Daily Checkoff  Have you had a fever (temp > 37.80C/100F)  in the past 24 hours?  No  If you have had runny nose, nasal congestion, sneezing in the past 24 hours, has it worsened? No  COVID-19 EXPOSURE  Have you traveled outside the state in the past 14 days? No  Have you been in contact with someone with a confirmed diagnosis of COVID-19 or PUI in the past 14 days without wearing appropriate PPE? No  Have you been living in the same home as a person with confirmed diagnosis of COVID-19 or a PUI (household contact)? No  Have you been diagnosed with COVID-19? No

## 2020-06-18 NOTE — B[facility] Counselor (Signed)
CSW provided a list of [facility] for this patient to call regarding possible bed availability.    Ruthann Cancer MSW, [employer] Clincal Social Worker  [facility]

## 2020-06-18 NOTE — Plan of Care (Signed)
  Problem: Education: Goal: Knowledge of [facility] Education information/materials will improve Outcome: Progressing Goal: Emotional status will improve Outcome: Progressing Goal: Mental status will improve Outcome: Progressing Goal: Verbalization of understanding the information provided will improve Outcome: Progressing   Problem: Activity: Goal: Interest or engagement in activities will improve Outcome: Progressing Goal: Sleeping patterns will improve Outcome: Progressing   Problem: Coping: Goal: Ability to verbalize frustrations and anger appropriately will improve Outcome: Progressing Goal: Ability to demonstrate self-control will improve Outcome: Progressing   Problem: Health Behavior/Discharge Planning: Goal: Identification of resources available to assist in meeting health care needs will improve Outcome: Progressing Goal: Compliance with treatment plan for underlying cause of condition will improve Outcome: Progressing   Problem: Physical Regulation: Goal: Ability to maintain clinical measurements within normal limits will improve Outcome: Progressing   Problem: Safety: Goal: Periods of time without injury will increase Outcome: Progressing   Problem: Activity: Goal: Will verbalize the importance of balancing activity with adequate rest periods Outcome: Progressing   Problem: Education: Goal: Will be free of psychotic symptoms Outcome: Progressing Goal: Knowledge of the prescribed therapeutic regimen will improve Outcome: Progressing   Problem: Coping: Goal: Coping ability will improve Outcome: Progressing Goal: Will verbalize feelings Outcome: Progressing   Problem: Health Behavior/Discharge Planning: Goal: Compliance with prescribed medication regimen will improve Outcome: Progressing   Problem: Nutritional: Goal: Ability to achieve adequate nutritional intake will improve Outcome: Progressing   Problem: Role Relationship: Goal:  Ability to communicate needs accurately will improve Outcome: Progressing Goal: Ability to interact with others will improve Outcome: Progressing   Problem: Safety: Goal: Ability to redirect hostility and anger into socially appropriate behaviors will improve Outcome: Progressing Goal: Ability to remain free from injury will improve Outcome: Progressing   Problem: Self-Care: Goal: Ability to participate in self-care as condition permits will improve Outcome: Progressing   Problem: Self-Concept: Goal: Will verbalize positive feelings about self Outcome: Progressing  Patient denies SI, HI and AVH this shift.  Patient reports "feeling rough" from his night time medications.  Patient reported feeling as if the room was spinning and stated that he felt he needed to lay down.  Assess patient noted vital signs to be wnl.  Continue to monitor per individualized treatment plan.

## 2020-06-18 NOTE — Progress Notes (Signed)
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  Objective: Patient is seen and examined.  Patient is a 44 year old male with the above-stated past psychiatric history who is seen in follow-up.  Patient stated that he got up at 4 AM this morning, was dizzy and had a fall.  He stated he hit the doorknob on the back of his head.  He complains of a headache today.  He denied any auditory or visual hallucinations.  He denied any suicidal or homicidal ideation.  He denied any side effects to his current medications.  He spoke with social work today regarding housing at [employer].  His vital signs are stable, he is afebrile.  He slept 9.5 hours last night.  His speech was slurred yesterday, but his medications were reduced.  The speech pattern is better today.  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 No pertinent family history. Family Psychiatric  History: See admission H&P Social History:  Social History   Substance and Sexual Activity  Alcohol Use Yes   Alcohol/week: 0.0 standard drinks   Comment: Unknown      Social History    Substance and Sexual Activity  Drug Use Yes   Frequency: 7.0 times per week   Types: Marijuana, Cocaine   Comment: Unknown     Social History   Socioeconomic History   Marital status: Legally Separated    Spouse name: Not on file   Number of children: Not on file   Years of education: Not on file   Highest education level: Not on file  Occupational History   Occupation: Disability  Tobacco Use   Smoking status: Current Every Day Smoker    Packs/day: 2.00    Types: Cigarettes   Smokeless tobacco: Never Used   Tobacco comment: Unknown   Substance and Sexual Activity   Alcohol use: Yes    Alcohol/week: 0.0 standard drinks    Comment: Unknown    Drug use: Yes    Frequency: 7.0 times per week    Types: Marijuana, Cocaine    Comment: Unknown    Sexual activity: Not on file  Other Topics Concern   Not on file  Social History Narrative   Pt discharged from [facility] on 10/10/2019, was assigned to a hotel in [place] by his [place] provider, [facility] in [place], [place].     Social Determinants of Health   Financial Resource Strain:    Difficulty of Paying Living Expenses:   Food Insecurity:    Worried About Charity fundraiser in the Last Year:    Arboriculturist in the Last Year:   Transportation Needs:    Lack of Transportation (Medical):    Lack of  Transportation (Non-Medical):   Physical Activity:    Days of Exercise per Week:    Minutes of Exercise per Session:   Stress:    Feeling of Stress :   Social Connections:    Frequency of Communication with Friends and Family:    Frequency of Social Gatherings with Friends and Family:    Attends Religious Services:    Active Member of Clubs or Organizations:    Attends Banker Meetings:    Marital Status:    Additional Social History:                         Sleep: Good  Appetite:  Good  Current Medications: Current Facility-Administered Medications   Medication Dose Route Frequency Provider Last Rate Last Admin   acetaminophen (TYLENOL) tablet 650 mg  650 mg Oral Q6H PRN Antonieta Pert, MD   650 mg at 06/17/20 2044   alum & mag hydroxide-simeth (MAALOX/MYLANTA) 200-200-20 MG/5ML suspension 30 mL  30 mL Oral Q4H PRN Antonieta Pert, MD   30 mL at 06/16/20 1240   carbamazepine (TEGRETOL) tablet 200 mg  200 mg Oral Daily Antonieta Pert, MD   200 mg at 06/18/20 0749   carbamazepine (TEGRETOL) tablet 200 mg  200 mg Oral QHS Antonieta Pert, MD       divalproex (DEPAKOTE) DR tablet 500 mg  500 mg Oral Q12H Antonieta Pert, MD   500 mg at 06/18/20 0749   feeding supplement (ENSURE ENLIVE) (ENSURE ENLIVE) liquid 237 mL  237 mL Oral BID BM Anike, Adaku C, NP   237 mL at 06/18/20 0933   magnesium hydroxide (MILK OF MAGNESIA) suspension 30 mL  30 mL Oral Daily PRN Antonieta Pert, MD       OLANZapine zydis (ZYPREXA) disintegrating tablet 10 mg  10 mg Oral Q8H PRN Antonieta Pert, MD   10 mg at 06/17/20 0900   And   ziprasidone (GEODON) injection 20 mg  20 mg Intramuscular PRN Antonieta Pert, MD       OLANZapine zydis (ZYPREXA) disintegrating tablet 20 mg  20 mg Oral QHS Antonieta Pert, MD   20 mg at 06/17/20 2043   SUMAtriptan (IMITREX) tablet 50 mg  50 mg Oral Q2H PRN Antonieta Pert, MD       traMADol Janean Sark) tablet 50 mg  50 mg Oral Q6H PRN Antonieta Pert, MD       traZODone (DESYREL) tablet 50 mg  50 mg Oral QHS PRN Antonieta Pert, MD   50 mg at 06/17/20 2042    Lab Results: No results found for this or any previous visit (from the past 48 hour(s)).  Blood Alcohol level:  Lab Results  Component Value Date   ETH <10 06/13/2020   ETH <10 06/08/2020    Metabolic Disorder Labs: Lab Results  Component Value Date   HGBA1C 4.6 (L) 04/02/2018   MPG 85.32 04/02/2018   MPG 91 04/11/2017   Lab Results  Component Value Date   PROLACTIN 28.0 (H) 11/06/2019   PROLACTIN 26.4 (H) 04/11/2017    Lab Results  Component Value Date   CHOL 202 (H) 04/02/2018   TRIG 102 04/02/2018   HDL 46 04/02/2018   CHOLHDL 4.4 04/02/2018   VLDL 20 04/02/2018   LDLCALC 136 (H) 04/02/2018   LDLCALC 167 (H) 04/11/2017    Physical Findings: AIMS:  , ,  ,  ,  CIWA:    COWS:     Musculoskeletal: Strength & Muscle Tone: within normal limits Gait & Station: normal Patient leans: N/A  Psychiatric Specialty Exam: Physical Exam  Nursing note and vitals reviewed. Constitutional: He is oriented to person, place, and time.  HENT:  Head: Normocephalic.  Respiratory: Effort normal.  GI: Normal appearance.  Neurological: He is alert and oriented to person, place, and time.    Review of Systems  Blood pressure (!) 126/93, pulse 100, temperature 98.4 F (36.9 C), temperature source Oral, resp. rate 18, height 6\' 1"  (1.854 m), weight 73 kg, SpO2 100 %.Body mass index is 21.24 kg/m.  General Appearance: Casual  Eye Contact:  Fair  Speech:  Normal Rate  Volume:  Normal  Mood:  Anxious  Affect:  Congruent  Thought Process:  Coherent and Descriptions of Associations: Intact  Orientation:  Full (Time, Place, and Person)  Thought Content:  Logical  Suicidal Thoughts:  No  Homicidal Thoughts:  No  Memory:  Immediate;   Fair Recent;   Fair Remote;   Fair  Judgement:  Intact  Insight:  Fair  Psychomotor Activity:  Normal  Concentration:  Concentration: Fair and Attention Span: Fair  Recall:  of Knowledge:  Fair  Language:  Fair  Akathisia:  Negative  Handed:  Right  AIMS (if indicated):     Assets:  Desire for Improvement Resilience  ADL's:  Intact  Cognition:  WNL  Sleep:  Number of Hours: 9.5     Treatment Plan Summary: Daily contact with patient to assess and evaluate symptoms and progress in treatment, Medication management and Plan : Patient is seen and examined.  Patient is a 44 year old male with the above-stated past psychiatric history who is seen in follow-up.    Diagnosis: 1.  Schizoaffective disorder; bipolar type 2.  History of amphetamine and psychostimulant use disorders 3.  Cannabis use disorder  Pertinent findings on examination today. 1.  No auditory hallucinations. 2.  No suicidal ideation. 3.  Improvement in slurred speech. 4.  Headache and dizziness from recent fall. 5.  Less lethargy from reduced medications.  Plan: 1.  Order CT scan of the head without contrast secondary to fall, headache and dizziness. 2.  Imitrex 50 mg p.o. x1 for migraine headache pain. 3.  Tramadol 50 mg p.o. every 6 hours as needed headache. 4.  Continue Zyprexa 15 mg p.o. nightly for mood stability and psychosis. 5.  Continue Depakote DR 500 mg p.o. every 12 hours for mood stability. 6.  Decrease Tegretol to 200 mg p.o. twice daily for mood stability. 7.  Stop trazodone secondary to possibility of orthostatic hypotension. 8.  Doxepin 25 mg p.o. nightly as needed insomnia. 9.  Plan for discharge on 06/22/2019.  06/24/2019, MD 06/18/2020, 1:28 PM

## 2020-06-19 MED ORDER — OLANZAPINE 5 MG PO TBDP
5.0000 mg | ORAL_TABLET | ORAL | Status: AC
  Administered 2020-06-19: 5 mg via ORAL
  Filled 2020-06-19 (×2): qty 1

## 2020-06-19 MED ORDER — ZIPRASIDONE MESYLATE 20 MG IM SOLR
20.0000 mg | Freq: Once | INTRAMUSCULAR | Status: AC
  Filled 2020-06-19: qty 20

## 2020-06-19 MED ORDER — ZIPRASIDONE MESYLATE 20 MG IM SOLR
INTRAMUSCULAR | Status: AC
  Administered 2020-06-19: 20 mg
  Filled 2020-06-19: qty 20

## 2020-06-19 MED ORDER — OLANZAPINE 5 MG PO TBDP
25.0000 mg | ORAL_TABLET | Freq: Every day | ORAL | Status: DC
  Administered 2020-06-19: 25 mg via ORAL
  Filled 2020-06-19 (×2): qty 1

## 2020-06-19 NOTE — Progress Notes (Signed)
°   06/19/20 2020  [place] Suicide Severity Rating Scale-Reassessment  2. Suicidal Thoughts No  6. Suicide Behavior Question No  C-SSRS Reassessment Risk Category Score No Risk  [facility] Suicide Reassessment Precaution Interventions  [facility] Suicide Reassessment Bundle Interventions Low Risk Interventions implemented

## 2020-06-19 NOTE — Progress Notes (Signed)
°   06/19/20 2020  COVID-19 Daily Checkoff  Have you had a fever (temp > 37.80C/100F)  in the past 24 hours?  No  If you have had runny nose, nasal congestion, sneezing in the past 24 hours, has it worsened? No  COVID-19 EXPOSURE  Have you traveled outside the state in the past 14 days? No  Have you been in contact with someone with a confirmed diagnosis of COVID-19 or PUI in the past 14 days without wearing appropriate PPE? No  Have you been living in the same home as a person with confirmed diagnosis of COVID-19 or a PUI (household contact)? No  Have you been diagnosed with COVID-19? No

## 2020-06-19 NOTE — Progress Notes (Signed)
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  Objective: Patient is seen and examined.  Patient is a 44 year old male with the above-stated past psychiatric history who is seen in follow-up.  He began to accelerate more so yesterday afternoon.  I spoke to him about it and asked him to attempt to calm himself down.  He continued accelerating into the evening.  He ended up refusing his Tegretol last night.  He still slept about 6 hours last night, but this morning is accelerated again.  He is on the telephone stating that staff are against him, and that they are trying to overmedicate him.  We had a discussion after some agitation from another patient which led him to become even more agitated.  He had pressured speech, agitation and increasing paranoia.  We discussed changes in his medications, and he was agreeable to the injection of Geodon at this time to help him calm down.  We discussed stopping the Tegretol (especially since he was not taking it anyway) and increasing his Zyprexa.  After he received the injection his agitation decreased significantly, and was able to pleasantly discuss his case.  His vital signs are stable, he is afebrile.  The results of his CT scan are still not back yet.  He has shown no evidence of any neurological deficits at this point.  No new laboratories.  He clearly was more paranoid today, but denied auditory and visual hallucinations.  He denied suicidal or homicidal ideation.  Principal Problem: <principal problem not specified> Diagnosis: Active Problems:   Schizoaffective disorder, bipolar type (HCC)   Amphetamine and  psychostimulant-induced psychosis with hallucinations (HCC)  Total Time spent with patient: 20 minutes  Past Psychiatric History: See admission H&P  Past Medical History:  Past Medical History:  Diagnosis Date  . Marijuana abuse   . Schizophrenia, paranoid (HCC)   . Tardive dyskinesia   . Tobacco abuse     Past Surgical History:  Procedure Laterality Date  . NO PAST SURGERIES     Family History: History reviewed. No pertinent family history. Family Psychiatric  History: See admission H&P Social History:  Social History   Substance and Sexual Activity  Alcohol Use Yes  . Alcohol/week: 0.0 standard drinks   Comment: Unknown      Social History   Substance and Sexual Activity  Drug Use Yes  . Frequency: 7.0 times per week  . Types: Marijuana, Cocaine   Comment: Unknown     Social History   Socioeconomic History  . Marital status: Legally Separated    Spouse name: Not on file  . Number of children: Not on file  . Years of education: Not on file  . Highest education level: Not on file  Occupational History  . Occupation: Disability  Tobacco Use  . Smoking status: Current Every Day Smoker    Packs/day: 2.00    Types: Cigarettes  . Smokeless tobacco: Never Used  . Tobacco comment: Unknown   Substance and Sexual Activity  . Alcohol use: Yes    Alcohol/week: 0.0 standard drinks    Comment: Unknown   . Drug use: Yes    Frequency: 7.0 times per week    Types: Marijuana, Cocaine  Comment: Unknown   . Sexual activity: Not on file  Other Topics Concern  . Not on file  Social History Narrative   Pt discharged from [facility] on 10/10/2019, was assigned to a hotel in [place] by his [facility] provider, [facility] in [place], [place].     Social Determinants of Health   Financial Resource Strain:   . Difficulty of Paying Living Expenses:   Food Insecurity:   . Worried About Charity fundraiser in the Last Year:   . Arboriculturist in the Last Year:    Transportation Needs:   . Film/video editor (Medical):   Marland Kitchen Lack of Transportation (Non-Medical):   Physical Activity:   . Days of Exercise per Week:   . Minutes of Exercise per Session:   Stress:   . Feeling of Stress :   Social Connections:   . Frequency of Communication with Friends and Family:   . Frequency of Social Gatherings with Friends and Family:   . Attends Religious Services:   . Active Member of Clubs or Organizations:   . Attends Archivist Meetings:   Marland Kitchen Marital Status:    Additional Social History:                         Sleep: Fair  Appetite:  Good  Current Medications: Current Facility-Administered Medications  Medication Dose Route Frequency Provider Last Rate Last Admin  . acetaminophen (TYLENOL) tablet 650 mg  650 mg Oral Q6H PRN Sharma Covert, MD   650 mg at 06/17/20 2044  . alum & mag hydroxide-simeth (MAALOX/MYLANTA) 200-200-20 MG/5ML suspension 30 mL  30 mL Oral Q4H PRN Sharma Covert, MD   30 mL at 06/16/20 1240  . divalproex (DEPAKOTE) DR tablet 500 mg  500 mg Oral Q12H Sharma Covert, MD   500 mg at 06/19/20 0814  . doxepin (SINEQUAN) capsule 25 mg  25 mg Oral QHS PRN Sharma Covert, MD      . feeding supplement (ENSURE ENLIVE) (ENSURE ENLIVE) liquid 237 mL  237 mL Oral BID BM Anike, Adaku C, NP   237 mL at 06/19/20 1016  . magnesium hydroxide (MILK OF MAGNESIA) suspension 30 mL  30 mL Oral Daily PRN Sharma Covert, MD      . OLANZapine zydis [facility]) disintegrating tablet 10 mg  10 mg Oral Q8H PRN Sharma Covert, MD   10 mg at 06/19/20 0815  . OLANZapine zydis (ZYPREXA) disintegrating tablet 25 mg  25 mg Oral QHS Sharma Covert, MD      . SUMAtriptan (IMITREX) tablet 50 mg  50 mg Oral Q2H PRN Sharma Covert, MD      . traMADol Veatrice Bourbon) tablet 50 mg  50 mg Oral Q6H PRN Sharma Covert, MD        Lab Results: No results found for this or any previous visit (from the past 48  hour(s)).  Blood Alcohol level:  Lab Results  Component Value Date   ETH <10 06/13/2020   ETH <10 40/09/2724    Metabolic Disorder Labs: Lab Results  Component Value Date   HGBA1C 4.6 (L) 04/02/2018   MPG 85.32 04/02/2018   MPG 91 04/11/2017   Lab Results  Component Value Date   PROLACTIN 28.0 (H) 11/06/2019   PROLACTIN 26.4 (H) 04/11/2017   Lab Results  Component Value Date   CHOL 202 (H) 04/02/2018   TRIG 102 04/02/2018  HDL 46 04/02/2018   CHOLHDL 4.4 04/02/2018   VLDL 20 04/02/2018   LDLCALC 136 (H) 04/02/2018   LDLCALC 167 (H) 04/11/2017    Physical Findings: AIMS:  , ,  ,  ,    CIWA:    COWS:     Musculoskeletal: Strength & Muscle Tone: within normal limits Gait & Station: normal Patient leans: N/A  Psychiatric Specialty Exam: Physical Exam Vitals and nursing note reviewed.  Constitutional:      Appearance: Normal appearance.  HENT:     Head: Normocephalic and atraumatic.  Pulmonary:     Effort: Pulmonary effort is normal.  Neurological:     General: No focal deficit present.     Mental Status: He is alert and oriented to person, place, and time.     Review of Systems  Blood pressure 121/86, pulse 84, temperature (!) 97.5 F (36.4 C), temperature source Oral, resp. rate 18, height 6\' 1"  (1.854 m), weight 73 kg, SpO2 100 %.Body mass index is 21.24 kg/m.  General Appearance: Casual  Eye Contact:  Good  Speech:  Pressured  Volume:  Increased  Mood:  Anxious, Dysphoric and Irritable  Affect:  Labile  Thought Process:  Coherent and Descriptions of Associations: Tangential  Orientation:  Full (Time, Place, and Person)  Thought Content:  Delusions and Paranoid Ideation  Suicidal Thoughts:  No  Homicidal Thoughts:  No  Memory:  Immediate;   Fair Recent;   Fair Remote;   Fair  Judgement:  Impaired  Insight:  Lacking  Psychomotor Activity:  Increased  Concentration:  Concentration: Fair and Attention Span: Fair  Recall:  of  Knowledge:  Fair  Language:  Good  Akathisia:  Negative  Handed:  Right  AIMS (if indicated):     Assets:  Desire for Improvement Resilience  ADL's:  Intact  Cognition:  WNL  Sleep:  Number of Hours: 6     Treatment Plan Summary: Daily contact with patient to assess and evaluate symptoms and progress in treatment, Medication management and Plan : Patient is seen and examined.  Patient is a 44 year old male with the above-stated past psychiatric history who is seen in follow-up.   Diagnosis: 1. Schizoaffective disorder; bipolar type 2. History of amphetamine and psychostimulant use disorders 3. Cannabis use disorder  Pertinent findings on examination today: 1.  Increase paranoia. 2.  Denied auditory or visual hallucinations. 3.  Continued to deny suicidal or homicidal ideation. 4.  Increased agitation and manic-like behaviors. 5.  Decreased lethargy. 6.  Denied headache.  Plan: 1.  Await results of CT scan of the head. 2.  Increase Zyprexa to 25 mg p.o. nightly for mood stability and psychosis. 3.  Stop Tegretol. 4.  Continue Depakote DR 500 mg p.o. every 12 hours for mood stability. 5.  Geodon 20 mg IM x1 now. 6.  Continue doxepin 25 mg p.o. nightly as needed insomnia. 7.  Continue sumatriptan 50 mg p.o. as needed migraine headache. 8.  Stop tramadol. 9.  Order Depakote level, liver function enzymes and CBC with differential for a.m. tomorrow. 10.  Disposition planning-in progress.   46, MD 06/19/2020, 11:44 AM

## 2020-06-19 NOTE — Progress Notes (Signed)
   06/19/20 1300  Psych Admission Type (Psych Patients Only)  Admission Status Involuntary  Psychosocial Assessment  Patient Complaints Anxiety  Eye Contact Fair  Facial Expression Animated;Anxious  Affect Anxious;Appropriate to circumstance  Speech Slow;Slurred  Interaction Assertive;Attention-seeking  Motor Activity Pacing;Slow  Appearance/Hygiene Disheveled;Poor hygiene  Behavior Characteristics Cooperative;Anxious  Mood Anxious  Aggressive Behavior  Targets Other (Comment) ("I will knock my case worker out if I don't get my card")  Type of Behavior Verbal  Effect No apparent injury  Thought Process  Coherency Circumstantial  Content Blaming others;Magical thinking  Delusions Persecutory  Perception Hallucinations  Hallucination Auditory  Judgment Limited  Confusion None  Danger to Self  Current suicidal ideation? Denies  Danger to Others  Danger to Others None reported or observed  Danger to Others Abnormal  Harmful Behavior to others Threats of violence towards other people observed or expressed   Destructive Behavior No threats or harm toward property

## 2020-06-19 NOTE — [facility] Group Notes (Signed)
[facility] Group Notes: (Clinical Social Work)   06/19/2020      Type of Therapy:  Group Therapy   Participation Level:  Did Not Attend - was invited individually by Nurse/MHT and chose not to attend.   Leisa Lenz, [name] 06/19/2020  12:21 PM

## 2020-06-20 LAB — HEPATIC FUNCTION PANEL
ALT: 38 U/L (ref 0–44)
AST: 52 U/L — ABNORMAL HIGH (ref 15–41)
Albumin: 4.1 g/dL (ref 3.5–5.0)
Alkaline Phosphatase: 52 U/L (ref 38–126)
Bilirubin, Direct: <0.1 mg/dL (ref 0.0–0.2)
Total Bilirubin: 0.9 mg/dL (ref 0.3–1.2)
Total Protein: 7.6 g/dL (ref 6.5–8.1)

## 2020-06-20 LAB — CBC WITH DIFFERENTIAL/PLATELET
Abs Immature Granulocytes: 0.03 10*3/uL (ref 0.00–0.07)
Basophils Absolute: 0.1 10*3/uL (ref 0.0–0.1)
Basophils Relative: 1 %
Eosinophils Absolute: 0.3 10*3/uL (ref 0.0–0.5)
Eosinophils Relative: 4 %
HCT: 39.4 % (ref 39.0–52.0)
Hemoglobin: 12.1 g/dL — ABNORMAL LOW (ref 13.0–17.0)
Immature Granulocytes: 1 %
Lymphocytes Relative: 46 %
Lymphs Abs: 2.9 10*3/uL (ref 0.7–4.0)
MCH: 29.4 pg (ref 26.0–34.0)
MCHC: 30.7 g/dL (ref 30.0–36.0)
MCV: 95.9 fL (ref 80.0–100.0)
Monocytes Absolute: 0.9 10*3/uL (ref 0.1–1.0)
Monocytes Relative: 14 %
Neutro Abs: 2.1 10*3/uL (ref 1.7–7.7)
Neutrophils Relative %: 34 %
Platelets: 304 10*3/uL (ref 150–400)
RBC: 4.11 MIL/uL — ABNORMAL LOW (ref 4.22–5.81)
RDW: 13.9 % (ref 11.5–15.5)
WBC: 6.3 10*3/uL (ref 4.0–10.5)
nRBC: 0 % (ref 0.0–0.2)

## 2020-06-20 LAB — VALPROIC ACID LEVEL: Valproic Acid Lvl: 34 ug/mL — ABNORMAL LOW (ref 50.0–100.0)

## 2020-06-20 MED ORDER — SILVER SULFADIAZINE 1 % EX CREA
TOPICAL_CREAM | Freq: Two times a day (BID) | CUTANEOUS | Status: DC
  Filled 2020-06-20: qty 20

## 2020-06-20 MED ORDER — OLANZAPINE 10 MG PO TABS
20.0000 mg | ORAL_TABLET | Freq: Every day | ORAL | Status: DC
  Filled 2020-06-20: qty 2

## 2020-06-20 MED ORDER — OLANZAPINE 20 MG PO TABS: 20.0000 mg | ORAL_TABLET | Freq: Every day | ORAL | 0 refills | Status: DC

## 2020-06-20 MED ORDER — TRAMADOL HCL 50 MG PO TABS: 50.0000 mg | ORAL_TABLET | Freq: Four times a day (QID) | ORAL | Status: DC | PRN

## 2020-06-20 MED ORDER — LORAZEPAM 1 MG PO TABS: 2.0000 mg | ORAL_TABLET | ORAL | Status: AC

## 2020-06-20 MED ORDER — ZIPRASIDONE MESYLATE 20 MG IM SOLR
20.0000 mg | Freq: Once | INTRAMUSCULAR | Status: DC
  Filled 2020-06-20: qty 20

## 2020-06-20 MED ORDER — ZIPRASIDONE MESYLATE 20 MG IM SOLR
INTRAMUSCULAR | Status: AC
  Filled 2020-06-20: qty 20

## 2020-06-20 MED ORDER — SILVER SULFADIAZINE 1 % EX CREA: TOPICAL_CREAM | Freq: Two times a day (BID) | CUTANEOUS | 0 refills | Status: DC

## 2020-06-20 MED ORDER — LORAZEPAM 1 MG PO TABS
ORAL_TABLET | ORAL | Status: AC
  Administered 2020-06-20: 2 mg via ORAL
  Filled 2020-06-20: qty 2

## 2020-06-20 MED ORDER — OLANZAPINE 10 MG PO TABS
10.0000 mg | ORAL_TABLET | Freq: Every day | ORAL | Status: DC
  Filled 2020-06-20 (×2): qty 1

## 2020-06-20 MED ORDER — LORAZEPAM 2 MG/ML IJ SOLN
INTRAMUSCULAR | Status: AC
  Filled 2020-06-20: qty 1

## 2020-06-20 MED ORDER — NAPHAZOLINE-GLYCERIN 0.012-0.2 % OP SOLN: 1.0000 [drp] | Freq: Four times a day (QID) | OPHTHALMIC | Status: DC | PRN

## 2020-06-20 MED ORDER — OLANZAPINE 10 MG PO TBDP
10.0000 mg | ORAL_TABLET | Freq: Once | ORAL | Status: AC
  Administered 2020-06-20: 10 mg via ORAL
  Filled 2020-06-20: qty 1

## 2020-06-20 MED ORDER — DIVALPROEX SODIUM 500 MG PO DR TAB: 500.0000 mg | DELAYED_RELEASE_TABLET | Freq: Two times a day (BID) | ORAL | 0 refills | Status: DC

## 2020-06-20 MED ORDER — OLANZAPINE 10 MG PO TABS: 10.0000 mg | ORAL_TABLET | Freq: Every day | ORAL | 0 refills | Status: DC

## 2020-06-20 NOTE — Progress Notes (Signed)
   06/19/20 2020  Psych Admission Type (Psych Patients Only)  Admission Status Involuntary  Psychosocial Assessment  Patient Complaints Anxiety  Eye Contact Fair  Facial Expression Animated;Anxious  Affect Anxious;Appropriate to circumstance  Speech Slow;Slurred  Interaction Assertive;Attention-seeking  Motor Activity Pacing;Slow  Appearance/Hygiene Disheveled;Poor hygiene  Behavior Characteristics Appropriate to situation;Cooperative  Mood Preoccupied  Aggressive Behavior  Targets Other (Comment) ("I will knock my case worker out if I don't get my card")  Type of Behavior Verbal  Effect No apparent injury  Thought Process  Coherency Circumstantial  Content Blaming others;Magical thinking  Delusions Persecutory  Perception Hallucinations  Hallucination Auditory  Judgment Limited  Confusion None  Danger to Self  Current suicidal ideation? Denies  Danger to Others  Danger to Others None reported or observed  Danger to Others Abnormal  Harmful Behavior to others Threats of violence towards other people observed or expressed   Destructive Behavior No threats or harm toward property

## 2020-06-20 NOTE — Progress Notes (Signed)
Patient has been up and active on the unit, attended group this evening and has voiced no complaints. He reports that he is discharging on tomorrow.Patient currently denies having pain, -si/hi/a/v hall. Support and encouragement offered, safety maintained on unit, will continue to monitor.

## 2020-06-20 NOTE — Plan of Care (Signed)
Discharge note  Patient verbalizes readiness for discharge. Follow up plan explained, AVS, Transition record and SRA given. Prescriptions and teaching provided. Belongings returned and signed for. Suicide safety plan completed and signed. Patient verbalizes understanding. Patient denies SI/HI and assures this Clinical research associate they will seek assistance should that change. Patient discharged to lobby to wait for Lyft.  Problem: Education: Goal: Knowledge of [place] [facility] Education information/materials will improve Outcome: Adequate for Discharge Goal: Emotional status will improve Outcome: Adequate for Discharge Goal: Mental status will improve Outcome: Adequate for Discharge Goal: Verbalization of understanding the information provided will improve Outcome: Adequate for Discharge   Problem: Activity: Goal: Interest or engagement in activities will improve Outcome: Adequate for Discharge Goal: Sleeping patterns will improve Outcome: Adequate for Discharge   Problem: Coping: Goal: Ability to verbalize frustrations and anger appropriately will improve Outcome: Adequate for Discharge Goal: Ability to demonstrate self-control will improve Outcome: Adequate for Discharge   Problem: Health Behavior/Discharge Planning: Goal: Identification of resources available to assist in meeting health care needs will improve Outcome: Adequate for Discharge Goal: Compliance with treatment plan for underlying cause of condition will improve Outcome: Adequate for Discharge   Problem: Physical Regulation: Goal: Ability to maintain clinical measurements within normal limits will improve Outcome: Adequate for Discharge   Problem: Safety: Goal: Periods of time without injury will increase Outcome: Adequate for Discharge   Problem: Activity: Goal: Will verbalize the importance of balancing activity with adequate rest periods Outcome: Adequate for Discharge   Problem: Education: Goal: Will be free of  psychotic symptoms Outcome: Adequate for Discharge Goal: Knowledge of the prescribed therapeutic regimen will improve Outcome: Adequate for Discharge   Problem: Coping: Goal: Coping ability will improve Outcome: Adequate for Discharge Goal: Will verbalize feelings Outcome: Adequate for Discharge   Problem: Health Behavior/Discharge Planning: Goal: Compliance with prescribed medication regimen will improve Outcome: Adequate for Discharge   Problem: Nutritional: Goal: Ability to achieve adequate nutritional intake will improve Outcome: Adequate for Discharge   Problem: Role Relationship: Goal: Ability to communicate needs accurately will improve Outcome: Adequate for Discharge Goal: Ability to interact with others will improve Outcome: Adequate for Discharge   Problem: Safety: Goal: Ability to redirect hostility and anger into socially appropriate behaviors will improve Outcome: Adequate for Discharge Goal: Ability to remain free from injury will improve Outcome: Adequate for Discharge   Problem: Self-Care: Goal: Ability to participate in self-care as condition permits will improve Outcome: Adequate for Discharge   Problem: Self-Concept: Goal: Will verbalize positive feelings about self Outcome: Adequate for Discharge
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
## 2020-06-20 NOTE — Progress Notes (Addendum)
  [facility] Adult Case Management Discharge Plan :  Will you be returning to the same living situation after discharge:  No.  Going to a new [facility] - patient states he has already been accepted to this house At discharge, do you have transportation home?: Yes,  CSW requesting a Lyft through [employer] Do you have the ability to pay for your medications: Yes,  Medicare  Release of information consent forms completed and emailed to Medical Records, then turned in to Medical Records by CSW.   Patient to Follow up at:  Follow-up Information    [facility] Call.   Why: Referral has been sent to [facility].  Please call them to follow up as desired. Contact information: [address] [place] [address] [phone]        [facility] Call.   Why: Follow up with ACT Team at discharge.  Social worker has called and informed them of your discharge on Sunday 6/27, so they will see you within 7 days. Contact information: [address] [place] [address] [phone]               Next level of care provider has access to [facility] Link:no  Safety Planning and Suicide Prevention discussed: No.  Declined  Have you used any form of tobacco in the last 30 days? (Cigarettes, Smokeless Tobacco, Cigars, and/or Pipes): Yes  Has patient been referred to the Quitline?: Patient refused referral  Patient has been referred for addiction treatment: Yes  Lynnell Chad, [name] 06/20/2020, 10:19 AM

## 2020-06-20 NOTE — BHH Suicide Risk Assessment (Signed)
[facility] Discharge Suicide Risk Assessment   Principal Problem: <principal problem not specified> Discharge Diagnoses: Active Problems:   Schizoaffective disorder, bipolar type (HCC)   Amphetamine and psychostimulant-induced psychosis with hallucinations (HCC)   Total Time spent with patient: 45 minutes  Musculoskeletal: Strength & Muscle Tone: within normal limits Gait & Station: normal Patient leans: N/A  Psychiatric Specialty Exam: Review of Systems  All other systems reviewed and are negative.   Blood pressure 122/89, pulse 77, temperature 97.9 F (36.6 C), temperature source Oral, resp. rate 18, height 6\' 1"  (1.854 m), weight 73 kg, SpO2 100 %.Body mass index is 21.24 kg/m.  General Appearance: Casual  Eye Contact::  Good  Speech:  Normal Rate409  Volume:  Normal  Mood:  Euthymic  Affect:  Congruent  Thought Process:  Coherent and Descriptions of Associations: Intact  Orientation:  Full (Time, Place, and Person)  Thought Content:  Logical  Suicidal Thoughts:  No  Homicidal Thoughts:  Yes.  without intent/plan  Memory:  Immediate;   Fair Recent;   Fair Remote;   Fair  Judgement:  Intact  Insight:  Fair  Psychomotor Activity:  Normal  Concentration:  Good  Recall:  Good  Fund of Knowledge:Good  Language: Good  Akathisia:  Negative  Handed:  Right  AIMS (if indicated):     Assets:  Desire for Improvement Housing Resilience  Sleep:  Number of Hours: 7.75  Cognition: WNL  ADL's:  Intact   Mental Status Per Nursing Assessment::   On Admission:  Thoughts of violence towards others  Demographic Factors:  Male, Low socioeconomic status, Living alone and Unemployed  Loss Factors: Loss of significant relationship  Historical Factors: Impulsivity  Risk Reduction Factors:   Positive therapeutic relationship  Continued Clinical Symptoms:  Bipolar Disorder:   Mixed State Schizophrenia:   Paranoid or undifferentiated type  Cognitive Features That Contribute To  Risk:  None    Suicide Risk:  Minimal: No identifiable suicidal ideation.  Patients presenting with no risk factors but with morbid ruminations; may be classified as minimal risk based on the severity of the depressive symptoms   Follow-up Information    [facility] Follow up.   Contact information: [address] [place] [address]        [facility] Follow up.   Why: Follow up with ACT Team at discharge Contact information: [address] [place] [phone]               Plan Of Care/Follow-up recommendations:  Activity:  ad lib  [phone], MD 06/20/2020, 9:46 AM
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
## 2020-06-25 ENCOUNTER — Other Ambulatory Visit: Payer: Self-pay

## 2020-06-25 ENCOUNTER — Emergency Department
Admission: EM | Admit: 2020-06-25 | Discharge: 2020-06-25 | Disposition: A | Discharge status: Home / Self Care | Payer: Medicare Other | Attending: Emergency Medicine | Admitting: Emergency Medicine

## 2020-06-25 DIAGNOSIS — Z5189 Encounter for other specified aftercare: Secondary | ICD-10-CM

## 2020-06-25 DIAGNOSIS — F17210 Nicotine dependence, cigarettes, uncomplicated: Secondary | ICD-10-CM | POA: Insufficient documentation

## 2020-06-25 DIAGNOSIS — Z4800 Encounter for change or removal of nonsurgical wound dressing: Secondary | ICD-10-CM | POA: Diagnosis not present

## 2020-06-25 DIAGNOSIS — T300 Burn of unspecified body region, unspecified degree: Secondary | ICD-10-CM

## 2020-06-25 DIAGNOSIS — J45909 Unspecified asthma, uncomplicated: Secondary | ICD-10-CM | POA: Insufficient documentation
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
## 2020-07-07 ENCOUNTER — Emergency Department
Admission: EM | Admit: 2020-07-07 | Discharge: 2020-07-07 | Disposition: A | Discharge status: Home / Self Care | Payer: Medicare Other | Source: Home / Self Care

## 2020-07-07 ENCOUNTER — Other Ambulatory Visit: Payer: Self-pay

## 2020-07-07 ENCOUNTER — Emergency Department
Admission: EM | Admit: 2020-07-07 | Discharge: 2020-07-07 | Disposition: A | Discharge status: Home / Self Care | Payer: Medicare Other | Attending: Emergency Medicine | Admitting: Emergency Medicine

## 2020-07-07 ENCOUNTER — Encounter: Payer: Self-pay | Admitting: Emergency Medicine

## 2020-07-07 DIAGNOSIS — R45850 Homicidal ideations: Secondary | ICD-10-CM | POA: Insufficient documentation

## 2020-07-07 DIAGNOSIS — Z20822 Contact with and (suspected) exposure to covid-19: Secondary | ICD-10-CM | POA: Insufficient documentation

## 2020-07-07 DIAGNOSIS — Z5321 Procedure and treatment not carried out due to patient leaving prior to being seen by health care provider: Secondary | ICD-10-CM | POA: Insufficient documentation

## 2020-07-07 DIAGNOSIS — R29818 Other symptoms and signs involving the nervous system: Secondary | ICD-10-CM

## 2020-07-07 DIAGNOSIS — M79605 Pain in left leg: Secondary | ICD-10-CM | POA: Insufficient documentation

## 2020-07-07 DIAGNOSIS — F200 Paranoid schizophrenia: Secondary | ICD-10-CM | POA: Diagnosis not present

## 2020-07-07 DIAGNOSIS — H5713 Ocular pain, bilateral: Secondary | ICD-10-CM | POA: Insufficient documentation

## 2020-07-07 DIAGNOSIS — M79604 Pain in right leg: Secondary | ICD-10-CM | POA: Insufficient documentation

## 2020-07-07 LAB — CBC
HCT: 39.7 % (ref 39.0–52.0)
Hemoglobin: 12.9 g/dL — ABNORMAL LOW (ref 13.0–17.0)
MCH: 29.3 pg (ref 26.0–34.0)
MCHC: 32.5 g/dL (ref 30.0–36.0)
MCV: 90 fL (ref 80.0–100.0)
Platelets: 341 10*3/uL (ref 150–400)
RBC: 4.41 MIL/uL (ref 4.22–5.81)
RDW: 13.3 % (ref 11.5–15.5)
WBC: 6.6 10*3/uL (ref 4.0–10.5)
nRBC: 0 % (ref 0.0–0.2)

## 2020-07-07 LAB — URINE DRUG SCREEN, QUALITATIVE (ARMC ONLY)
Amphetamines, Ur Screen: NOT DETECTED
Barbiturates, Ur Screen: NOT DETECTED
Benzodiazepine, Ur Scrn: NOT DETECTED
Cannabinoid 50 Ng, Ur ~~LOC~~: NOT DETECTED
Cocaine Metabolite,Ur ~~LOC~~: NOT DETECTED
MDMA (Ecstasy)Ur Screen: NOT DETECTED
Methadone Scn, Ur: NOT DETECTED
Opiate, Ur Screen: NOT DETECTED
Phencyclidine (PCP) Ur S: NOT DETECTED
Tricyclic, Ur Screen: NOT DETECTED

## 2020-07-07 LAB — COMPREHENSIVE METABOLIC PANEL
ALT: 14 U/L (ref 0–44)
AST: 33 U/L (ref 15–41)
Albumin: 4.6 g/dL (ref 3.5–5.0)
Alkaline Phosphatase: 55 U/L (ref 38–126)
Anion gap: 13 (ref 5–15)
BUN: 10 mg/dL (ref 6–20)
CO2: 26 mmol/L (ref 22–32)
Calcium: 9.5 mg/dL (ref 8.9–10.3)
Chloride: 98 mmol/L (ref 98–111)
Creatinine, Ser: 0.87 mg/dL (ref 0.61–1.24)
GFR calc Af Amer: >60 mL/min (ref >60)
GFR calc non Af Amer: >60 mL/min (ref >60)
Glucose, Bld: 103 mg/dL — ABNORMAL HIGH (ref 70–99)
Potassium: 4.1 mmol/L (ref 3.5–5.1)
Sodium: 137 mmol/L (ref 135–145)
Total Bilirubin: 0.7 mg/dL (ref 0.3–1.2)
Total Protein: 8.8 g/dL — ABNORMAL HIGH (ref 6.5–8.1)

## 2020-07-07 LAB — SALICYLATE LEVEL: Salicylate Lvl: <7 mg/dL — ABNORMAL LOW (ref 7.0–30.0)

## 2020-07-07 LAB — ACETAMINOPHEN LEVEL: Acetaminophen (Tylenol), Serum: <10 ug/mL — ABNORMAL LOW (ref 10–30)

## 2020-07-07 LAB — SARS CORONAVIRUS 2 BY RT PCR (HOSPITAL ORDER, PERFORMED IN ~~LOC~~ HOSPITAL LAB): SARS Coronavirus 2: NEGATIVE

## 2020-07-07 LAB — ETHANOL: Alcohol, Ethyl (B): <10 mg/dL (ref <10)

## 2020-07-07 MED ORDER — OLANZAPINE 10 MG PO TABS
10.0000 mg | ORAL_TABLET | Freq: Every day | ORAL | Status: DC
  Administered 2020-07-07: 10 mg via ORAL
  Filled 2020-07-07: qty 1

## 2020-07-07 MED ORDER — BENZTROPINE MESYLATE 1 MG PO TABS: 1.0000 mg | ORAL_TABLET | Freq: Two times a day (BID) | ORAL | Status: DC

## 2020-07-07 MED ORDER — OLANZAPINE 10 MG PO TABS: 20.0000 mg | ORAL_TABLET | Freq: Every day | ORAL | Status: DC

## 2020-07-07 MED ORDER — BENZTROPINE MESYLATE 1 MG/ML IJ SOLN
2.0000 mg | Freq: Once | INTRAMUSCULAR | Status: AC
  Administered 2020-07-07: 2 mg via INTRAMUSCULAR
  Filled 2020-07-07: qty 2

## 2020-07-07 MED ORDER — DIVALPROEX SODIUM 500 MG PO DR TAB
500.0000 mg | DELAYED_RELEASE_TABLET | Freq: Two times a day (BID) | ORAL | Status: DC
  Administered 2020-07-07: 500 mg via ORAL
  Filled 2020-07-07: qty 1

## 2020-07-07 MED ORDER — BENZTROPINE MESYLATE 1 MG PO TABS
1.0000 mg | ORAL_TABLET | Freq: Two times a day (BID) | ORAL | 0 refills | Status: DC
Start: 2020-07-07 — End: 2020-09-10

## 2020-07-07 NOTE — ED Notes (Addendum)
Pt refusing to change into clothing. Refusing all care.

## 2020-07-07 NOTE — ED Notes (Signed)
Pt called x's 3 from [facility] to treatment room, no response °
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
## 2020-07-07 NOTE — ED Triage Notes (Signed)
Pt called x's 3 from [facility] to treatment room, no response °

## 2020-07-07 NOTE — ED Notes (Signed)
Pt on phone.  Pt alert.
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
## 2020-07-07 NOTE — ED Notes (Signed)
Pt visualized in NAD at this time. Resting in bed with lights dimmed for comfort.

## 2020-07-07 NOTE — ED Triage Notes (Signed)
BIB [facility] from [facility] under IVC. Pt allegedly made comments about wanting to kill someone at [facility] [address]. Pt uncooperative in triage, will not answer questions or allow temperature to be taken. Pt left this AM from ED, was unable to be located in lobby when called for treatment room.

## 2020-07-07 NOTE — ED Notes (Signed)
Resumed care from megan rn.  Pt sleeping.  

## 2020-07-07 NOTE — ED Provider Notes (Signed)
Patient seen by psychiatry. They have rescinded IVC. Did recommend patient be discharged with cogentin.    Phineas Semen, MD 07/07/20 [phone]
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
## 2020-07-07 NOTE — ED Notes (Signed)
PT  IVC   FROM  [facility]  PENDING  CONSULT

## 2020-07-07 NOTE — ED Notes (Signed)
meds given  Pt waiting on discharge

## 2020-07-07 NOTE — ED Notes (Addendum)
This tech went to hand pt specimen cup. Pt stated "I don't need to pee I will just break the cup." Specimen cup will be handed to tech for when pt is ready to urinate. Blood obtained and sent to the lab.

## 2020-07-07 NOTE — ED Notes (Addendum)
Pt dressed out with this tech and security guard in the rm. Pt belongings consist of a green shirt, blue boxers, blue socks, brown boots, grey pants, a blue lighter, some d/c papers form another hospital, a pack of cigarettes, black headphones, a charger, a bottle of eye drops, some lottery tickets and a black phone. Pt belongings placed into a pt belongings bag and labeled with pt name. Pt cooperative while dressing out.
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
## 2020-07-07 NOTE — ED Triage Notes (Signed)
Pt agitated with the Triage process and the questions involved. When asked why the pt is seeking treatment tonight, he went off on long tangent about "having a rough week" and issues with family. Pt was re-directed to focus on his presenting medical issues that brought him here tonight, at which point the pt got very angry and stormed out of the Triage room.

## 2020-07-07 NOTE — Discharge Instructions (Addendum)
Please seek medical attention and help for any thoughts about wanting to harm yourself, harm others, any concerning change in behavior, severe depression, inappropriate drug use or any other new or concerning symptoms. ° °

## 2020-07-07 NOTE — BH Assessment (Addendum)
Assessment Note  Peter Wilson is an 44 y.o. male. Pt presented to the ED via [facility] and was placed under IVC by [facility] due to endorsing HI.  Upon interview, pt. was disheveled with slurred speech. Pt was initially calm; however, he became increasingly labile. The pt. reported that he'd had a desire to kill an old enemy that killed his mother and daughter earlier in the day, however he no longer had those thoughts. The pt. exhibited poor impulse control and made multiple demands to obtain his belongings to use his personal phone and/or leave throughout the assessment. The pt. was a poor historian and reported that he could go back to a group home ([facility]). This Clinical research associate contacted the group home who reported that the pt. hadn't lived there in 5-6 years. The pt. had poor concentration as he was preoccupied with being discharged throughout the entirety of the interview. The pt. denied SI and AV/H.   Collateral: Donnal Moat (Stepmother) [phone] Pt acts out and begins making threats to harm others when he can't have his way. Pt has burned many bridges as he disappears 2 days before getting his monthly stipend, despite making commitments to pay rent. Liborio Nixon reported that the pt. spends his disability on drugs within 1-2 days. Liborio Nixon explained that the pt. had lived with her on four occasions but the pt. is a danger to himself and walks all night. For this reason, Liborio Nixon reported that he is unable to come and live with her.   This Clinical research associate provided the pt with a list of shelter resources prior to discharge.   Diagnosis: Schizophrenia, paranoid [facility])  Past Medical History:  Past Medical History:  Diagnosis Date  . Marijuana abuse   . Schizophrenia, paranoid (HCC)   . Tardive dyskinesia   . Tobacco abuse     Past Surgical History:  Procedure Laterality Date  . NO PAST SURGERIES      Family History: No family history on file.  Social History:  reports that he has been smoking cigarettes. He  has been smoking about 2.00 packs per day. He has never used smokeless tobacco. He reports current alcohol use. He reports current drug use. Frequency: 7.00 times per week. Drugs: Marijuana and Cocaine.  Additional Social History:  Alcohol / Drug Use Pain Medications: See PTA Prescriptions: See PTA History of alcohol / drug use?: Yes Negative Consequences of Use: Financial, Personal relationships Withdrawal Symptoms: Aggressive/Assaultive, Agitation Substance #1 Name of Substance 1: THC Substance #2 Name of Substance 2: Alcohol Substance #3 Name of Substance 3: Cocaine  CIWA: CIWA-Ar BP: 113/71 Pulse Rate: (!) 108 COWS:    Allergies:  Allergies  Allergen Reactions  . Penicillins Rash    Did it involve swelling of the face/tongue/throat, SOB, or low BP? U Did it involve sudden or severe rash/hives, skin peeling, or any reaction on the inside of your mouth or nose? Y  Did you need to seek medical attention at a hospital or doctor's office? Y When did it last happen?childhood If all above answers are "NO", may proceed with cephalosporin use.     Home Medications: (Not in a hospital admission)   OB/GYN Status:  No LMP for male patient.  General Assessment Data Location of Assessment: [facility] ED TTS Assessment: In system Is this a Tele or Face-to-Face Assessment?: Face-to-Face Is this an Initial Assessment or a Re-assessment for this encounter?: Initial Assessment Patient Accompanied by:: N/A Language Other than English: No Living Arrangements: Homeless/Shelter What gender do  you identify as?: Male Date Telepsych consult ordered in [facility]: 07/07/20 Time Telepsych consult ordered in [facility]: 1324 Marital status: Single Maiden name: n/a Pregnancy Status: No Living Arrangements: Other (Comment) (Homeless) Can pt return to current living arrangement?: Yes Admission Status: Involuntary Petitioner: ED Attending Is patient capable of signing voluntary admission?: Yes Referral  Source: Other ([facility]) Insurance type: Medicare Part A and B  Medical Screening Exam ([facility] Walk-in ONLY) Medical Exam completed: Yes  Crisis Care Plan Living Arrangements: Other (Comment) (Homeless) Legal Guardian:  (Self) Name of Psychiatrist: [facility] Name of Therapist: [facility]  Education Status Is patient currently in school?: No Is the patient employed, unemployed or receiving disability?: Receiving disability income  Risk to self with the past 6 months Suicidal Ideation: No Has patient been a risk to self within the past 6 months prior to admission? : No Suicidal Intent: No Has patient had any suicidal intent within the past 6 months prior to admission? : No Is patient at risk for suicide?: No Suicidal Plan?: No Has patient had any suicidal plan within the past 6 months prior to admission? : No Access to Means: No Specify Access to Suicidal Means: n/a What has been your use of drugs/alcohol within the last 12 months?: active use Previous Attempts/Gestures: No How many times?: 0 Other Self Harm Risks: n/a Triggers for Past Attempts: None known Intentional Self Injurious Behavior: None Comment - Self Injurious Behavior: n/a Family Suicide History: Unknown Recent stressful life event(s): Conflict (Comment) Persecutory voices/beliefs?: Yes Depression: No Depression Symptoms: Feeling angry/irritable Substance abuse history and/or treatment for substance abuse?: Yes Suicide prevention information given to non-admitted patients: Not applicable  Risk to Others within the past 6 months Homicidal Ideation: No Does patient have any lifetime risk of violence toward others beyond the six months prior to admission? : No Thoughts of Harm to Others: No Comment - Thoughts of Harm to Others: n/a Current Homicidal Intent: No Current Homicidal Plan: No Describe Current Homicidal Plan: n/a Access to Homicidal Means: No Describe Access to  Homicidal Means: n/a Identified Victim: n/a History of harm to others?: Yes Assessment of Violence: In distant past Violent Behavior Description: Violent towards hospital staff Does patient have access to weapons?: No Criminal Charges Pending?: No Describe Pending Criminal Charges: n/a Does patient have a court date: No Is patient on probation?: Unknown  Psychosis Hallucinations: None noted Delusions: None noted  Mental Status Report Appearance/Hygiene: Disheveled, Poor hygiene Eye Contact: Good Motor Activity: Rigidity Speech: Pressured, Slurred, Slow Level of Consciousness: Restless Mood: Anxious, Labile, Irritable Affect: Irritable, Labile, Threatening Anxiety Level: Moderate Thought Processes: Tangential, Irrelevant Judgement: Impaired Orientation: Person, Place, Situation, Time Obsessive Compulsive Thoughts/Behaviors: None  Cognitive Functioning Concentration: Fair Memory: Recent Impaired, Remote Impaired Is patient IDD: No Insight: Poor Impulse Control: Poor Sleep: No Change Vegetative Symptoms: None  ADLScreening [facility] Assessment Services) Patient's cognitive ability adequate to safely complete daily activities?: Yes Patient able to express need for assistance with ADLs?: Yes Independently performs ADLs?: Yes (appropriate for developmental age)  Prior Inpatient Therapy Prior Inpatient Therapy: Yes Prior Therapy Dates: 06/2020 Prior Therapy Facilty/Provider(s): [facility], [facility], [facility] Reason for Treatment: Schizophrenia  Prior Outpatient Therapy Prior Outpatient Therapy: Yes Prior Therapy Dates: Current Prior Therapy Facilty/Provider(s): [facility] Reason for Treatment: Schizophrenia Does patient have an ACCT team?: Yes Does patient have Intensive In-House Services?  : No Does patient have [facility] services? : No Does patient have P4CC services?: No  ADL Screening (condition at  time of admission) Patient's cognitive ability  adequate to safely complete daily activities?: Yes Is the patient deaf or have difficulty hearing?: No Does the patient have difficulty seeing, even when wearing glasses/contacts?: No Does the patient have difficulty concentrating, remembering, or making decisions?: No Patient able to express need for assistance with ADLs?: Yes Does the patient have difficulty dressing or bathing?: No Independently performs ADLs?: Yes (appropriate for developmental age) Does the patient have difficulty walking or climbing stairs?: No Weakness of Legs: None Weakness of Arms/Hands: None  Home Assistive Devices/Equipment Home Assistive Devices/Equipment: None  Therapy Consults (therapy consults require a physician order) PT Evaluation Needed: No OT Evalulation Needed: No SLP Evaluation Needed: No Abuse/Neglect Assessment (Assessment to be complete while patient is alone) Physical Abuse: Denies Verbal Abuse: Denies Sexual Abuse: Denies Exploitation of patient/patient's resources: Denies Self-Neglect: Denies Values / Beliefs Cultural Requests During Hospitalization: None Spiritual Requests During Hospitalization: None Consults Spiritual Care Consult Needed: No Transition of Care Team Consult Needed: No Advance Directives (For Healthcare) Does Patient Have a Medical Advance Directive?: Unable to assess, patient is non-responsive or altered mental status          Disposition: Per psych MD Dr. Smith Robert, pt is psych cleared and can be discharged.  Disposition Initial Assessment Completed for this Encounter: Yes  On Site Evaluation by:   Reviewed with Physician:    Foy Guadalajara 07/07/2020 6:08 PM

## 2020-07-07 NOTE — ED Notes (Signed)
Pt states here because he told someone at [facility] that he was going to shoot the man who killed his brother. Pt states "I meant that shit too". Pt also noted to be paranoid. Pt not wanting staff to leave him despite staff being within line of sight of patient. Pt states "they are going to get me, just sit here and you'll see they are going to get me". Pt otherwise calm and cooperative at this time.

## 2020-07-29 IMAGING — CT CT MAXILLOFACIAL W/O CM
3 series · 16 of 47 positions shown, 19 images · non-contrast
Comparison: None.

CLINICAL DATA: Facial trauma

EXAM:
CT MAXILLOFACIAL WITHOUT CONTRAST
TECHNIQUE: Multidetector CT imaging of the maxillofacial structures was
performed. Multiplanar CT image reconstructions were also generated.

[Series 3: facialbone 2.0 st · axial · 0.37mm/px · z∈[-200,-24]mm · 10 of 104 slices shown, 13 images]
[im 8/104  brain]
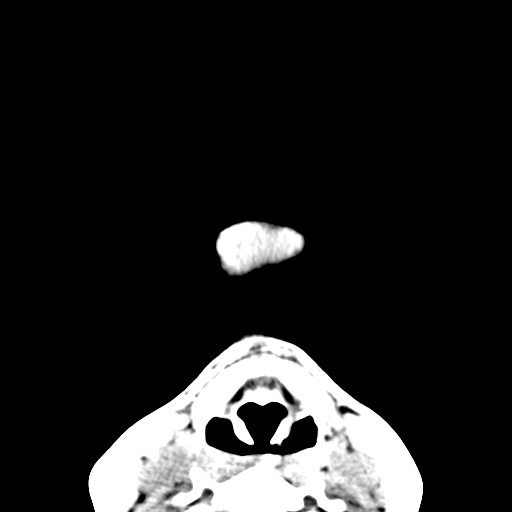
[im 8/104  bone]
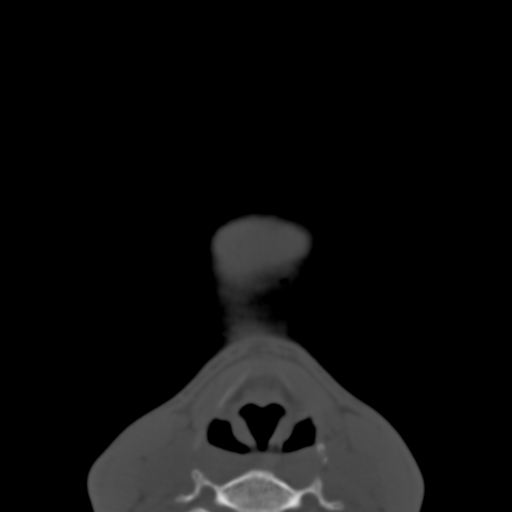
[im 18/104  bone]
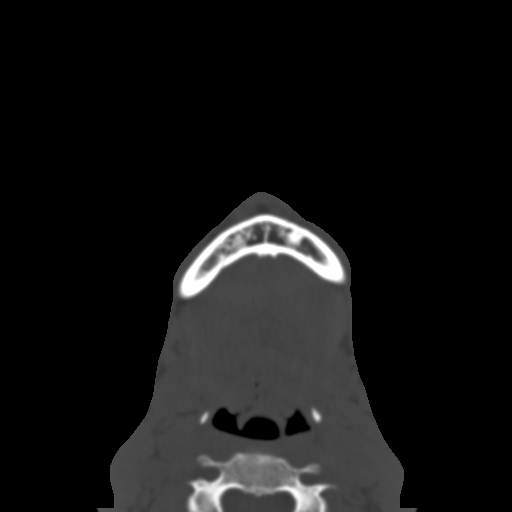
[im 29/104  bone]
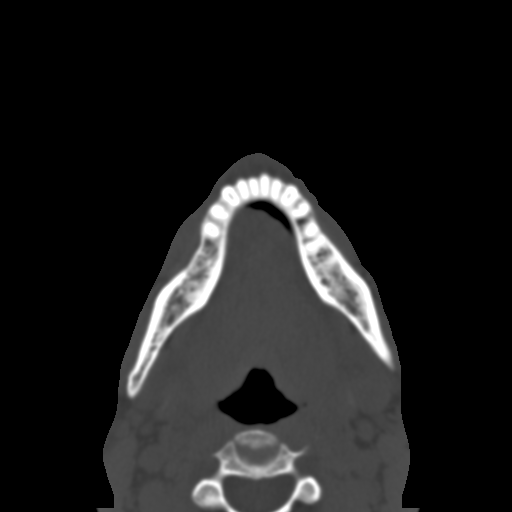
[im 36/104  bone]
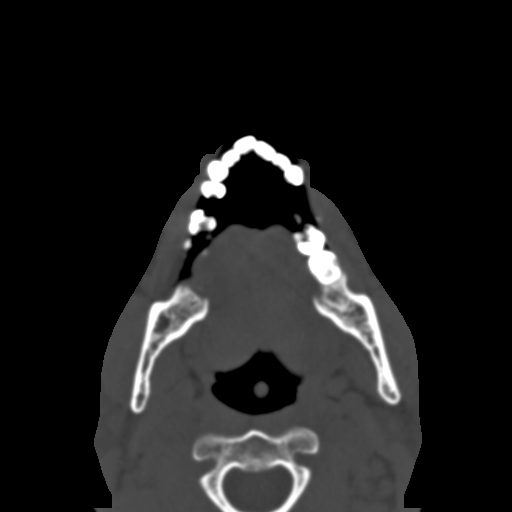
[im 47/104  brain]
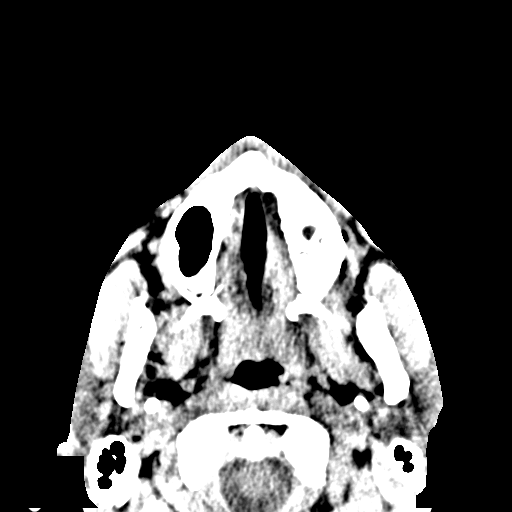
[im 47/104  bone]
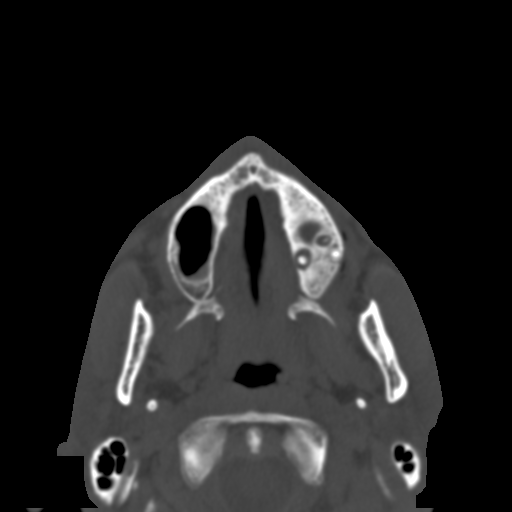
[im 57/104  bone]
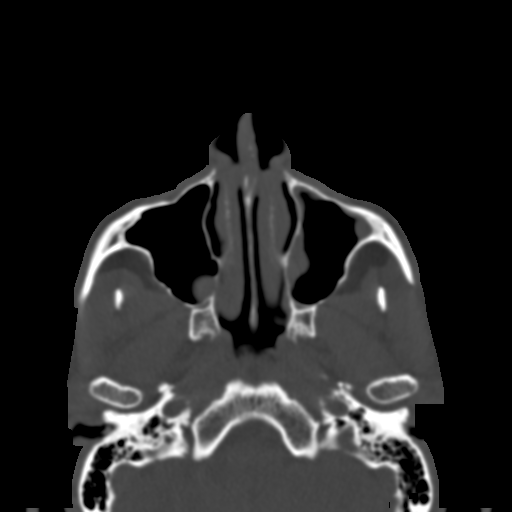
[im 68/104  bone]
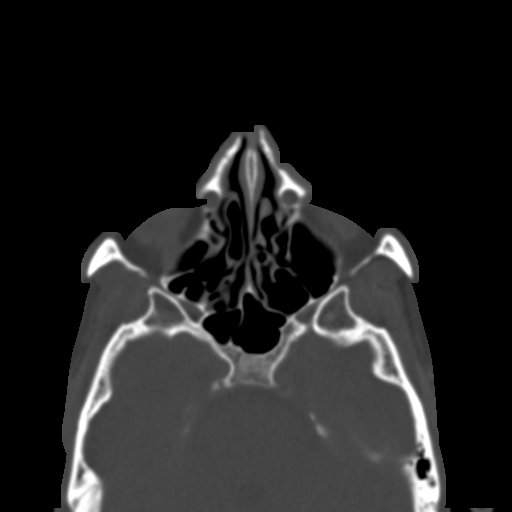
[im 79/104  bone]
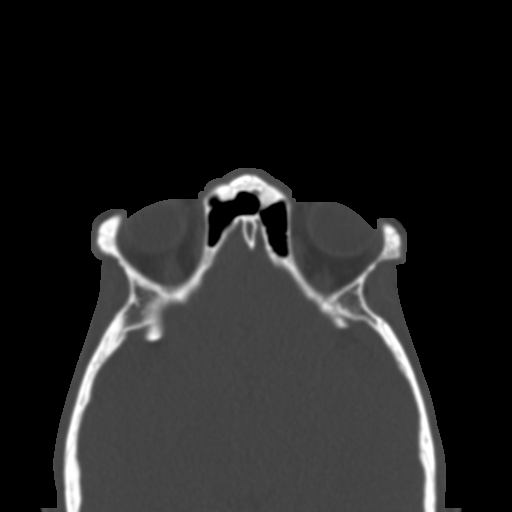
[im 86/104  brain]
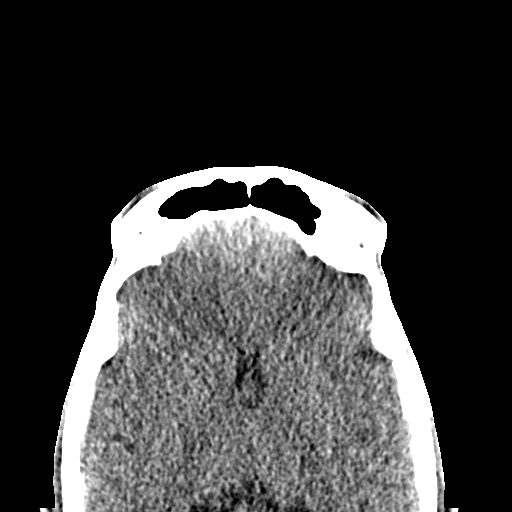
[im 86/104  bone]
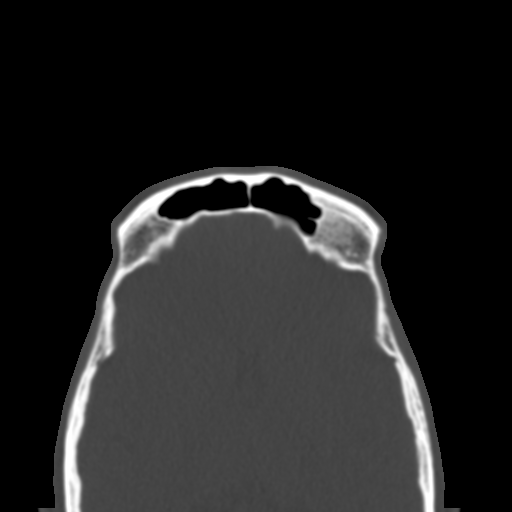
[im 96/104  bone]
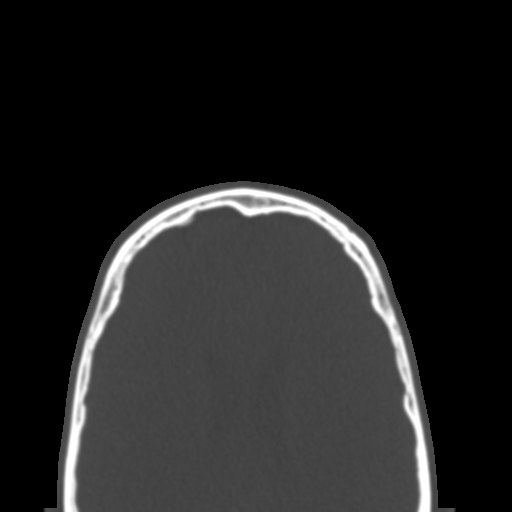

[Series 7: facialbone 2.0 cor st · coronal · 0.38mm/px · 3 of 76 slices shown]
[im 26/76  bone]
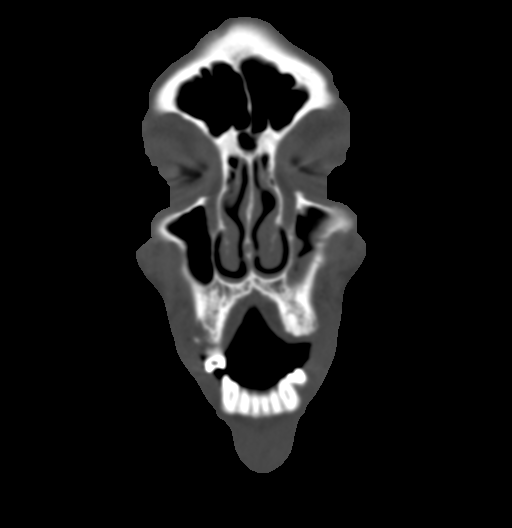
[im 34/76  bone]
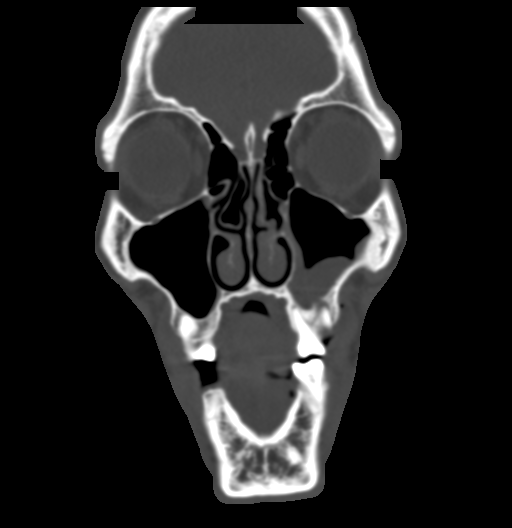
[im 42/76  bone]
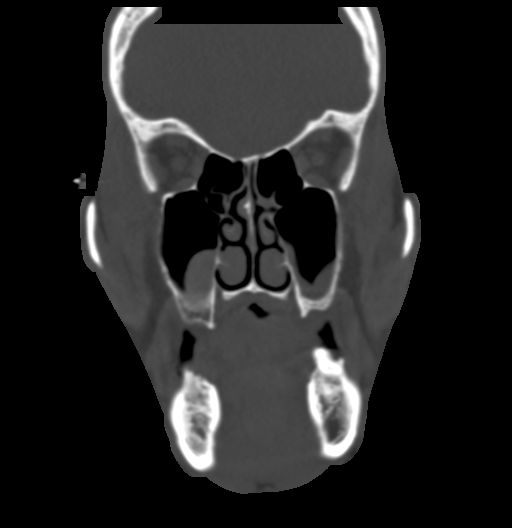

[Series 8: facialbone 2.0 sag st · sagittal · 0.38mm/px · 3 of 84 slices shown]
[im 28/84  bone]
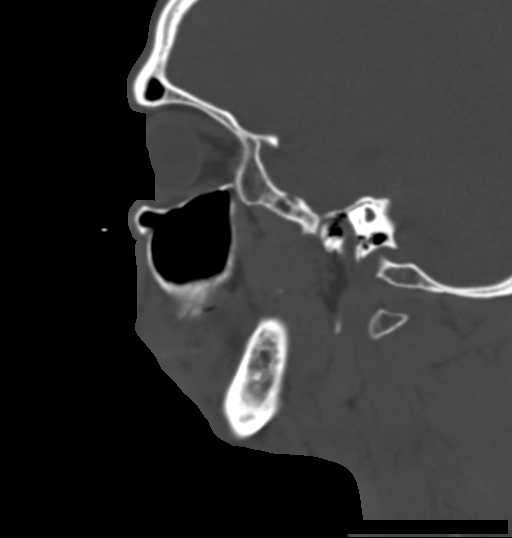
[im 42/84  bone]
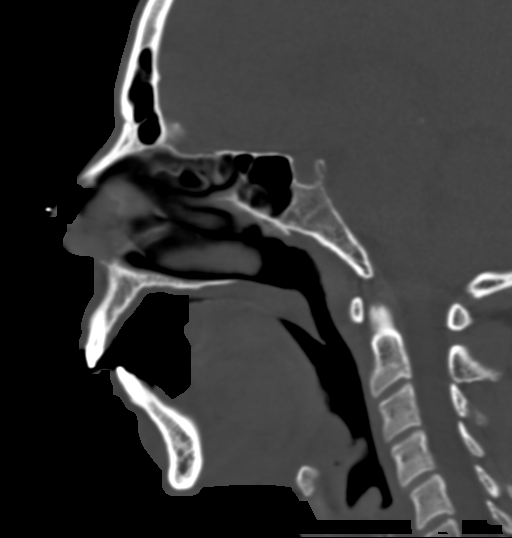
[im 56/84  bone]
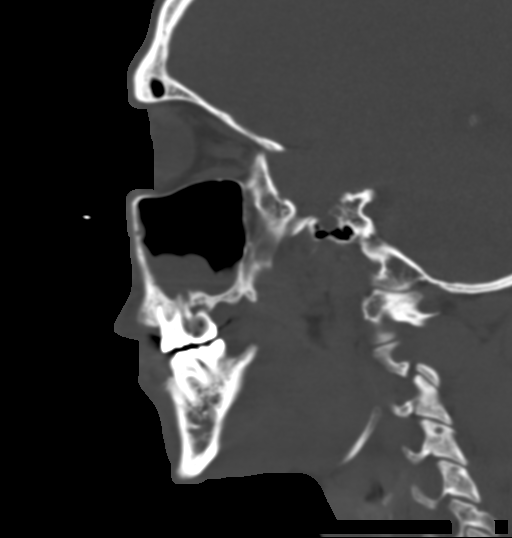

[16 of 47 positions shown; findings below may reference images not displayed]

FINDINGS: Osseous:

--Complex facial fracture types: No LeFort, zygomaticomaxillary
complex or nasoorbitoethmoidal fracture.

--Simple fracture types: None.

--Mandible, hard palate and teeth: No mandibular fracture. Normal
hard palate. Poor dentition.

Orbits: The globes are intact. Normal appearance of the intra- and
extraconal fat. Symmetric extraocular muscles.

Sinuses: No fluid levels or advanced mucosal thickening.

Soft tissues: Normal visualized extracranial soft tissues.

Limited intracranial: Normal.
IMPRESSION: No facial or mandibular fracture.

## 2020-09-09 ENCOUNTER — Inpatient Hospital Stay (HOSPITAL_COMMUNITY)
Admission: AD | Admit: 2020-09-09 | Discharge: 2020-09-10 | DRG: 885 | Disposition: A | Discharge status: Home / Self Care | Payer: Medicare Other | Source: Intra-hospital | Attending: Psychiatry | Admitting: Psychiatry

## 2020-09-09 ENCOUNTER — Encounter (HOSPITAL_COMMUNITY): Payer: Self-pay

## 2020-09-09 ENCOUNTER — Emergency Department (HOSPITAL_COMMUNITY)
Admission: EM | Admit: 2020-09-09 | Discharge: 2020-09-09 | Disposition: A | Discharge status: Other Acute Inpatient Hospital | Payer: Medicare Other | Attending: Emergency Medicine | Admitting: Emergency Medicine

## 2020-09-09 ENCOUNTER — Other Ambulatory Visit: Payer: Self-pay

## 2020-09-09 DIAGNOSIS — J45909 Unspecified asthma, uncomplicated: Secondary | ICD-10-CM | POA: Insufficient documentation

## 2020-09-09 DIAGNOSIS — F250 Schizoaffective disorder, bipolar type: Principal | ICD-10-CM | POA: Diagnosis present

## 2020-09-09 DIAGNOSIS — F15951 Other stimulant use, unspecified with stimulant-induced psychotic disorder with hallucinations: Secondary | ICD-10-CM | POA: Diagnosis present

## 2020-09-09 DIAGNOSIS — Y907 Blood alcohol level of 200-239 mg/100 ml: Secondary | ICD-10-CM | POA: Insufficient documentation

## 2020-09-09 DIAGNOSIS — F1210 Cannabis abuse, uncomplicated: Secondary | ICD-10-CM | POA: Insufficient documentation

## 2020-09-09 DIAGNOSIS — F1220 Cannabis dependence, uncomplicated: Secondary | ICD-10-CM | POA: Diagnosis present

## 2020-09-09 DIAGNOSIS — F209 Schizophrenia, unspecified: Secondary | ICD-10-CM | POA: Diagnosis present

## 2020-09-09 DIAGNOSIS — F17210 Nicotine dependence, cigarettes, uncomplicated: Secondary | ICD-10-CM | POA: Insufficient documentation

## 2020-09-09 DIAGNOSIS — R45850 Homicidal ideations: Secondary | ICD-10-CM | POA: Insufficient documentation

## 2020-09-09 DIAGNOSIS — Z20822 Contact with and (suspected) exposure to covid-19: Secondary | ICD-10-CM | POA: Insufficient documentation

## 2020-09-09 DIAGNOSIS — F1410 Cocaine abuse, uncomplicated: Secondary | ICD-10-CM | POA: Insufficient documentation

## 2020-09-09 LAB — CBC WITH DIFFERENTIAL/PLATELET
Abs Immature Granulocytes: 0.01 10*3/uL (ref 0.00–0.07)
Basophils Absolute: 0 10*3/uL (ref 0.0–0.1)
Basophils Relative: 1 %
Eosinophils Absolute: 0.2 10*3/uL (ref 0.0–0.5)
Eosinophils Relative: 2 %
HCT: 38.5 % — ABNORMAL LOW (ref 39.0–52.0)
Hemoglobin: 12.4 g/dL — ABNORMAL LOW (ref 13.0–17.0)
Immature Granulocytes: 0 %
Lymphocytes Relative: 42 %
Lymphs Abs: 2.7 10*3/uL (ref 0.7–4.0)
MCH: 29.9 pg (ref 26.0–34.0)
MCHC: 32.2 g/dL (ref 30.0–36.0)
MCV: 92.8 fL (ref 80.0–100.0)
Monocytes Absolute: 0.5 10*3/uL (ref 0.1–1.0)
Monocytes Relative: 8 %
Neutro Abs: 2.9 10*3/uL (ref 1.7–7.7)
Neutrophils Relative %: 47 %
Platelets: 262 10*3/uL (ref 150–400)
RBC: 4.15 MIL/uL — ABNORMAL LOW (ref 4.22–5.81)
RDW: 13.9 % (ref 11.5–15.5)
WBC: 6.4 10*3/uL (ref 4.0–10.5)
nRBC: 0 % (ref 0.0–0.2)

## 2020-09-09 LAB — BASIC METABOLIC PANEL
Anion gap: 9 (ref 5–15)
BUN: 16 mg/dL (ref 6–20)
CO2: 26 mmol/L (ref 22–32)
Calcium: 8.9 mg/dL (ref 8.9–10.3)
Chloride: 104 mmol/L (ref 98–111)
Creatinine, Ser: 0.91 mg/dL (ref 0.61–1.24)
GFR calc Af Amer: >60 mL/min (ref >60)
GFR calc non Af Amer: >60 mL/min (ref >60)
Glucose, Bld: 104 mg/dL — ABNORMAL HIGH (ref 70–99)
Potassium: 3.8 mmol/L (ref 3.5–5.1)
Sodium: 139 mmol/L (ref 135–145)

## 2020-09-09 LAB — RAPID URINE DRUG SCREEN, HOSP PERFORMED
Amphetamines: NOT DETECTED
Barbiturates: NOT DETECTED
Benzodiazepines: NOT DETECTED
Cocaine: NOT DETECTED
Opiates: NOT DETECTED
Tetrahydrocannabinol: POSITIVE — AB

## 2020-09-09 LAB — ETHANOL: Alcohol, Ethyl (B): <10 mg/dL (ref <10)

## 2020-09-09 LAB — SARS CORONAVIRUS 2 BY RT PCR (HOSPITAL ORDER, PERFORMED IN ~~LOC~~ HOSPITAL LAB): SARS Coronavirus 2: NEGATIVE

## 2020-09-09 MED ORDER — BENZTROPINE MESYLATE 1 MG PO TABS
1.0000 mg | ORAL_TABLET | Freq: Two times a day (BID) | ORAL | Status: DC
  Administered 2020-09-09 – 2020-09-10 (×2): 1 mg via ORAL
  Filled 2020-09-09 (×7): qty 1

## 2020-09-09 MED ORDER — DIVALPROEX SODIUM 500 MG PO DR TAB
500.0000 mg | DELAYED_RELEASE_TABLET | Freq: Two times a day (BID) | ORAL | Status: DC
  Administered 2020-09-09 – 2020-09-10 (×2): 500 mg via ORAL
  Filled 2020-09-09 (×7): qty 1

## 2020-09-09 MED ORDER — MAGNESIUM HYDROXIDE 400 MG/5ML PO SUSP: 30.0000 mL | Freq: Every day | ORAL | Status: DC | PRN

## 2020-09-09 MED ORDER — ACETAMINOPHEN 325 MG PO TABS: 650.0000 mg | ORAL_TABLET | Freq: Four times a day (QID) | ORAL | Status: DC | PRN

## 2020-09-09 MED ORDER — OLANZAPINE 10 MG PO TABS
10.0000 mg | ORAL_TABLET | Freq: Every day | ORAL | Status: DC
  Administered 2020-09-09: 10 mg via ORAL
  Filled 2020-09-09 (×3): qty 1

## 2020-09-09 MED ORDER — HYDROXYZINE HCL 25 MG PO TABS
25.0000 mg | ORAL_TABLET | Freq: Three times a day (TID) | ORAL | Status: DC | PRN
  Administered 2020-09-09: 25 mg via ORAL
  Filled 2020-09-09: qty 1

## 2020-09-09 MED ORDER — TRAZODONE HCL 50 MG PO TABS: 50.0000 mg | ORAL_TABLET | Freq: Every evening | ORAL | Status: DC | PRN

## 2020-09-09 MED ORDER — OLANZAPINE 10 MG PO TABS
10.0000 mg | ORAL_TABLET | Freq: Every day | ORAL | Status: DC
  Administered 2020-09-10: 10 mg via ORAL
  Filled 2020-09-09 (×3): qty 1

## 2020-09-09 MED ORDER — ALUM & MAG HYDROXIDE-SIMETH 200-200-20 MG/5ML PO SUSP: 30.0000 mL | ORAL | Status: DC | PRN

## 2020-09-09 NOTE — ED Provider Notes (Signed)
[facility]-EMERGENCY DEPT Provider Note   CSN: [id number] Arrival date & time: 09/09/20  0010     History No chief complaint on file.   Peter Wilson is a 44 y.o. male.  Patient to ED with complaint of HI, wanting to kill others. He denies SI/AVH. No other complaints. He reports being off his medications for 2 months.   The history is provided by the patient. No language interpreter was used.       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  Social History   Tobacco Use   Smoking status: Current Every Day Smoker    Packs/day: 2.00    Types: Cigarettes   Smokeless tobacco: Never Used   Tobacco comment: Unknown   Substance Use Topics   Alcohol use: Yes    Alcohol/week: 0.0 standard drinks    Comment: Unknown    Drug use: Yes    Frequency: 7.0 times per week    Types: Marijuana, Cocaine    Comment: Unknown     Home Medications Prior to Admission medications   Medication Sig Start Date End Date Taking? Authorizing Provider  benztropine (COGENTIN) 1 MG tablet Take 1 tablet (1 mg total) by mouth 2 (two) times daily for 21 days. 07/07/20 07/28/20  Phineas Semen, MD  divalproex (DEPAKOTE) 500 MG DR tablet Take 1 tablet (500  mg total) by mouth every 12 (twelve) hours. Patient taking differently: Take 500 mg by mouth 2 (two) times daily.  06/20/20   Aldean Baker, NP  haloperidol decanoate (HALDOL DECANOATE) 100 MG/ML injection Inject into the muscle. 06/21/20   [provider]  OLANZapine (ZYPREXA) 10 MG tablet Take 1 tablet (10 mg total) by mouth daily. 06/20/20   Aldean Baker, NP  OLANZapine (ZYPREXA) 20 MG tablet Take 1 tablet (20 mg total) by mouth at bedtime. 06/20/20   Aldean Baker, NP  silver sulfADIAZINE (SILVADENE) 1 % cream Apply topically 2 (two) times daily. 06/20/20   Aldean Baker, NP    Allergies    Penicillins  Review of Systems   Review of Systems  Constitutional: Negative for chills and fever.  HENT: Negative.   Respiratory: Negative.   Cardiovascular: Negative.   Gastrointestinal: Negative.   Musculoskeletal: Negative.   Skin: Negative.   Neurological: Negative.   Psychiatric/Behavioral: Negative for suicidal ideas.       See HPI    Physical Exam Updated Vital Signs BP 101/66 (BP Location: Right Arm)    Pulse 72    Temp 98 F (36.7 C) (Oral)    Resp 15    Ht 6\' 1"  (1.854 m)    Wt 79.4 kg    SpO2 97%    BMI 23.09 kg/m   Physical  Exam Constitutional:      Appearance: He is well-developed.  HENT:     Head: Normocephalic.  Cardiovascular:     Rate and Rhythm: Normal rate and regular rhythm.  Pulmonary:     Effort: Pulmonary effort is normal.     Breath sounds: Normal breath sounds.  Abdominal:     General: Bowel sounds are normal.     Palpations: Abdomen is soft.     Tenderness: There is no abdominal tenderness. There is no guarding or rebound.  Musculoskeletal:        General: Normal range of motion.     Cervical back: Normal range of motion and neck supple.  Skin:    General: Skin is warm and dry.     Findings: No rash.  Neurological:     Mental Status: He is alert and oriented to person, place, and time.  Psychiatric:        Attention and Perception: He  does not perceive auditory or visual hallucinations.        Mood and Affect: Affect is blunt.        Behavior: Behavior is cooperative.        Thought Content: Thought content includes homicidal ideation.     ED Results / Procedures / Treatments   Labs (all labs ordered are listed, but only abnormal results are displayed) Labs Reviewed  CBC WITH DIFFERENTIAL/PLATELET  BASIC METABOLIC PANEL  RAPID URINE DRUG SCREEN, HOSP PERFORMED  ETHANOL    EKG None  Radiology No results found.  Procedures Procedures (including critical care time)  Medications Ordered in ED Medications - No data to display  ED Course  I have reviewed the triage vital signs and the nursing notes.  Pertinent labs & imaging results that were available during my care of the patient were reviewed by me and considered in my medical decision making (see chart for details).    MDM Rules/Calculators/A&P                          Patient to ED with HI, denies SI/AVH. Out of medications for 2 months.   He is calm and cooperative. TTS will be requested to determine appropriate disposition.   6:05 - patient still waiting TTS consultation. Patient care signed out to oncoming provider.   Final Clinical Impression(s) / ED Diagnoses Final diagnoses:  None   1. HI  Rx / DC Orders ED Discharge Orders    None       Danne Harbor 09/09/20 0604    Palumbo, April, MD 09/09/20 [phone]

## 2020-09-09 NOTE — BH Assessment (Signed)
Tele Assessment Note   Patient Name: Peter Wilson MRN: [id number] Referring Physician: Elpidio Anis  Location of Patient: [place] Location of Provider: [facility]  Peter Wilson is an 44 y.o. male presenting to [facility] voluntarily for homicidal thoughts. Pt states that his step son has been "running his bitch ass mouth". Pt reports homicidal thoughts and stated "I will stab him in the neck". Pt did not report the reason him and his step son was arguing but reports they have been arguing for the past couple of days. Pt states that him and his step son have been living with his sister for the past two weeks and before moving in with his sister he was living in [place]. Pt stated that he has been without medications for the past two months. Pt stated that he is receiving ACTT services with [facility] but he has not been engaging with them.  Pt states that he has a history of inpatient treatment and the last time he was inpatient was about nine months ago. Pt was IVC'd at [facility] on 07/07/2020 due to endorsing homicidal ideations while at [facility]. Pt was also seen at [facility] for similar presentations.   Pt reports command auditory hallucinations telling him to "kill that mother fucker" and visual hallucinations of seeing dead people. Pt endorsed depressive symptoms of difficulty sleeping, feeling helpless, hopeless and worthless, irritable and difficulty concentrating. Pt reports a history of substance use (cocaine and marijuana). Pt reports smoking an ounce of marijuana and typically uses when he can. Pt became somewhat irritated by clinician questions. Pt denied having suicidal ideations during assessment but reported on a few occasions' thoughts killing his step son. Pt reports wanting inpatient treatment today. Pt agreed to receiving another ACTT provider to help with his mental health and other biopsychosocial needs.  Pt was oriented x5, he was lying in the bed on his stomach so he did  not make eye contact with clinician. Pt speech was somewhat slurred but normal in tone, he denied suicidal ideations but endorsed auditory/visual hallucinations and homicidal ideations.   Diagnosis: F25.0 Schizophrenia; Marijuana use, Cocaine use     Past Medical History:  Past Medical History:  Diagnosis Date  . Marijuana abuse   . Schizophrenia, paranoid (HCC)   . Tardive dyskinesia   . Tobacco abuse     Past Surgical History:  Procedure Laterality Date  . NO PAST SURGERIES      Family History: No family history on file.  Social History:  reports that he has been smoking cigarettes. He has been smoking about 2.00 packs per day. He has never used smokeless tobacco. He reports current alcohol use. He reports current drug use. Frequency: 7.00 times per week. Drugs: Marijuana and Cocaine.  Additional Social History:  Alcohol / Drug Use Pain Medications: See MAR Prescriptions: SEE MAR Over the Counter: See MAR History of alcohol / drug use?: Yes Longest period of sobriety (when/how long): Unknown Negative Consequences of Use: Financial, Personal relationships Withdrawal Symptoms: Aggressive/Assaultive, Agitation Substance #1 Name of Substance 1: Marijuana 1 - Age of First Use: 14 1 - Amount (size/oz): ounce 1 - Frequency: as available 1 - Duration: ongoing 1 - Last Use / Amount: 09/08/2020 Substance #2 Name of Substance 2: Cocaine 2 - Age of First Use: Adolescent 2 - Amount (size/oz): Varied 2 - Frequency: Daily as available 2 - Duration: Ongoing 2 - Last Use / Amount: 09/08/2020  CIWA: CIWA-Ar BP: 101/66 Pulse Rate: 72 COWS:  Allergies:  Allergies  Allergen Reactions  . Penicillins Rash    Did it involve swelling of the face/tongue/throat, SOB, or low BP? U Did it involve sudden or severe rash/hives, skin peeling, or any reaction on the inside of your mouth or nose? Y  Did you need to seek medical attention at a hospital or doctor's office? Y When did it last  happen?childhood If all above answers are "NO", may proceed with cephalosporin use.     Home Medications: (Not in a hospital admission)   OB/GYN Status:  No LMP for male patient.  General Assessment Data Location of Assessment: [facility] ED TTS Assessment: In system Is this a Tele or Face-to-Face Assessment?: Tele Assessment Is this an Initial Assessment or a Re-assessment for this encounter?: Initial Assessment Patient Accompanied by:: N/A Living Arrangements: Other (Comment) (Living with sister for the past two weeks) What gender do you identify as?: Male Date Telepsych consult ordered in [facility]: 09/09/20 Time Telepsych consult ordered in [facility]: 0111 Marital status: Single Living Arrangements: Other relatives Can pt return to current living arrangement?: No Admission Status: Voluntary Is patient capable of signing voluntary admission?: Yes Referral Source: Self/Family/Friend Insurance type: Medicare     Crisis Care Plan Living Arrangements: Other relatives Name of Psychiatrist:  ([facility] ) Name of Therapist:  ([facility] )  Education Status Is patient currently in school?: No  Risk to self with the past 6 months Suicidal Ideation: No Has patient been a risk to self within the past 6 months prior to admission? : No Suicidal Intent: No Has patient had any suicidal intent within the past 6 months prior to admission? : No Is patient at risk for suicide?: No Suicidal Plan?: No Has patient had any suicidal plan within the past 6 months prior to admission? : No Access to Means: No What has been your use of drugs/alcohol within the last 12 months?:  (Cocaine and marijuana last used 09/08/2020) Previous Attempts/Gestures: No Intentional Self Injurious Behavior: None Family Suicide History: Unknown Recent stressful life event(s):  (Homeless) Persecutory voices/beliefs?: No Depression: Yes Depression Symptoms: Insomnia, Feeling worthless/self  pity, Feeling angry/irritable Substance abuse history and/or treatment for substance abuse?: Yes Suicide prevention information given to non-admitted patients: Not applicable  Risk to Others within the past 6 months Homicidal Ideation: Yes-Currently Present Does patient have any lifetime risk of violence toward others beyond the six months prior to admission? : Yes (comment) (History of ED visits for HI) Thoughts of Harm to Others: Yes-Currently Present Comment - Thoughts of Harm to Others:  (Thoughts of stabbing his step son with a knife) Current Homicidal Intent: Yes-Currently Present Current Homicidal Plan: Yes-Currently Present Describe Current Homicidal Plan:  (Plans on stabbing step son with a knife.) Access to Homicidal Means: Yes Identified Victim:  (Step son- does not know where his step son is at this time) Assessment of Violence: In past 6-12 months Violent Behavior Description:  (Presented to [facility] ED on 7/14 with HI) Does patient have access to weapons?: No (Not at this time) Criminal Charges Pending?: No Does patient have a court date: No Is patient on probation?: Unknown  Psychosis Hallucinations: Auditory, Visual Delusions: None noted  Mental Status Report Appearance/Hygiene: In scrubs Eye Contact: Poor Motor Activity: Unremarkable Speech: Slurred Level of Consciousness: Sleeping, Drowsy Mood: Depressed, Irritable Affect: Flat Anxiety Level: None Thought Processes: Coherent Judgement: Unimpaired Orientation: Person, Place, Time, Situation, Appropriate for developmental age Obsessive Compulsive Thoughts/Behaviors: Minimal  Cognitive Functioning Concentration: Fair Memory: Unable to Assess  Is patient IDD: No Insight: Fair Impulse Control: Unable to Assess Appetite: Fair Have you had any weight changes? : No Change Sleep: Decreased  ADLScreening [facility] Assessment Services) Patient's cognitive ability adequate to safely complete daily activities?:  Yes Patient able to express need for assistance with ADLs?: Yes Independently performs ADLs?: Yes (appropriate for developmental age)  Prior Inpatient Therapy Prior Inpatient Therapy: Yes Prior Therapy Dates:  ("9 months ago") Prior Therapy Facilty/Provider(s):  ([facility])  Prior Outpatient Therapy Prior Outpatient Therapy: No Prior Therapy Dates:  (UTA) Does patient have an ACCT team?: Yes Does patient have Intensive In-House Services?  : No Does patient have [facility] services? : No Does patient have P4CC services?: Unknown  ADL Screening (condition at time of admission) Patient's cognitive ability adequate to safely complete daily activities?: Yes Is the patient deaf or have difficulty hearing?: No Does the patient have difficulty seeing, even when wearing glasses/contacts?: No Does the patient have difficulty concentrating, remembering, or making decisions?: No Patient able to express need for assistance with ADLs?: Yes Does the patient have difficulty dressing or bathing?: Yes Independently performs ADLs?: Yes (appropriate for developmental age) Does the patient have difficulty walking or climbing stairs?: No  Home Assistive Devices/Equipment Home Assistive Devices/Equipment: None  Therapy Consults (therapy consults require a physician order) PT Evaluation Needed: No OT Evalulation Needed: No SLP Evaluation Needed: No Abuse/Neglect Assessment (Assessment to be complete while patient is alone) Abuse/Neglect Assessment Can Be Completed: Unable to assess, patient is non-responsive or altered mental status Values / Beliefs Cultural Requests During Hospitalization: None Spiritual Requests During Hospitalization: None Consults Spiritual Care Consult Needed: No Transition of Care Team Consult Needed: No Advance Directives (For Healthcare) Does Patient Have a Medical Advance Directive?: No Would patient like information on creating a medical advance  directive?: No - Patient declined Nutrition Screen- [facility] Adult/[facility] Has the patient recently lost weight without trying?: No Has the patient been eating poorly because of a decreased appetite?: No Malnutrition Screening Tool Score: 0        Disposition: Per Berneice Heinrich, NP, patient meets inpatient admission criteria    This service was provided via telemedicine using a 2-way, interactive audio and video technology.  Names of all persons participating in this telemedicine service and their role in this encounter. Name: Peter Wilson  Role: Patient   Name: Darcey Nora  Role: Counselor   Name: Josephina Gip  Role: Counselor   Name:  Role:     Arnoldo Lenis Tashea Othman 09/09/2020 11:00 AM

## 2020-09-09 NOTE — ED Triage Notes (Signed)
Patient arrived via ptar after stating he wants a psych eval and demanded to go to the hospital or he will "kill someone with their own gun" Reports being off his meds for 2 months.

## 2020-09-09 NOTE — ED Notes (Addendum)
Safe transport called pt brought out to their vehicle by staff for transport to [facility]. Current condition stable behavior self controlled althought affect is flat and mood is labile but behavior is cooperative.

## 2020-09-09 NOTE — ED Provider Notes (Signed)
Patient being seen for homicidal ideation.  Patient will require inpatient care.  Patient needs a Covid test to facilitate this.  It has been ordered.  TTS and behavioral health are looking for placement.   Vanetta Mulders, MD 09/09/20 [phone]

## 2020-09-09 NOTE — BH Assessment (Signed)
[facility] Assessment Progress Note  Per Berneice Heinrich, FNP, this voluntary pt requires psychiatric hospitalization at this time.  Gretta Arab, RN, [facility] has assigned pt to [id number] after he tests negative for Covid-19.  This Clinical research associate has discussed this with EDP Vanetta Mulders, MD, who agrees to order Covid test.  Pt has signed Voluntary Admission and Consent for Treatment, as well as Consent to Release Information to no one, and signed forms have been faxed to [facility].  Please note that pt denies receiving ACT Team services from [facility] or any other outpatient provider at this time.  Pt's nurse, Addison Naegeli, has been notified of disposition, and agrees to send original paperwork along with pt via [facility], and to call report to [phone].  Doylene Canning, [place] [facility] Coordinator [phone]

## 2020-09-09 NOTE — ED Notes (Signed)
BH transport called

## 2020-09-10 ENCOUNTER — Encounter (HOSPITAL_COMMUNITY): Payer: Self-pay | Admitting: Psychiatry

## 2020-09-10 ENCOUNTER — Other Ambulatory Visit: Payer: Self-pay

## 2020-09-10 DIAGNOSIS — F250 Schizoaffective disorder, bipolar type: Principal | ICD-10-CM

## 2020-09-10 MED ORDER — BENZTROPINE MESYLATE 1 MG PO TABS
1.0000 mg | ORAL_TABLET | Freq: Two times a day (BID) | ORAL | 0 refills | Status: DC
Start: 2020-09-10 — End: 2022-09-15

## 2020-09-10 MED ORDER — OLANZAPINE 10 MG PO TABS
20.0000 mg | ORAL_TABLET | Freq: Every day | ORAL | Status: DC
  Filled 2020-09-10 (×2): qty 2

## 2020-09-10 MED ORDER — DIVALPROEX SODIUM 500 MG PO DR TAB: 500.0000 mg | DELAYED_RELEASE_TABLET | Freq: Two times a day (BID) | ORAL | 0 refills | Status: DC

## 2020-09-10 MED ORDER — OLANZAPINE 20 MG PO TABS
20.0000 mg | ORAL_TABLET | Freq: Every day | ORAL | 0 refills | Status: DC
Start: 2020-09-10 — End: 2021-12-15

## 2020-09-10 MED ORDER — TRAZODONE HCL 50 MG PO TABS
50.0000 mg | ORAL_TABLET | Freq: Every evening | ORAL | 0 refills | Status: DC | PRN
Start: 2020-09-10 — End: 2021-12-15

## 2020-09-10 MED ORDER — HYDROXYZINE HCL 25 MG PO TABS
25.0000 mg | ORAL_TABLET | Freq: Three times a day (TID) | ORAL | 0 refills | Status: DC | PRN
Start: 2020-09-10 — End: 2021-12-15

## 2020-09-10 MED ORDER — OLANZAPINE 10 MG PO TABS: 10.0000 mg | ORAL_TABLET | Freq: Every day | ORAL | 0 refills | Status: DC

## 2020-09-10 NOTE — H&P (Signed)
Psychiatric Admission Assessment Adult  Patient Identification: Peter Wilson MRN:  [id number] Date of Evaluation:  09/10/2020 Chief Complaint:  Schizophrenia (HCC) [F20.9] Principal Diagnosis: Schizoaffective disorder, bipolar type (HCC) Diagnosis:  Principal Problem:   Schizoaffective disorder, bipolar type (HCC) Active Problems:   Cannabis use disorder, moderate, dependence (HCC)   Schizophrenia (HCC)   Amphetamine and psychostimulant-induced psychosis with hallucinations (HCC)  History of Present Illness: Patient is seen and examined.  Patient is a 44 year old male with a past psychiatric history significant for psychosis in the past that is felt to be secondary to amphetamine and psychostimulant causes, history of schizoaffective disorder, and as well history of marijuana use disorder who is very familiar to this medical service.  The patient presented to the [facility] emergency department on 09/09/2020 requesting a psychiatric evaluation.  The patient stated at that time he was having homicidal ideation towards his stepson.  He stated that he had been "running his bitch ass mouth" and that he was having thoughts of harming him.  He stated he and his stepson had been living with his sister for the past 2 weeks.  Prior to that he had been living in [facility].  He stated at the time he was seen in the [facility] emergency department he had been without his medications for the last 2 months.  He told the folks in the emergency department that he had been receiving [facility]T from [facility], but on the morning of my evaluation he stated he had "fired them".  The patient has been in multiple emergency rooms over the last several months.  Since his admission here on 6/23 he has been in the emergency department at several hospitals at least 10-12 times.  This seems to be a recurrent problem.  He becomes homicidal.  Most of his hospitalizations have been in  the observation unit in the emergency departments at these facilities.  This morning he stated that he just 1 to get his medications right.  He stated he had received clonazepam from some emergency room doctor, and I told him I would be unable to do that.  He is understanding of that.  He denied any auditory or visual hallucinations.  He denied any suicidal or homicidal ideation.  We discussed contacting his act team, and his primary objective this morning is to get a ride to [place], [place] so he can "get out of this place".  Associated Signs/Symptoms: Depression Symptoms:  insomnia, psychomotor agitation, disturbed sleep, Duration of Depression Symptoms: No data recorded (Hypo) Manic Symptoms:  Delusions, Impulsivity, Irritable Mood, Labiality of Mood, Anxiety Symptoms:  Excessive Worry, Psychotic Symptoms:  Delusions, Paranoia, Duration of Psychotic Symptoms: No data recorded PTSD Symptoms: Negative Total Time spent with patient: 45 minutes  Past Psychiatric History: Patient has a history of multiple psychiatric hospitalizations.  He has multiple observation stays.  The electronic medical record revealed at least 10-20 emergency room visits in the last 12 months.  He has been followed by ACTT services.  Is the patient at risk to self? No.  Has the patient been a risk to self in the past 6 months? No.  Has the patient been a risk to self within the distant past? No.  Is the patient a risk to others? Yes.    Has the patient been a risk to others in the past 6 months? Yes.    Has the patient been a risk to others within the distant past? Yes.  Prior Inpatient Therapy:   Prior Outpatient Therapy:    Alcohol Screening: 1. How often do you have a drink containing alcohol?: 4 or more times a week 2. How many drinks containing alcohol do you have on a typical day when you are drinking?: 5 or 6 3. How often do you have six or more drinks on one occasion?: Daily or almost  daily AUDIT-C Score: 10 4. How often during the last year have you found that you were not able to stop drinking once you had started?: Never 5. How often during the last year have you failed to do what was normally expected from you because of drinking?: Never 6. How often during the last year have you needed a first drink in the morning to get yourself going after a heavy drinking session?: Never 7. How often during the last year have you had a feeling of guilt of remorse after drinking?: Never 8. How often during the last year have you been unable to remember what happened the night before because you had been drinking?: Never 9. Have you or someone else been injured as a result of your drinking?: No 10. Has a relative or friend or a doctor or another health worker been concerned about your drinking or suggested you cut down?: No Alcohol Use Disorder Identification Test Final Score (AUDIT): 10 Alcohol Brief Interventions/Follow-up: Brief Advice Substance Abuse History in the last 12 months:  Yes.   Consequences of Substance Abuse: Medical Consequences:  Clearly contributed to multiple psychiatric visits as well as ER visits. Previous Psychotropic Medications: Yes  Psychological Evaluations: Yes  Past Medical History:  Past Medical History:  Diagnosis Date  . Marijuana abuse   . Schizophrenia, paranoid (HCC)   . Tardive dyskinesia   . Tobacco abuse     Past Surgical History:  Procedure Laterality Date  . NO PAST SURGERIES     Family History: History reviewed. No pertinent family history. Family Psychiatric  History: No pertinent family psychiatric history. Tobacco Screening: Have you used any form of tobacco in the last 30 days? (Cigarettes, Smokeless Tobacco, Cigars, and/or Pipes): Yes Tobacco use, Select all that apply: 5 or more cigarettes per day Are you interested in Tobacco Cessation Medications?: No, patient refused Counseled patient on smoking cessation including  recognizing danger situations, developing coping skills and basic information about quitting provided: Refused/Declined practical counseling Social History:  Social History   Substance and Sexual Activity  Alcohol Use Yes  . Alcohol/week: 0.0 standard drinks   Comment: Unknown      Social History   Substance and Sexual Activity  Drug Use Yes  . Frequency: 7.0 times per week  . Types: Marijuana, Cocaine   Comment: Unknown     Additional Social History:                           Allergies:   Allergies  Allergen Reactions  . Penicillins Rash    Did it involve swelling of the face/tongue/throat, SOB, or low BP? U Did it involve sudden or severe rash/hives, skin peeling, or any reaction on the inside of your mouth or nose? Y  Did you need to seek medical attention at a hospital or doctor's office? Y When did it last happen?childhood If all above answers are "NO", may proceed with cephalosporin use.    Lab Results:  Results for orders placed or performed during the hospital encounter of 09/09/20 (from the past 48 hour(s))  CBC with Differential     Status: Abnormal   Collection Time: 09/09/20  1:04 AM  Result Value Ref Range   WBC 6.4 4.0 - 10.5 K/uL   RBC 4.15 (L) 4.22 - 5.81 MIL/uL   Hemoglobin 12.4 (L) 13.0 - 17.0 g/dL   HCT 86.7 (L) 39 - 52 %   MCV 92.8 80.0 - 100.0 fL   MCH 29.9 26.0 - 34.0 pg   MCHC 32.2 30.0 - 36.0 g/dL   RDW 61.9 50.9 - 32.6 %   Platelets 262 150 - 400 K/uL   nRBC 0.0 0.0 - 0.2 %   Neutrophils Relative % 47 %   Neutro Abs 2.9 1.7 - 7.7 K/uL   Lymphocytes Relative 42 %   Lymphs Abs 2.7 0.7 - 4.0 K/uL   Monocytes Relative 8 %   Monocytes Absolute 0.5 0 - 1 K/uL   Eosinophils Relative 2 %   Eosinophils Absolute 0.2 0 - 0 K/uL   Basophils Relative 1 %   Basophils Absolute 0.0 0 - 0 K/uL   Immature Granulocytes 0 %   Abs Immature Granulocytes 0.01 0.00 - 0.07 K/uL    Comment: Performed at [facility], [address] [place], [place] [address]  Basic metabolic panel     Status: Abnormal   Collection Time: 09/09/20  1:04 AM  Result Value Ref Range   Sodium 139 135 - 145 mmol/L   Potassium 3.8 3.5 - 5.1 mmol/L   Chloride 104 98 - 111 mmol/L   CO2 26 22 - 32 mmol/L   Glucose, Bld 104 (H) 70 - 99 mg/dL    Comment: Glucose reference range applies only to samples taken after fasting for at least 8 hours.   BUN 16 6 - 20 mg/dL   Creatinine, Ser 8.09 0.61 - 1.24 mg/dL   Calcium 8.9 8.9 - 98.3 mg/dL   GFR calc non Af Amer >60 >60 mL/min   GFR calc Af Amer >60 >60 mL/min   Anion gap 9 5 - 15    Comment: Performed at [facility], [address], [place], [place] [address]  Ethanol     Status: None   Collection Time: 09/09/20  1:04 AM  Result Value Ref Range   Alcohol, Ethyl (B) <10 <10 mg/dL    Comment: (NOTE) Lowest detectable limit for serum alcohol is 10 mg/dL.  For medical purposes only. Performed at [facility], [address] [place], [place] [address]   Urine rapid drug screen (hosp performed)     Status: Abnormal   Collection Time: 09/09/20 12:10 PM  Result Value Ref Range   Opiates NONE DETECTED NONE DETECTED   Cocaine NONE DETECTED NONE DETECTED   Benzodiazepines NONE DETECTED NONE DETECTED   Amphetamines NONE DETECTED NONE DETECTED   Tetrahydrocannabinol POSITIVE (A) NONE DETECTED   Barbiturates NONE DETECTED NONE DETECTED    Comment: (NOTE) DRUG SCREEN FOR MEDICAL PURPOSES ONLY.  IF CONFIRMATION IS NEEDED FOR ANY PURPOSE, NOTIFY LAB WITHIN 5 DAYS.  LOWEST DETECTABLE LIMITS FOR URINE DRUG SCREEN Drug Class                     Cutoff (ng/mL) Amphetamine and metabolites    1000 Barbiturate and metabolites    200 Benzodiazepine                 200 Tricyclics and metabolites     300 Opiates and metabolites  300 Cocaine and metabolites        300 THC                            50 Performed at [facility],  [address] [place], [place] [address]   SARS Coronavirus 2 by RT PCR (hospital order, performed in [facility] hospital lab) Nasopharyngeal Nasopharyngeal Swab     Status: None   Collection Time: 09/09/20  2:53 PM   Specimen: Nasopharyngeal Swab  Result Value Ref Range   SARS Coronavirus 2 NEGATIVE NEGATIVE    Comment: (NOTE) SARS-CoV-2 target nucleic acids are NOT DETECTED.  The SARS-CoV-2 RNA is generally detectable in upper and lower respiratory specimens during the acute phase of infection. The lowest concentration of SARS-CoV-2 viral copies this assay can detect is 250 copies / mL. A negative result does not preclude SARS-CoV-2 infection and should not be used as the sole basis for treatment or other patient management decisions.  A negative result may occur with improper specimen collection / handling, submission of specimen other than nasopharyngeal swab, presence of viral mutation(s) within the areas targeted by this assay, and inadequate number of viral copies (<250 copies / mL). A negative result must be combined with clinical observations, patient history, and epidemiological information.  Fact Sheet for Patients:   BoilerBrush.com.cyhttps://www.fda.gov/media/136312/download  Fact Sheet for Healthcare Providers: https://pope.com/https://www.fda.gov/media/136313/download  This test is not yet approved or  cleared by the [place] FDA and has been authorized for detection and/or diagnosis of SARS-CoV-2 by FDA under an Emergency Use Authorization (EUA).  This EUA will remain in effect (meaning this test can be used) for the duration of the COVID-19 declaration under Section 564(b)(1) of the Act, 21 U.S.C. section 360bbb-3(b)(1), unless the authorization is terminated or revoked sooner.  Performed at [facility], [address] [place], [place] [address]     Blood Alcohol level:  Lab Results  Component Value Date   [employer] <10 09/09/2020   ETH <10 07/07/2020     Metabolic Disorder Labs:  Lab Results  Component Value Date   HGBA1C 4.6 (L) 04/02/2018   MPG 85.32 04/02/2018   MPG 91 04/11/2017   Lab Results  Component Value Date   PROLACTIN 28.0 (H) 11/06/2019   PROLACTIN 26.4 (H) 04/11/2017   Lab Results  Component Value Date   CHOL 202 (H) 04/02/2018   TRIG 102 04/02/2018   HDL 46 04/02/2018   CHOLHDL 4.4 04/02/2018   VLDL 20 04/02/2018   LDLCALC 136 (H) 04/02/2018   LDLCALC 167 (H) 04/11/2017    Current Medications: Current Facility-Administered Medications  Medication Dose Route Frequency Provider Last Rate Last Admin  . acetaminophen (TYLENOL) tablet 650 mg  650 mg Oral Q6H PRN Patrcia Dollyate, Tina L, FNP      . alum & mag hydroxide-simeth (MAALOX/MYLANTA) 200-200-20 MG/5ML suspension 30 mL  30 mL Oral Q4H PRN Patrcia Dollyate, Tina L, FNP      . benztropine (COGENTIN) tablet 1 mg  1 mg Oral BID Patrcia Dollyate, Tina L, FNP   1 mg at 09/10/20 0945  . divalproex (DEPAKOTE) DR tablet 500 mg  500 mg Oral BID Patrcia Dollyate, Tina L, FNP   500 mg at 09/10/20 0945  . hydrOXYzine (ATARAX/VISTARIL) tablet 25 mg  25 mg Oral TID PRN Patrcia Dollyate, Tina L, FNP   25 mg at 09/09/20 2047  . magnesium hydroxide (MILK OF MAGNESIA) suspension 30 mL  30 mL Oral  Daily PRN Patrcia Dolly, FNP      . OLANZapine (ZYPREXA) tablet 10 mg  10 mg Oral Daily Patrcia Dolly, FNP   10 mg at 09/10/20 0945  . OLANZapine (ZYPREXA) tablet 20 mg  20 mg Oral QHS Antonieta Pert, MD      . traZODone (DESYREL) tablet 50 mg  50 mg Oral QHS PRN Patrcia Dolly, FNP       PTA Medications: Medications Prior to Admission  Medication Sig Dispense Refill Last Dose  . divalproex (DEPAKOTE) 500 MG DR tablet Take 1 tablet (500 mg total) by mouth every 12 (twelve) hours. (Patient taking differently: Take 500 mg by mouth 2 (two) times daily. ) 60 tablet 0 More than a month at Unknown time  . haloperidol decanoate (HALDOL DECANOATE) 100 MG/ML injection Inject 100 mg into the muscle every 28 (twenty-eight) days.    More than a month  at Unknown time  . silver sulfADIAZINE (SILVADENE) 1 % cream Apply topically 2 (two) times daily. 50 g 0 More than a month at Unknown time  . [DISCONTINUED] benztropine (COGENTIN) 1 MG tablet Take 1 tablet (1 mg total) by mouth 2 (two) times daily for 21 days. 42 tablet 0   . [DISCONTINUED] OLANZapine (ZYPREXA) 10 MG tablet Take 1 tablet (10 mg total) by mouth daily. 30 tablet 0 More than a month at Unknown time  . [DISCONTINUED] OLANZapine (ZYPREXA) 20 MG tablet Take 1 tablet (20 mg total) by mouth at bedtime. 30 tablet 0 More than a month at Unknown time    Musculoskeletal: Strength & Muscle Tone: within normal limits Gait & Station: normal Patient leans: N/A  Psychiatric Specialty Exam: Physical Exam Vitals and nursing note reviewed.  Constitutional:      Appearance: Normal appearance.  HENT:     Head: Normocephalic and atraumatic.  Pulmonary:     Effort: Pulmonary effort is normal.  Neurological:     General: No focal deficit present.     Mental Status: He is alert and oriented to person, place, and time.     Review of Systems  Blood pressure 120/76, pulse 67, temperature (!) 97.4 F (36.3 C), temperature source Oral, resp. rate 18, height  (1.854 m), weight 77.6 kg, SpO2 98 %.Body mass index is 22.56 kg/m.  General Appearance: Casual  Eye Contact:  Fair  Speech:  Normal Rate  Volume:  Normal  Mood:  Irritable  Affect:  Congruent  Thought Process:  Coherent and Descriptions of Associations: Intact  Orientation:  Full (Time, Place, and Person)  Thought Content:  Delusions and Paranoid Ideation  Suicidal Thoughts:  No  Homicidal Thoughts:  No  Memory:  Immediate;   Fair Recent;   Fair Remote;   Fair  Judgement:  Intact  Insight:  Lacking  Psychomotor Activity:  Normal  Concentration:  Concentration: Good  Recall:  Good  Fund of Knowledge:  Fair  Language:  Good  Akathisia:  Negative  Handed:  Right  AIMS (if indicated):     Assets:  Desire for  Improvement Resilience  ADL's:  Intact  Cognition:  WNL  Sleep:  Number of Hours: 8    Treatment Plan Summary: Daily contact with patient to assess and evaluate symptoms and progress in treatment, Medication management and Plan : Patient is seen and examined.  Patient is a 44 year old male well-known to the service who presented with homicidal ideation.  He denies any current homicidal or suicidal ideation.  He stated his  main goal is to get back to [place], [place].  We have continued his medications as he had gotten most recently.  He stated in the emergency room he had been without his medications for the last 2 months.  It appears that he has gotten his medications in the emergency room throughout the last month or 2.  He will be discharged today and we will write him prescriptions for his Cogentin, Depakote and Zyprexa.  Review of his laboratories revealed essentially normal electrolytes.  His CBC was essentially normal with a mild anemia with a hemoglobin of 12.4 and hematocrit of 38.5.  Platelets were 262,000.  Depakote level was not obtained secondary to the patient stating he had not had it in 2 months.  His drug screen was positive for marijuana.  His blood alcohol was less than 10.  His vital signs are stable, he is afebrile.  He slept 8 hours last night.  Observation Level/Precautions:  15 minute checks  Laboratory:  Chemistry Profile  Psychotherapy:    Medications:    Consultations:    Discharge Concerns:    Estimated LOS:  Other:     Physician Treatment Plan for Primary Diagnosis: Schizoaffective disorder, bipolar type (HCC) Long Term Goal(s): Improvement in symptoms so as ready for discharge  Short Term Goals: Ability to identify changes in lifestyle to reduce recurrence of condition will improve, Ability to verbalize feelings will improve, Ability to demonstrate self-control will improve, Ability to identify and develop effective coping behaviors will improve, Ability  to maintain clinical measurements within normal limits will improve and Ability to identify triggers associated with substance abuse/mental health issues will improve  Physician Treatment Plan for Secondary Diagnosis: Principal Problem:   Schizoaffective disorder, bipolar type (HCC) Active Problems:   Cannabis use disorder, moderate, dependence (HCC)   Schizophrenia (HCC)   Amphetamine and psychostimulant-induced psychosis with hallucinations (HCC)  Long Term Goal(s): Improvement in symptoms so as ready for discharge  Short Term Goals: Ability to identify changes in lifestyle to reduce recurrence of condition will improve, Ability to verbalize feelings will improve, Ability to demonstrate self-control will improve, Ability to identify and develop effective coping behaviors will improve, Ability to maintain clinical measurements within normal limits will improve and Ability to identify triggers associated with substance abuse/mental health issues will improve  I certify that inpatient services furnished can reasonably be expected to improve the patient's condition.    Antonieta Pert, MD 9/17/202111:10 AM

## 2020-09-10 NOTE — Progress Notes (Signed)
Pt discharged to lobby. Pt was stable and appreciative at that time. All papers and prescriptions were given and valuables returned. Verbal understanding expressed. Denies SI/HI and A/VH. Pt given opportunity to express concerns and ask questions.  

## 2020-09-10 NOTE — Tx Team (Signed)
Initial Treatment Plan 09/10/2020 3:09 AM [name] SHF:[id number]    PATIENT STRESSORS: Marital or family conflict Medication change or noncompliance Substance abuse   PATIENT STRENGTHS: Ability for insight Active sense of humor   PATIENT IDENTIFIED PROBLEMS: HI towards his step-son  Medication non-compliance  Substance abuse                  DISCHARGE CRITERIA:  Improved stabilization in mood, thinking, and/or behavior Need for constant or close observation no longer present  PRELIMINARY DISCHARGE PLAN: Return to previous living arrangement  PATIENT/FAMILY INVOLVEMENT: This treatment plan has been presented to and reviewed with the patient, Peter Wilson, and/or family member.  The patient and family have been given the opportunity to ask questions and make suggestions.  Floyce Stakes, RN 09/10/2020, 3:09 AM

## 2020-09-10 NOTE — Progress Notes (Signed)
Patient is 44 year old male admitted voluntarily after he reports having had a fight with his stepson. He did not care to inform writer what the fight was about. He did comment that he wanted to kill him and reported that he was getting upset just talking about it.  He reports that the police were called his stepson was taken to jail and he was brought to the hospital.  He reports that he has not been taking his medications. Reports alcohol, thc, cocaine and ecstasy use. He was anxious and irritable during admission but redirectable.  Medical hx tardive dyskinesia and schizophrenia. Skin assessment completed, oriented to unit and meal given. Safety maintained on unit with 15 min checks.

## 2020-09-10 NOTE — BHH Counselor (Signed)
Adult Comprehensive Assessment  Patient ID: Peter Wilson, male   DOB: 1976-01-01, 44 y.o.   MRN: [id number]    CSW unable to complete full assessment due to this patient discharging within 24 hours.    Information Source: Information source:  (Chart review)  Current Stressors:  Patient states their primary concerns and needs for treatment are:: Patient was admitted due to experieincing homicidal ideations towards his step son  Living/Environment/Situation:  Living Arrangements: Other relatives   Summary/Recommendations:    Peter Wilson is an 44 y.o. male presenting to [facility] voluntarily for homicidal thoughts. Pt states that his step son has been "running his bitch ass mouth". Pt reports homicidal thoughts and stated "I will stab him in the neck". Pt did not report the reason him and his step son was arguing but reports they have been arguing for the past couple of days. Pt states that him and his step son have been living with his sister for the past two weeks and before moving in with his sister he was living in [place]. Pt stated that he has been without medications for the past two months. Pt stated that he is receiving ACTT services with [facility] but he has not been engaging with them. While here, Peter Wilson can benefit from crisis stabilization, medication management, therapeutic milieu, and referrals for services.  Sherri Mcarthy A Keedan Sample. 09/10/2020

## 2020-09-10 NOTE — Tx Team (Signed)
Interdisciplinary Treatment and Diagnostic Plan Update  09/10/2020 Time of Session: 10:00am Peter Wilson MRN: [id number]  Principal Diagnosis: Schizoaffective disorder, bipolar type [facility])  Secondary Diagnoses: Principal Problem:   Schizoaffective disorder, bipolar type (HCC) Active Problems:   Cannabis use disorder, moderate, dependence (HCC)   Schizophrenia (HCC)   Amphetamine and psychostimulant-induced psychosis with hallucinations (HCC)   Current Medications:  Current Facility-Administered Medications  Medication Dose Route Frequency Provider Last Rate Last Admin  . acetaminophen (TYLENOL) tablet 650 mg  650 mg Oral Q6H PRN Patrcia Dolly, FNP      . alum & mag hydroxide-simeth (MAALOX/MYLANTA) 200-200-20 MG/5ML suspension 30 mL  30 mL Oral Q4H PRN Patrcia Dolly, FNP      . benztropine (COGENTIN) tablet 1 mg  1 mg Oral BID Patrcia Dolly, FNP   1 mg at 09/10/20 0945  . divalproex (DEPAKOTE) DR tablet 500 mg  500 mg Oral BID Patrcia Dolly, FNP   500 mg at 09/10/20 0945  . hydrOXYzine (ATARAX/VISTARIL) tablet 25 mg  25 mg Oral TID PRN Patrcia Dolly, FNP   25 mg at 09/09/20 2047  . magnesium hydroxide (MILK OF MAGNESIA) suspension 30 mL  30 mL Oral Daily PRN Patrcia Dolly, FNP      . OLANZapine (ZYPREXA) tablet 10 mg  10 mg Oral Daily Patrcia Dolly, FNP   10 mg at 09/10/20 0945  . OLANZapine (ZYPREXA) tablet 20 mg  20 mg Oral QHS Antonieta Pert, MD      . traZODone (DESYREL) tablet 50 mg  50 mg Oral QHS PRN Patrcia Dolly, FNP       PTA Medications: Medications Prior to Admission  Medication Sig Dispense Refill Last Dose  . divalproex (DEPAKOTE) 500 MG DR tablet Take 1 tablet (500 mg total) by mouth every 12 (twelve) hours. (Patient taking differently: Take 500 mg by mouth 2 (two) times daily. ) 60 tablet 0 More than a month at Unknown time  . haloperidol decanoate (HALDOL DECANOATE) 100 MG/ML injection Inject 100 mg into the muscle every 28 (twenty-eight) days.    More than a month at  Unknown time  . silver sulfADIAZINE (SILVADENE) 1 % cream Apply topically 2 (two) times daily. 50 g 0 More than a month at Unknown time  . [DISCONTINUED] benztropine (COGENTIN) 1 MG tablet Take 1 tablet (1 mg total) by mouth 2 (two) times daily for 21 days. 42 tablet 0   . [DISCONTINUED] OLANZapine (ZYPREXA) 10 MG tablet Take 1 tablet (10 mg total) by mouth daily. 30 tablet 0 More than a month at Unknown time  . [DISCONTINUED] OLANZapine (ZYPREXA) 20 MG tablet Take 1 tablet (20 mg total) by mouth at bedtime. 30 tablet 0 More than a month at Unknown time    Patient Stressors: Marital or family conflict Medication change or noncompliance Substance abuse  Patient Strengths: Ability for insight Active sense of humor  Treatment Modalities: Medication Management, Group therapy, Case management,  1 to 1 session with clinician, Psychoeducation, Recreational therapy.   Physician Treatment Plan for Primary Diagnosis: Schizoaffective disorder, bipolar type (HCC) Long Term Goal(s):     Short Term Goals:    Medication Management: Evaluate patient's response, side effects, and tolerance of medication regimen.  Therapeutic Interventions: 1 to 1 sessions, Unit Group sessions and Medication administration.  Evaluation of Outcomes: Adequate for Discharge  Physician Treatment Plan for Secondary Diagnosis: Principal Problem:   Schizoaffective disorder, bipolar type (HCC) Active Problems:   Cannabis  use disorder, moderate, dependence (HCC)   Schizophrenia (HCC)   Amphetamine and psychostimulant-induced psychosis with hallucinations (HCC)  Long Term Goal(s):     Short Term Goals:       Medication Management: Evaluate patient's response, side effects, and tolerance of medication regimen.  Therapeutic Interventions: 1 to 1 sessions, Unit Group sessions and Medication administration.  Evaluation of Outcomes: Adequate for Discharge   RN Treatment Plan for Primary Diagnosis: Schizoaffective  disorder, bipolar type (HCC) Long Term Goal(s): Knowledge of disease and therapeutic regimen to maintain health will improve  Short Term Goals: Compliance with prescribed medications will improve  Medication Management: RN will administer medications as ordered by provider, will assess and evaluate patient's response and provide education to patient for prescribed medication. RN will report any adverse and/or side effects to prescribing provider.  Therapeutic Interventions: 1 on 1 counseling sessions, Psychoeducation, Medication administration, Evaluate responses to treatment, Monitor vital signs and CBGs as ordered, Perform/monitor CIWA, COWS, AIMS and Fall Risk screenings as ordered, Perform wound care treatments as ordered.  Evaluation of Outcomes: Adequate for Discharge   [name] Treatment Plan for Primary Diagnosis: Schizoaffective disorder, bipolar type (HCC) Long Term Goal(s): Safe transition to appropriate next level of care at discharge, Engage patient in therapeutic group addressing interpersonal concerns.  Short Term Goals: Engage patient in aftercare planning with referrals and resources, Increase social support and Increase skills for wellness and recovery  Therapeutic Interventions: Assess for all discharge needs, 1 to 1 time with Social worker, Explore available resources and support systems, Assess for adequacy in community support network, Educate family and significant other(s) on suicide prevention, Complete Psychosocial Assessment, Interpersonal group therapy.  Evaluation of Outcomes: Adequate for Discharge   Progress in Treatment: Attending groups: No. Participating in groups: No. Taking medication as prescribed: Yes. Toleration medication: Yes. Family/Significant other contact made: No, will contact:  consents not provided Patient understands diagnosis: Yes. Discussing patient identified problems/goals with staff: No. Medical problems stabilized or resolved:  Yes. Denies suicidal/homicidal ideation: Yes. Issues/concerns per patient self-inventory: No.   New problem(s) identified: No, Describe:  none  New Short Term/Long Term Goal(s): medication stabilization, elimination of SI thoughts, development of comprehensive mental wellness plan.   Patient Goals:   Patient did not attend  Discharge Plan or Barriers:   Reason for Continuation of Hospitalization: Homicidal ideation Medication stabilization  Estimated Length of Stay: Adequate for Discharge  Attendees: Patient: Did not attend  09/10/2020   Physician:  09/10/2020   Nursing:  09/10/2020   RN Care Manager: 09/10/2020   Social Worker: Ruthann Cancer, [name] 09/10/2020   Recreational Therapist:  09/10/2020   Other:  09/10/2020  Other:  09/10/2020  Other: 09/10/2020     Scribe for Treatment Team: Otelia Santee, [name] 09/10/2020 10:38 AM

## 2020-09-10 NOTE — [facility] Suicide Risk Assessment (Signed)
[facility] Discharge Suicide Risk Assessment   Principal Problem: Schizophrenia [facility]) Discharge Diagnoses: Principal Problem:   Schizophrenia (HCC) Active Problems:   Cannabis use disorder, moderate, dependence (HCC)   Amphetamine and psychostimulant-induced psychosis with hallucinations (HCC)   Total Time spent with patient: 30 minutes  Musculoskeletal: Strength & Muscle Tone: within normal limits Gait & Station: normal Patient leans: N/A  Psychiatric Specialty Exam: Review of Systems  HENT: Positive for dental problem.   All other systems reviewed and are negative.   Blood pressure 120/76, pulse 67, temperature (!) 97.4 F (36.3 C), temperature source Oral, resp. rate 18, height 6\' 1"  (1.854 m), weight 77.6 kg, SpO2 98 %.Body mass index is 22.56 kg/m.  General Appearance: Casual  Eye Contact::  Fair  Speech:  Normal Rate409  Volume:  Normal  Mood:  Irritable  Affect:  Congruent  Thought Process:  Coherent and Descriptions of Associations: Intact  Orientation:  Full (Time, Place, and Person)  Thought Content:  Paranoid Ideation  Suicidal Thoughts:  No  Homicidal Thoughts:  No  Memory:  Immediate;   Fair Recent;   Fair Remote;   Fair  Judgement:  Intact  Insight:  Lacking  Psychomotor Activity:  Increased  Concentration:  Fair  Recall:  [phone] of Knowledge:Fair  Language: Good  Akathisia:  Negative  Handed:  Right  AIMS (if indicated):     Assets:  Desire for Improvement Resilience  Sleep:  Number of Hours: 8  Cognition: WNL  ADL's:  Intact   Mental Status Per Nursing Assessment::   On Admission:  Plan to harm others, Thoughts of violence towards others  Demographic Factors:  Male, Divorced or widowed, Low socioeconomic status, Living alone and Unemployed  Loss Factors: NA  Historical Factors: Impulsivity  Risk Reduction Factors:   NA  Continued Clinical Symptoms:  Schizophrenia:   Paranoid or undifferentiated type  Cognitive Features That  Contribute To Risk:  Thought constriction (tunnel vision)    Suicide Risk:  Minimal: No identifiable suicidal ideation.  Patients presenting with no risk factors but with morbid ruminations; may be classified as minimal risk based on the severity of the depressive symptoms    Plan Of Care/Follow-up recommendations:  Activity:  ad lib  [phone], MD 09/10/2020, 10:01 AM

## 2020-09-10 NOTE — Progress Notes (Signed)
  [facility] Adult Case Management Discharge Plan :  Will you be returning to the same living situation after discharge:  No. Will be staying with cousin  At discharge, do you have transportation home?: No. Safe Transport to be arranged Do you have the ability to pay for your medications: Yes,  has insurance   Release of information consent forms completed and in the chart;  Patient's signature needed at discharge.  Patient to Follow up at:  Follow-up Information    [facility]. Go to.   Why: Walk-in to begin receving therapy and medication management.  Contact information: [address] [place], [place] [address]  [phone]              Next level of care provider has access to [facility] Link:no  Safety Planning and Suicide Prevention discussed: Yes,  with patient   Have you used any form of tobacco in the last 30 days? (Cigarettes, Smokeless Tobacco, Cigars, and/or Pipes): Yes  Has patient been referred to the Quitline?: Patient refused referral  Patient has been referred for addiction treatment: Pt. refused referral  Otelia Santee, [name] 09/10/2020, 10:42 AM

## 2020-09-10 NOTE — Discharge Summary (Signed)
Physician Discharge Summary Note  Patient:  Peter Wilson is an 44 y.o., male  MRN:  [id number]  DOB:  Nov 01, 1976  Patient phone:  [phone] (home)   Patient address:   [address] [place] [address],   Total Time spent with patient: 30 minutes  Date of Admission:  09/09/2020  Date of Discharge: 09/10/2020  Reason for Admission: Passive homicidal ideation.  Principal Problem: Schizoaffective disorder, bipolar type Peter Wilson)  Discharge Diagnoses: Principal Problem:   Schizoaffective disorder, bipolar type (HCC) Active Problems:   Schizophrenia (HCC)   Cannabis use disorder, moderate, dependence (HCC)   Amphetamine and psychostimulant-induced psychosis with hallucinations (HCC)  Past Psychiatric History: Schizophrenia, Polysubstance use disorder.                                             Patient has had multiple psychiatric hospitalizations as well as brief stays at the [facility] observational uint. The electronic medical record revealed 10 emergency room visits at least in the last 9 months.  Past Medical History:  Past Medical History:  Diagnosis Date  . Marijuana abuse   . Schizophrenia, paranoid (HCC)   . Tardive dyskinesia   . Tobacco abuse     Past Surgical History:  Procedure Laterality Date  . NO PAST SURGERIES     Family History: History reviewed. No pertinent family history.  Family Psychiatric  History: Denies  Social History:  Social History   Substance and Sexual Activity  Alcohol Use Yes  . Alcohol/week: 0.0 standard drinks   Comment: Unknown      Social History   Substance and Sexual Activity  Drug Use Yes  . Frequency: 7.0 times per week  . Types: Marijuana, Cocaine   Comment: Unknown     Social History   Socioeconomic History  . Marital status: Legally Separated    Spouse name: Not on file  . Number of children: Not on file  . Years of education: Not on file  . Highest education level: Not on file  Occupational History   . Occupation: Disability  Tobacco Use  . Smoking status: Current Every Day Smoker    Packs/day: 2.00    Types: Cigarettes  . Smokeless tobacco: Never Used  . Tobacco comment: Unknown   Substance and Sexual Activity  . Alcohol use: Yes    Alcohol/week: 0.0 standard drinks    Comment: Unknown   . Drug use: Yes    Frequency: 7.0 times per week    Types: Marijuana, Cocaine    Comment: Unknown   . Sexual activity: Not on file  Other Topics Concern  . Not on file  Social History Narrative   Pt discharged from [facility] on 10/10/2019, was assigned to a hotel in [place] by his ACTT provider[facility] in [place], [place].     Social Determinants of Health   Financial Resource Strain:   . Difficulty of Paying Living Expenses: Not on file  Food Insecurity:   . Worried About Programme researcher, broadcasting/film/video in the Last Year: Not on file  . Ran Out of Food in the Last Year: Not on file  Transportation Needs:   . Lack of Transportation (Medical): Not on file  . Lack of Transportation (Non-Medical): Not on file  Physical Activity:   . Days of Exercise per Week: Not on file  . Minutes of Exercise per Session: Not on  file  Stress:   . Feeling of Stress : Not on file  Social Connections:   . Frequency of Communication with Friends and Family: Not on file  . Frequency of Social Gatherings with Friends and Family: Not on file  . Attends Religious Services: Not on file  . Active Member of Clubs or Organizations: Not on file  . Attends BankerClub or Organization Meetings: Not on file  . Marital Status: Not on file   Hospital Course: (Per Md's admission H&P notes): Patient is seen and examined. Patient is a 44 year old male with a past psychiatric history significant for psychosis in the past that is felt to be secondary to amphetamine and psychostimulant causes, history of schizoaffective disorder, and as well history of marijuana use disorder who is very familiar to this medical service. The  patient presented to the [facility] emergency department on 09/09/2020 requesting a psychiatric evaluation. The patient stated at that time he was having homicidal ideation towards his stepson. He stated that he had been "running his bitch assmouth" and that he was having thoughts of harming him. He stated he and his stepson had been living with his sister for the past 2 weeks.Prior to that he had [place]. He stated at the time he was seen in [facility] emergency department he had been without his medications for the last 2 months. He told the folks in the emergency department that he had been [facility], but on the morning of my evaluation he stated he had "fired them". The patient has been in multiple emergency rooms over the last several months. Since his admission here on 6/23 he has been in the emergency department at several hospitals at least 10-12 times. This seems to be a recurrent problem. He becomes homicidal. Most of his hospitalizations have been in the observation unit in the emergency departments at these facilities. This morning he stated that he just 1 to get his medications right. He stated he had received clonazepam from some emergency room doctor, and I told him I would be unable to do that. He is understanding of that. He denied any auditory or visual hallucinations. He denied any suicidal or homicidal ideation. We discussed contacting his act team, and his primary objective this morning is to get a ride to [place] so he can "get out of this place".  This is one of the several psychiatric discharge summaries from this [facility] for Peter MoreQuincy, a 44 year old AA male with hx of mental illness & polysubstance use disorders. He is known in [facility] from his previous admissions/mood stabilization treatments. He is [facility] this time for complaint of homicidal  ideations towards his step-son because "he was running his bitch-ass mouth". He also complained that he has not had his mental health medications in the last 2 months. So, he was admitted for mental health evaluation. During his admission evaluation this morning, Qincy denies any current homicidal or suicidal ideations.  He stated his main goal is to get back to [place].  He denies any other issues or concerns. We have continued his mental health medications as he had gotten most recently.  He stated in the emergency room that he had been without his medications for the last 2 months.  It appears that he has gotten his medications in the emergency room throughout the last month or 2.  He will be discharged today  as he currently does not meet criteria for inpatient admission. However, we will write him new prescriptions for his Cogentin, Depakote and Zyprexa.  A reviewe of his laboratories results revealed essentially normal electrolytes.  His CBC was essentially normal with a mild anemia with a hemoglobin of 12.4 and hematocrit of 38.5.  Platelets were 262,000.  Depakote level was not obtained secondary to the patient stating he had not had it in 2 months. His drug screen was positive for marijuana.  His blood alcohol was less than 10.  His vital signs are stable, he is afebrile.  He slept 8 hours last night. He is currently mentally & medically stable to be discharged to continue mental health care on an outpatient as noted below.  Upon discharge, Axyl appears much Wilson in control of his mood & behavior. His symptoms were reported as significantly improved or completely resolved There are currently, no active SIHI, plans or intent, AVH, delusional thoughts or paranoia. He is going to pursue outpatient psychiatric care & medication management with the [facility] clinic in [place], [place].  He left [facility] with all personal belongings in no apparent distress. Transportation per [employer] transportation  services..  Physical Findings: AIMS:  , ,  ,  ,    CIWA:    COWS:     Musculoskeletal: Strength & Muscle Tone: within normal limits Gait & Station: normal Patient leans: N/A  Psychiatric Specialty Exam: Physical Exam Vitals and nursing note reviewed.  Constitutional:      Appearance: He is well-developed.  HENT:     Head: Normocephalic.     Nose: Nose normal.     Mouth/Throat:     Pharynx: Oropharynx is clear.  Eyes:     Pupils: Pupils are equal, round, and reactive to light.  Cardiovascular:     Rate and Rhythm: Normal rate.  Pulmonary:     Effort: Pulmonary effort is normal.  Genitourinary:    Comments: Deferred Musculoskeletal:        General: Normal range of motion.     Cervical back: Normal range of motion.  Skin:    General: Skin is warm and dry.  Neurological:     General: No focal deficit present.     Mental Status: He is alert and oriented to person, place, and time. Mental status is at baseline.     Review of Systems  Constitutional: Negative.  Negative for chills, diaphoresis and fever.  HENT: Negative for congestion, rhinorrhea, sneezing and sore throat.   Eyes: Negative for discharge.  Respiratory: Negative for cough, chest tightness, shortness of breath and wheezing.   Cardiovascular: Negative for chest pain and palpitations.  Gastrointestinal: Negative for diarrhea, nausea and vomiting.  Endocrine: Negative for cold intolerance.  Genitourinary: Negative for difficulty urinating.  Musculoskeletal: Negative for arthralgias and myalgias.  Skin: Negative.   Allergic/Immunologic: Negative for environmental allergies and food allergies.       Allergies: PCN  Neurological: Negative for dizziness, tremors, seizures, syncope, facial asymmetry, speech difficulty, weakness, light-headedness, numbness and headaches.  Psychiatric/Behavioral: Negative for agitation, behavioral problems, confusion, decreased concentration, dysphoric mood, hallucinations,  self-injury, sleep disturbance and suicidal ideas. The patient is not nervous/anxious and is not hyperactive.     Blood pressure 120/76, pulse 67, temperature (!) 97.4 F (36.3 C), temperature source Oral, resp. rate 18, height  (1.854 m), weight 77.6 kg, SpO2 98 %.Body mass index is 22.56 kg/m.  See MD's discharge SRA   Have you used any form of tobacco  in the last 30 days? (Cigarettes, Smokeless Tobacco, Cigars, and/or Pipes): Yes  Has this patient used any form of tobacco in the last 30 days? (Cigarettes, Smokeless Tobacco, Cigars, and/or Pipes) No  Blood Alcohol level:  Lab Results  Component Value Date   ETH <10 09/09/2020   ETH <10 07/07/2020    Metabolic Disorder Labs:  Lab Results  Component Value Date   HGBA1C 4.6 (L) 04/02/2018   MPG 85.32 04/02/2018   MPG 91 04/11/2017   Lab Results  Component Value Date   PROLACTIN 28.0 (H) 11/06/2019   PROLACTIN 26.4 (H) 04/11/2017   Lab Results  Component Value Date   CHOL 202 (H) 04/02/2018   TRIG 102 04/02/2018   HDL 46 04/02/2018   CHOLHDL 4.4 04/02/2018   VLDL 20 04/02/2018   LDLCALC 136 (H) 04/02/2018   LDLCALC 167 (H) 04/11/2017    See Psychiatric Specialty Exam and Suicide Risk Assessment completed by Attending Physician prior to discharge.  Discharge destination:  Home  Is patient on multiple antipsychotic therapies at discharge:  No   Has Patient had three or Wilson failed trials of antipsychotic monotherapy by history:  No  Recommended Plan for Multiple Antipsychotic Therapies: NA  Allergies as of 09/10/2020      Reactions   Penicillins Rash   Did it involve swelling of the face/tongue/throat, SOB, or low BP? U Did it involve sudden or severe rash/hives, skin peeling, or any reaction on the inside of your mouth or nose? Y  Did you need to seek medical attention at a hospital or doctor's office? Y When did it last happen?childhood If all above answers are "NO", may proceed with cephalosporin use.       Medication List    STOP taking these medications   haloperidol decanoate 100 MG/ML injection Commonly known as: HALDOL DECANOATE   silver sulfADIAZINE 1 % cream Commonly known as: SILVADENE     TAKE these medications     Indication  benztropine 1 MG tablet Commonly known as: COGENTIN Take 1 tablet (1 mg total) by mouth 2 (two) times daily for 21 days. For prevention of drug induced tremors What changed: additional instructions  Indication: Extrapyramidal Reaction caused by Medications   divalproex 500 MG DR tablet Commonly known as: DEPAKOTE Take 1 tablet (500 mg total) by mouth 2 (two) times daily. For mood stabilization What changed:   when to take this  additional instructions  Indication: Mood stabilization   hydrOXYzine 25 MG tablet Commonly known as: ATARAX/VISTARIL Take 1 tablet (25 mg total) by mouth 3 (three) times daily as needed for anxiety.  Indication: Feeling Anxious   OLANZapine 20 MG tablet Commonly known as: ZYPREXA Take 1 tablet (20 mg total) by mouth at bedtime. For Mood control What changed: additional instructions  Indication: Manic Phase of Manic-Depression, Mood control   OLANZapine 10 MG tablet Commonly known as: ZYPREXA Take 1 tablet (10 mg total) by mouth daily. For mood control What changed: additional instructions  Indication: Manic Phase of Manic-Depression, Mood control   traZODone 50 MG tablet Commonly known as: DESYREL Take 1 tablet (50 mg total) by mouth at bedtime as needed for sleep.  Indication: Trouble Sleeping       Follow-up Information    Freight forwarder. Go to.   Why: Walk-in to begin receving therapy and medication management.  Contact information: [address] [place], [place] [address]  [phone]             Follow-up  recommendations: Activity:  As tolerated Diet: As recommended by your primary care doctor. Keep all scheduled follow-up appointments as recommended.  Comments:  Prescriptions given at discharge.  Patient agreeable to plan.  Given opportunity to ask questions.  Appears to feel comfortable with discharge denies any current suicidal or homicidal thought. Patient is also instructed prior to discharge to: Take all medications as prescribed by his/her mental healthcare provider. Report any adverse effects and or reactions from the medicines to his/her outpatient provider promptly. Patient has been instructed & cautioned: To not engage in alcohol and or illegal drug use while on prescription medicines. In the event of worsening symptoms, patient is instructed to call the crisis hotline, 911 and or go to the nearest ED for appropriate evaluation and treatment of symptoms. To follow-up with his/her primary care provider for your other medical issues, concerns and or health care needs.    Signed: Armandina Stammer, NP, PMHNP, FNP-BC 09/10/2020, 1:57 PM

## 2020-09-10 NOTE — BHH Suicide Risk Assessment (Signed)
[facility] Admission Suicide Risk Assessment   Nursing information obtained from:  Patient Demographic factors:  Male, Access to firearms Current Mental Status:  Plan to harm others, Thoughts of violence towards others Loss Factors:  NA Historical Factors:  Impulsivity Risk Reduction Factors:  NA  Total Time spent with patient: 30 minutes Principal Problem: Schizophrenia (HCC) Diagnosis:  Principal Problem:   Schizophrenia (HCC) Active Problems:   Cannabis use disorder, moderate, dependence (HCC)   Amphetamine and psychostimulant-induced psychosis with hallucinations (HCC)  Subjective Data: Patient is seen and examined.  Patient is a 44 year old male with a past psychiatric history significant for psychosis in the past that is felt to be secondary to amphetamine and psychostimulant causes, history of schizoaffective disorder, and as well history of marijuana use disorder who is very familiar to this medical service.  The patient presented to the [facility] emergency department on 09/09/2020 requesting a psychiatric evaluation.  The patient stated at that time he was having homicidal ideation towards his stepson.  He stated that he had been "running his bitch ass mouth" and that he was having thoughts of harming him.  He stated he and his stepson had been living with his sister for the past 2 weeks.  Prior to that he had been living in [facility] [place] [facility].  He stated at the time he was seen in the [facility] emergency department he had been without his medications for the last 2 months.  He told the folks in the emergency department that he had been receiving [facility] from [facility], but on the morning of my evaluation he stated he had "fired them".  The patient has been in multiple emergency rooms over the last several months.  Since his admission here on 6/23 he has been in the emergency department at several hospitals at least 10-12 times.  This seems to be a  recurrent problem.  He becomes homicidal.  Most of his hospitalizations have been in the observation unit in the emergency departments at these facilities.  This morning he stated that he just 1 to get his medications right.  He stated he had received clonazepam from some emergency room doctor, and I told him I would be unable to do that.  He is understanding of that.  He denied any auditory or visual hallucinations.  He denied any suicidal or homicidal ideation.  We discussed contacting his act team, and his primary objective this morning is to get a ride to [place], [place] so he can "get out of this place".  Continued Clinical Symptoms:  Alcohol Use Disorder Identification Test Final Score (AUDIT): 10 The "Alcohol Use Disorders Identification Test", Guidelines for Use in Primary Care, Second Edition.  World Science writer [facility]). Score between 0-7:  no or low risk or alcohol related problems. Score between 8-15:  moderate risk of alcohol related problems. Score between 16-19:  high risk of alcohol related problems. Score 20 or above:  warrants further diagnostic evaluation for alcohol dependence and treatment.   CLINICAL FACTORS:   Alcohol/Substance Abuse/Dependencies Schizophrenia:   Paranoid or undifferentiated type   Musculoskeletal: Strength & Muscle Tone: within normal limits Gait & Station: normal Patient leans: N/A  Psychiatric Specialty Exam: Physical Exam Vitals and nursing note reviewed.  HENT:     Head: Normocephalic and atraumatic.  Pulmonary:     Effort: Pulmonary effort is normal.  Neurological:     General: No focal deficit present.     Mental Status: He is  alert and oriented to person, place, and time.     Review of Systems  Blood pressure 120/76, pulse 67, temperature (!) 97.4 F (36.3 C), temperature source Oral, resp. rate 18, height 6\' 1"  (1.854 m), weight 77.6 kg, SpO2 98 %.Body mass index is 22.56 kg/m.  General Appearance: Casual  Eye  Contact:  Good  Speech:  Normal Rate  Volume:  Increased  Mood:  Irritable  Affect:  Congruent  Thought Process:  Coherent and Descriptions of Associations: Intact  Orientation:  Full (Time, Place, and Person)  Thought Content:  Paranoid Ideation  Suicidal Thoughts:  No  Homicidal Thoughts:  No  Memory:  Immediate;   Fair Recent;   Fair Remote;   Fair  Judgement:  Intact  Insight:  Lacking  Psychomotor Activity:  Normal  Concentration:  Concentration: Fair and Attention Span: Fair  Recall:  of Knowledge:  Fair  Language:  Good  Akathisia:  Negative  Handed:  Right  AIMS (if indicated):     Assets:  Desire for Improvement Resilience  ADL's:  Intact  Cognition:  WNL  Sleep:  Number of Hours: 8      COGNITIVE FEATURES THAT CONTRIBUTE TO RISK:  None    SUICIDE RISK:   Minimal: No identifiable suicidal ideation.  Patients presenting with no risk factors but with morbid ruminations; may be classified as minimal risk based on the severity of the depressive symptoms  PLAN OF CARE: Patient is seen and examined.  Patient is a 44 year old male well-known to the service who presented with homicidal ideation.  He denies any current homicidal or suicidal ideation.  He stated his main goal is to get back to [place], [place].  We have continued his medications as he had gotten most recently.  He stated in the emergency room he had been without his medications for the last 2 months.  It appears that he has gotten his medications in the emergency room throughout the last month or 2.  He will be discharged today and we will write him prescriptions for his Cogentin, Depakote and Zyprexa.  Review of his laboratories revealed essentially normal electrolytes.  His CBC was essentially normal with a mild anemia with a hemoglobin of 12.4 and hematocrit of 38.5.  Platelets were 262,000.  Depakote level was not obtained secondary to the patient stating he had not had it in 2 months.   His drug screen was positive for marijuana.  His blood alcohol was less than 10.  His vital signs are stable, he is afebrile.  He slept 8 hours last night.  I certify that inpatient services furnished can reasonably be expected to improve the patient's condition.   [place], MD 09/10/2020, 9:53 AM

## 2020-09-10 NOTE — BHH Suicide Risk Assessment (Signed)
BHH INPATIENT:  Family/Significant Other Suicide Prevention Education  Refusal of Consents:  Suicide Prevention Education:  Patient Refusal for Family/Significant Other Suicide Prevention Education: The patient Peter Wilson has refused to provide written consent for family/significant other to be provided Family/Significant Other Suicide Prevention Education during admission and/or prior to discharge.  Physician notified.   SPE completed with patient, as patient refused to consent to family contact. SPI pamphlet provided to pt and pt was encouraged to share information with support network, ask questions, and talk about any concerns relating to SPE. Patient denies access to guns/firearms and verbalized understanding of information provided. Mobile Crisis information also provided to patient.  Ruthann Cancer MSW, [name] Clincal Social Worker  [facility]

## 2021-12-15 ENCOUNTER — Emergency Department (EMERGENCY_DEPARTMENT_HOSPITAL)
Admission: EM | Admit: 2021-12-15 | Discharge: 2021-12-15 | Disposition: A | Discharge status: Home / Self Care | Payer: Medicare Other | Source: Home / Self Care | Attending: Emergency Medicine | Admitting: Emergency Medicine

## 2021-12-15 ENCOUNTER — Encounter: Payer: Self-pay | Admitting: Emergency Medicine

## 2021-12-15 ENCOUNTER — Other Ambulatory Visit: Payer: Self-pay

## 2021-12-15 ENCOUNTER — Emergency Department: Payer: Medicare Other

## 2021-12-15 ENCOUNTER — Emergency Department
Admission: EM | Admit: 2021-12-15 | Discharge: 2021-12-15 | Discharge status: Against Medical Advice | Payer: Medicare Other | Attending: Emergency Medicine | Admitting: Emergency Medicine

## 2021-12-15 DIAGNOSIS — J189 Pneumonia, unspecified organism: Secondary | ICD-10-CM

## 2021-12-15 DIAGNOSIS — T8131XA Disruption of external operation (surgical) wound, not elsewhere classified, initial encounter: Secondary | ICD-10-CM | POA: Diagnosis not present

## 2021-12-15 DIAGNOSIS — R109 Unspecified abdominal pain: Secondary | ICD-10-CM | POA: Insufficient documentation

## 2021-12-15 DIAGNOSIS — R0789 Other chest pain: Secondary | ICD-10-CM | POA: Diagnosis not present

## 2021-12-15 DIAGNOSIS — Z20822 Contact with and (suspected) exposure to covid-19: Secondary | ICD-10-CM | POA: Diagnosis not present

## 2021-12-15 DIAGNOSIS — J45909 Unspecified asthma, uncomplicated: Secondary | ICD-10-CM | POA: Insufficient documentation

## 2021-12-15 DIAGNOSIS — F17210 Nicotine dependence, cigarettes, uncomplicated: Secondary | ICD-10-CM | POA: Insufficient documentation

## 2021-12-15 DIAGNOSIS — Y900 Blood alcohol level of less than 20 mg/100 ml: Secondary | ICD-10-CM | POA: Insufficient documentation

## 2021-12-15 DIAGNOSIS — R456 Violent behavior: Secondary | ICD-10-CM | POA: Insufficient documentation

## 2021-12-15 DIAGNOSIS — R440 Auditory hallucinations: Secondary | ICD-10-CM | POA: Insufficient documentation

## 2021-12-15 DIAGNOSIS — F4325 Adjustment disorder with mixed disturbance of emotions and conduct: Secondary | ICD-10-CM | POA: Insufficient documentation

## 2021-12-15 DIAGNOSIS — R4689 Other symptoms and signs involving appearance and behavior: Secondary | ICD-10-CM

## 2021-12-15 LAB — URINE DRUG SCREEN, QUALITATIVE (ARMC ONLY)
Amphetamines, Ur Screen: NOT DETECTED
Barbiturates, Ur Screen: NOT DETECTED
Benzodiazepine, Ur Scrn: NOT DETECTED
Cannabinoid 50 Ng, Ur ~~LOC~~: POSITIVE — AB
Cocaine Metabolite,Ur ~~LOC~~: NOT DETECTED
MDMA (Ecstasy)Ur Screen: NOT DETECTED
Methadone Scn, Ur: NOT DETECTED
Opiate, Ur Screen: NOT DETECTED
Phencyclidine (PCP) Ur S: NOT DETECTED
Tricyclic, Ur Screen: NOT DETECTED

## 2021-12-15 LAB — COMPREHENSIVE METABOLIC PANEL
ALT: 22 U/L (ref 0–44)
ALT: 22 U/L (ref 0–44)
AST: 46 U/L — ABNORMAL HIGH (ref 15–41)
AST: 40 U/L (ref 15–41)
Albumin: 3.9 g/dL (ref 3.5–5.0)
Albumin: 3.6 g/dL (ref 3.5–5.0)
Alkaline Phosphatase: 71 U/L (ref 38–126)
Alkaline Phosphatase: 64 U/L (ref 38–126)
Anion gap: 6 (ref 5–15)
Anion gap: 6 (ref 5–15)
BUN: 11 mg/dL (ref 6–20)
BUN: 11 mg/dL (ref 6–20)
CO2: 27 mmol/L (ref 22–32)
CO2: 26 mmol/L (ref 22–32)
Calcium: 9 mg/dL (ref 8.9–10.3)
Calcium: 9 mg/dL (ref 8.9–10.3)
Chloride: 102 mmol/L (ref 98–111)
Chloride: 105 mmol/L (ref 98–111)
Creatinine, Ser: 0.91 mg/dL (ref 0.61–1.24)
Creatinine, Ser: 0.76 mg/dL (ref 0.61–1.24)
GFR, Estimated: >60 mL/min (ref >60)
GFR, Estimated: >60 mL/min (ref >60)
Glucose, Bld: 109 mg/dL — ABNORMAL HIGH (ref 70–99)
Glucose, Bld: 93 mg/dL (ref 70–99)
Potassium: 3.3 mmol/L — ABNORMAL LOW (ref 3.5–5.1)
Potassium: 3.7 mmol/L (ref 3.5–5.1)
Sodium: 135 mmol/L (ref 135–145)
Sodium: 137 mmol/L (ref 135–145)
Total Bilirubin: 0.5 mg/dL (ref 0.3–1.2)
Total Bilirubin: 0.4 mg/dL (ref 0.3–1.2)
Total Protein: 8.5 g/dL — ABNORMAL HIGH (ref 6.5–8.1)
Total Protein: 7.7 g/dL (ref 6.5–8.1)

## 2021-12-15 LAB — URINALYSIS, ROUTINE W REFLEX MICROSCOPIC
Bilirubin Urine: NEGATIVE
Glucose, UA: NEGATIVE mg/dL
Hgb urine dipstick: NEGATIVE
Ketones, ur: NEGATIVE mg/dL
Leukocytes,Ua: NEGATIVE
Nitrite: NEGATIVE
Protein, ur: NEGATIVE mg/dL
Specific Gravity, Urine: <1.005 — ABNORMAL LOW (ref 1.005–1.030)
pH: 6 (ref 5.0–8.0)

## 2021-12-15 LAB — RESP PANEL BY RT-PCR (FLU A&B, COVID) ARPGX2
Influenza A by PCR: NEGATIVE
Influenza B by PCR: NEGATIVE
SARS Coronavirus 2 by RT PCR: NEGATIVE

## 2021-12-15 LAB — PROCALCITONIN: Procalcitonin: <0.1 ng/mL

## 2021-12-15 LAB — CBC WITH DIFFERENTIAL/PLATELET
Abs Immature Granulocytes: 0.04 10*3/uL (ref 0.00–0.07)
Abs Immature Granulocytes: 0.02 10*3/uL (ref 0.00–0.07)
Basophils Absolute: 0.1 10*3/uL (ref 0.0–0.1)
Basophils Absolute: 0.1 10*3/uL (ref 0.0–0.1)
Basophils Relative: 1 %
Basophils Relative: 1 %
Eosinophils Absolute: 0 10*3/uL (ref 0.0–0.5)
Eosinophils Absolute: 0 10*3/uL (ref 0.0–0.5)
Eosinophils Relative: 0 %
Eosinophils Relative: 0 %
HCT: 30.3 % — ABNORMAL LOW (ref 39.0–52.0)
HCT: 28.3 % — ABNORMAL LOW (ref 39.0–52.0)
Hemoglobin: 9.9 g/dL — ABNORMAL LOW (ref 13.0–17.0)
Hemoglobin: 9.4 g/dL — ABNORMAL LOW (ref 13.0–17.0)
Immature Granulocytes: 0 %
Immature Granulocytes: 0 %
Lymphocytes Relative: 13 %
Lymphocytes Relative: 14 %
Lymphs Abs: 1.4 10*3/uL (ref 0.7–4.0)
Lymphs Abs: 1.3 10*3/uL (ref 0.7–4.0)
MCH: 31.7 pg (ref 26.0–34.0)
MCH: 31.8 pg (ref 26.0–34.0)
MCHC: 32.7 g/dL (ref 30.0–36.0)
MCHC: 33.2 g/dL (ref 30.0–36.0)
MCV: 97.1 fL (ref 80.0–100.0)
MCV: 95.6 fL (ref 80.0–100.0)
Monocytes Absolute: 0.6 10*3/uL (ref 0.1–1.0)
Monocytes Absolute: 0.6 10*3/uL (ref 0.1–1.0)
Monocytes Relative: 6 %
Monocytes Relative: 6 %
Neutro Abs: 8.7 10*3/uL — ABNORMAL HIGH (ref 1.7–7.7)
Neutro Abs: 7.7 10*3/uL (ref 1.7–7.7)
Neutrophils Relative %: 80 %
Neutrophils Relative %: 79 %
Platelets: 770 10*3/uL — ABNORMAL HIGH (ref 150–400)
Platelets: 703 10*3/uL — ABNORMAL HIGH (ref 150–400)
RBC: 3.12 MIL/uL — ABNORMAL LOW (ref 4.22–5.81)
RBC: 2.96 MIL/uL — ABNORMAL LOW (ref 4.22–5.81)
RDW: 14.8 % (ref 11.5–15.5)
RDW: 14.6 % (ref 11.5–15.5)
WBC: 10.8 10*3/uL — ABNORMAL HIGH (ref 4.0–10.5)
WBC: 9.7 10*3/uL (ref 4.0–10.5)
nRBC: 0 % (ref 0.0–0.2)
nRBC: 0 % (ref 0.0–0.2)

## 2021-12-15 LAB — VALPROIC ACID LEVEL: Valproic Acid Lvl: <10 ug/mL — ABNORMAL LOW (ref 50.0–100.0)

## 2021-12-15 LAB — TROPONIN I (HIGH SENSITIVITY)
Troponin I (High Sensitivity): 8 ng/L (ref <18)
Troponin I (High Sensitivity): 23 ng/L — ABNORMAL HIGH (ref <18)
Troponin I (High Sensitivity): 10 ng/L (ref <18)

## 2021-12-15 LAB — ACETAMINOPHEN LEVEL: Acetaminophen (Tylenol), Serum: <10 ug/mL — ABNORMAL LOW (ref 10–30)

## 2021-12-15 LAB — SALICYLATE LEVEL: Salicylate Lvl: <7 mg/dL — ABNORMAL LOW (ref 7.0–30.0)

## 2021-12-15 LAB — ETHANOL
Alcohol, Ethyl (B): <10 mg/dL (ref <10)
Alcohol, Ethyl (B): <10 mg/dL (ref <10)

## 2021-12-15 LAB — LIPASE, BLOOD
Lipase: 32 U/L (ref 11–51)
Lipase: 33 U/L (ref 11–51)

## 2021-12-15 MED ORDER — DOXYCYCLINE HYCLATE 100 MG PO TABS: 100.0000 mg | ORAL_TABLET | Freq: Two times a day (BID) | ORAL | 0 refills | Status: AC

## 2021-12-15 MED ORDER — HALOPERIDOL 5 MG PO TABS: 5.0000 mg | ORAL_TABLET | Freq: Two times a day (BID) | ORAL | Status: DC

## 2021-12-15 MED ORDER — OXYCODONE HCL 5 MG PO TABS: 5.0000 mg | ORAL_TABLET | Freq: Four times a day (QID) | ORAL | Status: DC | PRN

## 2021-12-15 MED ORDER — DOXYCYCLINE HYCLATE 100 MG PO TABS
100.0000 mg | ORAL_TABLET | Freq: Two times a day (BID) | ORAL | Status: DC
  Administered 2021-12-15: 05:00:00 100 mg via ORAL
  Filled 2021-12-15: qty 1

## 2021-12-15 MED ORDER — ONDANSETRON 4 MG PO TBDP: 4.0000 mg | ORAL_TABLET | Freq: Three times a day (TID) | ORAL | Status: DC | PRN

## 2021-12-15 MED ORDER — IOHEXOL 300 MG/ML  SOLN
100.0000 mL | Freq: Once | INTRAMUSCULAR | Status: AC | PRN
  Administered 2021-12-15: 05:00:00 100 mL via INTRAVENOUS

## 2021-12-15 MED ORDER — HALOPERIDOL LACTATE 5 MG/ML IJ SOLN
5.0000 mg | Freq: Once | INTRAMUSCULAR | Status: AC
  Administered 2021-12-15: 01:00:00 5 mg via INTRAVENOUS
  Filled 2021-12-15: qty 1

## 2021-12-15 MED ORDER — DIVALPROEX SODIUM 500 MG PO DR TAB: 750.0000 mg | DELAYED_RELEASE_TABLET | Freq: Two times a day (BID) | ORAL | Status: DC

## 2021-12-15 MED ORDER — DOCUSATE SODIUM 100 MG PO CAPS: 100.0000 mg | ORAL_CAPSULE | Freq: Two times a day (BID) | ORAL | Status: DC | PRN

## 2021-12-15 MED ORDER — BENZTROPINE MESYLATE 1 MG PO TABS: 1.0000 mg | ORAL_TABLET | Freq: Two times a day (BID) | ORAL | Status: DC

## 2021-12-15 MED ORDER — CLONAZEPAM 0.5 MG PO TBDP: 0.5000 mg | ORAL_TABLET | Freq: Two times a day (BID) | ORAL | Status: DC

## 2021-12-15 MED ORDER — LORAZEPAM 2 MG/ML IJ SOLN: 1.0000 mg | Freq: Once | INTRAMUSCULAR | Status: DC

## 2021-12-15 NOTE — ED Notes (Signed)
Pt asking to speak with Calvin, TTS before he is discharged.  RN will attempt to call TTS.

## 2021-12-15 NOTE — ED Provider Notes (Signed)
°  Patient received in signout from Dr. Elesa Massed.  Patient seen by psychiatry and cleared from their perspective.  Per original plan of care, we will discharge with antibiotics to treat pneumonia without evidence of sepsis or respiratory failure.  Return precautions for the ED discussed.  Provided 10-day course of doxycycline.   Delton Prairie, MD 12/15/21 [phone]
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
## 2021-12-15 NOTE — ED Notes (Signed)
Patient resting quietly in room. No noted distress or abnormal behaviors noted. Will continue 15 minute checks. 

## 2021-12-15 NOTE — ED Notes (Signed)
This RN at bedside to apply dressings to pt's wounds per request.
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
## 2021-12-15 NOTE — ED Notes (Addendum)
Pt dressed out in paper scrubs and belongings are in 5 bags  Black socks White pants 2 Black shirts  White hoody Black hoody Grey underwear Vape White & black shoes 2 Blue jeans Black belt Phone Headphones White shirt Two bulk packs of black socks Bulk pack of underwear Black jeans  Black hat Speaker Black and white bag

## 2021-12-15 NOTE — Discharge Instructions (Signed)
Take doxycycline antibiotic twice daily for the next 10 days.  Please finish all 20 pills.

## 2021-12-15 NOTE — ED Notes (Signed)
RN offered pt scheduled mediations, but pt is fixated on leaving the ER.  EDP made aware.
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
## 2021-12-15 NOTE — ED Notes (Signed)
Pts breakfast tray placed at bedside. Pt continues to sleep at this time.

## 2021-12-15 NOTE — ED Notes (Signed)
Medications retrieved from pharmacy and returned to pt at discharge.

## 2021-12-15 NOTE — ED Notes (Signed)
Dr. Elesa Massed in room, pt asking to leave, pt's IV removed, security in room while pt was getting dressed, pt ambulatory. Dr. Elesa Massed discussed with pt about leaving.

## 2021-12-15 NOTE — ED Notes (Signed)
Pt discharged.  All belongings returned to patient.

## 2022-06-12 DIAGNOSIS — E860 Dehydration: Secondary | ICD-10-CM

## 2022-06-12 NOTE — ED Provider Notes (Signed)
EMERGENCY DEPARTMENT HISTORY AND PHYSICAL EXAM      Date: 06/12/2022  Patient Name: Richard Bruce    History of Presenting Illness     Chief Complaint   Patient presents with    Mental Health Problem       46 year old male with schizophrenia presenting to the ED via EMS for complaints of feeling unwell.  Patient was at hospital in [place] this morning given Haldol and Ativan for headache.  Has no other complaints.                  PCP: None None    No current facility-administered medications for this encounter.     No current outpatient medications on file.       Past History     Past Medical History:  No past medical history on file.    Past Surgical History:  No past surgical history on file.    Family History:  No family history on file.    Social History:       Allergies:  No Known Allergies      Review of Systems       Review of Systems   Constitutional:  Negative for activity change, appetite change, fatigue and fever.   HENT:  Negative for congestion and sore throat.    Respiratory:  Negative for chest tightness and shortness of breath.    Cardiovascular:  Negative for chest pain.   Gastrointestinal:  Negative for abdominal pain, diarrhea, nausea and vomiting.   Genitourinary:  Negative for difficulty urinating, dysuria, flank pain, hematuria and urgency.   Musculoskeletal:  Negative for arthralgias.   Skin:  Negative for rash.   Neurological:  Negative for dizziness, weakness, light-headedness, numbness and headaches.   Psychiatric/Behavioral:  Negative for agitation.        Physical Exam   BP 115/73   Pulse (!) 102   Temp 98.1 F (36.7 C) (Oral)   Resp 18   SpO2 98%       Physical Exam  Constitutional:       General: He is not in acute distress.     Appearance: He is not toxic-appearing.   HENT:      Head: Normocephalic and atraumatic.      Mouth/Throat:      Mouth: Mucous membranes are moist.   Eyes:      Extraocular Movements: Extraocular movements intact.      Pupils: Pupils are equal, round,  and reactive to light.   Cardiovascular:      Rate and Rhythm: Normal rate and regular rhythm.      Pulses: Normal pulses.      Heart sounds: Normal heart sounds.   Pulmonary:      Effort: Pulmonary effort is normal.      Breath sounds: Normal breath sounds.   Abdominal:      General: Abdomen is flat.      Palpations: Abdomen is soft.      Tenderness: There is no abdominal tenderness.   Musculoskeletal:         General: No swelling or deformity. Normal range of motion.      Cervical back: Normal range of motion and neck supple.   Skin:     General: Skin is warm and dry.      Capillary Refill: Capillary refill takes less than 2 seconds.   Neurological:      General: No focal deficit present.      Mental Status: He is  alert and oriented to person, place, and time.   Psychiatric:         Mood and Affect: Mood normal.         Diagnostic Study Results     Labs -  No results found for this or any previous visit (from the past 12 hour(s)).    Radiologic Studies -   No orders to display           Medical Decision Making   I am the first provider for this patient.    I reviewed the vital signs, available nursing notes, past medical history, past surgical history, family history and social history.      Vital Signs-Reviewed the patient's vital signs.    EKG: Normal sinus rhythm.  No STEMI.  As interpreted by me.    ED Course: Progress Notes, Reevaluation, and Consults:    Provider Notes (Medical Decision Making):       MDM  Number of Diagnoses or Management Options  Dehydration  Diagnosis management comments: Patient is well-appearing.  Vital signs stable.  He has a benign physical exam.  He is alert and oriented.  He is ambulatory.  Screening labs obtained showing no acute findings.  Mild AKI with creatinine of 1.5.  Patient given IV fluids and is feeling improved.  Likely was dehydrated.  patient reassured instructed to drink water, and discharged                 Procedures          Diagnosis     Clinical Impression:   1.  Dehydration        Disposition: discharged      Disclaimer: Sections of this note are dictated using utilizing voice recognition software.  Minor typographical errors may be present. If questions arise, please do not hesitate to contact me or call our department.         Sherre Scarlet, DO  06/21/22 [phone]

## 2022-06-12 NOTE — ED Triage Notes (Signed)
Pt arrives via EMS for feeling unwell. Pt has hx o schizophrenia. Pt was seen at [facility] hospital and given haldol and ativan this morning. Since receiving tx pt states he feels unwell. A&Ox4.

## 2022-06-13 ENCOUNTER — Inpatient Hospital Stay
Admit: 2022-06-13 | Discharge: 2022-06-13 | Disposition: A | Discharge status: Home / Self Care | Payer: PRIVATE HEALTH INSURANCE | Attending: Emergency Medicine

## 2022-06-13 LAB — BASIC METABOLIC PANEL
Anion Gap: 7 mmol/L (ref 3.0–18)
BUN/Creatinine Ratio: 13 (ref 12–20)
BUN: 20 MG/DL — ABNORMAL HIGH (ref 7.0–18)
CO2: 30 mmol/L (ref 21–32)
Calcium: 9.8 MG/DL (ref 8.5–10.1)
Chloride: 99 mmol/L — ABNORMAL LOW (ref 100–111)
Creatinine: 1.52 MG/DL — ABNORMAL HIGH (ref 0.6–1.3)
Est, Glom Filt Rate: 58 mL/min/{1.73_m2} — ABNORMAL LOW (ref >60)
Glucose: 211 mg/dL — ABNORMAL HIGH (ref 74–99)
Potassium: 4.4 mmol/L (ref 3.5–5.5)
Sodium: 136 mmol/L (ref 136–145)

## 2022-06-13 LAB — EKG 12-LEAD
Atrial Rate: 91 {beats}/min
Diagnosis: NORMAL
P Axis: 81 degrees
P-R Interval: 140 ms
Q-T Interval: 346 ms
QRS Duration: 92 ms
QTc Calculation (Bazett): 425 ms
R Axis: 36 degrees
T Axis: 59 degrees
Ventricular Rate: 91 {beats}/min

## 2022-06-13 LAB — CBC WITH AUTO DIFFERENTIAL
Absolute Immature Granulocyte: 0 10*3/uL (ref 0.00–0.04)
Basophils %: 1 % (ref 0–2)
Basophils Absolute: 0.1 10*3/uL (ref 0.0–0.1)
Eosinophils %: 1 % (ref 0–5)
Eosinophils Absolute: 0.1 10*3/uL (ref 0.0–0.4)
Hematocrit: 41.8 % (ref 36.0–48.0)
Hemoglobin: 13.2 g/dL (ref 13.0–16.0)
Immature Granulocytes: 0 % (ref 0.0–0.5)
Lymphocytes %: 27 % (ref 21–52)
Lymphocytes Absolute: 2.5 10*3/uL (ref 0.9–3.6)
MCH: 29 PG (ref 24.0–34.0)
MCHC: 31.6 g/dL (ref 31.0–37.0)
MCV: 91.9 FL (ref 78.0–100.0)
MPV: 9 FL — ABNORMAL LOW (ref 9.2–11.8)
Monocytes %: 8 % (ref 3–10)
Monocytes Absolute: 0.8 10*3/uL (ref 0.05–1.2)
Neutrophils %: 64 % (ref 40–73)
Neutrophils Absolute: 6.1 10*3/uL (ref 1.8–8.0)
Nucleated RBCs: 0 PER 100 WBC
Platelets: 288 10*3/uL (ref 135–420)
RBC: 4.55 M/uL (ref 4.35–5.65)
RDW: 13.2 % (ref 11.6–14.5)
WBC: 9.5 10*3/uL (ref 4.6–13.2)
nRBC: 0 10*3/uL (ref 0.00–0.01)

## 2022-06-13 MED ORDER — SODIUM CHLORIDE 0.9 % IV BOLUS
0.9 % | Freq: Once | INTRAVENOUS | Status: DC
Start: 2022-06-13 — End: 2022-06-13

## 2022-06-13 MED FILL — SODIUM CHLORIDE 0.9 % IV SOLN: 0.9 % | INTRAVENOUS | Qty: 1000

## 2022-06-13 NOTE — Discharge Instructions (Addendum)
Make sure to drink plenty of fluids to stay hydrated.

## 2022-06-22 ENCOUNTER — Emergency Department: Payer: MEDICARE

## 2022-06-22 ENCOUNTER — Inpatient Hospital Stay
Admit: 2022-06-22 | Discharge: 2022-06-22 | Disposition: A | Discharge status: Home / Self Care | Payer: MEDICARE | Attending: Emergency Medicine

## 2022-06-22 DIAGNOSIS — S8992XA Unspecified injury of left lower leg, initial encounter: Secondary | ICD-10-CM

## 2022-06-22 NOTE — ED Provider Notes (Signed)
EMERGENCY DEPARTMENT HISTORY AND PHYSICAL EXAM      Date: 06/22/2022  Patient Name: Richard Bruce    History of Presenting Illness     Chief Complaint   Patient presents with    Knee Injury       History (Context): Richard Bruce is a 46 y.o. male with no significant past medical history presents ambulatory the ED today with chief complaint of knee pain after trauma yesterday.  Patient reports he was walking his dog and fell, landing on left knee. Denies dislocation. Denies numbness, tingling, rating pain, weakness. Patient amatory to event.  Denies any head and LOC.  Denies taking blood thinners.      PCP: None None    No current facility-administered medications for this encounter.     No current outpatient medications on file.       Past History     Past Medical History:   No past medical history on file.    Past Surgical History:  No past surgical history on file.    Family History:  No family history on file.    Social History:        Allergies:  No Known Allergies    PMH, PSH, family history, social history, allergies reviewed with the patient with significant items noted above.  Review of Systems   Review of Systems   Constitutional:  Negative for appetite change and fatigue.   HENT:  Negative for rhinorrhea.    Respiratory:  Negative for shortness of breath.    Cardiovascular:  Negative for chest pain.   Gastrointestinal:  Negative for abdominal pain, diarrhea and vomiting.   Genitourinary:  Negative for dysuria and penile discharge.   Musculoskeletal:  Positive for arthralgias (left knee pain). Negative for myalgias.   Skin:  Negative for color change and wound.   Neurological:  Negative for dizziness and light-headedness.   Psychiatric/Behavioral:  Negative for suicidal ideas.    All other systems reviewed and are negative.    Physical Exam     Vitals:    06/22/22 1028   BP: 115/65   Pulse: 80   Resp: 16   Temp: 97.5 F (36.4 C)   TempSrc: Oral   SpO2: 99%       Physical Exam  Vitals and nursing note  reviewed.   Constitutional:       Appearance: Normal appearance.      Comments: Pt in NAD   HENT:      Head: Normocephalic and atraumatic.   Eyes:      Conjunctiva/sclera: Conjunctivae normal.   Cardiovascular:      Rate and Rhythm: Normal rate and regular rhythm.   Pulmonary:      Effort: Pulmonary effort is normal.      Breath sounds: Normal breath sounds.   Abdominal:      General: Bowel sounds are normal.      Palpations: Abdomen is soft.   Musculoskeletal:      Cervical back: Full passive range of motion without pain.      Left knee: Bony tenderness present. Normal range of motion (full AROM intact). Tenderness present.      Comments: DP pulses strong and equal b/l  Sensation equal and intact to lower extremities b/l  Strength 5/5 to lower extremities b/l   Skin:     General: Skin is warm and moist.   Neurological:      Mental Status: He is alert.   Psychiatric:  Thought Content: Thought content normal.       Diagnostic Study Results     Labs -   No results found for this or any previous visit (from the past 12 hour(s)). Labs Reviewed - No data to display    Radiologic Studies -   No orders to display         The laboratory results, imaging results, and other diagnostic exams were reviewed in the EMR.    Medical Decision Making   I am the first provider for this patient.    I reviewed the vital signs, available nursing notes, past medical history, past surgical history, family history and social history.    Vital Signs-Reviewed the patient's vital signs.       Records Reviewed: Personally, on initial evaluation    MDM:   DDX includes but is not limited to: Knee sprain vs fracture vs contusion    Will obtain lab work and imaging for further evaluation of patients complaint. Will continue to monitor and evaluate patient while in the ED.      Orders as below:  No orders of the defined types were placed in this encounter.       Richard Bruce presents with complaint of left knee pain and swelling after trauma  yesterday.  On exam mild tenderness.  Neurovascularly intact.  Patient eloped prior to receiving x-ray.    ED Course:          Procedures:  Procedures       Documentation/Prior Results Review:  Old medical records.  Nursing notes.      Discussion of Mangement with other Physicians, QHP or Appropriate         Diagnosis and Disposition     CLINICAL IMPRESSION:  No diagnosis found.     Medication List      You have not been prescribed any medications.         Disposition: Eloped        Animal nutritionist     Please note that this dictation was completed with Dragon, the [employer] account planner.  Quite often unanticipated grammatical, syntax, homophones, and other interpretive errors are inadvertently transcribed by the computer software.  Please disregard these errors.  Please excuse any errors that have escaped final proofreading.      Collier Flowers PA-C       Joaquim Lai, [place]  06/22/22 1128

## 2022-06-22 NOTE — ED Triage Notes (Signed)
Client had fall while running from dogs last pm, falling on l. Knee experiencing pain with ambulation. Ambulatory into er strong gait. Pain 8/10.

## 2022-06-22 NOTE — ED Notes (Signed)
Pt states he's going to the [facility] hospital.     Ladora Daniel Alvin, RN  06/22/22 1041

## 2022-06-28 DIAGNOSIS — R519 Headache, unspecified: Secondary | ICD-10-CM

## 2022-06-28 NOTE — ED Notes (Signed)
Pt presents with a headache since noon today     Vic Ripper, RN  06/28/22 2259

## 2022-06-28 NOTE — ED Provider Notes (Shared)
[facility] EMERGENCY DEPT  EMERGENCY DEPARTMENT ENCOUNTER      Pt Name: Richard Bruce  MRN: [id number]  Birthdate [birth year]  Date of evaluation: 06/28/2022  Provider: Marissia Blackham Festus Barren, MD    CHIEF COMPLAINT       Chief Complaint   Patient presents with    Headache         HISTORY OF PRESENT ILLNESS   (Location/Symptom, Timing/Onset, Context/Setting, Quality, Duration, Modifying Factors, Severity)  Note limiting factors.   Richard Bruce is a 46 y.o. male who presents to the emergency department for a headache.  He describes it as a bilateral frontal headache that started earlier today.  He states he has had this type of headache before.  Feels like past headaches.  He took some Tylenol and BC powders with no significant improvement in his symptoms.  He states he has previously seen a specialist for this but he retired.  Was not out in the sun.  Does not feel significantly thirsty or dehydrated.  Denies any vision changes or ringing in his ears.  Denies any trauma or injury.  Request something to eat.     Nursing Notes were reviewed.    REVIEW OF SYSTEMS    (2-9 systems for level 4, 10 or more for level 5)     Constitutional: [no fever]  HENT: [no ear pain]  Eyes: [no change in vision]  Respiratory: [no SOB]  Cardio: [no chest pain]  GI: [no blood in stool]  GU: [no hematuria]  MSK: [no back pain]  Skin: [no rashes]  Neuro: Positive for headache    Except as noted above the remainder of the review of systems was reviewed and negative.       PAST MEDICAL HISTORY   No past medical history on file.      SURGICAL HISTORY     No past surgical history on file.      CURRENT MEDICATIONS       Previous Medications    No medications on file       ALLERGIES     Patient has no known allergies.    FAMILY HISTORY     No family history on file.       SOCIAL HISTORY       Social History     Socioeconomic History    Marital status: Single       SCREENINGS                               CIWA Assessment  BP: 111/66  Pulse: 61                 PHYSICAL  EXAM    (up to 7 for level 4, 8 or more for level 5)     ED Triage Vitals [06/28/22 2259]   BP Temp Temp Source Pulse Respirations SpO2 Height Weight   111/66 98.8 F (37.1 C) Oral 61 18 100 % -- --       Physical Exam    General: [No acute distress]  Head: [Normocephalic, atraumatic]  Psych: [cooperative and alert]  Eyes: [No scleral icterus, normal conjunctiva], extract motion intact  ENT: [moist oral mucosa]  Neck: Supple, nonrigid, normal range of motion  CV: Regular rate and rhythm, palpable radial pulses  Pulm: Clear breath sounds bilaterally without any wheezing or rhonchi, normal respiratory rate  GI: Normal bowel sounds, soft, non-tender  MSK: Moves all four extremities  Skin: No rashes  Neuro: Alert and conversive, face is symmetrical, tongue protrudes midline, vision and hearing grossly intact, normal speech, 5/5 strength bilateral upper and lower extremities, no pronator drift, finger-to-nose cerebellar testing is within normal limits, no extinction, sensation grossly intact, can name common objects, alert and oriented x4          DIAGNOSTIC RESULTS     EKG: All EKG's are interpreted by the Emergency Department Physician who either signs or Co-signs this chart in the absence of a cardiologist.        RADIOLOGY:   Non-plain film images such as CT, Ultrasound and MRI are read by the radiologist. Plain radiographic images are visualized and preliminarily interpreted by the emergency physician with the below findings:        Interpretation per the Radiologist below, if available at the time of this note:    No orders to display         ED BEDSIDE ULTRASOUND:   Performed by ED Physician - none    LABS:  Labs Reviewed - No data to display    All other labs were within normal range or not returned as of this dictation.    EMERGENCY DEPARTMENT COURSE and DIFFERENTIAL DIAGNOSIS/MDM:   Vitals:    Vitals:    06/28/22 2259   BP: 111/66   Pulse: 61   Resp: 18   Temp: 98.8 F (37.1 C)   TempSrc: Oral   SpO2: 100%        Medical Decision Making  Risk  OTC drugs.  Prescription drug management.    Patient is a 46 year old male presenting with a headache.  Patient has normal neurologic exam and clinically appears well.  States has had this type of headache before.  Overall no concerning findings of acute intracranial trauma, bleed or infection at this time.  He is otherwise well-appearing.  His normal gait.  We will treat him with a headache cocktail.  Since he is already taking oral medications I did offer oral versus IV and patient prefer to proceed with IV medications therefore these are done.             CRITICAL CARE TIME   Total Critical Care time was 0 minutes, excluding separately reportable procedures.  There was a high probability of clinically significant/life threatening deterioration in the patient's condition which required my urgent intervention.     CONSULTS:  None    PROCEDURES:  Unless otherwise noted below, none     Procedures      FINAL IMPRESSION    No diagnosis found.      DISPOSITION/PLAN   DISPOSITION        PATIENT REFERRED TO:  No follow-up provider specified.    DISCHARGE MEDICATIONS:  New Prescriptions    No medications on file     Controlled Substances Monitoring:     No flowsheet data found.    (Please note that portions of this note were completed with a voice recognition program.  Efforts were made to edit the dictations but occasionally words are mis-transcribed.)    Aaiden Depoy Festus Barren, MD (electronically signed)  Attending Emergency Physician

## 2022-06-29 ENCOUNTER — Inpatient Hospital Stay
Admit: 2022-06-29 | Discharge: 2022-06-29 | Disposition: A | Discharge status: Home / Self Care | Payer: MEDICARE | Attending: Emergency Medicine

## 2022-06-29 MED ORDER — PROCHLORPERAZINE EDISYLATE 10 MG/2ML IJ SOLN
10 MG/2ML | INTRAMUSCULAR | Status: AC
Start: 2022-06-29 — End: 2022-06-29
  Administered 2022-06-29: 07:00:00 10 mg via INTRAVENOUS

## 2022-06-29 MED ORDER — KETOROLAC TROMETHAMINE 15 MG/ML IJ SOLN
15 MG/ML | Freq: Once | INTRAMUSCULAR | Status: AC
Start: 2022-06-29 — End: 2022-06-29
  Administered 2022-06-29: 07:00:00 15 mg via INTRAVENOUS

## 2022-06-29 MED ORDER — DIPHENHYDRAMINE HCL 50 MG/ML IJ SOLN
50 MG/ML | INTRAMUSCULAR | Status: AC
Start: 2022-06-29 — End: 2022-06-29
  Administered 2022-06-29: 07:00:00 25 mg via INTRAVENOUS

## 2022-06-29 MED ORDER — ACETAMINOPHEN 500 MG PO TABS
500 MG | ORAL | Status: AC
Start: 2022-06-29 — End: 2022-06-29
  Administered 2022-06-29: 07:00:00 1000 mg via ORAL

## 2022-06-29 MED ORDER — SODIUM CHLORIDE 0.9 % IV BOLUS
0.9 % | Freq: Once | INTRAVENOUS | Status: AC
Start: 2022-06-29 — End: 2022-06-29
  Administered 2022-06-29: 07:00:00 1000 mL via INTRAVENOUS

## 2022-06-29 MED FILL — PROCHLORPERAZINE EDISYLATE 10 MG/2ML IJ SOLN: 10 MG/2ML | INTRAMUSCULAR | Qty: 2

## 2022-06-29 MED FILL — ACETAMINOPHEN EXTRA STRENGTH 500 MG PO TABS: 500 MG | ORAL | Qty: 2

## 2022-06-29 MED FILL — KETOROLAC TROMETHAMINE 15 MG/ML IJ SOLN: 15 MG/ML | INTRAMUSCULAR | Qty: 1

## 2022-06-29 MED FILL — DIPHENHYDRAMINE HCL 50 MG/ML IJ SOLN: 50 MG/ML | INTRAMUSCULAR | Qty: 1

## 2022-06-29 MED FILL — SODIUM CHLORIDE 0.9 % IV SOLN: 0.9 % | INTRAVENOUS | Qty: 1000

## 2022-07-02 ENCOUNTER — Inpatient Hospital Stay: Admit: 2022-07-02 | Discharge: 2022-07-03 | Payer: MEDICARE | Attending: Emergency Medicine

## 2022-07-02 DIAGNOSIS — R45850 Homicidal ideations: Secondary | ICD-10-CM

## 2022-07-02 LAB — CBC WITH AUTO DIFFERENTIAL
Absolute Immature Granulocyte: 0 10*3/uL (ref 0.00–0.04)
Basophils %: 1 % (ref 0–2)
Basophils Absolute: 0.1 10*3/uL (ref 0.0–0.1)
Eosinophils %: 3 % (ref 0–5)
Eosinophils Absolute: 0.2 10*3/uL (ref 0.0–0.4)
Hematocrit: 34.4 % — ABNORMAL LOW (ref 36.0–48.0)
Hemoglobin: 11.1 g/dL — ABNORMAL LOW (ref 13.0–16.0)
Immature Granulocytes: 0 % (ref 0.0–0.5)
Lymphocytes %: 52 % (ref 21–52)
Lymphocytes Absolute: 3.4 10*3/uL (ref 0.9–3.6)
MCH: 29.8 PG (ref 24.0–34.0)
MCHC: 32.3 g/dL (ref 31.0–37.0)
MCV: 92.2 FL (ref 78.0–100.0)
MPV: 8.9 FL — ABNORMAL LOW (ref 9.2–11.8)
Monocytes %: 7 % (ref 3–10)
Monocytes Absolute: 0.5 10*3/uL (ref 0.05–1.2)
Neutrophils %: 37 % — ABNORMAL LOW (ref 40–73)
Neutrophils Absolute: 2.4 10*3/uL (ref 1.8–8.0)
Nucleated RBCs: 0 PER 100 WBC
Platelets: 348 10*3/uL (ref 135–420)
RBC: 3.73 M/uL — ABNORMAL LOW (ref 4.35–5.65)
RDW: 13.8 % (ref 11.6–14.5)
WBC: 6.6 10*3/uL (ref 4.6–13.2)
nRBC: 0 10*3/uL (ref 0.00–0.01)

## 2022-07-02 LAB — HEPATIC FUNCTION PANEL
ALT: 24 U/L (ref 16–61)
AST: 54 U/L — ABNORMAL HIGH (ref 10–38)
Albumin/Globulin Ratio: 0.9 (ref 0.8–1.7)
Albumin: 3.5 g/dL (ref 3.4–5.0)
Alk Phosphatase: 72 U/L (ref 45–117)
Bilirubin, Direct: <0.1 MG/DL (ref 0.0–0.2)
Globulin: 3.8 g/dL (ref 2.0–4.0)
Total Bilirubin: 0.3 MG/DL (ref 0.2–1.0)
Total Protein: 7.3 g/dL (ref 6.4–8.2)

## 2022-07-02 LAB — URINE DRUG SCREEN
Amphetamine, Urine: NEGATIVE
Barbiturates, Urine: NEGATIVE
Benzodiazepines, Urine: NEGATIVE
Cocaine, Urine: NEGATIVE
Methadone, Urine: NEGATIVE
Opiates, Urine: NEGATIVE
PCP, Urine: NEGATIVE
THC, TH-Cannabinol, Urine: POSITIVE — AB

## 2022-07-02 LAB — BASIC METABOLIC PANEL
Anion Gap: 3 mmol/L (ref 3.0–18)
BUN: 14 MG/DL (ref 7.0–18)
Bun/Cre Ratio: 15 (ref 12–20)
CO2: 29 mmol/L (ref 21–32)
Calcium: 8.6 MG/DL (ref 8.5–10.1)
Chloride: 107 mmol/L (ref 100–111)
Creatinine: 0.95 MG/DL (ref 0.6–1.3)
Est, Glom Filt Rate: >60 mL/min/{1.73_m2} (ref >60)
Glucose: 108 mg/dL — ABNORMAL HIGH (ref 74–99)
Potassium: 3.9 mmol/L (ref 3.5–5.5)
Sodium: 139 mmol/L (ref 136–145)

## 2022-07-02 LAB — ETHANOL: Ethanol Lvl: <3 MG/DL (ref 0–3)

## 2022-07-02 MED ORDER — ALBUTEROL SULFATE (2.5 MG/3ML) 0.083% IN NEBU
Freq: Four times a day (QID) | RESPIRATORY_TRACT | Status: DC | PRN
Start: 2022-07-02 — End: 2022-07-03
  Administered 2022-07-02: 23:00:00 2.5 mg via RESPIRATORY_TRACT

## 2022-07-02 MED FILL — ALBUTEROL SULFATE (2.5 MG/3ML) 0.083% IN NEBU: RESPIRATORY_TRACT | Qty: 3

## 2022-07-02 NOTE — ED Notes (Signed)
Patient placed in hospital scrubs. All belongings removed and in locker number 8 in the ED     Arelia Sneddon, RN  07/02/22 [phone]
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
## 2022-07-02 NOTE — ED Notes (Signed)
Patient medically cleared.  Pending crisis placement for homicidal ideations.     Gerrit Halls, MD  07/03/22 [phone]

## 2022-07-03 LAB — COVID-19 & INFLUENZA COMBO
Rapid Influenza A By PCR: NOT DETECTED
Rapid Influenza B By PCR: NOT DETECTED
SARS-CoV-2, PCR: NOT DETECTED

## 2022-07-03 MED ORDER — HYDROXYZINE HCL 25 MG/ML IM SOLN
25 MG/ML | Freq: Four times a day (QID) | INTRAMUSCULAR | Status: DC | PRN
Start: 2022-07-03 — End: 2022-07-03

## 2022-07-03 MED FILL — HYDROXYZINE HCL 25 MG/ML IM SOLN: 25 MG/ML | INTRAMUSCULAR | Qty: 1

## 2022-07-03 NOTE — ED Notes (Signed)
Pt noted noted in room or in restroom.  Pt eloped      Ritta Slot, RN  07/03/22 1350

## 2022-07-03 NOTE — ED Notes (Signed)
Pt very agitated, pacing room yelling profanity and angry stating he does not feel like he was treated right and wants to go to another hospital.  Pt states his room is hot, and he only received 1 tray of food yesterday. Pt also states he has not had any medications.  Medication list updated and medications discussed with attending.  Pt notified that he will be receiving his daily medications however states he still does not want to stay.  Pt denies suicidal or homicidal ideations at this time and states he just want to go.  Pt notified that crisis will be called for re-evaluation for discharged. Pt repeatedly demanded belongings.       Ritta Slot, RN  07/03/22 1243

## 2022-07-03 NOTE — ED Notes (Signed)
Patient is in bed , resting with eyes closed. Patient is alert and oriented to verbal stimuli.      Keyry Iracheta, RN  07/03/22 0431

## 2022-07-03 NOTE — Other (Signed)
Crisis Note: Patient was also discussed and presented to Dr. Rollen Sox, on-call psychiatrist, who declined admission for patient d/t violent/aggressive behaviors.

## 2022-07-03 NOTE — ED Notes (Signed)
Patient is in bed , resting with eyes closed. Patient is alert and oriented to verbal stimuli.      Adyson Vanburen, RN  07/03/22 0431

## 2022-07-03 NOTE — ED Notes (Signed)
Pt given belongings as requested and notified that we were waiting for crisis re-evaluation       Ritta Slot, RN  07/03/22 1245

## 2022-07-03 NOTE — ED Notes (Signed)
Patient is in bed , resting with eyes closed. Patient is alert and oriented to verbal stimuli.      Mateo Flow, RN  07/03/22 [phone]

## 2022-07-09 ENCOUNTER — Inpatient Hospital Stay
Admit: 2022-07-09 | Discharge: 2022-07-09 | Discharge status: Against Medical Advice | Payer: PRIVATE HEALTH INSURANCE | Attending: Emergency Medicine

## 2022-07-09 DIAGNOSIS — F29 Unspecified psychosis not due to a substance or known physiological condition: Principal | ICD-10-CM

## 2022-07-09 NOTE — ED Notes (Signed)
Pt requested a bus pass to leave, this nurse let Pt know we don't have bus passes. He continued to stay in his room and use his cell phone. Then he walked pass in the hallway & said " my mama here I'm leaving"   Pt walked out. Md made aware.     Irven Lisbon, RN  07/09/22 [phone]

## 2022-07-09 NOTE — ED Triage Notes (Signed)
Pt states he is here to get a psych eval. Denies SI/HI.

## 2022-07-09 NOTE — ED Provider Notes (Addendum)
6:17 AM :Pt care assumed from Dr. Griffin Basil , ED provider. Pt complaint(s), current treatment plan, progression and available diagnostic results have been discussed thoroughly. The patient was seen and evaluated on my shift.   Rounding occurred: Yes  Intended Disposition: TBD  Pending diagnostic reports and/or labs (please list): Psychosis, awaiting crisis eval        Richard Sorrow, MD  07/10/22 [phone]    Patient was seen by the crisis team and it was not recommended that medications are restarted now however the patient will go directly to the [facility] same-day access and the patient be given a bus pass to do that so they can reevaluate him and instruct his medications as they see fit.  The patient has been cleared for discharge by the psychiatrist and the crisis team and will proceed with close outpatient care.    Workup and recommendations were reviewed with the patient and all questions were answered.  The patient understands the plan and will proceed with close outpatient care.  I have encouraged the patient to return if at all worsened or concerned.   Myriam Jacobson, DO 12:46 PM       Richard Sorrow, MD  07/10/22 1248

## 2022-07-09 NOTE — ED Provider Notes (Signed)
The patient was in the room, with music playing loud, and went in to evaluate him and he decided to walk out.  Attempted to evaluate the patient however he decided to leave prior to me being able to have a risk-benefit discussion with him.  Patient eloped prior to full evaluation.Myriam Jacobson, DO 6:45 AM       Alver Sorrow, MD  07/09/22 [phone]

## 2022-07-09 NOTE — ED Triage Notes (Signed)
Pt arrived via EMS with complaint of migraine rated  pain 10/10. Ambulated to bed 11 and talking loudly & cursing demanded " only a black nurse can take care of me" Informed Pt this RN is the only nurse available & he agreed to complete triage questions with male police officer in room.

## 2022-07-09 NOTE — ED Notes (Signed)
Pt states he is here to get his medication to feel better. Pt requesting haldol.      Bernerd Limbo, RN  07/09/22 2143
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
## 2022-07-10 ENCOUNTER — Inpatient Hospital Stay
Admit: 2022-07-10 | Discharge: 2022-07-10 | Disposition: A | Discharge status: Home / Self Care | Payer: MEDICARE | Attending: Emergency Medicine

## 2022-07-10 LAB — URINE DRUG SCREEN
Amphetamine, Urine: NEGATIVE
Barbiturates, Urine: NEGATIVE
Benzodiazepines, Urine: NEGATIVE
Cocaine, Urine: NEGATIVE
Methadone, Urine: NEGATIVE
Opiates, Urine: NEGATIVE
PCP, Urine: NEGATIVE
THC, TH-Cannabinol, Urine: POSITIVE — AB

## 2022-07-10 LAB — CBC
Hematocrit: 37.3 % (ref 36.0–48.0)
Hemoglobin: 11.8 g/dL — ABNORMAL LOW (ref 13.0–16.0)
MCH: 28.9 PG (ref 24.0–34.0)
MCHC: 31.6 g/dL (ref 31.0–37.0)
MCV: 91.4 FL (ref 78.0–100.0)
MPV: 9.1 FL — ABNORMAL LOW (ref 9.2–11.8)
Nucleated RBCs: 0 PER 100 WBC
Platelets: 294 10*3/uL (ref 135–420)
RBC: 4.08 M/uL — ABNORMAL LOW (ref 4.35–5.65)
RDW: 13.6 % (ref 11.6–14.5)
WBC: 6.4 10*3/uL (ref 4.6–13.2)
nRBC: 0 10*3/uL (ref 0.00–0.01)

## 2022-07-10 LAB — COMPREHENSIVE METABOLIC PANEL
ALT: 21 U/L (ref 16–61)
AST: 39 U/L — ABNORMAL HIGH (ref 10–38)
Albumin/Globulin Ratio: 0.9 (ref 0.8–1.7)
Albumin: 3.8 g/dL (ref 3.4–5.0)
Alk Phosphatase: 62 U/L (ref 45–117)
Anion Gap: 4 mmol/L (ref 3.0–18)
BUN: 13 MG/DL (ref 7.0–18)
Bun/Cre Ratio: 12 (ref 12–20)
CO2: 30 mmol/L (ref 21–32)
Calcium: 9.1 MG/DL (ref 8.5–10.1)
Chloride: 104 mmol/L (ref 100–111)
Creatinine: 1.12 MG/DL (ref 0.6–1.3)
Est, Glom Filt Rate: >60 mL/min/{1.73_m2} (ref >60)
Globulin: 4.2 g/dL — ABNORMAL HIGH (ref 2.0–4.0)
Glucose: 83 mg/dL (ref 74–99)
Potassium: 4.2 mmol/L (ref 3.5–5.5)
Sodium: 138 mmol/L (ref 136–145)
Total Bilirubin: 0.4 MG/DL (ref 0.2–1.0)
Total Protein: 8 g/dL (ref 6.4–8.2)

## 2022-07-10 LAB — ETHANOL: Ethanol Lvl: <3 MG/DL (ref 0–3)

## 2022-07-10 NOTE — Suicide Safety Plan (Signed)
SAFETY PLAN     A suicide Safety Plan is a document that supports someone when they are having thoughts of suicide.     Warning Signs that indicate a suicidal crisis may be developing: What (situations, thoughts, feelings, body sensations, behaviors, etc.) do you experience that lets you know you are beginning to think about suicide?    Hopeless, like there is no point in living  Tearful and overwhelmed by negative thoughts  Unbearable pain that you can't imagine ending  Useless, not wanted or not needed by others  Desperate, as if you have no other choice  Like everyone would be better off without you  Cut off from your body or physically numb  Fascinated by death.     What you may experience:  Poor sleep, including waking up earlier than you want to  A change in appetite, weight gain or loss  No desire to take care of yourself, for example neglecting your physical appearance  Wanting to avoid others  Making a will or giving away possessions  Struggling to communicate  Self-loathing and low self-esteem  Urges to self-harm.    Internal Coping Strategies:  What things can I do (relaxation techniques, hobbies, physical activities, etc.) to take my mind off my problems without contacting another person?    Deep breathing/Meditation  Journaling/Writing  Exercising/Going for a walk  Self-talk  Arts and Crafts  Listen to music  Talking to someone who you can trust     People and social settings that provide distraction: Who can I call or where can I go to distract me?  Name: James Cater Relationship to Patient: Caregiver Phone: [phone]    People whom I can ask for help: Who can I call when I need help - for example, friends, family, clergy, someone else?  Name: James Cater Relationship to Patient: Caregiver Phone: [phone]    Professionals or Behavioral Health agencies I can contact during a crisis: Who can I call for help - for example, my doctor, my psychiatrist, my psychologist, a mental health provider, a  suicide hotline?    [facility]  [address] [place] [address]  [phone]     Suicide Prevention Lifeline: 1-800-273-TALK (8255)     Local Behavioral Health Emergency Services - for example, [facility], local county suicide hotline, [employer] Hotline:     [facility]  [address] [place] [address]  [phone]     Suicide Prevention Lifeline: 1-800-273-TALK (8255)      Making the environment safe: How can I make my environment (house/apartment/living space) safer? For example, can I remove guns, medications, and other items?    Remove all medications, weapons, and/or other items out of sight.

## 2022-07-10 NOTE — ED Notes (Signed)
Received report from [place], Charity fundraiser. All questions/ concerns addressed.    Pt resting in position of comfort on cot presenting in no acute/ apparent distress (NAD). Respirations are noted even and unlabored with good rise and fall of the chest observed. Pt updated with current plan of care (POC) and all questions/ concerns addressed. Patient voices no needs at this time. Cot noted in lowest position, locked, with side rails X 2 up for patient safety. Will continue to monitor patient for acute changes.      [x]  Side rails up    [x]  Cart in lowest position    [x]  Sitter at bedside    [x]  Call light within reach         [facility], RN  07/10/22 1158

## 2022-07-10 NOTE — Progress Notes (Signed)
Chaplain completed the initial Spiritual Assessment of the patient, and offered Pastoral Care support to the patient, There is no advance directive on file. Patient does not have any religious/cultural needs that will affect patient's preferences in health care. Chaplains will continue to follow and will provide pastoral care on an as needed/requested basis.    Dewaine Oats  Spiritual Care Department  [phone]

## 2022-07-10 NOTE — Other (Signed)
Crisis Note: Crisis is not recommending inpatient treatment d/t patient is not endorsing SI/HI or psychotic behaviors. Discussed with on-call psychiatrist and Dr. Fortino Sic agreed with disposition. Crisis recommends outpatient services and/or visiting [facility]. Crisis provided a safety plan. Will inform charge nurse and ED physician.

## 2022-07-10 NOTE — ED Notes (Signed)
Patient noted stable and presenting in no acute distress. Discharge instructions and medication list reviewed (as required) with the patient. All questions and concerns were addressed at this time, and the patient expresses understanding. Patient released with vitals noted as recorded.        Jabier Gauss, RN  07/10/22 1248

## 2022-07-10 NOTE — Other (Deleted)
SAFETY PLAN     A suicide Safety Plan is a document that supports someone when they are having thoughts of suicide.     Warning Signs that indicate a suicidal crisis may be developing: What (situations, thoughts, feelings, body sensations, behaviors, etc.) do you experience that lets you know you are beginning to think about suicide?    Hopeless, like there is no point in living  Tearful and overwhelmed by negative thoughts  Unbearable pain that you can't imagine ending  Useless, not wanted or not needed by others  Desperate, as if you have no other choice  Like everyone would be better off without you  Cut off from your body or physically numb  Fascinated by death.     What you may experience:  Poor sleep, including waking up earlier than you want to  A change in appetite, weight gain or loss  No desire to take care of yourself, for example neglecting your physical appearance  Wanting to avoid others  Making a will or giving away possessions  Struggling to communicate  Self-loathing and low self-esteem  Urges to self-harm.    Internal Coping Strategies:  What things can I do (relaxation techniques, hobbies, physical activities, etc.) to take my mind off my problems without contacting another person?    Deep breathing/Meditation  Journaling/Writing  Exercising/Going for a walk  Self-talk  Arts and Crafts  Listen to music  Talking to someone who you can trust     People and social settings that provide distraction: Who can I call or where can I go to distract me?  Name: Jolyn Lent Relationship to Patient: Caregiver Phone: [phone]    People whom I can ask for help: Who can I call when I need help - for example, friends, family, clergy, someone else?  Name: Jolyn Lent Relationship to Patient: Caregiver Phone: [phone]    Professionals or Behavioral Health agencies I can contact during a crisis: Who can I call for help - for example, my doctor, my psychiatrist, my psychologist, a mental health provider, a  suicide hotline?    [facility]  [address]   [place], [place] [address]  [phone]     Suicide Prevention Lifeline: 612-180-1250 929-527-7652)     Local Behavioral Health Emergency Services - for example, [facility], local county suicide hotline, [place] Hotline:     [facility]  [address]   [place], [place] [address]  [phone]     Suicide Prevention Lifeline: 1-800-273-TALK 587-379-4696)      Making the environment safe: How can I make my environment (house/apartment/living space) safer? For example, can I remove guns, medications, and other items?    Remove all medications, weapons, and/or other items out of sight.

## 2022-07-10 NOTE — Discharge Instructions (Signed)
Make sure to go to the [facility] same-day access he can get your medications.  The crisis team is given you a detailed plan of how to do that.  Please return if you are at all worsened or concerned.

## 2022-07-10 NOTE — ED Notes (Signed)
Pt laying with eyes closed.  Respirations even and unlabored     Clayton Bibles, RN  07/10/22 [phone]
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
## 2022-09-15 ENCOUNTER — Emergency Department
Admission: EM | Admit: 2022-09-15 | Discharge: 2022-09-16 | Disposition: A | Discharge status: Home / Self Care | Payer: Medicare Other | Attending: Emergency Medicine | Admitting: Emergency Medicine

## 2022-09-15 ENCOUNTER — Other Ambulatory Visit: Payer: Self-pay

## 2022-09-15 ENCOUNTER — Encounter: Payer: Self-pay | Admitting: Emergency Medicine

## 2022-09-15 DIAGNOSIS — R45850 Homicidal ideations: Secondary | ICD-10-CM | POA: Insufficient documentation

## 2022-09-15 DIAGNOSIS — Z91199 Patient's noncompliance with other medical treatment and regimen due to unspecified reason: Secondary | ICD-10-CM

## 2022-09-15 DIAGNOSIS — R4689 Other symptoms and signs involving appearance and behavior: Secondary | ICD-10-CM

## 2022-09-15 DIAGNOSIS — E162 Hypoglycemia, unspecified: Secondary | ICD-10-CM | POA: Diagnosis not present

## 2022-09-15 DIAGNOSIS — J45909 Unspecified asthma, uncomplicated: Secondary | ICD-10-CM | POA: Diagnosis not present

## 2022-09-15 DIAGNOSIS — F1994 Other psychoactive substance use, unspecified with psychoactive substance-induced mood disorder: Secondary | ICD-10-CM | POA: Diagnosis present

## 2022-09-15 DIAGNOSIS — F17210 Nicotine dependence, cigarettes, uncomplicated: Secondary | ICD-10-CM | POA: Insufficient documentation

## 2022-09-15 DIAGNOSIS — F1210 Cannabis abuse, uncomplicated: Secondary | ICD-10-CM | POA: Diagnosis not present

## 2022-09-15 DIAGNOSIS — F209 Schizophrenia, unspecified: Secondary | ICD-10-CM | POA: Diagnosis not present

## 2022-09-15 DIAGNOSIS — F419 Anxiety disorder, unspecified: Secondary | ICD-10-CM | POA: Diagnosis not present

## 2022-09-15 DIAGNOSIS — Z20822 Contact with and (suspected) exposure to covid-19: Secondary | ICD-10-CM | POA: Insufficient documentation

## 2022-09-15 DIAGNOSIS — R456 Violent behavior: Secondary | ICD-10-CM | POA: Insufficient documentation

## 2022-09-15 DIAGNOSIS — R000 Tachycardia, unspecified: Secondary | ICD-10-CM | POA: Insufficient documentation

## 2022-09-15 DIAGNOSIS — Z046 Encounter for general psychiatric examination, requested by authority: Secondary | ICD-10-CM | POA: Diagnosis present

## 2022-09-15 DIAGNOSIS — R440 Auditory hallucinations: Secondary | ICD-10-CM | POA: Diagnosis not present

## 2022-09-15 DIAGNOSIS — F1914 Other psychoactive substance abuse with psychoactive substance-induced mood disorder: Secondary | ICD-10-CM | POA: Diagnosis not present

## 2022-09-15 LAB — COMPREHENSIVE METABOLIC PANEL
ALT: 13 U/L (ref 0–44)
AST: 29 U/L (ref 15–41)
Albumin: 4.5 g/dL (ref 3.5–5.0)
Alkaline Phosphatase: 57 U/L (ref 38–126)
Anion gap: 10 (ref 5–15)
BUN: 14 mg/dL (ref 6–20)
CO2: 26 mmol/L (ref 22–32)
Calcium: 9.6 mg/dL (ref 8.9–10.3)
Chloride: 105 mmol/L (ref 98–111)
Creatinine, Ser: 1.22 mg/dL (ref 0.61–1.24)
GFR, Estimated: >60 mL/min (ref >60)
Glucose, Bld: 69 mg/dL — ABNORMAL LOW (ref 70–99)
Potassium: 4 mmol/L (ref 3.5–5.1)
Sodium: 141 mmol/L (ref 135–145)
Total Bilirubin: 0.7 mg/dL (ref 0.3–1.2)
Total Protein: 8.3 g/dL — ABNORMAL HIGH (ref 6.5–8.1)

## 2022-09-15 LAB — CBC
HCT: 35.4 % — ABNORMAL LOW (ref 39.0–52.0)
Hemoglobin: 11 g/dL — ABNORMAL LOW (ref 13.0–17.0)
MCH: 28.1 pg (ref 26.0–34.0)
MCHC: 31.1 g/dL (ref 30.0–36.0)
MCV: 90.5 fL (ref 80.0–100.0)
Platelets: 324 10*3/uL (ref 150–400)
RBC: 3.91 MIL/uL — ABNORMAL LOW (ref 4.22–5.81)
RDW: 13.3 % (ref 11.5–15.5)
WBC: 8.4 10*3/uL (ref 4.0–10.5)
nRBC: 0 % (ref 0.0–0.2)

## 2022-09-15 MED ORDER — CLONAZEPAM 0.5 MG PO TABS
1.0000 mg | ORAL_TABLET | ORAL | Status: AC
  Administered 2022-09-15: 1 mg via ORAL
  Filled 2022-09-15: qty 2

## 2022-09-15 MED ORDER — OLANZAPINE 10 MG PO TABS
10.0000 mg | ORAL_TABLET | ORAL | Status: AC
  Administered 2022-09-15: 10 mg via ORAL
  Filled 2022-09-15: qty 1
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
## 2022-09-15 NOTE — ED Notes (Signed)
Pt had received two sandwich trays and water. Pt was informed they can not have anymore food due to policy.

## 2022-09-15 NOTE — ED Provider Notes (Signed)
[facility] Provider Note    Event Date/Time   First MD Initiated Contact with Patient 09/15/22 2303     (approximate)   History   Psychiatric Evaluation   HPI Level 5 caveat:  history/ROS limited by active psychosis / mental illness / altered mental status   Peter Wilson is a 46 y.o. male who visually has an extensive psychiatric history including schizophrenia versus schizoaffective disorder, substance-induced mood disorder, noncompliance with medication regimen, antisocial traits, aggressive behavior, and adjustment disorder.  He presents voluntarily tonight saying that he needs help because he is hearing voices.  He called [facility] originally and was brought here.  He says the devil is all around him and "I am in business with the devil".  He said that someone at a group home was yelling at him and that "makes the [facility] and me come out, he was a shooter and I am a shooter, and I'm gonna start shooting people."  He said that he gets upset when people yell at him and that makes his and get angry and want to kill people.  He said that he has not been taking his psychiatric medicine since February, so he has been off of them for about 7 months and that has made the voices worse.  He does not want to harm himself.     Physical Exam   Triage Vital Signs: ED Triage Vitals [09/15/22 2229]  Enc Vitals Group     BP 138/88     Pulse Rate (!) 103     Resp 16     Temp 98.8 F (37.1 C)     Temp Source Oral     SpO2 91 %     Weight 68 kg (150 lb)     Height 1.829 m (6')     Head Circumference      Peak Flow      Pain Score 0     Pain Loc      Pain Edu?      Excl. in [place]?     Most recent vital signs: Vitals:   09/15/22 2229  BP: 138/88  Pulse: (!) 103  Resp: 16  Temp: 98.8 F (37.1 C)  SpO2: 91%     General: Awake, alert. CV:  Good peripheral perfusion.  Resp:  Normal effort.  Abd:  No distention.  Other:  Patient is talking constantly about  a variety of topics, frequently religious in nature but also with vaguely aggressive content, talking about people doing him wrong and him shooting them.  He adamantly reports homicidal ideation but he is relatively polite and respectful to ED staff and me.  He made the comment "doc, I'm paranoid as fuck."  He then asked for some medications including "my Klonopin, I take 1 mg".   ED Results / Procedures / Treatments   Labs (all labs ordered are listed, but only abnormal results are displayed) Labs Reviewed  COMPREHENSIVE METABOLIC PANEL - Abnormal; Notable for the following components:      Result Value   Glucose, Bld 69 (*)    Total Protein 8.3 (*)    All other components within normal limits  CBC - Abnormal; Notable for the following components:   RBC 3.91 (*)    Hemoglobin 11.0 (*)    HCT 35.4 (*)    All other components within normal limits  URINE DRUG SCREEN, QUALITATIVE ([facility] ONLY) - Abnormal; Notable for the following components:   Cannabinoid 50 Ng,  Ur  POSITIVE (*)    All other components within normal limits  SARS CORONAVIRUS 2 BY RT PCR  ACETAMINOPHEN LEVEL  SALICYLATE LEVEL  ETHANOL     PROCEDURES:  Critical Care performed: No  Procedures   MEDICATIONS ORDERED IN ED: Medications  OLANZapine (ZYPREXA) tablet 10 mg (10 mg Oral Given 09/15/22 2346)  clonazePAM (KLONOPIN) tablet 1 mg (1 mg Oral Given 09/15/22 2346)     IMPRESSION / MDM / ASSESSMENT AND PLAN / ED COURSE  I reviewed the triage vital signs and the nursing notes.                              Differential diagnosis includes, but is not limited to, schizophrenia, adjustment disorder, mood disorder, bipolar disorder, substance abuse.  Patient's presentation is most consistent with acute presentation with potential threat to life or bodily function.  Vital signs are stable other than very mild tachycardia which resolved once he relaxed in his recliner.  As per protocol, I ordered the following labs  as part of the patient's medical and psychiatric evaluation:  CBC, CMP, ethanol level, acetaminophen level, salicylate level, urine drug screen, COVID swab.  Positive for cannabinoids, no other substances.  Labs are generally reassuring with no significant acute abnormalities other than some mild hypoglycemia and the patient is awake and alert and able to eat and drink.  I reviewed his medical record including multiple prior notes in care everywhere indicating psychiatric illness and prior psychiatric visits most notably in [place].  The patient confirmed this as well.  He has a documented history of schizophrenia and schizoaffective disorder, bipolar type.  He is expressing extensive and graphic homicidal ideation and is exhibiting pressured speech and also reporting paranoia and visual hallucinations.  I feel that he represents a danger to others and possibly to himself.  I placed him under involuntary commitment.  No evidence of any medical issues.  The patient has been placed in psychiatric observation due to the need to provide a safe environment for the patient while obtaining psychiatric consultation and evaluation, as well as ongoing medical and medication management to treat the patient's condition.  The patient has been placed under full IVC at this time.  Consulted psychiatry and spoke in person with Anette Riedel who evaluated the patient in the emergency department.  She agrees that he meets involuntary and IVC criteria and he will be admitted to the BMU.       FINAL CLINICAL IMPRESSION(S) / ED DIAGNOSES   Final diagnoses:  Aggressive behavior  Schizophrenia, unspecified type ([place])  Auditory hallucinations     Rx / DC Orders   ED Discharge Orders     None        Note:  This document was prepared using Dragon voice recognition software and may include unintentional dictation errors.   Hinda Kehr, MD 09/16/22 [phone]

## 2022-09-15 NOTE — ED Notes (Signed)
Pt clothes in pt's belonging bag:  Black with red pair of shoes, black underwear, brown pair of sleepers, white t-shirts, black top, grey shorts, blur headphone, cell phone, lighter, cigarettes, pair of white socks.

## 2022-09-15 NOTE — ED Notes (Signed)
Pt called this tech over to passionately tell this tech about things that he said have happened in his life. He also requested something to eat. This tech offered a meal tray with a sandwich, potato chips, and graham crackers, along with a cup of ice-water. Pt accepted.

## 2022-09-15 NOTE — ED Notes (Signed)
First Nurse Note: Pt presents Voluntarily with Loss adjuster, chartered. Pt states he "needs my brain looked at". Pt has several bags of belongings with him stating he has no place to go right.

## 2022-09-16 DIAGNOSIS — R4689 Other symptoms and signs involving appearance and behavior: Secondary | ICD-10-CM | POA: Diagnosis not present

## 2022-09-16 DIAGNOSIS — R440 Auditory hallucinations: Secondary | ICD-10-CM | POA: Diagnosis not present

## 2022-09-16 DIAGNOSIS — Z91199 Patient's noncompliance with other medical treatment and regimen due to unspecified reason: Secondary | ICD-10-CM | POA: Diagnosis not present

## 2022-09-16 DIAGNOSIS — F1994 Other psychoactive substance use, unspecified with psychoactive substance-induced mood disorder: Secondary | ICD-10-CM

## 2022-09-16 DIAGNOSIS — F200 Paranoid schizophrenia: Secondary | ICD-10-CM

## 2022-09-16 DIAGNOSIS — F209 Schizophrenia, unspecified: Secondary | ICD-10-CM

## 2022-09-16 LAB — URINE DRUG SCREEN, QUALITATIVE (ARMC ONLY)
Amphetamines, Ur Screen: NOT DETECTED
Barbiturates, Ur Screen: NOT DETECTED
Benzodiazepine, Ur Scrn: NOT DETECTED
Cannabinoid 50 Ng, Ur ~~LOC~~: POSITIVE — AB
Cocaine Metabolite,Ur ~~LOC~~: NOT DETECTED
MDMA (Ecstasy)Ur Screen: NOT DETECTED
Methadone Scn, Ur: NOT DETECTED
Opiate, Ur Screen: NOT DETECTED
Phencyclidine (PCP) Ur S: NOT DETECTED
Tricyclic, Ur Screen: NOT DETECTED

## 2022-09-16 LAB — SARS CORONAVIRUS 2 BY RT PCR: SARS Coronavirus 2 by RT PCR: NEGATIVE

## 2022-09-16 MED ORDER — BENZTROPINE MESYLATE 1 MG PO TABS: 1.0000 mg | ORAL_TABLET | Freq: Every day | ORAL | Status: DC

## 2022-09-16 MED ORDER — HALOPERIDOL 5 MG PO TABS: 5.0000 mg | ORAL_TABLET | Freq: Two times a day (BID) | ORAL | Status: DC

## 2022-09-16 MED ORDER — DIVALPROEX SODIUM 500 MG PO DR TAB: 750.0000 mg | DELAYED_RELEASE_TABLET | Freq: Two times a day (BID) | ORAL | Status: DC

## 2022-09-16 MED ORDER — OLANZAPINE 10 MG PO TABS: 10.0000 mg | ORAL_TABLET | Freq: Two times a day (BID) | ORAL | Status: DC

## 2022-09-16 NOTE — ED Provider Notes (Signed)
Patient was seen by psychiatry and was cleared.  IVC has been rescinded.   Rada Hay, MD 09/16/22 [phone]

## 2022-09-16 NOTE — ED Notes (Signed)
Patient moved to rm 25 to be out of direct line of site of officer due to patient continuing to state he does not feel safe and he is going to leave.

## 2022-09-16 NOTE — Consult Note (Signed)
[facility] Face-to-Face Psychiatry Consult   Reason for Consult:  Psych Evaluation Referring Physician:  Dr. York Cerise Patient Identification: Peter Wilson MRN:  [id number] Principal Diagnosis: <principal problem not specified> Diagnosis:  Active Problems:   Noncompliance   Schizophrenia (HCC)   Substance induced mood disorder (HCC)   Total Time spent with patient: 45 minutes  Subjective:   "Something is wrong with my head and it need to be checked"  HPI:  Peter Wilson, 46 y.o., male patient seen by this provider, consulted with Dr. York Cerise; and chart reviewed on 09/16/22.  On evaluation Peter Wilson reports that he needs his head examined because its not feeling right.  Per triage note, pt presents voluntarily with [facility] worker. Pt states he "needs my brain looked at". Pt has several bags of belongings with him stating he has no place to go right. He also reports he has the devil around him. He states " the devil has me shooting people all around."  Per EDP HPI, "he presents voluntarily tonight saying that he needs help because he is hearing voices.  He called [facility] originally and was brought here.  He says the devil is all around him and "I am in business with the devil".  He said that someone at a group home was yelling at him and that "makes the [facility] and me come out, he was a shooter and I am a shooter, and I'm gonna start shooting people."  He said that he gets upset when people yell at him and that makes his and get angry and want to kill people." He said that he has not been taking his psychiatric medicine since February, so he has been off of them for about 7 months and that has made the voices worse.  He does not want to harm himself."  During evaluation Peter Wilson is sitting up in a chair and in no acute distress.  He is alert, oriented x 4, "rowdy/loud/belligerent".  Patient's mood is erratic/labile and paranoid with congruent affect.  He does not appear to be responding to  internal/external stimuli but does show evidence of delusional thoughts.  Patient denies suicidal/self-harm but endorses homicidal ideation, psychosis, and paranoia.  Patient answered question appropriately.   Past Psychiatric History: Schizophrenia   Risk to Self:   Risk to Others:   Prior Inpatient Therapy:   Prior Outpatient Therapy:    Past Medical History:  Past Medical History:  Diagnosis Date   Marijuana abuse    Schizophrenia, paranoid (HCC)    Tardive dyskinesia    Tobacco abuse     Past Surgical History:  Procedure Laterality Date   NO PAST SURGERIES     Family History: No family history on file. Family Psychiatric  History:  Social History:  Social History   Substance and Sexual Activity  Alcohol Use Yes   Alcohol/week: 0.0 standard drinks of alcohol   Comment: Unknown      Social History   Substance and Sexual Activity  Drug Use Yes   Frequency: 7.0 times per week   Types: Marijuana, Cocaine   Comment: Unknown     Social History   Socioeconomic History   Marital status: Legally Separated    Spouse name: Not on file   Number of children: Not on file   Years of education: Not on file   Highest education level: Not on file  Occupational History   Occupation: Disability  Tobacco Use   Smoking status: Every Day    Packs/day: 2.00  Types: Cigarettes   Smokeless tobacco: Never   Tobacco comments:    Unknown   Substance and Sexual Activity   Alcohol use: Yes    Alcohol/week: 0.0 standard drinks of alcohol    Comment: Unknown    Drug use: Yes    Frequency: 7.0 times per week    Types: Marijuana, Cocaine    Comment: Unknown    Sexual activity: Not on file  Other Topics Concern   Not on file  Social History Narrative   Pt discharged from [facility] on 10/10/2019, was assigned to a hotel in [place] by his [facility] provider[facility] [place], [place].     Social Determinants of Health   Financial Resource Strain: Not on file  Food  Insecurity: Not on file  Transportation Needs: Not on file  Physical Activity: Not on file  Stress: Not on file  Social Connections: Not on file   Additional Social History:    Allergies:   Allergies  Allergen Reactions   Chlorpromazine Anaphylaxis   Morphine Anaphylaxis   Ibuprofen Hives and Itching    "I don't know"    Penicillins Rash and Hives    Did it involve swelling of the face/tongue/throat, SOB, or low BP? U Did it involve sudden or severe rash/hives, skin peeling, or any reaction on the inside of your mouth or nose? Y  Did you need to seek medical attention at a hospital or doctor's office? Y When did it last happen?    childhood If all above answers are "NO", may proceed with cephalosporin use.  Did it involve swelling of the face/tongue/throat, SOB, or low BP? U Did it involve sudden or severe rash/hives, skin peeling, or any reaction on the inside of your mouth or nose? Y  Did you need to seek medical attention at a hospital or doctor's office? Y When did it last happen?    childhood If all above answers are "NO", may proceed with cephalosporin use.    Labs:  Results for orders placed or performed during the hospital encounter of 09/15/22 (from the past 48 hour(s))  Comprehensive metabolic panel     Status: Abnormal   Collection Time: 09/15/22 10:38 PM  Result Value Ref Range   Sodium 141 135 - 145 mmol/L   Potassium 4.0 3.5 - 5.1 mmol/L   Chloride 105 98 - 111 mmol/L   CO2 26 22 - 32 mmol/L   Glucose, Bld 69 (L) 70 - 99 mg/dL    Comment: Glucose reference range applies only to samples taken after fasting for at least 8 hours.   BUN 14 6 - 20 mg/dL   Creatinine, Ser 9.83 0.61 - 1.24 mg/dL   Calcium 9.6 8.9 - 38.2 mg/dL   Total Protein 8.3 (H) 6.5 - 8.1 g/dL   Albumin 4.5 3.5 - 5.0 g/dL   AST 29 15 - 41 U/L   ALT 13 0 - 44 U/L   Alkaline Phosphatase 57 38 - 126 U/L   Total Bilirubin 0.7 0.3 - 1.2 mg/dL   GFR, Estimated >50 >53 mL/min    Comment:  (NOTE) Calculated using the CKD-EPI Creatinine Equation (2021)    Anion gap 10 5 - 15    Comment: Performed at [facility], [address] [place], [place] [address]  cbc     Status: Abnormal   Collection Time: 09/15/22 10:38 PM  Result Value Ref Range   WBC 8.4 4.0 - 10.5 K/uL   RBC 3.91 (L) 4.22 -  5.81 MIL/uL   Hemoglobin 11.0 (L) 13.0 - 17.0 g/dL   HCT 92.1 (L) 19.4 - 17.4 %   MCV 90.5 80.0 - 100.0 fL   MCH 28.1 26.0 - 34.0 pg   MCHC 31.1 30.0 - 36.0 g/dL   RDW 08.1 44.8 - 18.5 %   Platelets 324 150 - 400 K/uL   nRBC 0.0 0.0 - 0.2 %    Comment: Performed at [facility], [address] [place], [place] [address]  Urine Drug Screen, Qualitative     Status: Abnormal   Collection Time: 09/16/22 12:19 AM  Result Value Ref Range   Tricyclic, Ur Screen NONE DETECTED NONE DETECTED   Amphetamines, Ur Screen NONE DETECTED NONE DETECTED   MDMA (Ecstasy)Ur Screen NONE DETECTED NONE DETECTED   Cocaine Metabolite,Ur [place] NONE DETECTED NONE DETECTED   Opiate, Ur Screen NONE DETECTED NONE DETECTED   Phencyclidine (PCP) Ur S NONE DETECTED NONE DETECTED   Cannabinoid 50 Ng, Ur [place] POSITIVE (A) NONE DETECTED   Barbiturates, Ur Screen NONE DETECTED NONE DETECTED   Benzodiazepine, Ur Scrn NONE DETECTED NONE DETECTED   Methadone Scn, Ur NONE DETECTED NONE DETECTED    Comment: (NOTE) Tricyclics + metabolites, urine    Cutoff 1000 ng/mL Amphetamines + metabolites, urine  Cutoff 1000 ng/mL MDMA (Ecstasy), urine              Cutoff 500 ng/mL Cocaine Metabolite, urine          Cutoff 300 ng/mL Opiate + metabolites, urine        Cutoff 300 ng/mL Phencyclidine (PCP), urine         Cutoff 25 ng/mL Cannabinoid, urine                 Cutoff 50 ng/mL Barbiturates + metabolites, urine  Cutoff 200 ng/mL Benzodiazepine, urine              Cutoff 200 ng/mL Methadone, urine                   Cutoff 300 ng/mL  The urine drug screen provides only a preliminary, unconfirmed analytical  test result and should not be used for non-medical purposes. Clinical consideration and professional judgment should be applied to any positive drug screen result due to possible interfering substances. A more specific alternate chemical method must be used in order to obtain a confirmed analytical result. Gas chromatography / mass spectrometry (GC/MS) is the preferred confirm atory method. Performed at [facility], [address] [place], [place] [address]     No current facility-administered medications for this encounter.   Current Outpatient Medications  Medication Sig Dispense Refill   benztropine (COGENTIN) 1 MG tablet Take 1 mg by mouth daily. (Patient not taking: Reported on 09/15/2022)     clonazePAM (KLONOPIN) 1 MG tablet Take 1 mg by mouth at bedtime as needed. (Patient not taking: Reported on 09/15/2022)     divalproex (DEPAKOTE) 250 MG DR tablet Take 3 tablets by mouth 2 (two) times daily. (Patient not taking: Reported on 09/15/2022)     hydrOXYzine (ATARAX) 25 MG tablet Take 25 mg by mouth every 8 (eight) hours as needed. (Patient not taking: Reported on 09/15/2022)      Musculoskeletal: Strength & Muscle Tone: within normal limits Gait & Station: normal Patient leans: N/A  Psychiatric Specialty Exam:  Presentation  General Appearance: Appropriate for Environment  Eye Contact:Fair  Speech:Clear and Coherent  Speech Volume:Increased  Handedness:Right   Mood and  Affect  Mood:Irritable; Labile; Depressed; Anxious  Affect:Labile; Full Range   Thought Process  Thought Processes:Disorganized; Irrevelant  Descriptions of Associations:Circumstantial  Orientation:Full (Time, Place and Person)  Thought Content:Illogical; Paranoid Ideation  History of Schizophrenia/Schizoaffective disorder:Yes  Duration of Psychotic Symptoms:Greater than six months  Hallucinations:Hallucinations: Auditory  Ideas of Reference:None  Suicidal Thoughts:Suicidal  Thoughts: No  Homicidal Thoughts:Homicidal Thoughts: Yes, Active HI Passive Intent and/or Plan: Without Intent   Sensorium  Memory:Other (comment)  Judgment:Impaired  Insight:Lacking   Executive Functions  Concentration:Poor  Attention Span:Poor  Recall:Poor  Fund of Knowledge:Poor  Language:Poor   Psychomotor Activity  Psychomotor Activity:Psychomotor Activity: Restlessness   Assets  Assets:Communication Skills; Desire for Improvement   Sleep  Sleep:Sleep: Poor   Physical Exam: Physical Exam Vitals and nursing note reviewed.  Constitutional:      Appearance: He is toxic-appearing.  HENT:     Head: Normocephalic and atraumatic.     Mouth/Throat:     Mouth: Mucous membranes are moist.  Eyes:     Pupils: Pupils are equal, round, and reactive to light.  Pulmonary:     Effort: Pulmonary effort is normal.  Musculoskeletal:        General: Normal range of motion.     Cervical back: Normal range of motion.  Skin:    General: Skin is warm and dry.  Neurological:     Mental Status: He is alert and oriented to person, place, and time.  Psychiatric:        Attention and Perception: Attention and perception normal.        Mood and Affect: Mood is anxious and elated. Affect is labile, angry and inappropriate.        Speech: Speech normal.        Behavior: Behavior is agitated, aggressive, hyperactive and combative. Behavior is cooperative.        Thought Content: Thought content is paranoid and delusional. Thought content includes homicidal ideation.        Cognition and Memory: Cognition is impaired.        Judgment: Judgment is impulsive and inappropriate.    Review of Systems  Psychiatric/Behavioral:  Positive for depression and hallucinations. The patient has insomnia.   All other systems reviewed and are negative.  Blood pressure 138/88, pulse (!) 103, temperature 98.8 F (37.1 C), temperature source Oral, resp. rate 16, height 6' (1.829 m), weight 68  kg, SpO2 91 %. Body mass index is 20.34 kg/m.  Treatment Plan Summary: Daily contact with patient to assess and evaluate symptoms and progress in treatment, Medication management, and Plan   Plan:  Review of chart, vital signs, medications, and notes. 1-Individual and group therapy 2-Medication management for depression and anxiety:  Medications reviewed with the patient and he stated no untoward effects, no changes made 3-Coping skills for depression, anxiety, and Schizophrenia 4-Continue crisis stabilization and management 5-Address health issues--monitoring vital signs, stable 6-Treatment plan in progress to prevent relapse of depression and anxiety   Disposition: Recommend psychiatric Inpatient admission when medically cleared. Supportive therapy provided about ongoing stressors. Refer to IOP. Discussed crisis plan, support from social network, calling 911, coming to the Emergency Department, and calling Suicide Hotline.  Deloria Lair, NP 09/16/2022 2:52 AM

## 2022-09-16 NOTE — ED Notes (Addendum)
Pt irritable wanting to leave ASAP and get his belongings back. Belongings given back to patient. Pt is alert oriented, ambulatory with steady gait. Attempted to call [facility], no answer. Per NP, Ok to dc back to group home.

## 2022-09-16 NOTE — ED Notes (Signed)
Patient continues to speak loudly and states that he is triggered by police officers with guns and that he does not want to be around the officer that is in the quad.  Attempted to explain to patient that the officer was not there to hurt him and he will be safe but patient continues to get louder and louder.

## 2022-09-16 NOTE — ED Notes (Signed)
Ivc / psych consult complete / recommend psych inpatient admission when medically cleared 
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
## 2022-09-16 NOTE — ED Notes (Signed)
Verified with MD and [facility] NP dispo status. Per NP, okay to dc patient back to the group home, no legal guardian found in chart, pt denies having a legal guardian. Pt states he will call his case worker to take him back to the group home [facility] at [address] [place].
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
## 2022-09-21 ENCOUNTER — Emergency Department
Admission: EM | Admit: 2022-09-21 | Discharge: 2022-09-21 | Disposition: A | Discharge status: Home / Self Care | Payer: Medicare Other | Attending: Emergency Medicine | Admitting: Emergency Medicine

## 2022-09-21 ENCOUNTER — Encounter: Payer: Self-pay | Admitting: Emergency Medicine

## 2022-09-21 ENCOUNTER — Other Ambulatory Visit: Payer: Self-pay

## 2022-09-21 DIAGNOSIS — F209 Schizophrenia, unspecified: Secondary | ICD-10-CM | POA: Diagnosis not present

## 2022-09-21 DIAGNOSIS — Z760 Encounter for issue of repeat prescription: Secondary | ICD-10-CM | POA: Diagnosis not present

## 2022-09-21 MED ORDER — CLONAZEPAM 1 MG PO TABS: 1.0000 mg | ORAL_TABLET | Freq: Two times a day (BID) | ORAL | 0 refills | Status: DC

## 2022-09-21 MED ORDER — DIVALPROEX SODIUM 500 MG PO DR TAB: 500.0000 mg | DELAYED_RELEASE_TABLET | Freq: Every day | ORAL | 0 refills | Status: DC

## 2022-09-21 MED ORDER — BENZTROPINE MESYLATE 1 MG PO TABS: 1.0000 mg | ORAL_TABLET | Freq: Two times a day (BID) | ORAL | 0 refills | Status: DC

## 2022-09-21 NOTE — ED Notes (Signed)
Report given to group home staff. PT released to staff member.

## 2022-09-21 NOTE — Discharge Instructions (Signed)
Your appointment with your doctor in 2 weeks.

## 2022-09-21 NOTE — ED Triage Notes (Signed)
Patient to ED for medication refill. Patient states he does not have insurance and has not been able to get in with doctor. Needs: haldol detonate, cogentin, clonazepam, and divalproex.

## 2022-09-21 NOTE — ED Provider Notes (Signed)
[facility] Provider Note    Event Date/Time   First MD Initiated Contact with Patient 09/21/22 1436     (approximate)   History   Medication Refill   HPI  Peter Wilson is a 46 y.o. male   here for medication refill.  Patient is in a group home and has a diagnosis of schizophrenia.  He has an appointment with a doctor in 2 weeks at which time he can get all his medications refilled.  The group home has his paperwork and medications listed as he is taking them currently.      Physical Exam   Triage Vital Signs: ED Triage Vitals  Enc Vitals Group     BP 09/21/22 1220 115/75     Pulse Rate 09/21/22 1220 70     Resp 09/21/22 1220 18     Temp 09/21/22 1220 98 F (36.7 C)     Temp src --      SpO2 09/21/22 1220 99 %     Weight 09/21/22 1218 160 lb (72.6 kg)     Height 09/21/22 1218 6' (1.829 m)     Head Circumference --      Peak Flow --      Pain Score 09/21/22 1218 0     Pain Loc --      Pain Edu? --      Excl. in GC? --     Most recent vital signs: Vitals:   09/21/22 1220  BP: 115/75  Pulse: 70  Resp: 18  Temp: 98 F (36.7 C)  SpO2: 99%     General: Awake, no distress.  Active.  Talkative. CV:  Good peripheral perfusion.  Resp:  Normal effort.  Abd:  No distention.  Other:     ED Results / Procedures / Treatments   Labs (all labs ordered are listed, but only abnormal results are displayed) Labs Reviewed - No data to display    PROCEDURES:  Critical Care performed:   Procedures   MEDICATIONS ORDERED IN ED: Medications - No data to display   IMPRESSION / MDM / ASSESSMENT AND PLAN / ED COURSE  I reviewed the triage vital signs and the nursing notes.   Differential diagnosis includes, but is not limited to, medication refill  46 year old male presents to the ED for medication refill until he can see his psychiatrist in 2 weeks.  Patient needs refill on Cogentin, Klonopin and Depakote.  His patient medications  are listed with his papers and the caregiver at the group home is with patient.  Medications were sent to his pharmacy and he is encouraged to keep that appointment in 2 weeks for continued medication refills.      Patient's presentation is most consistent with acute, uncomplicated illness.  FINAL CLINICAL IMPRESSION(S) / ED DIAGNOSES   Final diagnoses:  Encounter for medication refill     Rx / DC Orders   ED Discharge Orders          Ordered    benztropine (COGENTIN) 1 MG tablet  2 times daily        09/21/22 1531    clonazePAM (KLONOPIN) 1 MG tablet  2 times daily        09/21/22 1531    divalproex (DEPAKOTE) 500 MG DR tablet  Daily at bedtime        09/21/22 1531             Note:  This document was prepared using Dragon voice  recognition software and may include unintentional dictation errors.   [name], PA-C 09/21/22 1538    Carrie Mew, MD 09/21/22 2003

## 2023-06-24 ENCOUNTER — Encounter: Payer: Self-pay | Admitting: Emergency Medicine

## 2023-06-24 ENCOUNTER — Emergency Department
Admission: EM | Admit: 2023-06-24 | Discharge: 2023-06-24 | Disposition: A | Discharge status: Home / Self Care | Payer: Medicare Other | Attending: Emergency Medicine | Admitting: Emergency Medicine

## 2023-06-24 ENCOUNTER — Other Ambulatory Visit: Payer: Self-pay

## 2023-06-24 ENCOUNTER — Emergency Department: Payer: Medicare Other

## 2023-06-24 DIAGNOSIS — R0602 Shortness of breath: Secondary | ICD-10-CM

## 2023-06-24 DIAGNOSIS — F17200 Nicotine dependence, unspecified, uncomplicated: Secondary | ICD-10-CM | POA: Insufficient documentation

## 2023-06-24 LAB — CBC WITH DIFFERENTIAL/PLATELET
Abs Immature Granulocytes: 0.03 10*3/uL (ref 0.00–0.07)
Basophils Absolute: 0.1 10*3/uL (ref 0.0–0.1)
Basophils Relative: 1 %
Eosinophils Absolute: 0.2 10*3/uL (ref 0.0–0.5)
Eosinophils Relative: 2 %
HCT: 36.6 % — ABNORMAL LOW (ref 39.0–52.0)
Hemoglobin: 11.2 g/dL — ABNORMAL LOW (ref 13.0–17.0)
Immature Granulocytes: 0 %
Lymphocytes Relative: 39 %
Lymphs Abs: 3.5 10*3/uL (ref 0.7–4.0)
MCH: 28.6 pg (ref 26.0–34.0)
MCHC: 30.6 g/dL (ref 30.0–36.0)
MCV: 93.6 fL (ref 80.0–100.0)
Monocytes Absolute: 0.8 10*3/uL (ref 0.1–1.0)
Monocytes Relative: 9 %
Neutro Abs: 4.4 10*3/uL (ref 1.7–7.7)
Neutrophils Relative %: 49 %
Platelets: 488 10*3/uL — ABNORMAL HIGH (ref 150–400)
RBC: 3.91 MIL/uL — ABNORMAL LOW (ref 4.22–5.81)
RDW: 14.4 % (ref 11.5–15.5)
WBC: 9 10*3/uL (ref 4.0–10.5)
nRBC: 0 % (ref 0.0–0.2)

## 2023-06-24 LAB — BASIC METABOLIC PANEL WITH GFR
Anion gap: 7 (ref 5–15)
BUN: 11 mg/dL (ref 6–20)
CO2: 26 mmol/L (ref 22–32)
Calcium: 8.8 mg/dL — ABNORMAL LOW (ref 8.9–10.3)
Chloride: 102 mmol/L (ref 98–111)
Creatinine, Ser: 0.9 mg/dL (ref 0.61–1.24)
GFR, Estimated: >60 mL/min
Glucose, Bld: 150 mg/dL — ABNORMAL HIGH (ref 70–99)
Potassium: 3.7 mmol/L (ref 3.5–5.1)
Sodium: 135 mmol/L (ref 135–145)

## 2023-06-24 LAB — VALPROIC ACID LEVEL: Valproic Acid Lvl: <10 ug/mL — ABNORMAL LOW (ref 50.0–100.0)

## 2023-06-24 NOTE — ED Provider Notes (Signed)
[facility] Provider Note    Event Date/Time   First MD Initiated Contact with Patient 06/24/23 [phone]     (approximate)   History   Shortness of Breath   HPI Peter Wilson is a 47 y.o. male known to the emergency department for his history of schizophrenia.  He also has a history of polysubstance abuse.  He presents tonight for evaluation of shortness of breath.  The patient states that he was having a dream where he was running and then he woke up and he felt like he could not breathe or catch his breath and he was afraid he was going to die.  He was not sure if he was panicking because he could not breathe and that made him have a dream or if he had a dream and that made him not breathe.  He said he feels better now but he thinks he just freaked out a little bit.  He smokes but said he does not think he has COPD.  He has not had a recent fever.  He has no chest pain or abdominal pain.     Physical Exam   Triage Vital Signs: ED Triage Vitals [06/24/23 0512]  Enc Vitals Group     BP 114/74     Pulse Rate 93     Resp 18     Temp 98.4 F (36.9 C)     Temp Source Oral     SpO2 100 %     Weight 74.8 kg (165 lb)     Height 1.829 m (6')     Head Circumference      Peak Flow      Pain Score 0     Pain Loc      Pain Edu?      Excl. in GC?     Most recent vital signs: Vitals:   06/24/23 0512  BP: 114/74  Pulse: 93  Resp: 18  Temp: 98.4 F (36.9 C)  SpO2: 100%    General: Awake, no distress.  CV:  Good peripheral perfusion.  Regular rate and rhythm.  Normal heart sounds. Resp:  Normal effort. Speaking easily and comfortably, no accessory muscle usage nor intercostal retractions.  Lungs are clear to auscultation with no wheezes, rales, nor rhonchi. Abd:  No distention.  Other:  Patient has a bit of pressured speech and rapid speech, but he is otherwise well-appearing and acting appropriate.   ED Results / Procedures / Treatments   Labs (all  labs ordered are listed, but only abnormal results are displayed) Labs Reviewed  CBC WITH DIFFERENTIAL/PLATELET - Abnormal; Notable for the following components:      Result Value   RBC 3.91 (*)    Hemoglobin 11.2 (*)    HCT 36.6 (*)    Platelets 488 (*)    All other components within normal limits  BASIC METABOLIC PANEL - Abnormal; Notable for the following components:   Glucose, Bld 150 (*)    Calcium 8.8 (*)    All other components within normal limits  VALPROIC ACID LEVEL     EKG  ED ECG REPORT I, Loleta Rose, the attending physician, personally viewed and interpreted this ECG.  Date: 06/24/2023 EKG Time: 5:17 AM Rate: 73 Rhythm: normal sinus rhythm QRS Axis: normal Intervals: normal ST/T Wave abnormalities: Non-specific ST segment / T-wave changes, but no clear evidence of acute ischemia. Narrative Interpretation: no definitive evidence of acute ischemia; does not meet STEMI criteria.  RADIOLOGY I viewed and interpreted the patient's two-view chest x-ray.  No evidence of pneumonia or pulmonary edema.   PROCEDURES:  Critical Care performed: No  .1-3 Lead EKG Interpretation  Performed by: Loleta Rose, MD Authorized by: Loleta Rose, MD     Interpretation: normal     ECG rate:  90   ECG rate assessment: normal     Rhythm: sinus rhythm     Ectopy: none     Conduction: normal       IMPRESSION / MDM / ASSESSMENT AND PLAN / ED COURSE  I reviewed the triage vital signs and the nursing notes.                              Differential diagnosis includes, but is not limited to, panic attack, pneumonia, pulmonary edema, COPD, asthma.  Patient's presentation is most consistent with acute presentation with potential threat to life or bodily function.  Labs/studies ordered: EKG, two-view chest x-ray, BMP, CBC with differential  Interventions/Medications given:  Medications - No data to display  (Note:  hospital course my include additional  interventions and/or labs/studies not listed above.)   Patient is already feeling better and has normal vital signs.  100% SpO2 on room air.  Lungs are clear.  Chest x-ray and EKG are normal.  Labs are essentially normal.  I ordered a valproic acid level initially given his psychiatric history but it is likely not clinically important at this time.  The patient is on the cardiac monitor to evaluate for evidence of arrhythmia and/or significant heart rate changes.  I provided reassurance and the patient feels much better.  There is no indication he has an emergent or life-threatening illness.  He should be appropriate for discharge and outpatient follow-up as needed.  Patient feels better and agrees with the plan.     FINAL CLINICAL IMPRESSION(S) / ED DIAGNOSES   Final diagnoses:  Shortness of breath     Rx / DC Orders   ED Discharge Orders     None        Note:  This document was prepared using Dragon voice recognition software and may include unintentional dictation errors.   Loleta Rose, MD 06/24/23 [phone]

## 2023-06-24 NOTE — ED Triage Notes (Addendum)
Patient reports he woke up from his sleep with shob.  Patient states "I was having a bad dream and running in my sleep." Patient recently in inpatient behavioral health, 2 days ago, was tested for covid and it was negative.  Patient speaking with rapid speech, full sentences in triage.

## 2023-06-24 NOTE — Discharge Instructions (Signed)
As we discussed, it sounds like you are feeling short of breath in your dream and that likely led to a panic attack.  This is very understandable but your evaluation today was normal and there does not appear to be anything wrong with your lungs.  Please follow-up with your regular doctor and continue taking your regular medications.

## 2023-06-29 ENCOUNTER — Encounter (HOSPITAL_COMMUNITY): Payer: Self-pay

## 2023-06-29 ENCOUNTER — Other Ambulatory Visit: Payer: Self-pay

## 2023-06-29 ENCOUNTER — Ambulatory Visit (INDEPENDENT_AMBULATORY_CARE_PROVIDER_SITE_OTHER)
Admission: EM | Admit: 2023-06-29 | Discharge: 2023-06-30 | Disposition: A | Discharge status: Home / Self Care | Payer: Medicare Other | Source: Home / Self Care

## 2023-06-29 ENCOUNTER — Emergency Department (HOSPITAL_COMMUNITY)
Admission: EM | Admit: 2023-06-29 | Discharge: 2023-06-29 | Discharge status: Against Medical Advice | Payer: Medicare Other | Attending: Emergency Medicine | Admitting: Emergency Medicine

## 2023-06-29 DIAGNOSIS — F1210 Cannabis abuse, uncomplicated: Secondary | ICD-10-CM | POA: Insufficient documentation

## 2023-06-29 DIAGNOSIS — F209 Schizophrenia, unspecified: Secondary | ICD-10-CM

## 2023-06-29 DIAGNOSIS — Z79899 Other long term (current) drug therapy: Secondary | ICD-10-CM | POA: Insufficient documentation

## 2023-06-29 DIAGNOSIS — R45850 Homicidal ideations: Secondary | ICD-10-CM | POA: Insufficient documentation

## 2023-06-29 DIAGNOSIS — W010XXA Fall on same level from slipping, tripping and stumbling without subsequent striking against object, initial encounter: Secondary | ICD-10-CM | POA: Diagnosis not present

## 2023-06-29 DIAGNOSIS — M549 Dorsalgia, unspecified: Secondary | ICD-10-CM | POA: Diagnosis not present

## 2023-06-29 DIAGNOSIS — F200 Paranoid schizophrenia: Secondary | ICD-10-CM | POA: Insufficient documentation

## 2023-06-29 DIAGNOSIS — Z5321 Procedure and treatment not carried out due to patient leaving prior to being seen by health care provider: Secondary | ICD-10-CM | POA: Diagnosis not present

## 2023-06-29 DIAGNOSIS — D509 Iron deficiency anemia, unspecified: Secondary | ICD-10-CM | POA: Diagnosis not present

## 2023-06-29 LAB — COMPREHENSIVE METABOLIC PANEL
ALT: 15 U/L (ref 0–44)
AST: 29 U/L (ref 15–41)
Albumin: 3.4 g/dL — ABNORMAL LOW (ref 3.5–5.0)
Alkaline Phosphatase: 47 U/L (ref 38–126)
Anion gap: 8 (ref 5–15)
BUN: 10 mg/dL (ref 6–20)
CO2: 29 mmol/L (ref 22–32)
Calcium: 9.1 mg/dL (ref 8.9–10.3)
Chloride: 105 mmol/L (ref 98–111)
Creatinine, Ser: 0.81 mg/dL (ref 0.61–1.24)
GFR, Estimated: >60 mL/min (ref >60)
Glucose, Bld: 98 mg/dL (ref 70–99)
Potassium: 4.1 mmol/L (ref 3.5–5.1)
Sodium: 142 mmol/L (ref 135–145)
Total Bilirubin: 0.6 mg/dL (ref 0.3–1.2)
Total Protein: 6.5 g/dL (ref 6.5–8.1)

## 2023-06-29 LAB — CBC WITH DIFFERENTIAL/PLATELET
Abs Immature Granulocytes: 0.01 10*3/uL (ref 0.00–0.07)
Basophils Absolute: 0.1 10*3/uL (ref 0.0–0.1)
Basophils Relative: 1 %
Eosinophils Absolute: 0.1 10*3/uL (ref 0.0–0.5)
Eosinophils Relative: 2 %
HCT: 31.5 % — ABNORMAL LOW (ref 39.0–52.0)
Hemoglobin: 10.2 g/dL — ABNORMAL LOW (ref 13.0–17.0)
Immature Granulocytes: 0 %
Lymphocytes Relative: 53 %
Lymphs Abs: 2.9 10*3/uL (ref 0.7–4.0)
MCH: 30.4 pg (ref 26.0–34.0)
MCHC: 32.4 g/dL (ref 30.0–36.0)
MCV: 93.8 fL (ref 80.0–100.0)
Monocytes Absolute: 0.5 10*3/uL (ref 0.1–1.0)
Monocytes Relative: 9 %
Neutro Abs: 1.9 10*3/uL (ref 1.7–7.7)
Neutrophils Relative %: 35 %
Platelets: 469 10*3/uL — ABNORMAL HIGH (ref 150–400)
RBC: 3.36 MIL/uL — ABNORMAL LOW (ref 4.22–5.81)
RDW: 14.9 % (ref 11.5–15.5)
WBC: 5.4 10*3/uL (ref 4.0–10.5)
nRBC: 0 % (ref 0.0–0.2)

## 2023-06-29 LAB — POCT URINE DRUG SCREEN - MANUAL ENTRY (I-SCREEN)
POC Amphetamine UR: NOT DETECTED
POC Buprenorphine (BUP): NOT DETECTED
POC Cocaine UR: NOT DETECTED
POC Marijuana UR: POSITIVE — AB
POC Methadone UR: NOT DETECTED
POC Methamphetamine UR: NOT DETECTED
POC Morphine: NOT DETECTED
POC Oxazepam (BZO): NOT DETECTED
POC Oxycodone UR: NOT DETECTED
POC Secobarbital (BAR): NOT DETECTED

## 2023-06-29 LAB — LIPID PANEL
Cholesterol: 164 mg/dL (ref 0–200)
HDL: 51 mg/dL (ref >40)
LDL Cholesterol: 98 mg/dL (ref 0–99)
Total CHOL/HDL Ratio: 3.2 RATIO
Triglycerides: 75 mg/dL (ref <150)
VLDL: 15 mg/dL (ref 0–40)

## 2023-06-29 LAB — TSH: TSH: 2.163 u[IU]/mL (ref 0.350–4.500)

## 2023-06-29 LAB — HEMOGLOBIN A1C
Hgb A1c MFr Bld: 4.3 % — ABNORMAL LOW (ref 4.8–5.6)
Mean Plasma Glucose: 76.71 mg/dL

## 2023-06-29 LAB — ETHANOL: Alcohol, Ethyl (B): <10 mg/dL (ref <10)

## 2023-06-29 MED ORDER — MAGNESIUM HYDROXIDE 400 MG/5ML PO SUSP: 30.0000 mL | Freq: Every day | ORAL | Status: DC | PRN

## 2023-06-29 MED ORDER — HYDROXYZINE HCL 25 MG PO TABS
25.0000 mg | ORAL_TABLET | Freq: Three times a day (TID) | ORAL | Status: DC | PRN
  Administered 2023-06-29: 25 mg via ORAL
  Filled 2023-06-29: qty 1

## 2023-06-29 MED ORDER — TRAZODONE HCL 50 MG PO TABS
50.0000 mg | ORAL_TABLET | Freq: Every evening | ORAL | Status: DC | PRN
  Administered 2023-06-29: 50 mg via ORAL
  Filled 2023-06-29: qty 1

## 2023-06-29 MED ORDER — ACETAMINOPHEN 325 MG PO TABS
650.0000 mg | ORAL_TABLET | Freq: Four times a day (QID) | ORAL | Status: DC | PRN
  Administered 2023-06-29: 650 mg via ORAL
  Filled 2023-06-29: qty 2

## 2023-06-29 MED ORDER — ALUM & MAG HYDROXIDE-SIMETH 200-200-20 MG/5ML PO SUSP: 30.0000 mL | ORAL | Status: DC | PRN

## 2023-06-29 NOTE — ED Triage Notes (Signed)
Patient reports tripping and falling 30 mins PTA. Patient reports back pain. Denies hitting head and LOC.

## 2023-06-29 NOTE — ED Notes (Signed)
Pt is A & O x 4. Pt is oriented to the unit and provided with meal. Medication has been administered to him and pt is lying on his bed. Pt denies SI/HI/AVH. Will continue to monitor.Pt is A & O x 4. Pt is oriented to the unit and provided with meal. Medication has been administered to him and pt is lying on his bed. Pt denies SI/HI/AVH. Will continue to monitor for safety. Pt reports pain and tylenol 650mg  administered to that effect.

## 2023-06-29 NOTE — ED Notes (Signed)
Patient is sleeping. Respirations equal and unlabored, skin warm and dry. No change in assessment or acuity. Routine safety checks conducted according to facility protocol. Will continue to monitor for safety.   

## 2023-06-29 NOTE — Progress Notes (Signed)
   06/29/23 1900  [facility] Triage Screening (Walk-ins at [facility] only)  How Did You Hear About [place]? Self  What Is the Reason for Your Visit/Call Today? Pt has a diagnosis of schizophrenia. He states he initially presented to [facility] and was transported to [facility] voluntarily via Patent examiner. He says he was discharged from [facility] two weeks ago and has not taken psychiatric medications since discharge. He reports feeling angry and has punched walls. Pt's medical record indicates a history of aggressive behavior. He acknowledges feeling paranoid. 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 He does not know this individual's name but believe he has cheated him out of $300,000. He denies current suicidal ideation. He denies auditory or visual hallucinations. He reports smoking marijuana daily and denies use of alcohol or other substances.  How Long Has This Been Causing You Problems? 1 wk - 1 month  Have You Recently Had Any Thoughts About Hurting Yourself? No  Are You Planning to Commit Suicide/Harm Yourself At This time? No  Have you Recently Had Thoughts About Hurting Someone [name]? Yes  How long ago did you have thoughts of harming others? Current thoughts of harming the distributor of his record label  Are You Planning To Harm Someone At This Time? No  Are you currently experiencing any auditory, visual or other hallucinations? No  Have You Used Any Alcohol or Drugs in the Past 24 Hours? Yes  How long ago did you use Drugs or Alcohol? Marijuana  What Did You Use and How Much? Smoked 0.5 ounces of marijuana today  Do you have any current medical co-morbidities that require immediate attention? No  Clinician description of patient physical appearance/behavior: Pt is casually dressed, alert and oriented x4. Pt speaks in a clear tone, at moderate volume and normal pace. Motor behavior appears normal. Eye contact is staring. Pt's mood is anxious and irritable and affect  is congruent with mood. Thought process is coherent. There is no indication Pt is currently responding to internal stimuli.  What Do You Feel Would Help You the Most Today? Treatment for Depression or other mood problem;Medication(s)  If access to [facility] was not available, would you have sought care in the Emergency Department? Yes  Determination of Need Urgent (48 hours)  Options For Referral [facility];Inpatient Hospitalization;Outpatient Therapy;Medication Management

## 2023-06-29 NOTE — ED Provider Notes (Incomplete)
[facility] Continuous Assessment Admission H&P  Date: 06/30/23 Patient Name: Peter Wilson MRN: [id number] Chief Complaint: "there are conversations in my brain and I cannot turn it off."  Diagnoses:  Final diagnoses:  Schizophrenia, unspecified type (HCC)  Cannabis abuse    HPI: Peter Wilson is a 47 year old male with psychiatric history of schizophrenia, aggressive behavior, and polysubstance abuse.  Patient was brought to [facility] voluntarily by law enforcement requesting medication management, auditory hallucination, and reporting homicidal ideation.   Patient was evaluated face-to-face and his chart was reviewed by this nurse practitioner.  On evaluation, patient reports increased auditory hallucination.  He repeatedly states "there are conversations in my brain and I cannot turn it off." He says he was discharged from [facility] about 2 weeks ago; he says he is prescribed Haldol, cogentin, and Klonopin (he is unsure of the strength of his medications) but has not taken any of his any of psychotropics since discharge.  He reports feeling anxious and irritable and identified his mental health as a main stressor. He denies suicidal ideation. 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  He reports that he is unsure of the physical location of this person at this time.  He says that he is unable to contract for safety due to increased irritability and anger.  He continued to voice that he has thoughts about wanting to hurt other people and states "the way I'm feeling and this conversation in my brain, I will smack someone if they look at me wrong."  He denies visual hallucination, and paranoia.  Patient admits to smoking 0.5 gram to 1 ounce of marijuana daily.   Patient is alert and oriented x 4; he is irritable but cooperative with assessment.  Patient's speech is clear and  coherent.  He has good eye contact.  Patient's mood is irritable and anxious, his affect is congruent.  Patient's thought process is coherent.  He did not appear to be responding to any internal/external stimuli during interaction.  Total Time spent with patient: 30 minutes  Musculoskeletal  Strength & Muscle Tone: within normal limits Gait & Station: normal Patient leans: Right  Psychiatric Specialty Exam  Presentation General Appearance:  Appropriate for Environment  Eye Contact: Good  Speech: Clear and Coherent  Speech Volume: Increased  Handedness: Right   Mood and Affect  Mood: Anxious; Irritable  Affect: Congruent; Appropriate   Thought Process  Thought Processes: Linear  Descriptions of Associations:Intact  Orientation:Full (Time, Place and Person)  Thought Content:Paranoid Ideation  Diagnosis of Schizophrenia or Schizoaffective disorder in past: Yes  Duration of Psychotic Symptoms: Greater than six months  Hallucinations:Hallucinations: Auditory Description of Auditory Hallucinations: "Voices that are having conservations"  Ideas of Reference:Paranoia; Delusions  Suicidal Thoughts:Suicidal Thoughts: No  Homicidal Thoughts:Homicidal Thoughts: No   Sensorium  Memory: Immediate Fair; Recent Poor; Remote Poor  Judgment: Poor  Insight: Fair   Chartered certified accountant: Fair  Attention Span: Fair  Recall: Fair  Fund of Knowledge: Fair  Language: Fair   Psychomotor Activity  Psychomotor Activity: Psychomotor Activity: Restlessness   Assets  Assets: Desire for Improvement; Housing; Physical Health   Sleep  Sleep: Sleep: Fair Number of Hours of Sleep: 6   Nutritional Assessment (For OBS and FBC admissions only) Has the patient had a weight loss or gain of 10 pounds or more in the last 3 months?: No Has the patient  had a decrease in food intake/or appetite?: No Does the patient have dental problems?:  No Does the patient have eating habits or behaviors that may be indicators of an eating disorder including binging or inducing vomiting?: No Has the patient recently lost weight without trying?: 0 Has the patient been eating poorly because of a decreased appetite?: 0 Malnutrition Screening Tool Score: 0    Physical Exam Vitals and nursing note reviewed.  Constitutional:      General: He is not in acute distress.    Appearance: He is well-developed.  HENT:     Head: Normocephalic and atraumatic.  Cardiovascular:     Rate and Rhythm: Bradycardia present.  Pulmonary:     Effort: No respiratory distress.  Abdominal:     Palpations: Abdomen is soft.     Tenderness: There is no abdominal tenderness.  Musculoskeletal:        General: Normal range of motion.  Skin:    Coloration: Skin is not jaundiced.  Neurological:     Mental Status: He is alert and oriented to person, place, and time.  Psychiatric:        Attention and Perception: Attention and perception normal.        Mood and Affect: Mood is anxious. Affect is angry.        Speech: Speech normal.        Behavior: Behavior normal. Behavior is cooperative.        Thought Content: Thought content includes homicidal ideation.    Review of Systems  Constitutional: Negative.   HENT: Negative.    Eyes: Negative.   Respiratory: Negative.    Cardiovascular: Negative.   Gastrointestinal: Negative.   Genitourinary: Negative.   Musculoskeletal: Negative.   Skin: Negative.   Neurological: Negative.   Endo/Heme/Allergies: Negative.   Psychiatric/Behavioral:  Positive for substance abuse. The patient is nervous/anxious.     Blood pressure 109/64, pulse (!) 52, temperature 97.9 F (36.6 C), temperature source Oral, resp. rate 18, SpO2 100 %. There is no height or weight on file to calculate BMI.  Past Psychiatric History:  Past Medical History:  Diagnosis Date   Marijuana abuse    Schizophrenia, paranoid (HCC)    Tardive  dyskinesia    Tobacco abuse       Is the patient at risk to self? No  Has the patient been a risk to self in the past 6 months? No .    Has the patient been a risk to self within the distant past? Yes   Is the patient a risk to others? Yes   Has the patient been a risk to others in the past 6 months? No   Has the patient been a risk to others within the distant past? Yes   Past Medical History: alcohol abuse,   Family History: none reported   Social History:  Social History   Tobacco Use   Smoking status: Every Day    Packs/day: 2    Types: Cigarettes   Smokeless tobacco: Never   Tobacco comments:    Unknown   Substance Use Topics   Alcohol use: Yes    Alcohol/week: 0.0 standard drinks of alcohol    Comment: Unknown    Drug use: Yes    Frequency: 7.0 times per week    Types: Marijuana, Cocaine    Comment: Unknown      Last Labs:  Admission on 06/29/2023  Component Date Value Ref Range Status   WBC 06/29/2023 5.4  4.0 - 10.5 K/uL Final   RBC 06/29/2023 3.36 (L)  4.22 - 5.81 MIL/uL Final   Hemoglobin 06/29/2023 10.2 (L)  13.0 - 17.0 g/dL Final   HCT 16/09/9603 31.5 (L)  39.0 - 52.0 % Final   MCV 06/29/2023 93.8  80.0 - 100.0 fL Final   MCH 06/29/2023 30.4  26.0 - 34.0 pg Final   MCHC 06/29/2023 32.4  30.0 - 36.0 g/dL Final   RDW 54/08/8118 14.9  11.5 - 15.5 % Final   Platelets 06/29/2023 469 (H)  150 - 400 K/uL Final   nRBC 06/29/2023 0.0  0.0 - 0.2 % Final   Neutrophils Relative % 06/29/2023 35  % Final   Neutro Abs 06/29/2023 1.9  1.7 - 7.7 K/uL Final   Lymphocytes Relative 06/29/2023 53  % Final   Lymphs Abs 06/29/2023 2.9  0.7 - 4.0 K/uL Final   Monocytes Relative 06/29/2023 9  % Final   Monocytes Absolute 06/29/2023 0.5  0.1 - 1.0 K/uL Final   Eosinophils Relative 06/29/2023 2  % Final   Eosinophils Absolute 06/29/2023 0.1  0.0 - 0.5 K/uL Final   Basophils Relative 06/29/2023 1  % Final   Basophils Absolute 06/29/2023 0.1  0.0 - 0.1 K/uL Final    Immature Granulocytes 06/29/2023 0  % Final   Abs Immature Granulocytes 06/29/2023 0.01  0.00 - 0.07 K/uL Final   Performed at [facility], [address], [place], [place] [address]   Sodium 06/29/2023 142  135 - 145 mmol/L Final   Potassium 06/29/2023 4.1  3.5 - 5.1 mmol/L Final   Chloride 06/29/2023 105  98 - 111 mmol/L Final   CO2 06/29/2023 29  22 - 32 mmol/L Final   Glucose, Bld 06/29/2023 98  70 - 99 mg/dL Final   Glucose reference range applies only to samples taken after fasting for at least 8 hours.   BUN 06/29/2023 10  6 - 20 mg/dL Final   Creatinine, Ser 06/29/2023 0.81  0.61 - 1.24 mg/dL Final   Calcium 95/62/1308 9.1  8.9 - 10.3 mg/dL Final   Total Protein 65/78/4696 6.5  6.5 - 8.1 g/dL Final   Albumin 29/52/8413 3.4 (L)  3.5 - 5.0 g/dL Final   AST 24/40/1027 29  15 - 41 U/L Final   ALT 06/29/2023 15  0 - 44 U/L Final   Alkaline Phosphatase 06/29/2023 47  38 - 126 U/L Final   Total Bilirubin 06/29/2023 0.6  0.3 - 1.2 mg/dL Final   GFR, Estimated 06/29/2023 >60  >60 mL/min Final   Comment: (NOTE) Calculated using the CKD-EPI Creatinine Equation (2021)    Anion gap 06/29/2023 8  5 - 15 Final   Performed at [facility], [address] [place], [place] [address]   Hgb A1c MFr Bld 06/29/2023 4.3 (L)  4.8 - 5.6 % Final   Comment: (NOTE) Pre diabetes:          5.7%-6.4%  Diabetes:              >6.4%  Glycemic control for   <7.0% adults with diabetes    Mean Plasma Glucose 06/29/2023 76.71  mg/dL Final   Performed at [facility], [address], [place], [place] [address]   Alcohol, Ethyl (B) 06/29/2023 <10  <10 mg/dL Final   Comment: (NOTE) Lowest detectable limit for serum alcohol is 10 mg/dL.  For medical purposes only. Performed at [facility], [address] [place],  [place] [address]    POC Amphetamine UR 06/29/2023 None Detected  NONE DETECTED (Cut Off Level 1000 ng/mL) Final   POC Secobarbital (BAR) 06/29/2023 None Detected  NONE  DETECTED (Cut Off Level 300 ng/mL) Final   POC Buprenorphine (BUP) 06/29/2023 None Detected  NONE DETECTED (Cut Off Level 10 ng/mL) Final   POC Oxazepam (BZO) 06/29/2023 None Detected  NONE DETECTED (Cut Off Level 300 ng/mL) Final   POC Cocaine UR 06/29/2023 None Detected  NONE DETECTED (Cut Off Level 300 ng/mL) Final   POC Methamphetamine UR 06/29/2023 None Detected  NONE DETECTED (Cut Off Level 1000 ng/mL) Final   POC Morphine 06/29/2023 None Detected  NONE DETECTED (Cut Off Level 300 ng/mL) Final   POC Methadone UR 06/29/2023 None Detected  NONE DETECTED (Cut Off Level 300 ng/mL) Final   POC Oxycodone UR 06/29/2023 None Detected  NONE DETECTED (Cut Off Level 100 ng/mL) Final   POC Marijuana UR 06/29/2023 Positive (A)  NONE DETECTED (Cut Off Level 50 ng/mL) Final   Cholesterol 06/29/2023 164  0 - 200 mg/dL Final   Triglycerides 19/14/7829 75  <150 mg/dL Final   HDL 56/21/3086 51  >40 mg/dL Final   Total CHOL/HDL Ratio 06/29/2023 3.2  RATIO Final   VLDL 06/29/2023 15  0 - 40 mg/dL Final   LDL Cholesterol 06/29/2023 98  0 - 99 mg/dL Final   Comment:        Total Cholesterol/HDL:CHD Risk Coronary Heart Disease Risk Table                     Men   Women  1/2 Average Risk   3.4   3.3  Average Risk       5.0   4.4  2 X Average Risk   9.6   7.1  3 X Average Risk  23.4   11.0        Use the calculated Patient Ratio above and the CHD Risk Table to determine the patient's CHD Risk.        ATP III CLASSIFICATION (LDL):  <100     mg/dL   Optimal  578-469  mg/dL   Near or Above                    Optimal  130-159  mg/dL   Borderline  629-528  mg/dL   High  >413     mg/dL   Very High Performed at [facility], [address], [place], [place] [address]    TSH 06/29/2023 2.163  0.350 - 4.500 uIU/mL Final   Comment: Performed by a 3rd Generation assay with a functional sensitivity of <=0.01 uIU/mL. Performed at [facility], [address] [place], [place] [address]    Admission on 06/24/2023, Discharged on 06/24/2023  Component Date Value Ref Range Status   WBC 06/24/2023 9.0  4.0 - 10.5 K/uL Final   RBC 06/24/2023 3.91 (L)  4.22 - 5.81 MIL/uL Final   Hemoglobin 06/24/2023 11.2 (L)  13.0 - 17.0 g/dL Final   HCT 36/64/4034 36.6 (L)  39.0 - 52.0 % Final   MCV 06/24/2023 93.6  80.0 - 100.0 fL Final   MCH 06/24/2023 28.6  26.0 - 34.0 pg Final   MCHC 06/24/2023 30.6  30.0 - 36.0 g/dL Final   RDW 74/25/9563 14.4  11.5 - 15.5 % Final   Platelets 06/24/2023 488 (H)  150 - 400 K/uL Final   nRBC 06/24/2023 0.0  0.0 -  0.2 % Final   Neutrophils Relative % 06/24/2023 49  % Final   Neutro Abs 06/24/2023 4.4  1.7 - 7.7 K/uL Final   Lymphocytes Relative 06/24/2023 39  % Final   Lymphs Abs 06/24/2023 3.5  0.7 - 4.0 K/uL Final   Monocytes Relative 06/24/2023 9  % Final   Monocytes Absolute 06/24/2023 0.8  0.1 - 1.0 K/uL Final   Eosinophils Relative 06/24/2023 2  % Final   Eosinophils Absolute 06/24/2023 0.2  0.0 - 0.5 K/uL Final   Basophils Relative 06/24/2023 1  % Final   Basophils Absolute 06/24/2023 0.1  0.0 - 0.1 K/uL Final   Immature Granulocytes 06/24/2023 0  % Final   Abs Immature Granulocytes 06/24/2023 0.03  0.00 - 0.07 K/uL Final   Performed at [facility], [address] [place], [place] [address]   Sodium 06/24/2023 135  135 - 145 mmol/L Final   Potassium 06/24/2023 3.7  3.5 - 5.1 mmol/L Final   Chloride 06/24/2023 102  98 - 111 mmol/L Final   CO2 06/24/2023 26  22 - 32 mmol/L Final   Glucose, Bld 06/24/2023 150 (H)  70 - 99 mg/dL Final   Glucose reference range applies only to samples taken after fasting for at least 8 hours.   BUN 06/24/2023 11  6 - 20 mg/dL Final   Creatinine, Ser 06/24/2023 0.90  0.61 - 1.24 mg/dL Final   Calcium 60/63/0160 8.8 (L)  8.9 - 10.3 mg/dL Final   GFR, Estimated 06/24/2023 >60  >60 mL/min Final   Comment: (NOTE) Calculated using the CKD-EPI Creatinine Equation (2021)    Anion gap 06/24/2023 7  5 - 15  Final   Performed at [facility], [address] [place], [place] [address]   Valproic Acid Lvl 06/24/2023 <10 (L)  50.0 - 100.0 ug/mL Final   Performed at [facility], [address] [place], [place] [address]    Allergies: Chlorpromazine, Morphine, Ibuprofen, and Penicillins  Medications:  Facility Ordered Medications  Medication   acetaminophen (TYLENOL) tablet 650 mg   alum & mag hydroxide-simeth (MAALOX/MYLANTA) 200-200-20 MG/5ML suspension 30 mL   magnesium hydroxide (MILK OF MAGNESIA) suspension 30 mL   traZODone (DESYREL) tablet 50 mg   hydrOXYzine (ATARAX) tablet 25 mg   [START ON 07/02/2023] ascorbic acid (VITAMIN C) tablet 500 mg   ferrous sulfate tablet 325 mg   gabapentin (NEURONTIN) capsule 300 mg   haloperidol (HALDOL) tablet 10 mg   benztropine (COGENTIN) tablet 1 mg   PTA Medications  Medication Sig   haloperidol (HALDOL) 10 MG tablet Take 10 mg by mouth 2 (two) times daily.   gabapentin (NEURONTIN) 300 MG capsule Take 300 mg by mouth 3 (three) times daily.   ferrous sulfate 325 (65 FE) MG tablet Take 325 mg by mouth daily with breakfast.   ascorbic acid (VITAMIN C) 500 MG tablet Take 500 mg by mouth 3 (three) times a week.   prazosin (MINIPRESS) 1 MG capsule Take 1 mg by mouth at bedtime.   hydrOXYzine (ATARAX) 25 MG tablet Take 25 mg by mouth every 8 (eight) hours as needed. (Patient not taking: Reported on 09/15/2022)   benztropine (COGENTIN) 1 MG tablet Take 1 tablet (1 mg total) by mouth 2 (two) times daily.   clonazePAM (KLONOPIN) 1 MG tablet Take 1 tablet (1 mg total) by mouth 2 (two) times daily.   divalproex (DEPAKOTE) 500 MG DR tablet Take 1 tablet (500 mg total) by mouth at bedtime.  haloperidol decanoate (HALDOL DECANOATE) 100 MG/ML injection Inject 100 mg into the muscle every 28 (twenty-eight) days.      Medical Decision Making  Patient will be admitted to [facility] for continuous assessment with follow-up by psychiatry.       Recommendations  Based on my evaluation the patient does not appear to have an emergency medical condition.  Maricela Bo, NP 06/30/23  7:15 AM

## 2023-06-29 NOTE — ED Notes (Signed)
Pt stating he is leaving because he needs snacks.
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
## 2023-06-30 ENCOUNTER — Encounter (HOSPITAL_COMMUNITY): Payer: Self-pay | Admitting: Emergency Medicine

## 2023-06-30 MED ORDER — FERROUS SULFATE 325 (65 FE) MG PO TBEC: 325.0000 mg | DELAYED_RELEASE_TABLET | Freq: Every day | ORAL | 0 refills | Status: AC

## 2023-06-30 MED ORDER — ARIPIPRAZOLE 30 MG PO TABS: 30.0000 mg | ORAL_TABLET | Freq: Every day | ORAL | 0 refills | Status: AC

## 2023-06-30 MED ORDER — OLANZAPINE 10 MG PO TABS: 10.0000 mg | ORAL_TABLET | Freq: Every day | ORAL | 0 refills | Status: AC

## 2023-06-30 MED ORDER — DIVALPROEX SODIUM ER 500 MG PO TB24: 500.0000 mg | ORAL_TABLET | Freq: Two times a day (BID) | ORAL | 0 refills | Status: AC

## 2023-06-30 MED ORDER — HYDROXYZINE HCL 25 MG PO TABS: 25.0000 mg | ORAL_TABLET | Freq: Three times a day (TID) | ORAL | 0 refills | Status: AC | PRN

## 2023-06-30 MED ORDER — BENZTROPINE MESYLATE 1 MG PO TABS: 1.0000 mg | ORAL_TABLET | Freq: Two times a day (BID) | ORAL | 0 refills | Status: AC

## 2023-06-30 MED ORDER — PRAZOSIN HCL 1 MG PO CAPS: 1.0000 mg | ORAL_CAPSULE | Freq: Every day | ORAL | 0 refills | Status: AC

## 2023-06-30 MED ORDER — VITAMIN C 500 MG PO TABS: 500.0000 mg | ORAL_TABLET | ORAL | Status: DC

## 2023-06-30 MED ORDER — BENZTROPINE MESYLATE 1 MG PO TABS
1.0000 mg | ORAL_TABLET | Freq: Two times a day (BID) | ORAL | Status: DC
  Administered 2023-06-30: 1 mg via ORAL
  Filled 2023-06-30: qty 1

## 2023-06-30 MED ORDER — HALOPERIDOL 5 MG PO TABS
10.0000 mg | ORAL_TABLET | Freq: Two times a day (BID) | ORAL | Status: DC
  Administered 2023-06-30: 10 mg via ORAL
  Filled 2023-06-30: qty 2

## 2023-06-30 MED ORDER — GABAPENTIN 300 MG PO CAPS
300.0000 mg | ORAL_CAPSULE | Freq: Three times a day (TID) | ORAL | Status: DC
  Administered 2023-06-30: 300 mg via ORAL
  Filled 2023-06-30: qty 1

## 2023-06-30 MED ORDER — TRAZODONE HCL 50 MG PO TABS: 50.0000 mg | ORAL_TABLET | Freq: Every day | ORAL | 0 refills | Status: AC

## 2023-06-30 MED ORDER — FERROUS SULFATE 325 (65 FE) MG PO TABS
325.0000 mg | ORAL_TABLET | Freq: Every day | ORAL | Status: DC
  Administered 2023-06-30: 325 mg via ORAL
  Filled 2023-06-30: qty 1

## 2023-06-30 NOTE — Discharge Instructions (Addendum)
Return to [facility]

## 2023-06-30 NOTE — ED Provider Notes (Signed)
FBC/OBS ASAP Discharge Summary  Date and Time: 06/30/2023 9:21 AM  Name: Peter Wilson  MRN:  [id number]   Discharge Diagnoses:  Final diagnoses:  Schizophrenia, unspecified type (HCC)  Cannabis abuse  Iron deficiency anemia, unspecified iron deficiency anemia type   Subjective: "I came here on my own, needed my medicine to help me sleep"  Stay Summary: Peter Wilson is a 47 year old male, with history of schizophrenia, presented to [facility] overnight, voluntarily, reporting he was hearing voices, homicidal ideations, and and requesting medication management. Patient was transported by law enforcement at his request here to [facility]. Patient eloped from [facility] ED prior to presenting here to [facility].   See Admission encounter note for HPI details.  Patient has an extensive history of presenting to multiple ER in [facility] and most recently here within the [facility]. Patient is currently residing here in [place] with a friend. Patient reports being discharge from a mental health facility two weeks ago and receive his last Haldol Decanoate injection 100 mg prior to discharge. Reports to this writer that he had not been sleeping well and presented here to [facility] to receive medication management.   Per pharmacy refill report: Patient most recent psychiatric medications refills within 30 days include: Haldol Decanoate 100 mg Q28 06/23/23 Depakote 500 mg ER 06/08/23 Gabapentin 300 mg TID 06/21/23 Hydroxyzine 25 mg TID PRN 06/21/23 Olanzapine 10 mg at bedtime  06/08/23 Prazosin 1 mg at bedtime Trazodone 50 mg at bedtime   Patient home medications restarted by admitting provider and oral Haldol 10 mg BID initiated to stabilize symptoms on admission of hallucinations. On reevaluation today, patient is requesting to be discharged. He denies hallucinations, SI, or HI. He has not exhibited any aggressive or agitated behaviors. He plans to return home where he resides with his friend.  Patient will be given information to establish mental health care here at [facility]. Patient is able to contract for safety will be discharged today.   Total Time spent with patient: 45 minutes  Past Psychiatric History: See HPI Past Medical History: Non Contributory  Family History: History reviewed. No pertinent family history. Family Psychiatric History: History reviewed. No pertinent family history.  Social History: Smokes cannabis daily. Receives disability benefits. Resides with a friend. Tobacco Cessation:  A prescription for an FDA-approved tobacco cessation medication was offered at discharge and the patient refused  Current Medications:  No current facility-administered medications for this encounter.   Current Outpatient Medications  Medication Sig Dispense Refill   ferrous sulfate 325 (65 FE) MG EC tablet Take 1 tablet (325 mg total) by mouth daily with breakfast. 90 tablet 0   gabapentin (NEURONTIN) 300 MG capsule Take 300 mg by mouth 3 (three) times daily.     ARIPiprazole (ABILIFY) 30 MG tablet Take 1 tablet (30 mg total) by mouth daily. 15 tablet 0   benztropine (COGENTIN) 1 MG tablet Take 1 tablet (1 mg total) by mouth 2 (two) times daily. 15 tablet 0   divalproex (DEPAKOTE ER) 500 MG 24 hr tablet Take 1 tablet (500 mg total) by mouth 2 (two) times daily. 30 tablet 0   haloperidol decanoate (HALDOL DECANOATE) 100 MG/ML injection Inject 100 mg into the muscle every 28 (twenty-eight) days.     hydrOXYzine (ATARAX) 25 MG tablet Take 1 tablet (25 mg total) by mouth every 8 (eight) hours as needed. 30 tablet 0   OLANZapine (ZYPREXA) 10 MG tablet Take 1 tablet (10 mg total)  by mouth at bedtime. 15 tablet 0   prazosin (MINIPRESS) 1 MG capsule Take 1 capsule (1 mg total) by mouth at bedtime. 15 capsule 0   traZODone (DESYREL) 50 MG tablet Take 1 tablet (50 mg total) by mouth at bedtime. 15 tablet 0    PTA Medications:  PTA Medications  Medication Sig   gabapentin (NEURONTIN) 300  MG capsule Take 300 mg by mouth 3 (three) times daily.   ferrous sulfate 325 (65 FE) MG EC tablet Take 1 tablet (325 mg total) by mouth daily with breakfast.   haloperidol decanoate (HALDOL DECANOATE) 100 MG/ML injection Inject 100 mg into the muscle every 28 (twenty-eight) days.   ARIPiprazole (ABILIFY) 30 MG tablet Take 1 tablet (30 mg total) by mouth daily.   benztropine (COGENTIN) 1 MG tablet Take 1 tablet (1 mg total) by mouth 2 (two) times daily.   divalproex (DEPAKOTE ER) 500 MG 24 hr tablet Take 1 tablet (500 mg total) by mouth 2 (two) times daily.   OLANZapine (ZYPREXA) 10 MG tablet Take 1 tablet (10 mg total) by mouth at bedtime.   prazosin (MINIPRESS) 1 MG capsule Take 1 capsule (1 mg total) by mouth at bedtime.   traZODone (DESYREL) 50 MG tablet Take 1 tablet (50 mg total) by mouth at bedtime.   hydrOXYzine (ATARAX) 25 MG tablet Take 1 tablet (25 mg total) by mouth every 8 (eight) hours as needed.       06/13/2020   10:19 PM  Depression screen PHQ 2/9  Decreased Interest 3  Down, Depressed, Hopeless 3  PHQ - 2 Score 6  Altered sleeping 3  Tired, decreased energy 3  Change in appetite 0  Feeling bad or failure about yourself  1  Trouble concentrating 3  Moving slowly or fidgety/restless 2  Suicidal thoughts 3  PHQ-9 Score 21  Difficult doing work/chores Extremely dIfficult    Flowsheet Row ED from 06/29/2023 in [facility] Most recent reading at 06/29/2023  9:53 PM ED from 06/29/2023 in [facility] Emergency Department at [facility] Most recent reading at 06/29/2023  5:25 PM ED from 06/24/2023 in [facility] Emergency Department at [facility] Most recent reading at 06/24/2023  5:13 AM  C-SSRS RISK CATEGORY No Risk No Risk No Risk       Musculoskeletal  Strength & Muscle Tone: within normal limits Gait & Station: normal Patient leans: N/A  Psychiatric Specialty Exam  Presentation  General Appearance:  Appropriate for  Environment  Eye Contact: Good  Speech: Clear and Coherent  Speech Volume: Normal  Handedness: Right   Mood and Affect  Mood: Euthymic  Affect: Appropriate   Thought Process  Thought Processes: Coherent; Linear  Descriptions of Associations:Intact  Orientation:Full (Time, Place and Person)  Thought Content:WDL  Diagnosis of Schizophrenia or Schizoaffective disorder in past: Yes  Duration of Psychotic Symptoms: Greater than six months   Hallucinations:Hallucinations: None Description of Auditory Hallucinations: "Voices that are having conservations"  Ideas of Reference:None  Suicidal Thoughts:Suicidal Thoughts: No  Homicidal Thoughts:Homicidal Thoughts: No   Sensorium  Memory: Immediate Good; Recent Good; Remote Good  Judgment: Fair  Insight: Fair   Chartered certified accountant: Fair  Attention Span: Fair  Recall: [employer] of Knowledge: Fair  Language: Fair   Psychomotor Activity  Psychomotor Activity: Psychomotor Activity: Normal   Assets  Assets: Desire for Improvement; Physical Health   Sleep  Sleep: Sleep: Fair Number of Hours of Sleep: 6   Nutritional Assessment (  For OBS and [facility] admissions only) Has the patient had a weight loss or gain of 10 pounds or more in the last 3 months?: No Has the patient had a decrease in food intake/or appetite?: No Does the patient have dental problems?: No Does the patient have eating habits or behaviors that may be indicators of an eating disorder including binging or inducing vomiting?: No Has the patient recently lost weight without trying?: 0 Has the patient been eating poorly because of a decreased appetite?: 0 Malnutrition Screening Tool Score: 0    Physical Exam  Physical Exam Vitals reviewed.  Constitutional:      Appearance: Normal appearance.  HENT:     Head: Normocephalic and atraumatic.     Nose: Nose normal.  Eyes:     Extraocular Movements: Extraocular  movements intact.     Pupils: Pupils are equal, round, and reactive to light.  Cardiovascular:     Rate and Rhythm: Normal rate and regular rhythm.  Pulmonary:     Effort: Pulmonary effort is normal.     Breath sounds: Normal breath sounds.  Neurological:     General: No focal deficit present.     Mental Status: He is alert.     Review of Systems  Psychiatric/Behavioral:  Positive for substance abuse. The patient has insomnia.    Blood pressure 109/64, pulse (!) 52, temperature 97.9 F (36.6 C), temperature source Oral, resp. rate 18, SpO2 100 %. There is no height or weight on file to calculate BMI.  Demographic Factors:  Male and Low socioeconomic status  Loss Factors: NA  Historical Factors: Impulsivity  Risk Reduction Factors:   Living with another person, especially a relative  Continued Clinical Symptoms:  Schizophrenia:   Paranoid or undifferentiated type  Cognitive Features That Contribute To Risk:  None    Suicide Risk:  Minimal: No identifiable suicidal ideation.  Patients presenting with no risk factors but with morbid ruminations; may be classified as minimal risk based on the severity of the depressive symptoms  Plan Of Care/Follow-up recommendations:  Other:  Follow-up here at [facility] for medication management. Safety discussed, patient will return here if any acute mental health symptoms develop. Patient verbalize understanding and agreement with plan.    Disposition: Home to Self   Joaquin Courts, NP 06/30/2023, 9:21 AM

## 2023-06-30 NOTE — ED Notes (Signed)
Patient is sleeping. Respirations equal and unlabored, skin warm and dry. No change in assessment or acuity. Routine safety checks conducted according to facility protocol. Will continue to monitor for safety.   

## 2023-06-30 NOTE — ED Notes (Signed)
Patient in milieu. Environment is secured. Will continue to monitor for safety. 

## 2023-06-30 NOTE — ED Notes (Signed)
Patient alert and oriented x 3. Denies SI/HI/AVH. Denies intent or plan to harm self or others. Routine conducted according to faculty protocol. Encourage patient to notify staff with any needs or concerns. Patient verbalized agreement and understanding. Will continue to monitor for safety. 

## 2023-06-30 NOTE — ED Notes (Signed)
Patient A&O x 4, ambulatory. Patient discharged in no acute distress. Patient denied SI/HI, A/VH upon discharge. Patient verbalized understanding of all discharge instructions explained by staff, to include follow up appointments, RX's and safety  Pt belongings returned to patient from locker intact. Patient escorted to lobby via staff for transport to destination. Safety maintained.

## 2024-02-06 ENCOUNTER — Ambulatory Visit
Admission: EM | Admit: 2024-02-06 | Discharge: 2024-02-06 | Disposition: A | Discharge status: Home / Self Care | Payer: Medicare Other | Attending: Family Medicine | Admitting: Family Medicine

## 2024-02-06 DIAGNOSIS — Z20828 Contact with and (suspected) exposure to other viral communicable diseases: Secondary | ICD-10-CM | POA: Insufficient documentation

## 2024-02-06 DIAGNOSIS — J069 Acute upper respiratory infection, unspecified: Secondary | ICD-10-CM | POA: Insufficient documentation

## 2024-02-06 LAB — RESP PANEL BY RT-PCR (FLU A&B, COVID) ARPGX2
Influenza A by PCR: NEGATIVE
Influenza B by PCR: NEGATIVE
SARS Coronavirus 2 by RT PCR: NEGATIVE

## 2024-02-06 NOTE — ED Provider Notes (Signed)
[facility]    CSN: [id number] Arrival date & time: 02/06/24  1243      History   Chief Complaint Chief Complaint  Patient presents with   Headache   Facial Pain   Emesis    HPI Peter Wilson is a 48 y.o. male.   HPI  History obtained from the patient. Peter Wilson presents for headache, abdominal pain, sweats, facial pain, vomiting that started 3 days. No known fever but felt very warm. Has been taking Tylenol.  Has been exposure to influenza.   Has asthma and smokes cigarettes.     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 Authorizing Provider  ARIPiprazole (ABILIFY) 30 MG tablet Take 1 tablet (30 mg total) by mouth daily. 06/30/23   Bing Neighbors, NP  benztropine (COGENTIN) 1 MG tablet Take 1 tablet (1 mg total) by mouth 2 (two) times daily. 06/30/23   Bing Neighbors, NP  divalproex (DEPAKOTE ER) 500 MG 24 hr tablet Take 1 tablet (500 mg total) by mouth 2 (two) times daily. 06/30/23   Bing Neighbors, NP  ferrous sulfate 325 (65 FE) MG EC tablet Take 1 tablet (325 mg total)  by mouth daily with breakfast. 06/30/23 09/28/23  Bing Neighbors, NP  haloperidol decanoate (HALDOL DECANOATE) 100 MG/ML injection Inject 100 mg into the muscle every 28 (twenty-eight) days.    [provider]  hydrOXYzine (ATARAX) 25 MG tablet Take 1 tablet (25 mg total) by mouth every 8 (eight) hours as needed. 06/30/23   Bing Neighbors, NP  OLANZapine (ZYPREXA) 10 MG tablet Take 1 tablet (10 mg total) by mouth at bedtime. 06/30/23   Bing Neighbors, NP  prazosin (MINIPRESS) 1 MG capsule Take 1 capsule (1 mg total) by mouth at bedtime. 06/30/23   Bing Neighbors, NP  traZODone (DESYREL) 50 MG tablet Take 1 tablet (50 mg total) by mouth at bedtime. 06/30/23   Bing Neighbors, NP    Family History History reviewed. No pertinent family history.  Social History Social History   Tobacco Use   Smoking status: Every Day    Current packs/day: 2.00    Types: Cigarettes   Smokeless tobacco: Never   Tobacco comments:    Unknown   Substance Use Topics   Alcohol use: Yes    Alcohol/week: 0.0 standard drinks of alcohol    Comment: Unknown    Drug use: Yes    Frequency: 7.0 times per week    Types: Marijuana, Cocaine    Comment: Unknown      Allergies  Chlorpromazine, Ibuprofen, Morphine, and Penicillins   Review of Systems Review of Systems: negative unless otherwise stated in HPI.      Physical Exam Triage Vital Signs ED Triage Vitals  Encounter Vitals Group     BP 02/06/24 1425 (!) 159/92     Systolic BP Percentile --      Diastolic BP Percentile --      Pulse Rate 02/06/24 1425 62     Resp 02/06/24 1425 16     Temp 02/06/24 1425 99.3 F (37.4 C)     Temp Source 02/06/24 1425 Oral     SpO2 02/06/24 1425 97 %     Weight 02/06/24 1425 168 lb (76.2 kg)     Height 02/06/24 1425 6\' 1"  (1.854 m)     Head Circumference --      Peak Flow --      Pain Score 02/06/24 1432 5     Pain Loc --      Pain Education --      Exclude from Growth Chart --    No data  found.  Updated Vital Signs BP (!) 159/92 (BP Location: Right Arm)   Pulse 62   Temp 99.3 F (37.4 C) (Oral)   Resp 16   Ht 6\' 1"  (1.854 m)   Wt 76.2 kg   SpO2 97%   BMI 22.16 kg/m   Visual Acuity Right Eye Distance:   Left Eye Distance:   Bilateral Distance:    Right Eye Near:   Left Eye Near:    Bilateral Near:     Physical Exam GEN:     alert, non-toxic appearing male in no distress    HENT:  mucus membranes moist, oropharyngeal without lesions or erythema, no tonsillar hypertrophy or exudates, clear nasal discharge, bilateral TM normal EYES:   pupils equal and reactive, no scleral injection or discharge NECK:  normal ROM, no lymphadenopathy, no meningismus   RESP:  no increased work of breathing, clear to auscultation bilaterally CVS:   regular rate and rhythm Skin:   warm and dry, no rash on visible skin    UC Treatments / Results  Labs (all labs ordered are listed, but only abnormal results are displayed) Labs Reviewed  RESP PANEL BY RT-PCR (FLU A&B, COVID) [id number]    EKG   Radiology No results found.  Procedures Procedures (including critical care time)  Medications Ordered in UC Medications - No data to display  Initial Impression / Assessment and Plan / UC Course  I have reviewed the triage vital signs and the nursing notes.  Pertinent labs & imaging results that were available during my care of the patient were reviewed by me and considered in my medical decision making (see chart for details).       Pt is a 48 y.o. male who presents for 3 days of respiratory symptoms. [name] is afebrile here without recent antipyretics. Satting well on room air. Overall pt is non-toxic appearing, well hydrated, without respiratory distress. Pulmonary exam is unremarkable.  COVID and influenza panel obtained and was negative. History consistent with viral respiratory illness. Discussed symptomatic treatment.  Explained lack of efficacy of antibiotics in viral  disease.  Typical duration of symptoms discussed.   Return and ED precautions given and voiced understanding. Discussed MDM, treatment plan and plan for follow-up with patient who agrees with plan.     Final Clinical Impressions(s) / UC Diagnoses   Final diagnoses:  Viral URI with cough  Exposure to influenza  Discharge Instructions      Your COVID and influenza tests are negative. You likely have a viral respiratory infection that will gradually improve over the next 7-10 days. Cough may last up to 3 weeks.    You can take Tylenol and/or Ibuprofen as needed for fever reduction and pain relief.    For cough: honey 1/2 to 1 teaspoon (you can dilute the honey in water or another fluid).  You can also use guaifenesin and dextromethorphan for cough. You can use a humidifier for chest congestion and cough.  If you don't have a humidifier, you can sit in the bathroom with the hot shower running.      For sore throat: try warm salt water gargles, Mucinex sore throat cough drops or cepacol lozenges, throat spray, warm tea or water with lemon/honey, popsicles or ice, or OTC cold relief medicine for throat discomfort. You can also purchase chloraseptic spray at the pharmacy or dollar store.   For congestion: take a daily anti-histamine like Zyrtec, Claritin, and a oral decongestant, such as pseudoephedrine.  You can also use Flonase 1-2 sprays in each nostril daily. Afrin is also a good option, if you do not have high blood pressure.    It is important to stay hydrated: drink plenty of fluids (water, gatorade/powerade/pedialyte, juices, or teas) to keep your throat moisturized and help further relieve irritation/discomfort.    Return or go to the Emergency Department if symptoms worsen or do not improve in the next few days      ED Prescriptions   None    PDMP not reviewed this encounter.   Katha Cabal, DO 02/06/24 1753

## 2024-02-06 NOTE — Discharge Instructions (Signed)
Your COVID and influenza tests are negative. You likely have a viral respiratory infection that will gradually improve over the next 7-10 days. Cough may last up to 3 weeks.    You can take Tylenol and/or Ibuprofen as needed for fever reduction and pain relief.    For cough: honey 1/2 to 1 teaspoon (you can dilute the honey in water or another fluid).  You can also use guaifenesin and dextromethorphan for cough. You can use a humidifier for chest congestion and cough.  If you don't have a humidifier, you can sit in the bathroom with the hot shower running.      For sore throat: try warm salt water gargles, Mucinex sore throat cough drops or cepacol lozenges, throat spray, warm tea or water with lemon/honey, popsicles or ice, or OTC cold relief medicine for throat discomfort. You can also purchase chloraseptic spray at the pharmacy or dollar store.   For congestion: take a daily anti-histamine like Zyrtec, Claritin, and a oral decongestant, such as pseudoephedrine.  You can also use Flonase 1-2 sprays in each nostril daily. Afrin is also a good option, if you do not have high blood pressure.    It is important to stay hydrated: drink plenty of fluids (water, gatorade/powerade/pedialyte, juices, or teas) to keep your throat moisturized and help further relieve irritation/discomfort.    Return or go to the Emergency Department if symptoms worsen or do not improve in the next few days

## 2024-02-06 NOTE — ED Triage Notes (Signed)
Pt c/o HA,facial pain & emesis x3 days. Denies any fevers.

## 2024-09-15 NOTE — Consults (Signed)
 [facility]   Psychiatry Emergency Service    Follow-up Consult  Service Date:  September 15, 2024 Admit Date/Time: 09/14/2024  7:56 PM LOS: Total duration of encounter: 1 day Service requesting consult:  Emergency Medicine  Requesting Attending Physician:  Toribio LELON Hazel, MD Location of patient:IN-PERSON--[facility] ED Consulting Attending: Jon Bicker, MD Consulting Resident/Provider: Arlyne FORBES Ruth, PMHNP   Interval History and Follow-up Assessment  Peter Wilson is a 48 y.o., Black/African American race, Not Hispanic, Latino/a, or Spanish origin ethnicity, ENGLISH speaking male with a history of schizophrenia (w/ chronic poor medication adherence), PTSD, substance use disorder, antisocial personality disorder, and malingering, who presents for psychiatric evaluation.  On assessment this morning, pt is irritable, although cooperative. He denies SI, HI, AVH, paranoia. Reports he was brought to [facility] from [facility] because he would not sign a document there.  He has not required restraints or medications for agitation during admission. Appears to be back to his psychiatric baseline. Pt reports receiving services at [facility] in [place] and agrees to following up there for outpatient behavioral health services.  Although this patient presented to the ED for a psychiatric evaluation, the patient does not appear to be at imminent risk of dangerousness to self, dangerousness to others, and serious withdrawal from detoxification at this time. While future psychiatric events cannot be accurately predicted, the patient does not necessitate nor desire further acute inpatient psychiatric care and it is not felt that inpatient hospitalization would be therapeutic or beneficial for the patient at this time. The patient weighed the risks and benefits of an inpatient admission and voiced understanding of the risks involved in returning to community. The patient responded well to supportive  and problem solving therapeutic interventions.      A thorough psychiatric evaluation has been completed including evaluation of the patient, reviewing available medical/clinic records, and discussing treatment recommendations. A suicide and violence risk assessment was performed as part of this evaluation. Specific questioning about thoughts, plans, suicidal intent, and self-harm indicate the patient is NOT at acutely elevated risk of suicide/dangerousness to others and further worsening of psychiatric condition. Risk factors for self-harm/suicide: impulsive tendencies, history of substance abuse, diagnosis of schizophrenia, poor adherence to treatment , chronic hostility, chronic mood lability , chronic impulsivity, and chronic poor judgment. Protective Factors against self-harm/suicide: lack of active SI, no know access to weapons or firearms, and currently receiving mental health treatment. Risk Factors for harm to others: recent agitation, recent loss, past substance abuse, and chronic impulsivity. Protective factors against harm to others: no active symptoms of psychosis and no active symptoms of mania. Inpatient hospitalization is NOT warranted at this time. This was explicitly discussed with parent.  Diagnoses:  Active Problems:   Schizophrenia    (CMS-HCC)   Antisocial personality disorder    (CMS-HCC)   PTSD (post-traumatic stress disorder)  Stressors: per pt, being in the emergency department  Plan -- Disposition: The Psychiatry Emergency Service treatment team recommends discharge to community.  The Psychiatry Emergency Service consultant recommends returning to the nearest ED or calling 911 if the patient develops any intent or plan of harming self or others.    -- Observation Level:  This patient had recent ASQ screening performed that rated them as    Based on my clinical evaluation, I estimate the patient to be at low risk for suicide in the current setting. At this time we  recommend Q 15 Minute Obs.  This decision is based on my review of  the chart including patient's history and current presentation, interview of the patient, mental status examination, and consideration of suicide risk including evaluating suicidal ideation, plan, intent, suicidal or self-harm behaviors, risk factors, and protective factors. This will be reassessed if there is clinically significant change in the status of the patient. This judgment is based on our ability to directly address suicide risk, implement suicide prevention strategies and develop a safety plan while the patient is in the clinical setting.   -- Medication Recommendations: Follow up with outpatient behavioral services  -- Behavioral Recommendations: Follow up with outpatient behavioral services   -- Resources: Patient was given numbers for providers in their area.  Thank you for this consult. Should you have any questions regarding the assessment, plan, or recommendations, please contact PES team by sending an Epic Chat to [facility] Psych ED Consult Team.   TIME SPENT: 30 minutes  INTERVENTION: risk assessment, supportive psychotherapy, psychoeducation, crisis counseling, and reality testing.  PES Attending on-shift, Jon Bicker, MD, was available.   Jacqueline E Lee, PMHNP  Subjective: Reason for Continued Consultation: Safety Evaluation  Pt Interview: Pt reports he was brought to [facility] because he wouldn't sign a document at [facility]. He states he does not want to sign any documents. He denies SI, HI, AVH, paranoia. Reports he is connected with outpatient behavioral health services at [facility] in [place]. States his family takes him there. He cannot recall the names of his medications, although states that he took one last night. Reports he uses weed once in a while, usually about once or twice/week. Pt states that he was not allowed to use the bathroom last night and he urinated in his room instead. Pt  states that he has a bus pass and does not need transportation. He states he plans on getting in contact with his lawyer about the events that brought him to [facility]. He states he does not plan on going back to [facility].   Pertinent Negatives: The pt denies SI, HI, hallucinations, and paranoia.   Objective:  VS:  Vital Signs Pulse: 60 SpO2 Pulse: 60 Heart Rate Source: Monitor  Mental Status Exam: Appearance:    Dressed in hospital scrubs  Behavior:   Cooperative and Irritable  Motor:   No abnormal movements  Speech/Language:    Normal rate, volume, tone, fluency  Mood:   I'm ready to go  Affect:   Cooperative and Irritable  Thought process:   Logical, linear, clear, coherent, goal directed  Thought content:     Denies SI, HI, self harm, delusions, obsessions, paranoid ideation, or ideas of reference  Perceptual disturbances:     Denies auditory and visual hallucinations, behavior not concerning for response to internal stimuli   Orientation:   Oriented to person, place, time, and general circumstances  Attention:   Able to fully attend without fluctuations in consciousness  Concentration:   Able to fully concentrate and attend  Memory:   Immediate, short-term, long-term, and recall grossly intact   Fund of knowledge:    Consistent with level of education and development  Insight:     Chronically limited  Judgment:    Chronically limited  Impulse Control:   Chronically limited   ++APP++   ROS: No physical complaints  PHYS: Alert Oriented x4 In no acute distress Non toxic appearing
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
## 2024-10-18 NOTE — ED Triage Notes (Signed)
 Pt BIB [facility], coming from street. Requesting mental eval. Hx of schizophrenia. Denies SI/HI currently.

## 2024-10-18 NOTE — ED Notes (Signed)
 Pt handed AVS, Pt understood discharge teachings, pt transported to lobby in stable condition for departure
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
## 2024-10-18 NOTE — ED Provider Notes (Signed)
 [facility] EMERGENCY DEPARTMENT   ED Provider Note History   Chief Complaint  Patient presents with  . Nasal Congestion    History of Present Illness Informant: patient   Peter Wilson is a 48 y.o. male with history of antisocial personality disorder, schizoaffective disorder, bipolar type, asthma, who presents to the ER for sinus congestion ongoing for the last 2 to 3 days.  He reports runny nose, sinus pressure.  Denies fever or chills.  Reports sneezing.  Reports headache.  Denies sore throat.  Denies cough or shortness of breath.  He tried NyQuil sinus and flu which he states just put him to sleep.    Past Medical History:  Diagnosis Date  . Antisocial personality disorder (CMS/HHS-HCC)   . Asthma, unspecified asthma severity, unspecified whether complicated, unspecified whether persistent (HHS-HCC)   . Schizoaffective disorder, bipolar type (CMS/HHS-HCC)   . Tremor   . Undifferentiated schizophrenia (CMS/HHS-HCC)    Past Surgical History:  Procedure Laterality Date  . LAPAROSCOPY DIAGNOSTIC N/A 11/30/2021   Procedure: LAPAROSCOPY, SURGICAL; ABDOMEN, PERITONEUM, AND OMENTUM, DIAGNOSTIC, WITH OR WITHOUT COLLECTION OF SPECIMEN(S) BY BRUSHING OR WASHING (SEPARATE PROCEDURE);  Surgeon: Kennyth Drena Bars, MD;  Location: [facility] OR;  Service: General Surgery;  Laterality: N/A;  . EXPLORATION PENETRATING ABDOMINAL WOUND Left 11/30/2021   Procedure: EXPLORATION OF PENETRATING WOUND (SEPARATE PROCEDURE); ABDOMEN;  Surgeon: Kennyth Joylene Bars, MD;  Location: [facility] OR;  Service: General Surgery;  Laterality: Left;   No family history on file. Social History   Socioeconomic History  . Marital status: Single  Tobacco Use  . Smoking status: Every Day    Current packs/day: 0.50    Types: Cigarettes  . Smokeless tobacco: Never  Vaping Use  . Vaping status: Some Days  Substance and Sexual Activity  . Alcohol use: Yes    Comment: Up to 1/2 bottle on one day  . Drug  use: Yes    Types: Marijuana    Comment: cocaine  . Sexual activity: Yes    Partners: Female  Social History Narrative   ** Merged History Encounter **       Social Drivers of Health   Financial Resource Strain: Medium Risk (06/20/2023)   Received from [facility]   Overall Financial Resource Strain (CARDIA)   . Difficulty of Paying Living Expenses: Somewhat hard  Food Insecurity: Low Risk  (08/16/2023)   Received from [facility]   Food Insecurity   . Within the past 12 months, did the food you bought just not last and you didn't have money to get more?: No   . Within the past 12 months, did you worry that your food would run out before you got money to buy more?: No  Recent Concern: Food Insecurity - Food Insecurity Present (06/20/2023)   Received from [facility]   Hunger Vital Sign   . Within the past 12 months, you worried that your food would run out before you got the money to buy more.: Often true   . Within the past 12 months, the food you bought just didn't last and you didn't have money to get more.: Often true  Transportation Needs: Low Risk  (08/16/2023)   Received from [facility]   Transportation Needs   . Within the past 12 months, has a lack of transportation kept you from medical appointments or from doing things needed for daily living?: No  Recent Concern: Transportation Needs - Unmet Transportation Needs (06/20/2023)  Received from [facility] - Transportation   . Lack of Transportation (Medical): Yes   . Lack of Transportation (Non-Medical): Yes  Physical Activity: Inactive (06/18/2023)   Received from [facility]   Exercise Vital Sign   . On average, how many days per week do you engage in moderate to strenuous exercise (like a brisk walk)?: 0 days   . On average, how many minutes do you engage in exercise at this level?: 0 min  Stress: Stress Concern Present (06/18/2023)   Received from [facility] - Occupational Stress Questionnaire   . Feeling of Stress : Very much  Social Connections: Socially Isolated (06/18/2023)   Received from [facility]   Social Connection and Isolation Panel   . In a typical week, how many times do you talk on the phone with family, friends, or neighbors?: Never   . How often do you get together with friends or relatives?: Never   . How often do you attend church or religious services?: Never   . Do you belong to any clubs or organizations such as church groups, unions, fraternal or athletic groups, or school groups?: No   . How often do you attend meetings of the clubs or organizations you belong to?: Never   . Are you married, widowed, divorced, separated, never married, or living with a partner?: Divorced  Housing Stability: Low Risk  (08/16/2023)   Received from [facility]   Housing Stability   . Within the past 12 months, have you ever stayed: outside, in a car, in a tent, in an overnight shelter, or temporarily in someone else's home (i.e. couch-surfing)?: No   . Are you worried about losing your housing? : No  Recent Concern: Housing Stability - High Risk (06/20/2023)   Received from [facility]   . Within the past 12 months, have you ever stayed: outside, in a car, in a tent, in an overnight shelter, or temporarily in someone else's home(i.e.couch-surfing)?: Yes   . Are you worried about losing your housing?: Yes    Review of Systems All other systems negative except as noted above in HPI.  Physical Exam  BP 104/67 (BP Location: Right upper arm, Patient Position: Sitting)   Pulse 79   Temp 37 C (98.6 F) (Oral)   Resp 16   SpO2 97%  Physical Exam Vitals and nursing note reviewed.  Constitutional:      General: He is not in acute distress.    Appearance: He is well-developed. He is not toxic-appearing.  HENT:     Head: Normocephalic and atraumatic.     Nose:  Congestion present.     Right Sinus: Maxillary sinus tenderness present. No frontal sinus tenderness.     Left Sinus: Maxillary sinus tenderness present. No frontal sinus tenderness.     Mouth/Throat:     Pharynx: Oropharynx is clear. Uvula midline.  Eyes:     Conjunctiva/sclera: Conjunctivae normal.     Pupils: Pupils are equal, round, and reactive to light.  Cardiovascular:     Rate and Rhythm: Normal rate and regular rhythm.     Pulses: Normal pulses.     Heart sounds: Normal heart sounds. No murmur heard. Pulmonary:     Effort: Pulmonary effort is normal. No respiratory distress.     Breath sounds: Normal breath sounds. No wheezing, rhonchi or rales.  Musculoskeletal:  General: No swelling.     Cervical back: Neck supple.  Skin:    General: Skin is warm and dry.     Capillary Refill: Capillary refill takes less than 2 seconds.  Neurological:     General: No focal deficit present.     Mental Status: He is alert and oriented to person, place, and time.  Psychiatric:        Mood and Affect: Mood normal.        Behavior: Behavior normal.      Procedures  Procedures TIP (no need to delete)  Delete the Procedures section if patient does not have a procedure.  Testing  LABS: No results found for this or any previous visit (from the past 24 hours).  IMAGING: Notified that Radiology will read radiologic studies and patient will be called if change in report by resource nurse and/or radiology department. No orders to display   Medical Decision Making and ED Course  Given the patient's initial exam and evaluation, the following diagnostic evaluation has been ordered. The patient will be placed in the appropriate treatment space, once one is available to complete the evaluation and treatment. I have discussed the plan of care with the patient/representative and have advised that the plan of care may change based upon the results of diagnostic testing. The  patient/representative has been asked to not leave prior to the completion of their evaluation. I have advised that if they leave prior to the completion of their evaluation, they will be leaving against medical advice.  MDM: Peter Wilson is a 48 y.o. male who presents to the ED for evaluation of sinus congestion. Vitals reviewed and reassuring, normotensive, afebrile. No tachypnea or hypoxia. Alert and oriented male, overall well-appearing, in no acute cardiopulmonary distress. Neurologically intact without focal deficit. Lungs clear bilaterally, S1/S2 present.  Nasal congestion noted.  Decongestant, viral swab  Very likely viral sinusitis.  No evidence of acute bacterial rhinosinusitis based on HPI and exam.  Consider also viral URI such as COVID, flu, RSV.  Very low concern for pneumonia.  Medical Complexity:  [] New and requires workup. [x] New and does not require workup. [] Pertinent labs & imaging results were reviewed by me and considered in my decision making. [] I obtained history from someone other than the patient. [x] I reviewed previous medical records. [] I independently visualized image(s), tracing(s), and/or specimen(s). [] I discussed the patient with another provider.  ED COURSE:  Medications  pseudoephedrine (SUDAFED) tablet 60 mg (60 mg Oral Given 10/18/24 1835)       6:46 PM Discussed results with patient and plan for discharge home with antihistamine. Discussed RTER precautions and they verbalized understanding.    ED Clinical Impression  1. Nasal congestion      ED Disposition  Discharge   Discharge Medications    Medication List    START taking these medications   . cetirizine 10 MG tablet; Commonly known as: ZyrTEC; Take 1 tablet (10 mg  total) by mouth once daily for 30 days   ASK your doctor about these medications   . acetaminophen  325 MG tablet; Commonly known as: TYLENOL ; Take 2 tablets  (650 mg total) by mouth every 6 (six) hours as needed for Pain or  Fever . benztropine  1 MG tablet; Commonly known as: COGENTIN ; Take 1 tablet (1  mg total) by mouth once daily for 30 days . divalproex  250 MG DR tablet; Commonly known as: DEPAKOTE ; Take 3 tablets  (750 mg total) by mouth every 12 (twelve) hours  for 90 days . ibuprofen  400 MG tablet; Commonly known as: MOTRIN ; Take 1 tablet (400  mg total) by mouth every 6 (six) hours as needed for Pain . multivitamin with iron tablet; Take 1 tablet by mouth once daily . OLANZapine  5 MG tablet; Commonly known as: ZyPREXA ; Take 1 tablet (5 mg  total) by mouth at bedtime for 5 days . oxyCODONE  5 MG immediate release tablet; Commonly known as: ROXICODONE ;  Take 1-2 tablets (5-10 mg total) by mouth every 4 (four) hours as needed  for up to 3 days . sodium chloride  0.65 % nasal spray; Commonly known as: OCEAN; Place 2  sprays into both nostrils as needed for Congestion     Follow-up  Provider  Schedule an appointment as soon as possible for a visit  For ED follow-up       TIP (no need to delete)  Click Refresh button to add wrap-up information to note.  I personally evaluated and took responsibility for care of this patient. The physician supervisor was Fonda Iles, MD who was available for consult and attested to or co-signed this chart. The service was performed non-incident to a physician.    KATHERINE LUTZ, NP  This note was partially constructed using a voice recognition system and may contain typographical errors missed during proofreading.    Herschell Crank, NP 10/18/24 1846
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
## 2024-10-18 NOTE — ED Notes (Signed)
 Pt escorted to [facility] from Ambulance bay @ 2215. Vitals and applied materials

## 2024-10-18 NOTE — Care Plan (Signed)
 Voucher for Transportation and [employer] [facility] Emergency Department  Voucher# = MRN# + Request Date MRN# [id number] Request Date 10/18/24 Type of Transportation: Discharge Transportation Pick up location: [facility] Patient Peter Wilson Patient 12/27/1975 Patient Facility: [facility] Department Authorizing this voucher: [facility] Emergency Department     Transportation Voucher Info Discharge Voucher Type: Taxi: Destination Address: [address] [name], Painter: [place], [place]: WR,[address], Home Phone: [phone], and Taxi Operator: [employer] [phone] alternate number [phone]   Amount: $   [facility] Customer ID: [id number] NO ADDITIONAL STOPS    *Universal Pandemic Precautions - all patients and families are instructed to wear masks during transport*

## 2024-10-19 ENCOUNTER — Emergency Department
Admission: EM | Admit: 2024-10-19 | Discharge: 2024-10-23 | Disposition: A | Discharge status: Home / Self Care | Attending: Emergency Medicine | Admitting: Emergency Medicine

## 2024-10-19 ENCOUNTER — Other Ambulatory Visit: Payer: Self-pay

## 2024-10-19 DIAGNOSIS — R449 Unspecified symptoms and signs involving general sensations and perceptions: Secondary | ICD-10-CM | POA: Diagnosis not present

## 2024-10-19 DIAGNOSIS — F99 Mental disorder, not otherwise specified: Secondary | ICD-10-CM | POA: Diagnosis present

## 2024-10-19 DIAGNOSIS — F209 Schizophrenia, unspecified: Secondary | ICD-10-CM | POA: Insufficient documentation

## 2024-10-19 DIAGNOSIS — F39 Unspecified mood [affective] disorder: Secondary | ICD-10-CM | POA: Insufficient documentation

## 2024-10-19 DIAGNOSIS — F29 Unspecified psychosis not due to a substance or known physiological condition: Secondary | ICD-10-CM | POA: Insufficient documentation

## 2024-10-19 DIAGNOSIS — M79641 Pain in right hand: Secondary | ICD-10-CM | POA: Diagnosis not present

## 2024-10-19 DIAGNOSIS — R45850 Homicidal ideations: Secondary | ICD-10-CM | POA: Insufficient documentation

## 2024-10-19 DIAGNOSIS — F17200 Nicotine dependence, unspecified, uncomplicated: Secondary | ICD-10-CM | POA: Insufficient documentation

## 2024-10-19 DIAGNOSIS — F489 Nonpsychotic mental disorder, unspecified: Secondary | ICD-10-CM

## 2024-10-19 DIAGNOSIS — R443 Hallucinations, unspecified: Secondary | ICD-10-CM

## 2024-10-19 DIAGNOSIS — F602 Antisocial personality disorder: Secondary | ICD-10-CM | POA: Insufficient documentation

## 2024-10-19 DIAGNOSIS — Z79899 Other long term (current) drug therapy: Secondary | ICD-10-CM | POA: Insufficient documentation

## 2024-10-19 DIAGNOSIS — F4310 Post-traumatic stress disorder, unspecified: Secondary | ICD-10-CM | POA: Diagnosis not present

## 2024-10-19 LAB — CBC
HCT: 35.8 % — ABNORMAL LOW (ref 39.0–52.0)
Hemoglobin: 11.3 g/dL — ABNORMAL LOW (ref 13.0–17.0)
MCH: 29.1 pg (ref 26.0–34.0)
MCHC: 31.6 g/dL (ref 30.0–36.0)
MCV: 92.3 fL (ref 80.0–100.0)
Platelets: 326 K/uL (ref 150–400)
RBC: 3.88 MIL/uL — ABNORMAL LOW (ref 4.22–5.81)
RDW: 13.2 % (ref 11.5–15.5)
WBC: 3.9 K/uL — ABNORMAL LOW (ref 4.0–10.5)
nRBC: 0 % (ref 0.0–0.2)

## 2024-10-19 LAB — COMPREHENSIVE METABOLIC PANEL WITH GFR
ALT: 16 U/L (ref 0–44)
AST: 36 U/L (ref 15–41)
Albumin: 4.1 g/dL (ref 3.5–5.0)
Alkaline Phosphatase: 56 U/L (ref 38–126)
Anion gap: 6 (ref 5–15)
BUN: 15 mg/dL (ref 6–20)
CO2: 28 mmol/L (ref 22–32)
Calcium: 8.9 mg/dL (ref 8.9–10.3)
Chloride: 104 mmol/L (ref 98–111)
Creatinine, Ser: 1.03 mg/dL (ref 0.61–1.24)
GFR, Estimated: >60 mL/min (ref >60)
Glucose, Bld: 91 mg/dL (ref 70–99)
Potassium: 4 mmol/L (ref 3.5–5.1)
Sodium: 138 mmol/L (ref 135–145)
Total Bilirubin: 0.5 mg/dL (ref 0.0–1.2)
Total Protein: 7.8 g/dL (ref 6.5–8.1)

## 2024-10-19 LAB — ETHANOL: Alcohol, Ethyl (B): <15 mg/dL (ref <15)

## 2024-10-19 LAB — URINE DRUG SCREEN, QUALITATIVE (ARMC ONLY)
Amphetamines, Ur Screen: NOT DETECTED
Barbiturates, Ur Screen: NOT DETECTED
Benzodiazepine, Ur Scrn: NOT DETECTED
Cannabinoid 50 Ng, Ur ~~LOC~~: POSITIVE — AB
Cocaine Metabolite,Ur ~~LOC~~: NOT DETECTED
MDMA (Ecstasy)Ur Screen: NOT DETECTED
Methadone Scn, Ur: NOT DETECTED
Opiate, Ur Screen: NOT DETECTED
Phencyclidine (PCP) Ur S: NOT DETECTED
Tricyclic, Ur Screen: NOT DETECTED

## 2024-10-19 MED ORDER — LORAZEPAM 1 MG PO TABS
1.0000 mg | ORAL_TABLET | Freq: Four times a day (QID) | ORAL | Status: DC | PRN
  Administered 2024-10-19 – 2024-10-23 (×6): 1 mg via ORAL
  Filled 2024-10-19 (×6): qty 1

## 2024-10-19 MED ORDER — HALOPERIDOL 5 MG PO TABS
5.0000 mg | ORAL_TABLET | Freq: Every day | ORAL | Status: DC
  Administered 2024-10-19 – 2024-10-20 (×2): 5 mg via ORAL
  Filled 2024-10-19 (×3): qty 1

## 2024-10-19 MED ORDER — HALOPERIDOL 5 MG PO TABS
5.0000 mg | ORAL_TABLET | Freq: Four times a day (QID) | ORAL | Status: DC | PRN
  Administered 2024-10-22: 5 mg via ORAL
  Filled 2024-10-19: qty 1

## 2024-10-19 MED ORDER — HYDROXYZINE HCL 25 MG PO TABS
25.0000 mg | ORAL_TABLET | Freq: Once | ORAL | Status: AC
  Administered 2024-10-19: 25 mg via ORAL
  Filled 2024-10-19: qty 1

## 2024-10-19 MED ORDER — ACETAMINOPHEN 500 MG PO TABS
1000.0000 mg | ORAL_TABLET | Freq: Once | ORAL | Status: AC
  Administered 2024-10-19: 1000 mg via ORAL
  Filled 2024-10-19: qty 2

## 2024-10-19 MED ORDER — HALOPERIDOL LACTATE 5 MG/ML IJ SOLN
5.0000 mg | Freq: Four times a day (QID) | INTRAMUSCULAR | Status: DC | PRN
  Administered 2024-10-20: 5 mg via INTRAMUSCULAR
  Filled 2024-10-19: qty 1

## 2024-10-19 MED ORDER — DIPHENHYDRAMINE HCL 50 MG/ML IJ SOLN
50.0000 mg | Freq: Four times a day (QID) | INTRAMUSCULAR | Status: DC | PRN
  Administered 2024-10-20: 50 mg via INTRAMUSCULAR
  Filled 2024-10-19: qty 1

## 2024-10-19 MED ORDER — OXYCODONE HCL 5 MG PO TABS
5.0000 mg | ORAL_TABLET | Freq: Once | ORAL | Status: AC
  Administered 2024-10-19: 5 mg via ORAL
  Filled 2024-10-19: qty 1

## 2024-10-19 MED ORDER — DIPHENHYDRAMINE HCL 25 MG PO CAPS: 50.0000 mg | ORAL_CAPSULE | Freq: Four times a day (QID) | ORAL | Status: DC | PRN

## 2024-10-19 MED ORDER — OXYCODONE-ACETAMINOPHEN 5-325 MG PO TABS
1.0000 | ORAL_TABLET | Freq: Four times a day (QID) | ORAL | Status: DC | PRN
  Administered 2024-10-19 – 2024-10-20 (×2): 1 via ORAL
  Filled 2024-10-19 (×4): qty 1
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
## 2024-10-19 NOTE — ED Notes (Addendum)
 Pt dressed out:  Blue hoodie 1 - pink backpack Blue jeans  White shoes [employer] Cell phone - multiple cracks in screen Grey pants Blue shorts Grey hospital socks   Pt has cast on right wrist

## 2024-10-19 NOTE — ED Notes (Signed)
 Lunch tray given to pt.

## 2024-10-19 NOTE — ED Notes (Signed)
 Notified EDP Cyrena of pt right hand pain and request for pain medication.

## 2024-10-19 NOTE — ED Notes (Signed)
 Pt endorses improvement with itching.
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
## 2024-10-19 NOTE — BH Assessment (Addendum)
 Inpatient placement.   At the request of the [facility] Disposition Counselor, I was asked to re-fax the patient's information to alternative facilities for placement. Below are the hospitals to be considered for review of the patient for admission or placement, as per the Disposition Counselor's request.   Destination  Service Provider Request Status Services Address Phone Fax Patient Preferred  [facility]  Pending - Request Sent -- [address] [place] [address] [phone] --  [facility]  Pending - Request Sent -- [address] [place] [address] [phone] --  [facility]  Pending - Request Sent -- [address] [place], [place] [address] [phone] --  [facility]  Pending - Request Sent -- [address] [place] [address] [phone] --  [facility]  Pending - Request Sent -- [address], [place] [address] [phone] --  [facility]  Pending - Request Sent -- [address] [place] [address] [phone] --  [facility]  Pending - Request Sent -- [address], [place] [address] [phone] --  [facility]  Pending - Request Sent -- [place] [phone] --  [facility]  Pending - Request Sent -- [address] [place] [address] [phone] --  [facility]  Pending - Request Sent -- [address] [place] [address] [phone] --  [facility]  Pending - Request Sent -- [address] [place] [address] [phone] --  [facility]  Pending - Request Sent -- [address] [place] [address] [phone] --  [facility]  Inpatient [facility]  Pending - Request Sent -- [place] [phone] --

## 2024-10-19 NOTE — BH Assessment (Addendum)
 Destination  Service Provider Request Status Services Address Phone Fax Patient Preferred  [facility]  Pending - No Request Sent -- [address], [place] [address] [phone] --  [facility]  Pending - No Request Sent -- [address] [place] [address] [phone] --  [facility]  Pending - No Request Sent -- [address] [place], [place] [address] [phone] --  [facility]  Pending - No Request Sent -- [address] [place] [address] [phone] --  [facility]  Pending - No Request Sent -- [address] [place] [address] [phone] --  [facility]  Pending - No Request Sent -- [address] [place] [address] [phone] --  [facility]  Pending - No Request Sent -- [address], [place] [address] [phone] --  [facility]  Pending - No Request Sent -- [place] [phone] --  [facility]  Pending - No Request Sent -- [address] [place] [address] [phone] --  [facility]  Pending - No Request Sent -- [address] [place] [address] [phone] --  [facility]  Pending - No Request Sent -- [address] [place] [address] [phone] --  [facility]  Pending - No Request Sent -- [address] [place] [address] [phone] --  [facility] Inpatient EFAX  Pending - No Request Sent -- [place] [phone] --

## 2024-10-19 NOTE — ED Notes (Signed)
 Pt c/o itching under rt wrist/hand splint.  EDP Cyrena notified and ordered Hyroxyzine.  Medication administered and pt advised to aldo try ice pack on hand.

## 2024-10-19 NOTE — ED Notes (Signed)
 ED Provider at bedside.
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
## 2024-10-19 NOTE — ED Notes (Addendum)
 Obtained pt labs in left antecubital vein at this time and sent to lab.

## 2024-10-19 NOTE — ED Notes (Signed)
VOL/pending dispo
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
## 2024-10-19 NOTE — ED Notes (Signed)
 Pt given safety sandwich tray and gingerale per his request.

## 2024-10-19 NOTE — ED Notes (Signed)
 Patient is vol pending placement
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
## 2024-10-20 MED ORDER — OXYCODONE HCL 5 MG PO TABS
5.0000 mg | ORAL_TABLET | Freq: Four times a day (QID) | ORAL | Status: DC | PRN
  Administered 2024-10-21 – 2024-10-23 (×6): 5 mg via ORAL
  Filled 2024-10-20 (×6): qty 1

## 2024-10-20 MED ORDER — ACETAMINOPHEN 325 MG PO TABS
650.0000 mg | ORAL_TABLET | Freq: Four times a day (QID) | ORAL | Status: DC | PRN
  Administered 2024-10-20 – 2024-10-23 (×5): 650 mg via ORAL
  Filled 2024-10-20 (×5): qty 2

## 2024-10-20 MED ORDER — LORAZEPAM 2 MG/ML IJ SOLN
2.0000 mg | Freq: Once | INTRAMUSCULAR | Status: AC
  Administered 2024-10-20: 2 mg via INTRAMUSCULAR
  Filled 2024-10-20: qty 1

## 2024-10-20 MED ORDER — ZIPRASIDONE MESYLATE 20 MG IM SOLR
10.0000 mg | Freq: Once | INTRAMUSCULAR | Status: AC
  Administered 2024-10-21: 10 mg via INTRAMUSCULAR
  Filled 2024-10-20: qty 20
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
## 2024-10-20 NOTE — ED Notes (Signed)
 Pt agitated at this time, stating that he wants to see the doctor. Pt yelling in the day room, stating that he is about to go back to the old [facility] if the doctor doesn't come see him.

## 2024-10-20 NOTE — ED Notes (Signed)
 Security in dayroom speaking with pt in an attempt to de-escalate pt. Pt agitated because other pt informed of potential acceptance to another facility.

## 2024-10-20 NOTE — ED Notes (Signed)
 Pt resting in bed, noted to have equal rise and fall of chest.

## 2024-10-20 NOTE — ED Provider Notes (Addendum)
 Emergency Medicine Observation Re-evaluation Note  Peter Wilson is a 48 y.o. male, seen on rounds today.  Pt initially presented to the ED for complaints of Psychiatric Evaluation Currently, the patient is resting.  Physical Exam  BP 133/68 (BP Location: Left Arm)   Pulse 60   Temp 98.6 F (37 C) (Oral)   Resp 16   Ht 6' 1 (1.854 m)   SpO2 96%   BMI 22.16 kg/m  Physical Exam .Gen:  No acute distress Resp:  Breathing easily and comfortably, no accessory muscle usage Neuro:  Moving all four extremities, no gross focal neuro deficits Psych:  Resting currently, calm when awake   ED Course / MDM  EKG:   I have reviewed the labs performed to date as well as medications administered while in observation.  Recent changes in the last 24 hours include no acute events.  Plan  Current plan is for psych dispo.  Patient is escalating, becoming more aggressive, shouting at staff, not verbally redirectable, will give him a dose of the as needed Haldol , Ativan , Benadryl .  On independent review of psychiatry's note yesterday, they are keeping him voluntary for now but if he escalates, will need to be placed on IVC hold given that he had verbalized homicidal ideations towards male cousin.  Given that he is not redirectable, now threatening staff, saying that he will kill all of us , was on voluntary but given his escalation, and wanting to leave, he is a danger to staff and others, we will place him on IVC hold.     Waymond Lorelle Cummins, MD 10/20/24 9340    Waymond Lorelle Cummins, MD 10/20/24 1334    Waymond Lorelle Cummins, MD 10/20/24 1346

## 2024-10-20 NOTE — ED Notes (Signed)
 Pt agitated at this time, stating he wants to talk to a doctor. Pt now in the restroom yelling that he is irritable.

## 2024-10-20 NOTE — ED Notes (Signed)
 Patient given a snack

## 2024-10-20 NOTE — ED Notes (Addendum)
 Pt given a lunch tray

## 2024-10-20 NOTE — ED Notes (Signed)
 RN responded to pt after banging on door. Pt was requesting food. RN offered snacks to pt. Pt refused snack and demanded food. Pt informed that he was sleeping when snack initially came around but I can get him something to eat now. Pt stated that he did not want a snack, he wanted food. Pt then asked for a supervisor.

## 2024-10-20 NOTE — ED Notes (Signed)
 Pt continues to shout at staff. Pt threatening to assault staff. Pt stating that he will kill everyone in here. MD Tan made aware to put pt under IVC paperwork.

## 2024-10-20 NOTE — ED Notes (Signed)
 VOL/Inpatient psychiatric admission

## 2024-10-20 NOTE — ED Notes (Signed)
 Pt  placed  under  IVC  papers  per  Dr  Waymond MD  informed  RN  VET

## 2024-10-20 NOTE — ED Notes (Signed)
 Pt shouting in day room, stating that he wants to leave. This RN informed pt that if he continues to shout and be aggressive, he will be ivc'd to stay.

## 2024-10-20 NOTE — Progress Notes (Signed)
 BICU is at full bed capacity today. Patient has been referred to the following facilities:  Service Provider Phone  [facility]  [phone]  [facility]  [phone]  [facility]  [phone]  [facility]  [phone]  [facility]  [phone]  [facility]  [phone]  [facility]  [phone]  [facility]  [phone]  [facility]  [phone]  [facility]  [phone]  [facility]  [phone]  [facility]  [phone]  [facility] Inpatient EFAX  [phone]  [facility]  [phone]  [facility]  [phone]  [facility]  [phone]  [facility]  [phone]  [facility]  [phone]  [facility] Center-Adult  [phone], [facility] [phone]

## 2024-10-20 NOTE — ED Notes (Signed)
 Pt agitated stating that no one knows what he is feeling, and no one cares. This RN informed pt that he has been faxed out to multiple places, and that we are waiting for a response back. This RN offered chaplain services to pt, and pt refused.

## 2024-10-20 NOTE — ED Notes (Signed)
 Pt states that he wants to leave. Pt is not under IVC, but if attempts to leave, will be IVC'd
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
## 2024-10-20 NOTE — ED Notes (Signed)
 IVC/  PENDING  PLACEMENT

## 2024-10-20 NOTE — ED Notes (Addendum)
Pt given phone 

## 2024-10-20 NOTE — ED Notes (Signed)
 RN taking over care of pt at this time after receiving report from outgoing RN. Pt currently resting in bed at this time. Pt ABCs intact. RR even and unlabored. Pt in NAD.

## 2024-10-21 MED ORDER — HALOPERIDOL 5 MG PO TABS
5.0000 mg | ORAL_TABLET | Freq: Two times a day (BID) | ORAL | Status: DC
  Administered 2024-10-22 – 2024-10-23 (×3): 5 mg via ORAL
  Filled 2024-10-21 (×3): qty 1

## 2024-10-21 NOTE — ED Notes (Signed)
 IVC/Pending Placement

## 2024-10-21 NOTE — ED Notes (Signed)
 Meal provided

## 2024-10-21 NOTE — Progress Notes (Signed)
 Per TTS, referred patient to the following facilities:  Service Provider Phone  [facility]  [phone]  [facility]  [phone]  [facility]  [phone]  [facility]  [phone]  [facility]  [phone]  [facility]  [phone]  [facility]  [phone]  [facility] Behavior[facility]  [phone]  [facility]  [phone]  [facility]  [phone]  [facility]  [phone]  [facility]  [phone]  [facility] Inpatient EFAX  [phone]  [facility]  [phone]  [facility]  [phone]  [facility]  [phone]  [facility]  [phone]  [facility]  [phone]  [facility]  [phone]  [facility]FAX  [address], [facility] [phone]

## 2024-10-21 NOTE — ED Provider Notes (Signed)
 Emergency Medicine Observation Re-evaluation Note  Peter Wilson is a 48 y.o. male, seen on rounds today.  Pt initially presented to the ED for complaints of Psychiatric Evaluation Currently, the patient is resting.  Physical Exam  BP (!) 146/93   Pulse 65   Temp 98.3 F (36.8 C)   Resp 17   Ht 6' 1 (1.854 m)   SpO2 96%   BMI 22.16 kg/m  Physical Exam .Gen:  No acute distress Resp:  Breathing easily and comfortably, no accessory muscle usage Neuro:  Moving all four extremities, no gross focal neuro deficits Psych:  Resting currently, calm when awake   ED Course / MDM  EKG:   I have reviewed the labs performed to date as well as medications administered while in observation.  Recent changes in the last 24 hours include agitation and aggressiveness requiring IM Geodon ..  Plan  Current plan is for psych dispo.    Waymond Lorelle Cummins, MD 10/21/24 [phone]

## 2024-10-21 NOTE — ED Notes (Signed)
Snack provided for pt

## 2024-10-21 NOTE — ED Notes (Signed)
 Pt in dayroom watching tv.  Pt calm and cooperative.

## 2024-10-21 NOTE — ED Notes (Signed)
 Meal tray provided. Tray checked for potential hazards. Pt denies no other needs at this time.

## 2024-10-21 NOTE — BH Assessment (Signed)
 Pt referral re-faxed to alternative facilities for placement. Destination  Service Provider Request Status Services Address Phone Fax Patient Preferred  [facility]  Pending - Request Sent -- [address], [place] [address] [phone] --  [facility]  Pending - Request Sent -- [address] [place] [address] [phone] --  [facility]  Pending - Request Sent -- [address] [place], [place] [address] [phone] --  [facility]  Pending - Request Sent -- [address] [place] [address] [phone] --  [facility]  Pending - Request Sent -- [address], [place] [address] [phone] --  [facility]  Pending - Request Sent -- [address] [place] [address] [phone] --  [facility]  Pending - Request Sent -- [address], [place] [address] [phone] --  [facility]  Pending - Request Sent -- [place] [phone] --  [facility]  Pending - Request Sent -- [address] [place] [address] [phone] --  [facility]  Pending - Request Sent -- [address] [place] [address] [phone] --  [facility]  Pending - Request Sent -- [address] [place] [address] [phone] --  [facility]  Pending - Request Sent -- [address] [place] [address] [phone] --  [facility]  Pending - Request Sent -- [place] [phone] --  [facility]  Pending - Request Sent -- [address] [place] [address] [phone] --  [facility]  Pending -  Request Sent -- [address] [place], [place] [address] [phone] --  [facility]  Pending - Request Sent -- [address] [place] [address] [phone] --  [facility]  Pending - Request Sent -- [address], [place] [address] [phone] --  [facility]  Pending - Request Sent -- [address] [place] [address] [phone] --  [facility]  Pending - Request Sent -- [address] [place] [address] [phone] --  [facility]  Pending - Request Sent -- [address] [place], [place] [phone] --

## 2024-10-21 NOTE — ED Notes (Signed)
 Pt provided with ginger ale and offered sandwich and chips, pt refused food but took beverage. Pt cursing at staff, demanding food.

## 2024-10-21 NOTE — ED Notes (Signed)
 Pt awake, requesting to speak to the supervisor. Pt now banging on the door and window in locked unit with cast trying to break it down.
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
## 2024-10-22 DIAGNOSIS — F39 Unspecified mood [affective] disorder: Secondary | ICD-10-CM

## 2024-10-22 MED ORDER — BENZTROPINE MESYLATE 1 MG PO TABS
1.0000 mg | ORAL_TABLET | Freq: Two times a day (BID) | ORAL | Status: DC
  Administered 2024-10-22 – 2024-10-23 (×3): 1 mg via ORAL
  Filled 2024-10-22 (×3): qty 1

## 2024-10-22 NOTE — Progress Notes (Signed)
   10/22/24 1330  Spiritual Encounters  Type of Visit Follow up  Care provided to: Patient  Reason for visit Routine spiritual support  OnCall Visit No   Chaplain was on the Unit and patient stopped her to confirm Chaplain would meet with him tomorrow evening to discuss Scripture.  Chaplain confirmed that she'd be there.    Rev. Rana M. Nicholaus, M.Div. Chaplain Resident [facility]

## 2024-10-22 NOTE — ED Notes (Signed)
 IVC/  PENDING  PLACEMENT

## 2024-10-22 NOTE — ED Notes (Signed)
 Meal tray provided. Tray checked for potential hazards. Pt denies no other needs at this time.
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
## 2024-10-22 NOTE — ED Notes (Signed)
 Pt speaking to chaplain at this time.
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
## 2024-10-22 NOTE — ED Notes (Signed)
 Pt requesting pain meds for right arm.  Meds given.

## 2024-10-22 NOTE — ED Notes (Signed)
 Pt given lunch tray.

## 2024-10-22 NOTE — ED Notes (Signed)
 IVC pending placement

## 2024-10-22 NOTE — ED Notes (Signed)
 Meds given and a snack given to pt.

## 2024-10-22 NOTE — ED Provider Notes (Signed)
 Emergency Medicine Observation Re-evaluation Note  Peter Wilson is a 48 y.o. male, seen on rounds today.  Pt initially presented to the ED for complaints of Psychiatric Evaluation  Currently, the patient is calm, no acute complaints.  Physical Exam  Blood pressure (!) 123/56, pulse 75, temperature 98 F (36.7 C), resp. rate 18, height 6' 1 (1.854 m), SpO2 96%. Physical Exam General: NAD Lungs: CTAB Psych: not agitated  ED Course / MDM  EKG:    I have reviewed the labs performed to date as well as medications administered while in observation.  Recent changes in the last 24 hours include no acute events overnight.  Cogentin  ordered  Plan  Current plan is for inpatient psych placement. Patient is under full IVC at this time.   Viviann Pastor, MD 10/22/24 [phone]

## 2024-10-22 NOTE — ED Notes (Signed)
 Patient provided with cereal and milk per request. Resting in bed reading a book, no other needs stated at this time.

## 2024-10-22 NOTE — ED Notes (Signed)
 Pt given safe shower supplies at this time. Pt is calm and cooperative.

## 2024-10-22 NOTE — ED Notes (Signed)
 Pt given snack and drink at this time. Pt is well appearing. Pt denies no other needs at this time.
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
## 2024-10-23 DIAGNOSIS — R45850 Homicidal ideations: Secondary | ICD-10-CM

## 2024-10-23 DIAGNOSIS — R449 Unspecified symptoms and signs involving general sensations and perceptions: Secondary | ICD-10-CM

## 2024-10-23 NOTE — ED Provider Notes (Signed)
 Psychiatry has seen this patient and evaluated and states that he is cleared for discharge.  Dr. Jadapalle has rescinded the IVC.  Patient reevaluated and alert and oriented x 3 and feels ready for discharge.  All questions answered he will continue his outpatient medications.  At time of discharge there is no evidence of acute life, limb, vision, or fertility threat. Patient has stable vital signs, pain is well controlled, patient is ambulatory and p.o. tolerant.  Discharge instructions were completed using the EPIC system. I would refer you to those at this time. All warnings prescriptions follow-up etc. were discussed in detail with the patient. Patient indicates understanding and is agreeable with this plan. All questions answered.  Patient is made aware that they may return to the emergency department for any worsening or new condition or for any other emergency.    Nicholaus Rolland BRAVO, MD 10/23/24 [phone]

## 2024-10-23 NOTE — Consult Note (Signed)
 [facility] Psychiatric Consult Follow-up  Patient Name: .Peter Wilson  MRN: [id number]  DOB: [birth year]  Consult Order details:  Orders (From admission, onward)     Start     Ordered   10/19/24 0326  CONSULT TO CALL ACT TEAM       Ordering Provider: Cyrena Mylar, MD  Provider:  (Not yet assigned)  Question:  Reason for Consult?  Answer:  Psych consult   10/19/24 0325   10/19/24 0326  IP CONSULT TO PSYCHIATRY       Ordering Provider: Cyrena Mylar, MD  Provider:  (Not yet assigned)  Question:  Reason for consult:  Answer:  Medication management   10/19/24 0325   10/19/24 0326  IP CONSULT TO PSYCHIATRY       Ordering Provider: Cyrena Mylar, MD  Provider:  (Not yet assigned)  Question:  Reason for consult:  Answer:  Medication management   10/19/24 0325             Mode of Visit: In person    Psychiatry Consult Evaluation  Service Date: October 23, 2024 LOS:  LOS: 0 days  Chief Complaint I'm ready to go  Primary Psychiatric Diagnoses  Unspecified mood disorder   Assessment  Peter Wilson is a 48 y.o. male admitted: Presented to the ED  10/23/2024:  Patient has been seen by this provider today.  Patient has been monitored closely since 10/19/2024.  It is noted that patient's hostile behavior is in the context of antisocial personality rather than impulsive as he is able to control his actions when he gets what he wants.  There is no primary psychiatric presentation that can explain his hostile behavior except for antisocial personality.  He has consistently denied SI/HI/plan/intent with this provider and also Jodie Sharps nurse practitioner.  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  He remains future oriented and is willing to take his medications and follow-up with outpatient mental health services on discharge.  Inpatient hospitalization will be a highly restrictive setting at this time.  Patient is psychiatrically  cleared for discharge.  IVC has been rescinded by this provider.      Diagnoses:  Active Hospital problems: Active Problems:   Unspecified mood (affective) disorder   Homicidal ideations   Hallucinations    Plan   ## Psychiatric Medication Recommendations:  Continue cogentin  1 mg 2 times daily Haldol  5 mg 2 times daily   ## Medical Decision Making Capacity: Not specifically addressed in this encounter  ## Further Work-up:   EKG ordered today -- Pertinent labwork reviewed earlier this admission includes: cmp, ethanol, cbc, urine drug screen   ## Disposition:--No psychiatric contraindication for discharge today.  IVC has been rescinded and patient is psychiatrically cleared for discharge  ## Behavioral / Environmental: -To minimize splitting of staff, assign one staff person to communicate all information from the team when feasible. or Utilize compassion and acknowledge the patient's experiences while setting clear and realistic expectations for care.    ## Safety and Observation Level:  - Based on my clinical evaluation, I estimate the patient to be at low risk of self harm in the current setting. - At this time, we recommend  routine. This decision is based on my review of the chart including patient's history and current presentation, interview of the patient, mental status examination, and consideration of suicide risk including evaluating suicidal ideation, plan, intent, suicidal or self-harm behaviors, risk factors, and  protective factors. This judgment is based on our ability to directly address suicide risk, implement suicide prevention strategies, and develop a safety plan while the patient is in the clinical setting. Please contact our team if there is a concern that risk level has changed.  CSSR Risk Category:C-SSRS RISK CATEGORY: No Risk  Suicide Risk Assessment: Patient has following modifiable risk factors for suicide: recklessness, which we are addressing by  initiating medication management and continuing to search for inpatient psychiatric unit. Patient has following non-modifiable or demographic risk factors for suicide: male gender and psychiatric hospitalization Patient has the following protective factors against suicide: no history of suicide attempts and no history of NSSIB  Thank you for this consult request. Recommendations have been communicated to the primary team.  We will sign off at this time Alfrieda Tarry.MD    History of Present Illness  Relevant Aspects of Hospital ED   Patient Report:   10/23/24: On interview today patient reports doing well and requested for discharge.  He reports being remorseful about his anger outbursts and his threats towards his cousin.  He reports that medication is helping him and feels much calmer.  Per nursing report patient has been taking his medications and displayed safe behaviors with no property destruction.  Patient consistently denies SI/HI/plan and denies auditory/visual hallucinations.  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  He reports that he will be leaving [place] in next 24 hours to go to [place].  He is willing to get his prescriptions for Haldol  filled out.  Patient is not meeting IVC criteria at this time   Psych ROS:  Depression: Denied Anxiety:  Denied Mania (lifetime and current): Denied Psychosis: (lifetime and current): Reported history of schizophrenia      Psychiatric and Social History  Psychiatric History:  Information collected from IRIS documentation on initial presentation  Prior documented psychiatric history Schizoaffective Disorder, Bipolar Type, Aggressive Behaviors, Schizophrenic Disorder, PTSD, Substance Abuse, and Anti-social personality disorder and malingering.   Multiple prior psychiatric hospitalizations No current outpatient treatment Medication non-compliance Prior  medications in chart noted: Olanzapine , Haldol , Invega , Abilify , Depakote , Prazosin , Trazodone , Hydroxyzine , Haldol  Decanoate        Per chart review, patient [facility] at [facility] ED 09/03/2024- 09/05/2024 for homicidal ideations towards his cousin. Was discharged to [facility] on Haldol  5mg  po BID.  Otherwise as per HPI above. Exam Findings  Physical Exam: Deferred to EDP- note reviewed   Vital Signs:  Temp:  [97.9 F (36.6 C)-98 F (36.7 C)] 97.9 F (36.6 C) (10/30 0722) Pulse Rate:  [69-76] 76 (10/30 0722) Resp:  [18] 18 (10/30 0722) BP: (105-147)/(64-81) 105/64 (10/30 0722) SpO2:  [98 %-99 %] 99 % (10/30 0722) Blood pressure 105/64, pulse 76, temperature 97.9 F (36.6 C), temperature source Oral, resp. rate 18, height 6' 1 (1.854 m), SpO2 99%. Body mass index is 22.16 kg/m.    Mental Status Exam: General Appearance: Casual  Orientation:  Full (Time, Place, and Person)  Memory:  Immediate;   Fair Recent;   Fair Remote;   Fair  Concentration:  Concentration: Fair and Attention Span: Fair  Recall:  Fair  Attention  Fair  Eye Contact:  Fair  Speech:  Normal Rate  Language:  Fair  Volume: normal  Mood:stable  Affect:  Congruent  Thought Process:  Coherent and Linear  Thought Content:  discharge focused  Suicidal Thoughts:  No  Homicidal Thoughts:  No  Judgement:  improved  Insight:  improved  Psychomotor Activity:  Normal  Akathisia:  No  Fund of Knowledge:  Fair      Assets:  [employer]  Cognition:  WNL  ADL's:  Intact  AIMS (if indicated):        Other History   These have been pulled in through the EMR, reviewed, and updated if appropriate.  Family History:  The patient's family history is not on file.  Medical History: Past Medical History:  Diagnosis Date   Marijuana abuse    Schizophrenia, paranoid (HCC)    Tardive dyskinesia    Tobacco abuse     Surgical History: Past Surgical History:  Procedure  Laterality Date   NO PAST SURGERIES       Medications:   Current Facility-Administered Medications:    acetaminophen  (TYLENOL ) tablet 650 mg, 650 mg, Oral, Q6H PRN, Tan, Ting Xu, MD, 650 mg at 10/23/24 0827   benztropine  (COGENTIN ) tablet 1 mg, 1 mg, Oral, BID, Viviann Pastor, MD, 1 mg at 10/23/24 0827   diphenhydrAMINE  (BENADRYL ) capsule 50 mg, 50 mg, Oral, Q6H PRN **OR** diphenhydrAMINE  (BENADRYL ) injection 50 mg, 50 mg, Intramuscular, Q6H PRN, Hunt, Katlin E, NP, 50 mg at 10/20/24 1344   haloperidol  (HALDOL ) tablet 5 mg, 5 mg, Oral, Q6H PRN, 5 mg at 10/22/24 1948 **OR** haloperidol  lactate (HALDOL ) injection 5 mg, 5 mg, Intramuscular, Q6H PRN, Hunt, Katlin E, NP, 5 mg at 10/20/24 1344   haloperidol  (HALDOL ) tablet 5 mg, 5 mg, Oral, BID, Smith, Annie B, NP, 5 mg at 10/23/24 0827   LORazepam  (ATIVAN ) tablet 1 mg, 1 mg, Oral, Q6H PRN, Hunt, Katlin E, NP, 1 mg at 10/23/24 0827   oxyCODONE  (Oxy IR/ROXICODONE ) immediate release tablet 5 mg, 5 mg, Oral, Q6H PRN, Tan, Ting Xu, MD, 5 mg at 10/23/24 0827  Current Outpatient Medications:    ARIPiprazole  (ABILIFY ) 30 MG tablet, Take 1 tablet (30 mg total) by mouth daily. (Patient not taking: Reported on 10/19/2024), Disp: 15 tablet, Rfl: 0   benztropine  (COGENTIN ) 1 MG tablet, Take 1 tablet (1 mg total) by mouth 2 (two) times daily. (Patient not taking: Reported on 10/19/2024), Disp: 15 tablet, Rfl: 0   divalproex  (DEPAKOTE  ER) 500 MG 24 hr tablet, Take 1 tablet (500 mg total) by mouth 2 (two) times daily. (Patient not taking: Reported on 10/19/2024), Disp: 30 tablet, Rfl: 0   ferrous sulfate  325 (65 FE) MG EC tablet, Take 1 tablet (325 mg total) by mouth daily with breakfast., Disp: 90 tablet, Rfl: 0   haloperidol  decanoate (HALDOL  DECANOATE) 100 MG/ML injection, Inject 100 mg into the muscle every 28 (twenty-eight) days. (Patient not taking: Reported on 10/19/2024), Disp: , Rfl:    hydrOXYzine  (ATARAX ) 25 MG tablet, Take 1 tablet (25 mg total) by  mouth every 8 (eight) hours as needed. (Patient not taking: Reported on 10/19/2024), Disp: 30 tablet, Rfl: 0   OLANZapine  (ZYPREXA ) 10 MG tablet, Take 1 tablet (10 mg total) by mouth at bedtime. (Patient not taking: Reported on 10/19/2024), Disp: 15 tablet, Rfl: 0   prazosin  (MINIPRESS ) 1 MG capsule, Take 1 capsule (1 mg total) by mouth at bedtime. (Patient not taking: Reported on 10/19/2024), Disp: 15 capsule, Rfl: 0   traZODone  (DESYREL ) 50 MG tablet, Take 1 tablet (50 mg total) by mouth at bedtime. (Patient not taking: Reported on 10/19/2024), Disp: 15 tablet, Rfl: 0  Allergies: Allergies  Allergen Reactions   Chlorpromazine  Anaphylaxis and Other (See Comments)  scarred my arm   Ibuprofen  Hives, Itching and Rash    I don't know   Morphine Anaphylaxis and Nausea And Vomiting   Penicillins Hives, Rash and Anaphylaxis    Did it involve swelling of the face/tongue/throat, SOB, or low BP? U  Did it involve sudden or severe rash/hives, skin peeling, or any reaction on the inside of your mouth or nose? Y   Did you need to seek medical attention at a hospital or doctor's office? Y  When did it last happen?    childhood  If all above answers are NO, may proceed with cephalosporin use.    Tracey Hermance.MD

## 2024-10-23 NOTE — ED Notes (Signed)
 Meal tray provided. Tray checked for potential hazards. Pt denies no other needs at this time.

## 2024-10-23 NOTE — ED Notes (Signed)
 Meal provided

## 2024-10-23 NOTE — ED Notes (Signed)
 Pt awake due to another pt on the unit being awake and loud.  Meds requested and given to pt to help pt sleep.  Pt cooperative.

## 2024-10-23 NOTE — ED Notes (Signed)
 ivc/pending placement.Marland Kitchen

## 2024-10-23 NOTE — ED Notes (Addendum)
 Pt discharged at this time. RN reviewed discharge instructions with pt. Pt verbalized understanding. Vital signs taken. RR even and unlabored. Pt denies any questions or needs at this time. Pt ambulatory at discharge. Pt given bus pass.

## 2024-10-23 NOTE — Discharge Instructions (Signed)

## 2024-10-23 NOTE — ED Notes (Signed)
rescinded 

## 2024-10-23 NOTE — Progress Notes (Signed)
 Patient has been seen by this provider today.  Patient has been monitored closely since 10/19/2024.  It is noted that patient's hostile behavior is in the context of antisocial personality rather than impulsive as he is able to control his actions when he gets what he wants.  There is no primary psychiatric presentation that can explain his hostile behavior except for antisocial personality.  He has consistently denied SI/HI/plan/intent with this provider and also Jodie Sharps nurse practitioner.  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  He remains future oriented and is willing to take his medications and follow-up with outpatient mental health services on discharge.  Inpatient hospitalization will be a highly restrictive setting at this time.  Patient is psychiatrically cleared for discharge.  IVC has been rescinded by this provider.

## 2024-10-23 NOTE — ED Provider Notes (Signed)
 ED Boarding Reassessment Note - Psychiatric Patient Time/Date: 7:15 AM on 10/23/2024 Provider: Rolland Moats, MD    Event Date/Time   First MD Initiated Contact with Patient 10/23/24 [phone]     (approximate)  Reason for Re-evaluation: Continued ED stay pending inpatient psychiatric placement.  Interval History: Patient remains in the ED awaiting psychiatric bed. Reports no change] in mood. Overnight events: [none Patient reports sleepingeatingparticipating in care]  Objective:  Vitals:   10/22/24 0903 10/22/24 1733  BP: (!) 123/56 (!) 147/81  Pulse: 75 69  Resp: 18 18  Temp: 98 F (36.7 C) 98 F (36.7 C)  SpO2: 96% 98%    Appearance: Cooperative  Physical Exam: [unchanged] - General: NAD - Cardiac:Warm and Well Perfused - Lungs: No Respiratory Distress  Data Reviewed: Labs Reviewed  CBC - Abnormal; Notable for the following components:      Result Value   WBC 3.9 (*)    RBC 3.88 (*)    Hemoglobin 11.3 (*)    HCT 35.8 (*)    All other components within normal limits  URINE DRUG SCREEN, QUALITATIVE ([facility] ONLY) - Abnormal; Notable for the following components:   Cannabinoid 50 Ng, Ur [place] POSITIVE (*)    All other components within normal limits  COMPREHENSIVE METABOLIC PANEL WITH GFR  ETHANOL    [New labs / imaging / EKG reviewed - none pending] [Consults or updates from psych or case management]: Patient has not been accepted to a facility  Assessment: medically stable, awaiting psychiatric placement. No acute medical changes identified.  Plan: - Continue current observation level: - Continue current psychiatric and medical medications. - PRN medications for agitation - Maintain safety precautions. - Reassess PRN or if behavior changes. - Remains in ED pending inpatient psychiatric bed.  Attending Role / Disposition: Patient remains under ED physician care pending physical transfer. Psychiatry team involved.  ED continues to provide active monitoring and  management as needed.    Moats Rolland BRAVO, MD 10/23/24 [phone]
